# Patient Record
Sex: Female | Born: 1945 | Race: White | Hispanic: No | Marital: Married | Smoking: Never smoker
Health system: Southern US, Community
[De-identification: ages and names within clinical notes are randomized; demographics above are authoritative.]

## PROBLEM LIST (undated history)

## (undated) DIAGNOSIS — R053 Chronic cough: Secondary | ICD-10-CM

## (undated) DIAGNOSIS — L719 Rosacea, unspecified: Secondary | ICD-10-CM

## (undated) DIAGNOSIS — D649 Anemia, unspecified: Secondary | ICD-10-CM

## (undated) DIAGNOSIS — J849 Interstitial pulmonary disease, unspecified: Secondary | ICD-10-CM

## (undated) DIAGNOSIS — E119 Type 2 diabetes mellitus without complications: Secondary | ICD-10-CM

## (undated) DIAGNOSIS — R42 Dizziness and giddiness: Secondary | ICD-10-CM

## (undated) DIAGNOSIS — R112 Nausea with vomiting, unspecified: Secondary | ICD-10-CM

## (undated) DIAGNOSIS — M1990 Unspecified osteoarthritis, unspecified site: Secondary | ICD-10-CM

## (undated) DIAGNOSIS — F32A Depression, unspecified: Secondary | ICD-10-CM

## (undated) DIAGNOSIS — I10 Essential (primary) hypertension: Secondary | ICD-10-CM

## (undated) DIAGNOSIS — E669 Obesity, unspecified: Secondary | ICD-10-CM

## (undated) DIAGNOSIS — R0600 Dyspnea, unspecified: Secondary | ICD-10-CM

## (undated) DIAGNOSIS — J189 Pneumonia, unspecified organism: Secondary | ICD-10-CM

## (undated) DIAGNOSIS — Z9889 Other specified postprocedural states: Secondary | ICD-10-CM

## (undated) HISTORY — PX: CHOLECYSTECTOMY: SHX55

## (undated) HISTORY — PX: HERNIA REPAIR: SHX51

## (undated) HISTORY — PX: TOTAL KNEE ARTHROPLASTY: SHX125

## (undated) HISTORY — PX: OTHER SURGICAL HISTORY: SHX169

## (undated) HISTORY — PX: JOINT REPLACEMENT: SHX530

---

## 1983-02-26 HISTORY — PX: CARPAL TUNNEL RELEASE: SHX101

## 2006-02-25 HISTORY — PX: VENTRAL HERNIA REPAIR: SHX424

## 2008-02-26 HISTORY — PX: LAPAROSCOPIC GASTRIC BANDING: SHX1100

## 2011-12-27 HISTORY — PX: DILATION AND CURETTAGE OF UTERUS: SHX78

## 2012-01-22 ENCOUNTER — Ambulatory Visit: Payer: Self-pay | Admitting: Obstetrics and Gynecology

## 2012-01-22 LAB — BASIC METABOLIC PANEL
Anion Gap: 4 — ABNORMAL LOW (ref 7–16)
BUN: 13 mg/dL (ref 7–18)
Chloride: 100 mmol/L (ref 98–107)
Co2: 32 mmol/L (ref 21–32)
Creatinine: 0.63 mg/dL (ref 0.60–1.30)
EGFR (Non-African Amer.): >60
Osmolality: 276 (ref 275–301)
Potassium: 4 mmol/L (ref 3.5–5.1)
Sodium: 136 mmol/L (ref 136–145)

## 2012-01-22 LAB — CBC
HCT: 44 % (ref 35.0–47.0)
HGB: 14.3 g/dL (ref 12.0–16.0)
MCV: 86 fL (ref 80–100)
Platelet: 228 10*3/uL (ref 150–440)
RBC: 5.15 10*6/uL (ref 3.80–5.20)
RDW: 14.1 % (ref 11.5–14.5)
WBC: 5.2 10*3/uL (ref 3.6–11.0)

## 2012-01-27 ENCOUNTER — Ambulatory Visit: Payer: Self-pay | Admitting: Obstetrics and Gynecology

## 2012-01-29 LAB — PATHOLOGY REPORT

## 2012-02-18 ENCOUNTER — Ambulatory Visit: Payer: Self-pay | Admitting: Gynecologic Oncology

## 2012-02-25 ENCOUNTER — Ambulatory Visit: Payer: Self-pay | Admitting: Internal Medicine

## 2012-02-26 ENCOUNTER — Ambulatory Visit: Payer: Self-pay | Admitting: Gynecologic Oncology

## 2012-10-13 ENCOUNTER — Ambulatory Visit: Payer: Self-pay | Admitting: Surgery

## 2012-10-13 LAB — CREATININE, SERUM: EGFR (Non-African Amer.): >60

## 2012-10-19 ENCOUNTER — Ambulatory Visit: Payer: Self-pay | Admitting: Surgery

## 2012-11-05 DIAGNOSIS — R1033 Periumbilical pain: Secondary | ICD-10-CM | POA: Diagnosis not present

## 2013-01-06 DIAGNOSIS — M1710 Unilateral primary osteoarthritis, unspecified knee: Secondary | ICD-10-CM | POA: Diagnosis not present

## 2013-01-06 DIAGNOSIS — Z23 Encounter for immunization: Secondary | ICD-10-CM | POA: Diagnosis not present

## 2013-01-06 DIAGNOSIS — Z0100 Encounter for examination of eyes and vision without abnormal findings: Secondary | ICD-10-CM | POA: Diagnosis not present

## 2013-01-06 DIAGNOSIS — I10 Essential (primary) hypertension: Secondary | ICD-10-CM | POA: Diagnosis not present

## 2013-01-06 DIAGNOSIS — E119 Type 2 diabetes mellitus without complications: Secondary | ICD-10-CM | POA: Diagnosis not present

## 2013-01-06 DIAGNOSIS — Z0000 Encounter for general adult medical examination without abnormal findings: Secondary | ICD-10-CM | POA: Diagnosis not present

## 2013-09-08 DIAGNOSIS — S335XXA Sprain of ligaments of lumbar spine, initial encounter: Secondary | ICD-10-CM | POA: Diagnosis not present

## 2013-09-08 DIAGNOSIS — E119 Type 2 diabetes mellitus without complications: Secondary | ICD-10-CM | POA: Diagnosis not present

## 2013-09-08 DIAGNOSIS — I10 Essential (primary) hypertension: Secondary | ICD-10-CM | POA: Diagnosis not present

## 2013-09-10 DIAGNOSIS — H2510 Age-related nuclear cataract, unspecified eye: Secondary | ICD-10-CM | POA: Diagnosis not present

## 2013-11-23 ENCOUNTER — Ambulatory Visit: Payer: Self-pay | Admitting: Internal Medicine

## 2013-11-23 DIAGNOSIS — Z1231 Encounter for screening mammogram for malignant neoplasm of breast: Secondary | ICD-10-CM | POA: Diagnosis not present

## 2014-03-11 DIAGNOSIS — N390 Urinary tract infection, site not specified: Secondary | ICD-10-CM | POA: Diagnosis not present

## 2014-03-11 DIAGNOSIS — I10 Essential (primary) hypertension: Secondary | ICD-10-CM | POA: Diagnosis not present

## 2014-03-11 DIAGNOSIS — Z09 Encounter for follow-up examination after completed treatment for conditions other than malignant neoplasm: Secondary | ICD-10-CM | POA: Diagnosis not present

## 2014-03-11 DIAGNOSIS — E1121 Type 2 diabetes mellitus with diabetic nephropathy: Secondary | ICD-10-CM | POA: Diagnosis not present

## 2014-03-11 DIAGNOSIS — Z23 Encounter for immunization: Secondary | ICD-10-CM | POA: Diagnosis not present

## 2014-03-11 DIAGNOSIS — M170 Bilateral primary osteoarthritis of knee: Secondary | ICD-10-CM | POA: Diagnosis not present

## 2014-06-14 NOTE — Op Note (Signed)
PATIENT NAME:  Megan Shields, Megan Shields MR#:  [id number] DATE OF BIRTH:  [birth year]  DATE OF PROCEDURE:  01/27/2012  PREOPERATIVE DIAGNOSIS: Postmenopausal bleeding.   POSTOPERATIVE DIAGNOSES:  1. Postmenopausal bleeding.  2. Submucosal fibroid.   PROCEDURES PERFORMED:  1. Diagnostic hysteroscopy.  2. Dilation and curettage of the endometrium.   SURGEON: Alanda Slim. DeFrancesco, MD   FIRST ASSISTANT: None.   ANESTHESIA: General.   INDICATIONS: The patient is a 69 year old married white female, para 0, with a history of postmenopausal bleeding. Previously the patient had undergone a hysteroscopy with dilation and curettage in [place] for a similar problem with benign pathology findings.   FINDINGS AT SURGERY: White female with BMI of 55. Pelvis was narrow with a narrow pubic arch and convergent sidewalls. Proximal vagina was narrow. Cervix was without lesions. The uterus was midplane and size was difficult to assess due to body habitus. The uterus did sound to 11 cm. There was what appeared to be a submucosal fibroid on hysteroscopy and there was lush endometrium noted behind this submucosal mass.   DESCRIPTION OF PROCEDURE: The patient was brought to the operating room where she was placed in the supine position. General anesthesia was induced without difficulty. She was placed in the dorsal lithotomy position using the bumblebee stirrups. A Betadine perineal, intravaginal prep and drape was performed in the standard fashion. A red Robinson catheter was used to drain 100 mL of urine from the bladder. A Sims speculum was placed into the vagina to facilitate exposure. The anterior lip of the cervix was grasped with a single-tooth tenaculum. The uterus was initially sounded to 6 cm. Hanks dilators up to a #20 [place] caliber were used to dilate the endocervical canal. After completing the cervical dilation, the hysteroscope was placed with lactated Ringer's as irrigant to identify the intrauterine  findings. Eventually there was a central submucosal mass that once gotten around the fundus was seen with some lush endometrium being present. Smooth and serrated curettes were used to curettage the endometrial cavity. Tissue was sent to pathology. Upon completion of the D and C, repeat hysteroscopy revealed no significant tissue left behind with adequate sampling being obtained. The procedure was then terminated with all instrumentation being removed from the vagina. The patient was then awakened, mobilized, and taken to the recovery room in satisfactory condition. Estimated blood loss was minimal. Complications were none. All instruments, needles, and sponge counts were verified as correct.   ____________________________ Alanda Slim. DeFrancesco, MD mad:drc D: 01/27/2012 16:10:55 ET T: 01/28/2012 10:01:39 ET JOB#: [id number]  cc: Hassell Done A. DeFrancesco, MD, <Dictator> [facility] Alanda Slim DEFRANCESCO MD ELECTRONICALLY SIGNED 01/28/2012 23:21

## 2014-07-18 DIAGNOSIS — L718 Other rosacea: Secondary | ICD-10-CM | POA: Diagnosis not present

## 2014-07-18 DIAGNOSIS — M170 Bilateral primary osteoarthritis of knee: Secondary | ICD-10-CM | POA: Diagnosis not present

## 2014-07-18 DIAGNOSIS — L821 Other seborrheic keratosis: Secondary | ICD-10-CM | POA: Diagnosis not present

## 2014-07-18 DIAGNOSIS — M2011 Hallux valgus (acquired), right foot: Secondary | ICD-10-CM | POA: Diagnosis not present

## 2014-07-18 DIAGNOSIS — D225 Melanocytic nevi of trunk: Secondary | ICD-10-CM | POA: Diagnosis not present

## 2014-07-18 DIAGNOSIS — Z872 Personal history of diseases of the skin and subcutaneous tissue: Secondary | ICD-10-CM | POA: Diagnosis not present

## 2014-07-18 DIAGNOSIS — L739 Follicular disorder, unspecified: Secondary | ICD-10-CM | POA: Diagnosis not present

## 2014-07-18 DIAGNOSIS — I10 Essential (primary) hypertension: Secondary | ICD-10-CM | POA: Diagnosis not present

## 2014-07-18 DIAGNOSIS — E1121 Type 2 diabetes mellitus with diabetic nephropathy: Secondary | ICD-10-CM | POA: Diagnosis not present

## 2014-07-18 DIAGNOSIS — Z1283 Encounter for screening for malignant neoplasm of skin: Secondary | ICD-10-CM | POA: Diagnosis not present

## 2014-07-18 DIAGNOSIS — D485 Neoplasm of uncertain behavior of skin: Secondary | ICD-10-CM | POA: Diagnosis not present

## 2014-07-26 DIAGNOSIS — M79671 Pain in right foot: Secondary | ICD-10-CM | POA: Diagnosis not present

## 2014-07-26 DIAGNOSIS — M2011 Hallux valgus (acquired), right foot: Secondary | ICD-10-CM | POA: Diagnosis not present

## 2014-08-01 ENCOUNTER — Other Ambulatory Visit: Payer: Self-pay | Admitting: Internal Medicine

## 2014-08-01 ENCOUNTER — Encounter: Payer: Self-pay | Admitting: Internal Medicine

## 2014-08-01 DIAGNOSIS — Z9884 Bariatric surgery status: Secondary | ICD-10-CM | POA: Insufficient documentation

## 2014-08-01 DIAGNOSIS — M1710 Unilateral primary osteoarthritis, unspecified knee: Secondary | ICD-10-CM | POA: Insufficient documentation

## 2014-08-01 DIAGNOSIS — E1129 Type 2 diabetes mellitus with other diabetic kidney complication: Secondary | ICD-10-CM | POA: Insufficient documentation

## 2014-08-01 DIAGNOSIS — M179 Osteoarthritis of knee, unspecified: Secondary | ICD-10-CM | POA: Insufficient documentation

## 2014-08-01 DIAGNOSIS — S39012A Strain of muscle, fascia and tendon of lower back, initial encounter: Secondary | ICD-10-CM | POA: Insufficient documentation

## 2014-08-01 DIAGNOSIS — I10 Essential (primary) hypertension: Secondary | ICD-10-CM | POA: Insufficient documentation

## 2014-08-04 ENCOUNTER — Encounter: Payer: Self-pay | Admitting: *Deleted

## 2014-08-10 ENCOUNTER — Encounter
Admission: RE | Admit: 2014-08-10 | Discharge: 2014-08-10 | Disposition: A | Discharge status: Home / Self Care | Payer: Medicare Other | Source: Ambulatory Visit | Attending: Podiatry | Admitting: Podiatry

## 2014-08-10 DIAGNOSIS — Z01810 Encounter for preprocedural cardiovascular examination: Secondary | ICD-10-CM | POA: Diagnosis not present

## 2014-08-10 DIAGNOSIS — I10 Essential (primary) hypertension: Secondary | ICD-10-CM | POA: Diagnosis not present

## 2014-08-10 DIAGNOSIS — M2011 Hallux valgus (acquired), right foot: Secondary | ICD-10-CM | POA: Diagnosis not present

## 2014-08-10 DIAGNOSIS — Z01812 Encounter for preprocedural laboratory examination: Secondary | ICD-10-CM | POA: Insufficient documentation

## 2014-08-10 DIAGNOSIS — R030 Elevated blood-pressure reading, without diagnosis of hypertension: Secondary | ICD-10-CM | POA: Diagnosis not present

## 2014-08-10 HISTORY — DX: Obesity, unspecified: E66.9

## 2014-08-10 LAB — BASIC METABOLIC PANEL
ANION GAP: 9 (ref 5–15)
BUN: 16 mg/dL (ref 6–20)
CHLORIDE: 100 mmol/L — AB (ref 101–111)
CO2: 30 mmol/L (ref 22–32)
CREATININE: 0.86 mg/dL (ref 0.44–1.00)
Calcium: 9.5 mg/dL (ref 8.9–10.3)
GFR calc Af Amer: >60 mL/min (ref >60)
GFR calc non Af Amer: >60 mL/min (ref >60)
GLUCOSE: 140 mg/dL — AB (ref 65–99)
Potassium: 3.8 mmol/L (ref 3.5–5.1)
SODIUM: 139 mmol/L (ref 135–145)

## 2014-08-10 NOTE — Patient Instructions (Signed)
  Your procedure is scheduled on: 09/02/14 Fri    Report to Day Surgery. To find out your arrival time please call [phone] between 1PM - 3PM on 09/01/14 Thurs.  Remember: Instructions that are not followed completely may result in serious medical risk, up to and including death, or upon the discretion of your surgeon and anesthesiologist your surgery may need to be rescheduled.    _x___ 1. Do not eat food or drink liquids after midnight. No gum chewing or hard candies.     ____ 2. No Alcohol for 24 hours before or after surgery.   ____ 3. Bring all medications with you on the day of surgery if instructed.    __x__ 4. Notify your doctor if there is any change in your medical condition     (cold, fever, infections).     Do not wear jewelry, make-up, hairpins, clips or nail polish.  Do not wear lotions, powders, or perfumes. You may wear deodorant.  Do not shave 48 hours prior to surgery. Men may shave face and neck.  Do not bring valuables to the hospital.    [facility] is not responsible for any belongings or valuables.               Contacts, dentures or bridgework may not be worn into surgery.  Leave your suitcase in the car. After surgery it may be brought to your room.  For patients admitted to the hospital, discharge time is determined by your                treatment team.   Patients discharged the day of surgery will not be allowed to drive home.   Please read over the following fact sheets that you were given:     _ Take these medicines the morning of surgery with A SIP OF WATER:    1.   2.   3.   4.  5.  6.  ____ Fleet Enema (as directed)   __x__ Use CHG Soap as directed  ____ Use inhalers on the day of surgery  _x___ Stop metformin 2 days prior to surgery    ____ Take 1/2 of usual insulin dose the night before surgery and none on the morning of surgery.   ____ Stop Coumadin/Plavix/aspirin on   ____ Stop Anti-inflammatories on    ____ Stop supplements  until after surgery.    ____ Bring C-Pap to the hospital.

## 2014-08-11 NOTE — OR Nursing (Signed)
Ekg reviewed by Dr Myra Gianotti and ok

## 2014-08-17 ENCOUNTER — Ambulatory Visit: Admission: RE | Admit: 2014-08-17 | Payer: Medicare Other | Source: Ambulatory Visit | Admitting: Podiatry

## 2014-08-17 HISTORY — DX: Type 2 diabetes mellitus without complications: E11.9

## 2014-08-17 HISTORY — DX: Unspecified osteoarthritis, unspecified site: M19.90

## 2014-08-17 HISTORY — DX: Other specified postprocedural states: Z98.890

## 2014-08-17 HISTORY — DX: Nausea with vomiting, unspecified: R11.2

## 2014-08-17 HISTORY — DX: Essential (primary) hypertension: I10

## 2014-08-17 SURGERY — CORRECTION, HALLUX VALGUS
Anesthesia: Regional | Laterality: Right

## 2014-09-02 ENCOUNTER — Ambulatory Visit
Admission: RE | Admit: 2014-09-02 | Discharge: 2014-09-02 | Disposition: A | Discharge status: Home / Self Care | Payer: Medicare Other | Source: Ambulatory Visit | Attending: Podiatry | Admitting: Podiatry

## 2014-09-02 ENCOUNTER — Ambulatory Visit: Payer: Medicare Other | Admitting: Anesthesiology

## 2014-09-02 ENCOUNTER — Encounter
Admission: RE | Disposition: A | Discharge status: Home / Self Care | Payer: Self-pay | Source: Ambulatory Visit | Attending: Podiatry

## 2014-09-02 ENCOUNTER — Encounter: Payer: Self-pay | Admitting: *Deleted

## 2014-09-02 DIAGNOSIS — I10 Essential (primary) hypertension: Secondary | ICD-10-CM | POA: Insufficient documentation

## 2014-09-02 DIAGNOSIS — N289 Disorder of kidney and ureter, unspecified: Secondary | ICD-10-CM | POA: Insufficient documentation

## 2014-09-02 DIAGNOSIS — E669 Obesity, unspecified: Secondary | ICD-10-CM | POA: Diagnosis not present

## 2014-09-02 DIAGNOSIS — Z6841 Body Mass Index (BMI) 40.0 and over, adult: Secondary | ICD-10-CM | POA: Insufficient documentation

## 2014-09-02 DIAGNOSIS — M2011 Hallux valgus (acquired), right foot: Secondary | ICD-10-CM | POA: Insufficient documentation

## 2014-09-02 DIAGNOSIS — E119 Type 2 diabetes mellitus without complications: Secondary | ICD-10-CM | POA: Insufficient documentation

## 2014-09-02 DIAGNOSIS — M79671 Pain in right foot: Secondary | ICD-10-CM | POA: Diagnosis not present

## 2014-09-02 DIAGNOSIS — G8918 Other acute postprocedural pain: Secondary | ICD-10-CM | POA: Diagnosis not present

## 2014-09-02 HISTORY — PX: HALLUX VALGUS AUSTIN: SHX6623

## 2014-09-02 LAB — GLUCOSE, CAPILLARY
Glucose-Capillary: 128 mg/dL — ABNORMAL HIGH (ref 65–99)
Glucose-Capillary: 168 mg/dL — ABNORMAL HIGH (ref 65–99)

## 2014-09-02 SURGERY — CORRECTION, HALLUX VALGUS
Anesthesia: Regional | Laterality: Right

## 2014-09-02 MED ORDER — ROPIVACAINE HCL 5 MG/ML IJ SOLN
INTRAMUSCULAR | Status: DC | PRN
  Administered 2014-09-02: 10 mL via EPIDURAL
  Administered 2014-09-02 (×2): 5 mL via EPIDURAL

## 2014-09-02 MED ORDER — BUPIVACAINE HCL (PF) 0.5 % IJ SOLN
INTRAMUSCULAR | Status: AC
  Filled 2014-09-02: qty 30

## 2014-09-02 MED ORDER — OXYCODONE HCL 5 MG PO TABS
5.0000 mg | ORAL_TABLET | Freq: Once | ORAL | Status: AC | PRN
  Administered 2014-09-02: 5 mg via ORAL

## 2014-09-02 MED ORDER — BUPIVACAINE HCL (PF) 0.25 % IJ SOLN
INTRAMUSCULAR | Status: AC
  Filled 2014-09-02: qty 30

## 2014-09-02 MED ORDER — CEFAZOLIN SODIUM-DEXTROSE 2-3 GM-% IV SOLR
2.0000 g | Freq: Once | INTRAVENOUS | Status: AC
  Administered 2014-09-02: 2 g via INTRAVENOUS

## 2014-09-02 MED ORDER — ONDANSETRON HCL 4 MG/2ML IJ SOLN
INTRAMUSCULAR | Status: DC | PRN
  Administered 2014-09-02: 4 mg via INTRAVENOUS

## 2014-09-02 MED ORDER — BUPIVACAINE HCL 0.25 % IJ SOLN
INTRAMUSCULAR | Status: DC | PRN
  Administered 2014-09-02: 10 mL

## 2014-09-02 MED ORDER — ROPIVACAINE HCL 5 MG/ML IJ SOLN
INTRAMUSCULAR | Status: AC
  Filled 2014-09-02: qty 40

## 2014-09-02 MED ORDER — OXYCODONE HCL 5 MG/5ML PO SOLN: 5.0000 mg | Freq: Once | ORAL | Status: AC | PRN

## 2014-09-02 MED ORDER — OXYCODONE HCL 5 MG PO TABS
ORAL_TABLET | ORAL | Status: AC
  Filled 2014-09-02: qty 1

## 2014-09-02 MED ORDER — DEXAMETHASONE SODIUM PHOSPHATE 4 MG/ML IJ SOLN
INTRAMUSCULAR | Status: DC | PRN
  Administered 2014-09-02: 10 mg via INTRAVENOUS

## 2014-09-02 MED ORDER — PHENYLEPHRINE HCL 10 MG/ML IJ SOLN
INTRAMUSCULAR | Status: DC | PRN
  Administered 2014-09-02 (×2): 100 ug via INTRAVENOUS

## 2014-09-02 MED ORDER — HYDROCODONE-ACETAMINOPHEN 5-325 MG PO TABS: 1.0000 | ORAL_TABLET | Freq: Four times a day (QID) | ORAL | Status: DC | PRN

## 2014-09-02 MED ORDER — SODIUM CHLORIDE 0.9 % IR SOLN
Status: DC | PRN
  Administered 2014-09-02: 200 mL

## 2014-09-02 MED ORDER — FENTANYL CITRATE (PF) 100 MCG/2ML IJ SOLN
25.0000 ug | INTRAMUSCULAR | Status: DC | PRN
  Administered 2014-09-02: 50 ug via INTRAVENOUS
  Administered 2014-09-02 (×2): 25 ug via INTRAVENOUS

## 2014-09-02 MED ORDER — SODIUM CHLORIDE 0.9 % IV SOLN
INTRAVENOUS | Status: DC
  Administered 2014-09-02: 07:00:00 via INTRAVENOUS

## 2014-09-02 MED ORDER — MIDAZOLAM HCL 2 MG/2ML IJ SOLN
INTRAMUSCULAR | Status: DC | PRN
  Administered 2014-09-02 (×2): 1 mg via INTRAVENOUS

## 2014-09-02 MED ORDER — HYDROCODONE-ACETAMINOPHEN 5-325 MG PO TABS
ORAL_TABLET | ORAL | Status: AC
  Filled 2014-09-02: qty 1

## 2014-09-02 MED ORDER — FAMOTIDINE 20 MG PO TABS
20.0000 mg | ORAL_TABLET | Freq: Once | ORAL | Status: AC
  Administered 2014-09-02: 20 mg via ORAL

## 2014-09-02 MED ORDER — PROPOFOL 10 MG/ML IV BOLUS
INTRAVENOUS | Status: DC | PRN
  Administered 2014-09-02: 200 mg via INTRAVENOUS

## 2014-09-02 MED ORDER — SUCCINYLCHOLINE CHLORIDE 20 MG/ML IJ SOLN
INTRAMUSCULAR | Status: DC | PRN
  Administered 2014-09-02: 200 mg via INTRAVENOUS

## 2014-09-02 SURGICAL SUPPLY — 48 items
0.062 k- wire ×2 IMPLANT
BANDAGE ELASTIC 4 CLIP NS LF (GAUZE/BANDAGES/DRESSINGS) ×2 IMPLANT
BANDAGE GZE STRCH 3 X5YDS N/ST (GAUZE/BANDAGES/DRESSINGS) ×2 IMPLANT
BANDAGE STRETCH 3X4.1 STRL (GAUZE/BANDAGES/DRESSINGS) ×2 IMPLANT
BLADE MED AGGRESSIVE (BLADE) ×2 IMPLANT
BLADE OSC/SAGITTAL MD 5.5X18 (BLADE) IMPLANT
BLADE OSC/SAGITTAL MD 9X18.5 (BLADE) IMPLANT
BLADE OSCILLATING/SAGITTAL (BLADE)
BLADE SURG 15 STRL LF DISP TIS (BLADE) ×2 IMPLANT
BLADE SURG 15 STRL SS (BLADE) ×2
BLADE SURG MINI STRL (BLADE) ×2 IMPLANT
BLADE SW THK.38XMED LNG THN (BLADE) IMPLANT
BNDG ESMARK 4X12 TAN STRL LF (GAUZE/BANDAGES/DRESSINGS) ×2 IMPLANT
BNDG STRETCH 4X75 STRL LF (GAUZE/BANDAGES/DRESSINGS) ×2 IMPLANT
CANISTER SUCT 1200ML W/VALVE (MISCELLANEOUS) ×2 IMPLANT
CHLORAPREP   10.5 W/. TINT ×2 IMPLANT
DRAPE FLUOR MINI C-ARM 54X84 (DRAPES) ×2 IMPLANT
DURAPREP 26ML APPLICATOR (WOUND CARE) ×2 IMPLANT
GAUZE PETRO XEROFOAM 1X8 (MISCELLANEOUS) ×2 IMPLANT
GAUZE SPONGE 4X4 12PLY STRL (GAUZE/BANDAGES/DRESSINGS) ×2 IMPLANT
GAUZE XEROFORM 4X4 STRL (GAUZE/BANDAGES/DRESSINGS) ×2 IMPLANT
GLOVE BIO SURGEON STRL SZ7.5 (GLOVE) ×8 IMPLANT
GLOVE INDICATOR 8.0 STRL GRN (GLOVE) ×4 IMPLANT
GOWN STRL REUS W/ TWL LRG LVL3 (GOWN DISPOSABLE) ×2 IMPLANT
GOWN STRL REUS W/TWL LRG LVL3 (GOWN DISPOSABLE) ×2
KIT RM TURNOVER STRD PROC AR (KITS) ×2 IMPLANT
LABEL OR SOLS (LABEL) ×2 IMPLANT
MONOCRYL ×2 IMPLANT
NDL SAFETY 25GX1.5 (NEEDLE) IMPLANT
NS IRRIG 500ML POUR BTL (IV SOLUTION) ×2 IMPLANT
PAD GROUND ADULT SPLIT (MISCELLANEOUS) ×2 IMPLANT
PENCIL ELECTRO HAND CTR (MISCELLANEOUS) IMPLANT
PREP CHG 10.5 TEAL (MISCELLANEOUS) ×2 IMPLANT
RASP SM TEAR CROSS CUT (RASP) ×2 IMPLANT
SPLINT CAST 1 STEP 5X30 WHT (MISCELLANEOUS) ×2 IMPLANT
STOCKINETTE M/LG 89821 (MISCELLANEOUS) ×2 IMPLANT
STOCKINETTE STRL 6IN 960660 (GAUZE/BANDAGES/DRESSINGS) IMPLANT
STRIP CLOSURE SKIN 1/4X4 (GAUZE/BANDAGES/DRESSINGS) ×2 IMPLANT
SUT ETHILON 5-0 FS-2 18 BLK (SUTURE) ×2 IMPLANT
SUT MNCRL+ 5-0 UNDYED PC-3 (SUTURE) ×1 IMPLANT
SUT MONOCRYL 5-0 (SUTURE) ×1
SUT VIC AB 4-0 FS2 27 (SUTURE) ×2 IMPLANT
SUT VICRYL+ 3-0 27IN RB-1 (SUTURE) ×2 IMPLANT
SWABSTK COMLB BENZOIN TINCTURE (MISCELLANEOUS) ×2 IMPLANT
SYRINGE 10CC LL (SYRINGE) IMPLANT
VICRYL  VCP 215 ×2 IMPLANT
WIRE Z .045 C-WIRE SPADE TIP (WIRE) ×2 IMPLANT
WIRE Z .062 C-WIRE SPADE TIP (WIRE) ×2 IMPLANT

## 2014-09-02 NOTE — Anesthesia Preprocedure Evaluation (Signed)
Anesthesia Evaluation  Patient identified by MRN, date of birth, ID band Patient awake    Reviewed: Allergy & Precautions, H&P , NPO status , Patient's Chart, lab work & pertinent test results, reviewed documented beta blocker date and time   History of Anesthesia Complications (+) PONV and history of anesthetic complications  Airway Mallampati: II  TM Distance: >3 FB Neck ROM: full    Dental  (+) Chipped   Pulmonary neg pulmonary ROS, neg COPD breath sounds clear to auscultation  Pulmonary exam normal       Cardiovascular Exercise Tolerance: Good hypertension, - Past MI Normal cardiovascular examRhythm:regular Rate:Normal     Neuro/Psych  Neuromuscular disease negative psych ROS   GI/Hepatic negative GI ROS, Neg liver ROS,   Endo/Other  diabetes, Well Controlled, Type 2, Oral Hypoglycemic Agents  Renal/GU Renal disease  negative genitourinary   Musculoskeletal negative musculoskeletal ROS (+)   Abdominal   Peds negative pediatric ROS (+)  Hematology negative hematology ROS (+)   Anesthesia Other Findings Past Medical History:   Hypertension                                                 Arthritis                                                    PONV (postoperative nausea and vomiting)                     Diabetes mellitus without complication                       Obesity                                                      Reproductive/Obstetrics negative OB ROS                             Anesthesia Physical Anesthesia Plan  ASA: III  Anesthesia Plan: General LMA and Regional   Post-op Pain Management: MAC Combined w/ Regional for Post-op pain   Induction:   Airway Management Planned:   Additional Equipment:   Intra-op Plan:   Post-operative Plan:   Informed Consent: I have reviewed the patients History and Physical, chart, labs and discussed the procedure including  the risks, benefits and alternatives for the proposed anesthesia with the patient or authorized representative who has indicated his/her understanding and acceptance.   Dental Advisory Given  Plan Discussed with: Anesthesiologist, CRNA and Surgeon  Anesthesia Plan Comments:         Anesthesia Quick Evaluation

## 2014-09-02 NOTE — Anesthesia Procedure Notes (Addendum)
Procedure Name: LMA Insertion Date/Time: 09/02/2014 7:37 AM Performed by: Silvana Newness Pre-anesthesia Checklist: Patient identified, Emergency Drugs available, Suction available, Patient being monitored and Timeout performed Patient Re-evaluated:Patient Re-evaluated prior to inductionOxygen Delivery Method: Circle system utilized Preoxygenation: Pre-oxygenation with 100% oxygen Intubation Type: IV induction Ventilation: Mask ventilation without difficulty LMA: LMA inserted LMA Size: 4.0 Number of attempts: 1 Placement Confirmation: positive ETCO2 and breath sounds checked- equal and bilateral Tube secured with: Tape Dental Injury: Teeth and Oropharynx as per pre-operative assessment  Comments: LMA removed at 0740 due to not reaching adequate tidal volumes.    Procedure Name: Intubation Date/Time: 09/02/2014 7:41 AM Performed by: Silvana Newness Pre-anesthesia Checklist: Patient identified, Emergency Drugs available, Suction available, Patient being monitored and Timeout performed Patient Re-evaluated:Patient Re-evaluated prior to inductionOxygen Delivery Method: Circle system utilized Preoxygenation: Pre-oxygenation with 100% oxygen Intubation Type: IV induction Ventilation: Mask ventilation without difficulty Laryngoscope Size: Mac and 3 Grade View: Grade I Tube type: Oral Tube size: 7.0 mm Number of attempts: 1 Placement Confirmation: ETT inserted through vocal cords under direct vision,  positive ETCO2 and breath sounds checked- equal and bilateral Secured at: 21 cm Tube secured with: Tape Dental Injury: Teeth and Oropharynx as per pre-operative assessment     Anesthesia Regional Block:  Popliteal block  Pre-Anesthetic Checklist: ,, timeout performed, Correct Patient, Correct Site, Correct Laterality, Correct Procedure, Correct Position, site marked, Risks and benefits discussed,  Surgical consent,  Pre-op evaluation,  At surgeon's request and post-op pain  management  Laterality: Right and Lower  Prep: chloraprep       Needles:  Injection technique: Single-shot  Needle Type: Echogenic Needle     Needle Length: 9cm 9 cm Needle Gauge: 21 and 21 G    Additional Needles:  Procedures: nerve stimulator Popliteal block Narrative:  Start time: 09/02/2014 7:25 AM End time: 09/02/2014 7:32 AM Injection made incrementally with aspirations every 5 mL.  Performed by: Personally  Anesthesiologist: Katy Fitch K  Additional Notes: Ultrasound guided, picture not saved, nerve stimulator used to verify no motor response with stimulation less than 0.5 mA.  No paresthesia during procedure.  Patient tolerated procedure well with no immediate complications.

## 2014-09-02 NOTE — Anesthesia Postprocedure Evaluation (Signed)
  Anesthesia Post-op Note  Patient: Megan Shields  Procedure(s) Performed: Procedure(s): HALLUX VALGUS AUSTIN (Right)  Anesthesia type:General LMA, Regional  Patient location: PACU  Post pain: Pain level controlled  Post assessment: Post-op Vital signs reviewed, Patient's Cardiovascular Status Stable, Respiratory Function Stable, Patent Airway and No signs of Nausea or vomiting  Post vital signs: Reviewed and stable  Last Vitals:  Filed Vitals:   09/02/14 0848  BP: 148/90  Pulse: 79  Temp: 37.3 C  Resp: 19    Level of consciousness: awake, alert  and patient cooperative  Complications: No apparent anesthesia complications

## 2014-09-02 NOTE — Discharge Instructions (Addendum)
[place] [address] [place]   1. Take your medication as prescribed.  Pain medication should be taken only as needed.  2. Keep the dressing clean, dry and intact.  3. Keep your foot elevated above the heart level for the first 48 hours.  4. Walking to the bathroom and brief periods of walking are acceptable, unless we have instructed you to be non-weight bearing.  5. Always wear your post-op shoe when walking.  Always use your crutches if you are to be non-weight bearing.  6. Do not take a shower. Baths are permissible as long as the foot is kept out of the water.   7. Every hour you are awake:  - Bend your knee 15 times. - Flex foot 15 times - Massage calf 15 times  8. Call [facility] [phone]) if any of the following problems occur: - You develop a temperature or fever. - The bandage becomes saturated with blood. - Medication does not stop your pain. - Injury of the foot occurs. - Any symptoms of infection including redness, odor, or red streaks running from wound.        9.   If your were given a popliteal block (numbing from the knee down) you should use a cane or walker and avoid weight to the operative leg until feeling has returned to you foot. AMBULATORY SURGERY  DISCHARGE INSTRUCTIONS   1) The drugs that you were given will stay in your system until tomorrow so for the next 24 hours you should not:  A) Drive an automobile B) Make any legal decisions C) Drink any alcoholic beverage   2) You may resume regular meals tomorrow.  Today it is better to start with liquids and gradually work up to solid foods.  You may eat anything you prefer, but it is better to start with liquids, then soup and crackers, and gradually work up to solid foods.   3) Please notify your doctor immediately if you have any unusual bleeding, trouble breathing,  redness and pain at the surgery site, drainage, fever, or pain not relieved by medication.    4) Additional Instructions:  Please contact your physician with any problems or Same Day Surgery at [phone], Monday through Friday 6 am to 4 pm, or [place] at [facility] number at [phone].

## 2014-09-02 NOTE — H&P (Signed)
HISTORY AND PHYSICAL INTERVAL NOTE:  09/02/2014  7:40 AM  Megan Shields  has presented today for surgery, with the diagnosis of Julian.  The various methods of treatment have been discussed with the patient.  No guarantees were given.  After consideration of risks, benefits and other options for treatment, the patient has consented to surgery.  I have reviewed the patients' chart and labs.    Patient Vitals for the past 24 hrs:  BP Temp Temp src Pulse Resp SpO2 Height Weight  09/02/14 0626 (!) 145/83 mmHg 98.1 F (36.7 C) Oral 70 16 99 % '5\' 4"'$  (1.626 m) 136.079 kg (300 lb)    A history and physical examination was performed in my office.  The patient was reexamined.  There have been no changes to this history and physical examination.  Samara Deist A

## 2014-09-02 NOTE — Transfer of Care (Signed)
Immediate Anesthesia Transfer of Care Note  Patient: Megan Shields  Procedure(s) Performed: Procedure(s): HALLUX VALGUS AUSTIN (Right)  Patient Location: PACU  Anesthesia Type:General and Regional  Level of Consciousness: awake, alert , oriented and patient cooperative  Airway & Oxygen Therapy: Patient Spontanous Breathing and Patient connected to face mask oxygen  Post-op Assessment: Report given to RN, Post -op Vital signs reviewed and stable and Patient moving all extremities X 4  Post vital signs: Reviewed and stable  Last Vitals:  Filed Vitals:   09/02/14 0848  BP: 148/90  Pulse: 79  Temp: 37.3 C  Resp: 19    Complications: No apparent anesthesia complications

## 2014-09-02 NOTE — Op Note (Signed)
Operative note   Surgeon:Rayona Sardinha Lawyer: None    Preop diagnosis: Hallux valgus right foot    Postop diagnosis: Same    Procedure: [place] hallux valgus correction right foot    EBL: Minimal    Anesthesia:regional and general    Hemostasis: Ankle tourniquet inflated to 250 mmHg for 45 minutes    Specimen: None    Complications: None    Operative indications: 69 year old female with complaint of a painful hallux valgus deformity on her right foot. Undergone conservative treatment and presents today for surgery. The risks benefits alternatives and competitions associated have been discussed and the patient has consented for surgery.    Procedure:  Patient was brought into the OR and placed on the operating table in thesupine position. After anesthesia was obtained theright lower extremity was prepped and draped in usual sterile fashion.  Attention was directed to the dorsal medial first MTPJ where a an incision was performed. Sharp and blunt dissection was carried down to the capsule. A T capsulotomy was then performed. The prominent dorsal medial eminence was noted and transected with a power saw. The joint was evaluated on the head of the metatarsal and base of the proximal phalanx. No severe articular damage was noted. No severe spurring was noted with the exception of a mild dorsal spur on the first metatarsal head. This was removed with a rongeur and smoothed with a power rasp. Care was taken to evaluate the lateral aspect of the MTPJ and no obvious spurring was noted. Next a V osteotomy was created in the head of the metatarsal was translocated laterally. Then stabilized with a 0.062 K wire. Good alignment was noted on fluoroscopy. The K wire was cut and bent appropriately. The ensuing overhanging ledge was then transected. Areas were smoothed with a power rasp. Layered closure was performed with a 3-0 Vicryl with a small capsulorrhaphy to the medial aspect. 4-0 Vicryl for  the subcutaneous tissue and a 5-0 Monocryl for skin. 0.25% Marcaine was then infiltrated around the lesion site. Overall the patient tolerated the procedure and anesthesia well. She was transported from the OR to the PACU with all vital signs stable and vascular status intact. She'll be discharged in an OrthoWedge shoe. Her in the outpatient clinic in 5-7 days.    Patient tolerated the procedure and anesthesia well.  Was transported from the OR to the PACU with all vital signs stable and vascular status intact. To be discharged per routine protocol.  Will follow up in approximately 1 week in the outpatient clinic.

## 2014-09-07 DIAGNOSIS — M79671 Pain in right foot: Secondary | ICD-10-CM | POA: Diagnosis not present

## 2014-09-12 DIAGNOSIS — M79671 Pain in right foot: Secondary | ICD-10-CM | POA: Diagnosis not present

## 2014-10-19 DIAGNOSIS — M79671 Pain in right foot: Secondary | ICD-10-CM | POA: Diagnosis not present

## 2014-10-21 DIAGNOSIS — M79671 Pain in right foot: Secondary | ICD-10-CM | POA: Diagnosis not present

## 2014-10-21 DIAGNOSIS — T8584XD Pain due to internal prosthetic devices, implants and grafts, not elsewhere classified, subsequent encounter: Secondary | ICD-10-CM | POA: Diagnosis not present

## 2014-10-30 DIAGNOSIS — M62838 Other muscle spasm: Secondary | ICD-10-CM | POA: Diagnosis not present

## 2014-11-18 ENCOUNTER — Ambulatory Visit: Payer: Self-pay | Admitting: Internal Medicine

## 2014-12-19 DIAGNOSIS — M79671 Pain in right foot: Secondary | ICD-10-CM | POA: Diagnosis not present

## 2015-01-23 ENCOUNTER — Encounter: Payer: Self-pay | Admitting: Internal Medicine

## 2015-01-23 ENCOUNTER — Ambulatory Visit (INDEPENDENT_AMBULATORY_CARE_PROVIDER_SITE_OTHER): Payer: Medicare Other | Admitting: Internal Medicine

## 2015-01-23 VITALS — BP 128/80 | HR 72 | Wt 306.0 lb

## 2015-01-23 DIAGNOSIS — B373 Candidiasis of vulva and vagina: Secondary | ICD-10-CM

## 2015-01-23 DIAGNOSIS — B3731 Acute candidiasis of vulva and vagina: Secondary | ICD-10-CM

## 2015-01-23 DIAGNOSIS — S39012D Strain of muscle, fascia and tendon of lower back, subsequent encounter: Secondary | ICD-10-CM

## 2015-01-23 MED ORDER — FLUCONAZOLE 150 MG PO TABS: 150.0000 mg | ORAL_TABLET | Freq: Once | ORAL | Status: DC

## 2015-01-23 MED ORDER — CYCLOBENZAPRINE HCL 10 MG PO TABS: 10.0000 mg | ORAL_TABLET | Freq: Every day | ORAL | Status: DC

## 2015-01-23 NOTE — Progress Notes (Signed)
Date:  01/23/2015   Name:  Megan Shields   DOB:  1946-01-30   MRN:  [id number]   Chief Complaint: Rash  patient complains of a rash very pruritic in the groin and labia. She spent a day shopping wearing a panty liner. That evening she noticed some mild itching and woke up in the night scratching. Since then the itching has progressed with spreading redness over her groin and inner thighs labia and mons pubis. She is been using over-the-counter clotrimazole cream with some benefit. She denies vaginal itching or bleeding. No dysuria. No previous history of yeast infections.   Review of Systems  Constitutional: Negative for fever and chills.  Genitourinary: Negative for dysuria, vaginal bleeding, vaginal pain and pelvic pain.  Skin: Positive for color change and rash.    Patient Active Problem List   Diagnosis Date Noted  . Type II diabetes mellitus with renal manifestations ([place]) 08/01/2014  . Essential (primary) hypertension 08/01/2014  . 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 Arthritis of knee, degenerative 08/01/2014  . Low back strain 08/01/2014    Prior to Admission medications   Medication Sig Start Date End Date Taking? Authorizing Provider  acetaminophen (TYLENOL) 500 MG tablet Take 1,000 mg by mouth at bedtime as needed.    Historical Provider, MD  celecoxib (CELEBREX) 200 MG capsule Take 1 capsule by mouth daily. AM 03/11/14   Historical Provider, MD  cyclobenzaprine (FLEXERIL) 10 MG tablet Take 1 tablet by mouth at bedtime. 09/08/13   Historical Provider, MD  glucose blood test strip Place 1 each onto the skin 2 (two) times daily. 12/03/13   Historical Provider, MD  HYDROcodone-acetaminophen (NORCO) 5-325 MG per tablet Take 1 tablet by mouth every 6 (six) hours as needed for moderate pain. 09/02/14   Samara Deist, DPM  metFORMIN (GLUCOPHAGE) 500 MG tablet Take 1 tablet by mouth 2 (two) times daily. 09/16/13   Historical Provider, MD  nystatin cream (MYCOSTATIN) Apply 1 application  topically 2 (two) times daily. 01/06/13   Historical Provider, MD  valsartan-hydrochlorothiazide (DIOVAN-HCT) 160-25 MG per tablet Take 1 tablet by mouth daily. AM 05/04/14   Historical Provider, MD    Allergies  Allergen Reactions  . Codeine     Other reaction(s): drowsiness    Past Surgical History  Procedure Laterality Date  . Laparoscopic gastric banding  2010    Lap Band  . Ventral hernia repair  2008  . Cholecystectomy    . Total knee arthroplasty Bilateral 2003, 2004  . Carpal tunnel release Bilateral 1985  . Dilation and curettage of uterus  12/2011    benign endometrial polyp  . Hernia repair    . Lymph node removed Right   . Total knee arthroplasty Bilateral   . Hallux valgus [place] Right 09/02/2014    Procedure: HALLUX VALGUS AUSTIN;  Surgeon: Samara Deist, DPM;  Location: [facility] ORS;  Service: Podiatry;  Laterality: Right;    Social History  Substance Use Topics  . Smoking status: Never Smoker   . Smokeless tobacco: None  . Alcohol Use: No    Medication list has been reviewed and updated.   Physical Exam  Constitutional: She is oriented to person, place, and time. She appears well-developed and well-nourished. No distress.  HENT:  Head: Normocephalic and atraumatic.  Eyes: Right eye exhibits no discharge. Left eye exhibits no discharge. No scleral icterus.  Pulmonary/Chest: Effort normal. No respiratory distress.  Abdominal: Soft.  Genitourinary:     Musculoskeletal: Normal range of motion.  Neurological: She is alert and oriented to person, place, and time.  Skin: Skin is warm and dry. No rash noted.  Psychiatric: She has a normal mood and affect. Her behavior is normal. Thought content normal.  Nursing note and vitals reviewed.   BP 128/80 mmHg  Pulse 72  Wt 306 lb (138.801 kg)  Assessment and Plan: 1. Yeast vaginitis Continue Clotrimazole; Benedryl at night to reduce itching - fluconazole (DIFLUCAN) 150 MG tablet; Take 1 tablet (150 mg total) by  mouth once.  Dispense: 7 tablet; Refill: 0  2. Low back strain, subsequent encounter Continue flexeril PRN - cyclobenzaprine (FLEXERIL) 10 MG tablet; Take 1 tablet (10 mg total) by mouth at bedtime.  Dispense: 30 tablet; Refill: [place], MD [facility]  01/23/2015

## 2015-02-27 ENCOUNTER — Encounter: Payer: Self-pay | Admitting: *Deleted

## 2015-02-27 ENCOUNTER — Ambulatory Visit
Admission: EM | Admit: 2015-02-27 | Discharge: 2015-02-27 | Disposition: A | Discharge status: Home / Self Care | Payer: Medicare Other | Attending: Family Medicine | Admitting: Family Medicine

## 2015-02-27 DIAGNOSIS — R21 Rash and other nonspecific skin eruption: Secondary | ICD-10-CM

## 2015-02-27 MED ORDER — PREDNISONE 20 MG PO TABS: 20.0000 mg | ORAL_TABLET | Freq: Every day | ORAL | Status: DC

## 2015-02-27 NOTE — ED Provider Notes (Signed)
CSN: [id number]     Arrival date & time 02/27/15  1516 History   First MD Initiated Contact with Patient 02/27/15 1752     Chief Complaint  Patient presents with  . Rash   (Consider location/radiation/quality/duration/timing/severity/associated sxs/prior Treatment) HPI Comments: 70 yo female with a 6 days h/o a rash that started on her anterior upper neck and seems to be worsening. Denies any wheezing, shortness of breath, facial swelling, tongue or throat swelling. States rash feels like its burning and slightly itchy. Denies using new cosmetics, soaps, detergents, plant contact, travel. States only new medication was amoxicillin about 2 weeks ago (2gm prior to dental procedure) and again about 8 days ago prior to second dental procedure. Rash started shortly after the second dose.   The history is provided by the patient.    Past Medical History  Diagnosis Date  . Hypertension   . Arthritis   . PONV (postoperative nausea and vomiting)   . Diabetes mellitus without complication ([place])   . Obesity    Past Surgical History  Procedure Laterality Date  . Laparoscopic gastric banding  2010    Lap Band  . Ventral hernia repair  2008  . Cholecystectomy    . Total knee arthroplasty Bilateral 2003, 2004  . Carpal tunnel release Bilateral 1985  . Dilation and curettage of uterus  12/2011    benign endometrial polyp  . Hernia repair    . Lymph node removed Right   . Total knee arthroplasty Bilateral   . Hallux valgus austin Right 09/02/2014    Procedure: HALLUX VALGUS AUSTIN;  Surgeon: Samara Deist, DPM;  Location: [facility] ORS;  Service: Podiatry;  Laterality: Right;   Family History  Problem Relation Age of Onset  . Breast cancer Mother    Social History  Substance Use Topics  . Smoking status: Never Smoker   . Smokeless tobacco: None  . Alcohol Use: No   OB History    No data available     Review of Systems  Allergies  Pneumococcal vaccines and Codeine  Home Medications    Prior to Admission medications   Medication Sig Start Date End Date Taking? Authorizing Provider  acetaminophen (TYLENOL) 500 MG tablet Take 1,000 mg by mouth at bedtime as needed.   Yes Historical Provider, MD  celecoxib (CELEBREX) 200 MG capsule Take 1 capsule by mouth daily. AM 03/11/14  Yes Historical Provider, MD  cyclobenzaprine (FLEXERIL) 10 MG tablet Take 1 tablet (10 mg total) by mouth at bedtime. 01/23/15  Yes Glean Hess, MD  glucose blood test strip Place 1 each onto the skin 2 (two) times daily. 12/03/13  Yes Historical Provider, MD  metFORMIN (GLUCOPHAGE) 500 MG tablet Take 1 tablet by mouth 2 (two) times daily. 09/16/13  Yes Historical Provider, MD  valsartan-hydrochlorothiazide (DIOVAN-HCT) 160-25 MG per tablet Take 1 tablet by mouth daily. AM 05/04/14  Yes Historical Provider, MD  fluconazole (DIFLUCAN) 150 MG tablet Take 1 tablet (150 mg total) by mouth once. 01/23/15   Glean Hess, MD  HYDROcodone-acetaminophen (NORCO) 5-325 MG per tablet Take 1 tablet by mouth every 6 (six) hours as needed for moderate pain. Patient not taking: Reported on 01/23/2015 09/02/14   Samara Deist, DPM  nystatin cream (MYCOSTATIN) Apply 1 application topically 2 (two) times daily. 01/06/13   Historical Provider, MD  predniSONE (DELTASONE) 20 MG tablet Take 1 tablet (20 mg total) by mouth daily. 02/27/15   Norval Gable, MD   Meds Ordered and Administered this  Visit  Medications - No data to display  BP 168/97 mmHg  Pulse 88  Temp(Src) 98.4 F (36.9 C) (Oral)  Resp 20  Ht '5\' 4"'$  (1.626 m)  Wt 305 lb (138.347 kg)  BMI 52.33 kg/m2  SpO2 98% No data found.   Physical Exam  Constitutional: She appears well-developed and well-nourished. No distress.  HENT:  Head: Normocephalic and atraumatic.  Right Ear: Tympanic membrane normal.  Left Ear: Tympanic membrane normal.  Nose: Rhinorrhea present.  Mouth/Throat: Uvula is midline and mucous membranes are normal. No oropharyngeal exudate,  posterior oropharyngeal edema or posterior oropharyngeal erythema.  Cardiovascular: Normal rate, regular rhythm and normal heart sounds.   Pulmonary/Chest: Effort normal and breath sounds normal. No respiratory distress. She has no wheezes. She has no rales.  Skin: Rash noted. She is not diaphoretic.  Diffuse, non-urticarial, non-papular, non-blanchable erythema on upper anterior neck, cheeks, nose and forehead; no discreet lesions or drainage  Nursing note and vitals reviewed.   ED Course  Procedures (including critical care time)  Labs Review Labs Reviewed - No data to display  Imaging Review No results found.   Visual Acuity Review  Right Eye Distance:   Left Eye Distance:   Bilateral Distance:    Right Eye Near:   Left Eye Near:    Bilateral Near:         MDM   1. Rash   (possibly secondary to recent drug-amoxicillin)  Discharge Medication List as of 02/27/2015  6:32 PM    START taking these medications   Details  predniSONE (DELTASONE) 20 MG tablet Take 1 tablet (20 mg total) by mouth daily., Starting 02/27/2015, Until Discontinued, Normal       1. diagnosis reviewed with patient 2. rx as per orders above; reviewed possible side effects, interactions, risks and benefits  3. Recommend supportive treatment with otc anti-histamines 4. Follow-up prn if symptoms worsen or don't improve    Norval Gable, MD 02/27/15 2055

## 2015-02-27 NOTE — ED Notes (Signed)
Patient has had a rash for 6 days starting on her neck. The rash had spread to the rest of her body and is now burning. Patient is not aware of how or why the rash started.

## 2015-03-03 ENCOUNTER — Other Ambulatory Visit: Payer: Self-pay | Admitting: Internal Medicine

## 2015-03-03 NOTE — Telephone Encounter (Signed)
pts coming in on 1/11

## 2015-03-08 ENCOUNTER — Encounter: Payer: Self-pay | Admitting: Internal Medicine

## 2015-03-08 ENCOUNTER — Other Ambulatory Visit: Payer: Self-pay | Admitting: Internal Medicine

## 2015-03-08 ENCOUNTER — Ambulatory Visit (INDEPENDENT_AMBULATORY_CARE_PROVIDER_SITE_OTHER): Payer: Medicare Other | Admitting: Internal Medicine

## 2015-03-08 VITALS — BP 136/84 | HR 76 | Ht 64.0 in | Wt 306.0 lb

## 2015-03-08 DIAGNOSIS — E1129 Type 2 diabetes mellitus with other diabetic kidney complication: Secondary | ICD-10-CM | POA: Diagnosis not present

## 2015-03-08 DIAGNOSIS — R21 Rash and other nonspecific skin eruption: Secondary | ICD-10-CM

## 2015-03-08 DIAGNOSIS — I10 Essential (primary) hypertension: Secondary | ICD-10-CM

## 2015-03-08 DIAGNOSIS — Z1239 Encounter for other screening for malignant neoplasm of breast: Secondary | ICD-10-CM

## 2015-03-08 MED ORDER — TRIAMCINOLONE ACETONIDE 0.1 % EX CREA: 1.0000 "application " | TOPICAL_CREAM | Freq: Two times a day (BID) | CUTANEOUS | Status: DC

## 2015-03-08 NOTE — Progress Notes (Signed)
Date:  03/08/2015   Name:  Megan Shields   DOB:  1946/02/15   MRN:  [id number]   Chief Complaint: Follow-up; Diabetes; Hypertension; and Rash Diabetes She presents for her follow-up diabetic visit. She has type 2 diabetes mellitus. Her disease course has been stable. Pertinent negatives for hypoglycemia include no dizziness, headaches or tremors. Pertinent negatives for diabetes include no chest pain and no fatigue. Current diabetic treatment includes oral agent (monotherapy). She is following a generally healthy diet. There is no change in her home blood glucose trend. Her breakfast blood glucose is taken between 6-7 am. Her breakfast blood glucose range is generally 130-140 mg/dl. An ACE inhibitor/angiotensin II receptor blocker is being taken. Eye exam is current.  Hypertension This is a chronic problem. The current episode started more than 1 year ago. The problem is unchanged. The problem is controlled. Pertinent negatives include no chest pain, headaches, palpitations or shortness of breath. Past treatments include angiotensin blockers and diuretics.  Rash This is a new problem. The current episode started in the past 7 days. The problem has been gradually improving since onset. The affected locations include the neck and face. The rash is characterized by itchiness and redness. Pertinent negatives include no diarrhea, fatigue, fever or shortness of breath. Past treatments include antihistamine and anti-itch cream. The treatment provided moderate relief.   The rash started after she took amoxicillin for dental prophylaxis. She is taking Benadryl and Zantac. She was prescribed prednisone taper by urgent care but declined to take it because it would run her blood sugar up.    Review of Systems  Constitutional: Negative for fever, chills and fatigue.  Eyes: Negative for visual disturbance.  Respiratory: Negative for chest tightness and shortness of breath.   Cardiovascular: Negative for  chest pain, palpitations and leg swelling.  Gastrointestinal: Negative for abdominal pain, diarrhea and constipation.  Genitourinary: Negative for dysuria, frequency, hematuria and decreased urine volume.  Skin: Positive for color change (redness of the labia - residual from yeast infection) and rash.  Neurological: Negative for dizziness, tremors and headaches.  Psychiatric/Behavioral: Negative for dysphoric mood.    Patient Active Problem List   Diagnosis Date Noted  . Type II diabetes mellitus with renal manifestations ([place]) 08/01/2014  . Essential (primary) hypertension 08/01/2014  . 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 Arthritis of knee, degenerative 08/01/2014  . Low back strain 08/01/2014    Prior to Admission medications   Medication Sig Start Date End Date Taking? Authorizing Provider  acetaminophen (TYLENOL) 500 MG tablet Take 1,000 mg by mouth at bedtime as needed.   Yes Historical Provider, MD  celecoxib (CELEBREX) 200 MG capsule TAKE 1 CAPSULE DAILY 03/03/15  Yes Glean Hess, MD  cyclobenzaprine (FLEXERIL) 10 MG tablet Take 1 tablet (10 mg total) by mouth at bedtime. 01/23/15  Yes Glean Hess, MD  glucose blood test strip Place 1 each onto the skin 2 (two) times daily. Reported on 03/08/2015 12/03/13  Yes Historical Provider, MD  metFORMIN (GLUCOPHAGE) 500 MG tablet Take 1 tablet by mouth 2 (two) times daily. 09/16/13  Yes Historical Provider, MD  valsartan-hydrochlorothiazide (DIOVAN-HCT) 160-25 MG per tablet Take 1 tablet by mouth daily. AM 05/04/14  Yes Historical Provider, MD    Allergies  Allergen Reactions  . Pneumococcal Vaccines Hives  . Codeine     Other reaction(s): drowsiness  . Amoxicillin Rash    Past Surgical History  Procedure Laterality Date  . Laparoscopic gastric banding  2010  Lap Band  . Ventral hernia repair  2008  . Cholecystectomy    . Total knee arthroplasty Bilateral 2003, 2004  . Carpal tunnel release Bilateral 1985  . Dilation  and curettage of uterus  12/2011    benign endometrial polyp  . Hernia repair    . Lymph node removed Right   . Total knee arthroplasty Bilateral   . Hallux valgus austin Right 09/02/2014    Procedure: HALLUX VALGUS AUSTIN;  Surgeon: Samara Deist, DPM;  Location: [facility] ORS;  Service: Podiatry;  Laterality: Right;    Social History  Substance Use Topics  . Smoking status: Never Smoker   . Smokeless tobacco: None  . Alcohol Use: No     Medication list has been reviewed and updated.   Physical Exam  Constitutional: She is oriented to person, place, and time. She appears well-developed. No distress.  HENT:  Head: Normocephalic and atraumatic.  Eyes: Conjunctivae are normal. Right eye exhibits no discharge. Left eye exhibits no discharge. No scleral icterus.  Neck: No thyroid mass present.  Cardiovascular: Normal rate, regular rhythm and normal heart sounds.   Pulmonary/Chest: Effort normal and breath sounds normal. No respiratory distress.  Musculoskeletal: Normal range of motion. She exhibits no edema.  Neurological: She is alert and oriented to person, place, and time.  Skin: Skin is warm and dry. No rash noted. Burn: under chin and upper chest.  Psychiatric: She has a normal mood and affect. Her behavior is normal. Thought content normal.    BP 136/84 mmHg  Pulse 76  Ht '5\' 4"'$  (1.626 m)  Wt 306 lb (138.801 kg)  BMI 52.50 kg/m2  Assessment and Plan: 1. Essential (primary) hypertension Controlled on current regimen - Comprehensive metabolic panel  2. Type 2 diabetes mellitus with other diabetic kidney complication, without long-term current use of insulin (HCC) Continue oral agents and diet - efforts at weight loss with be of great benefit - Hemoglobin A1c  3. Rash and nonspecific skin eruption Use triamcinolone to facial and neck rash Desitin to labial irritation - triamcinolone cream (KENALOG) 0.1 %; Apply 1 application topically 2 (two) times daily.  Dispense: 30 g;  Refill: 0  4. Breast cancer screening - MM DIGITAL SCREENING BILATERAL; Future   Halina Maidens, MD [facility]  03/08/2015

## 2015-03-09 ENCOUNTER — Other Ambulatory Visit: Payer: Self-pay | Admitting: Internal Medicine

## 2015-03-09 ENCOUNTER — Telehealth: Payer: Self-pay

## 2015-03-09 LAB — COMPREHENSIVE METABOLIC PANEL
A/G RATIO: 1.2 (ref 1.1–2.5)
ALBUMIN: 4.1 g/dL (ref 3.6–4.8)
ALK PHOS: 76 IU/L (ref 39–117)
ALT: 28 IU/L (ref 0–32)
AST: 29 IU/L (ref 0–40)
BUN / CREAT RATIO: 17 (ref 11–26)
BUN: 12 mg/dL (ref 8–27)
Bilirubin Total: 0.4 mg/dL (ref 0.0–1.2)
CO2: 24 mmol/L (ref 18–29)
CREATININE: 0.72 mg/dL (ref 0.57–1.00)
Calcium: 9.7 mg/dL (ref 8.7–10.3)
Chloride: 95 mmol/L — ABNORMAL LOW (ref 96–106)
GFR calc Af Amer: 100 mL/min/{1.73_m2} (ref >59)
GFR, EST NON AFRICAN AMERICAN: 87 mL/min/{1.73_m2} (ref >59)
GLOBULIN, TOTAL: 3.3 g/dL (ref 1.5–4.5)
Glucose: 124 mg/dL — ABNORMAL HIGH (ref 65–99)
POTASSIUM: 4.2 mmol/L (ref 3.5–5.2)
SODIUM: 141 mmol/L (ref 134–144)
Total Protein: 7.4 g/dL (ref 6.0–8.5)

## 2015-03-09 LAB — HEMOGLOBIN A1C
Est. average glucose Bld gHb Est-mCnc: 171 mg/dL
HEMOGLOBIN A1C: 7.6 % — AB (ref 4.8–5.6)

## 2015-03-09 MED ORDER — METFORMIN HCL 500 MG PO TABS: 500.0000 mg | ORAL_TABLET | Freq: Three times a day (TID) | ORAL | Status: DC

## 2015-03-09 NOTE — Telephone Encounter (Signed)
Spoke with pt. Pt. Advised. Patient verbalized understanding and will return in may for her follow up visit.

## 2015-03-09 NOTE — Telephone Encounter (Signed)
-----   Message from Glean Hess, MD sent at 03/09/2015  7:56 AM EST ----- A1C is up from 7.2.  Other labs are normal.  Increase metformin to 3 tabs per day - 2 in the AM and 1 in the PM.  I will send new Rx.

## 2015-03-22 DIAGNOSIS — E1165 Type 2 diabetes mellitus with hyperglycemia: Secondary | ICD-10-CM | POA: Diagnosis not present

## 2015-04-17 ENCOUNTER — Other Ambulatory Visit: Payer: Self-pay | Admitting: Internal Medicine

## 2015-04-20 ENCOUNTER — Ambulatory Visit
Admission: RE | Admit: 2015-04-20 | Discharge: 2015-04-20 | Disposition: A | Discharge status: Home / Self Care | Payer: Medicare Other | Source: Ambulatory Visit | Attending: Internal Medicine | Admitting: Internal Medicine

## 2015-04-20 DIAGNOSIS — Z1231 Encounter for screening mammogram for malignant neoplasm of breast: Secondary | ICD-10-CM | POA: Insufficient documentation

## 2015-04-20 DIAGNOSIS — Z1239 Encounter for other screening for malignant neoplasm of breast: Secondary | ICD-10-CM

## 2015-05-30 ENCOUNTER — Other Ambulatory Visit: Payer: Self-pay | Admitting: Internal Medicine

## 2015-07-06 ENCOUNTER — Ambulatory Visit (INDEPENDENT_AMBULATORY_CARE_PROVIDER_SITE_OTHER): Payer: Medicare Other | Admitting: Internal Medicine

## 2015-07-06 ENCOUNTER — Encounter: Payer: Self-pay | Admitting: Internal Medicine

## 2015-07-06 VITALS — BP 122/82 | HR 68 | Resp 16 | Ht 64.0 in | Wt 301.0 lb

## 2015-07-06 DIAGNOSIS — I10 Essential (primary) hypertension: Secondary | ICD-10-CM | POA: Diagnosis not present

## 2015-07-06 DIAGNOSIS — R05 Cough: Secondary | ICD-10-CM | POA: Diagnosis not present

## 2015-07-06 DIAGNOSIS — E1121 Type 2 diabetes mellitus with diabetic nephropathy: Secondary | ICD-10-CM

## 2015-07-06 DIAGNOSIS — E118 Type 2 diabetes mellitus with unspecified complications: Secondary | ICD-10-CM | POA: Insufficient documentation

## 2015-07-06 DIAGNOSIS — E1129 Type 2 diabetes mellitus with other diabetic kidney complication: Secondary | ICD-10-CM

## 2015-07-06 DIAGNOSIS — R058 Other specified cough: Secondary | ICD-10-CM

## 2015-07-06 NOTE — Progress Notes (Signed)
Date:  07/06/2015   Name:  Megan Shields   DOB:  1945-05-17   MRN:  [id number]   Chief Complaint: Diabetes Diabetes She presents for her follow-up diabetic visit. She has type 2 diabetes mellitus. Her disease course has been stable. Pertinent negatives for hypoglycemia include no dizziness or headaches. Pertinent negatives for diabetes include no chest pain, no fatigue, no polydipsia and no polyuria. Current diabetic treatment includes oral agent (monotherapy). She is compliant with treatment all of the time. There is no change in her home blood glucose trend. Her breakfast blood glucose is taken between 6-7 am. Her breakfast blood glucose range is generally 130-140 mg/dl.  Hypertension This is a chronic problem. The current episode started more than 1 year ago. The problem is unchanged. The problem is controlled. Pertinent negatives include no chest pain, headaches, palpitations or shortness of breath.  Cough - started with the spring pollen.  She feels well but has a loose cough intermittently during the day.  No shortness of breath, fever, chills or wheezing.  She has not taken any medication.    Review of Systems  Constitutional: Negative for fever, chills and fatigue.  HENT: Negative for hearing loss.   Eyes: Negative for visual disturbance.  Respiratory: Positive for cough. Negative for chest tightness and shortness of breath.   Cardiovascular: Negative for chest pain, palpitations and leg swelling.  Gastrointestinal: Negative for abdominal pain.  Endocrine: Negative for polydipsia and polyuria.  Musculoskeletal: Positive for arthralgias (bruised right knee).  Neurological: Negative for dizziness and headaches.    Patient Active Problem List   Diagnosis Date Noted  . Type 2 diabetes mellitus with kidney complication, without long-term current use of insulin ([place]) 07/06/2015  . Essential (primary) hypertension 08/01/2014  . 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 Arthritis of  knee, degenerative 08/01/2014  . Low back strain 08/01/2014    Prior to Admission medications   Medication Sig Start Date End Date Taking? Authorizing Provider  acetaminophen (TYLENOL) 500 MG tablet Take 1,000 mg by mouth at bedtime as needed.   Yes Historical Provider, MD  celecoxib (CELEBREX) 200 MG capsule TAKE 1 CAPSULE DAILY 05/30/15  Yes Glean Hess, MD  clindamycin (CLEOCIN) 150 MG capsule TK 4 CS PO 1 HOUR PRIOR TO APPOINTMENT 05/30/15  Yes Historical Provider, MD  cyclobenzaprine (FLEXERIL) 10 MG tablet Take 1 tablet (10 mg total) by mouth at bedtime. 01/23/15  Yes Glean Hess, MD  glucose blood test strip Place 1 each onto the skin 2 (two) times daily. Reported on 03/08/2015 12/03/13  Yes Historical Provider, MD  metFORMIN (GLUCOPHAGE) 500 MG tablet Take 1 tablet (500 mg total) by mouth 3 (three) times daily. 2 in the AM and 1 in the PM 03/09/15  Yes Glean Hess, MD  valsartan-hydrochlorothiazide (DIOVAN-HCT) 160-25 MG tablet TAKE 1 TABLET DAILY 04/17/15  Yes Glean Hess, MD    Allergies  Allergen Reactions  . Pneumococcal Vaccines Hives  . Codeine     Other reaction(s): drowsiness  . Amoxicillin Rash    Past Surgical History  Procedure Laterality Date  . Laparoscopic gastric banding  2010    Lap Band  . Ventral hernia repair  2008  . Cholecystectomy    . Total knee arthroplasty Bilateral 2003, 2004  . Carpal tunnel release Bilateral 1985  . Dilation and curettage of uterus  12/2011    benign endometrial polyp  . Hernia repair    . Lymph node removed Right   .  Total knee arthroplasty Bilateral   . Hallux valgus austin Right 09/02/2014    Procedure: HALLUX VALGUS AUSTIN;  Surgeon: Samara Deist, DPM;  Location: [facility] ORS;  Service: Podiatry;  Laterality: Right;    Social History  Substance Use Topics  . Smoking status: Never Smoker   . Smokeless tobacco: None  . Alcohol Use: No     Medication list has been reviewed and updated.   Physical Exam    Constitutional: She is oriented to person, place, and time. She appears well-developed. No distress.  HENT:  Head: Normocephalic and atraumatic.  Neck: Normal range of motion. Neck supple.  Cardiovascular: Normal rate, regular rhythm and normal heart sounds.   Pulmonary/Chest: Effort normal and breath sounds normal. No respiratory distress.  Musculoskeletal: Normal range of motion.       Right knee: Tenderness (over patella) found.  Normal ROM both knees  Neurological: She is alert and oriented to person, place, and time.  Skin: Skin is warm and dry. No rash noted.  Psychiatric: She has a normal mood and affect. Her behavior is normal. Thought content normal.  Nursing note and vitals reviewed.   BP 122/82 mmHg  Pulse 68  Resp 16  Ht '5\' 4"'$  (1.626 m)  Wt 301 lb (136.533 kg)  BMI 51.64 kg/m2  SpO2 97%  Assessment and Plan: 1. Type 2 diabetes mellitus with other diabetic kidney complication, without long-term current use of insulin (HCC) Back to metformin bid due to diarrhea Consider adding another agent if needed - Hemoglobin A1c  2. Essential (primary) hypertension controlled  3. Allergic cough Allegra or claritin recommended PRN   Halina Maidens, MD [facility]  07/06/2015

## 2015-07-06 NOTE — Patient Instructions (Signed)
DASH Eating Plan  DASH stands for "Dietary Approaches to Stop Hypertension." The DASH eating plan is a healthy eating plan that has been shown to reduce high blood pressure (hypertension). Additional health benefits may include reducing the risk of type 2 diabetes mellitus, heart disease, and stroke. The DASH eating plan may also help with weight loss.  WHAT DO I NEED TO KNOW ABOUT THE DASH EATING PLAN?  For the DASH eating plan, you will follow these general guidelines:  · Choose foods with a percent daily value for sodium of less than 5% (as listed on the food label).  · Use salt-free seasonings or herbs instead of table salt or sea salt.  · Check with your health care provider or pharmacist before using salt substitutes.  · Eat lower-sodium products, often labeled as "lower sodium" or "no salt added."  · Eat fresh foods.  · Eat more vegetables, fruits, and low-fat dairy products.  · Choose whole grains. Look for the word "whole" as the first word in the ingredient list.  · Choose fish and skinless chicken or turkey more often than red meat. Limit fish, poultry, and meat to 6 oz (170 g) each day.  · Limit sweets, desserts, sugars, and sugary drinks.  · Choose heart-healthy fats.  · Limit cheese to 1 oz (28 g) per day.  · Eat more home-cooked food and less restaurant, buffet, and fast food.  · Limit fried foods.  · Cook foods using methods other than frying.  · Limit canned vegetables. If you do use them, rinse them well to decrease the sodium.  · When eating at a restaurant, ask that your food be prepared with less salt, or no salt if possible.  WHAT FOODS CAN I EAT?  Seek help from a dietitian for individual calorie needs.  Grains  Whole grain or whole wheat bread. Brown rice. Whole grain or whole wheat pasta. Quinoa, bulgur, and whole grain cereals. Low-sodium cereals. Corn or whole wheat flour tortillas. Whole grain cornbread. Whole grain crackers. Low-sodium crackers.  Vegetables  Fresh or frozen vegetables  (raw, steamed, roasted, or grilled). Low-sodium or reduced-sodium tomato and vegetable juices. Low-sodium or reduced-sodium tomato sauce and paste. Low-sodium or reduced-sodium canned vegetables.   Fruits  All fresh, canned (in natural juice), or frozen fruits.  Meat and Other Protein Products  Ground beef (85% or leaner), grass-fed beef, or beef trimmed of fat. Skinless chicken or turkey. Ground chicken or turkey. Pork trimmed of fat. All fish and seafood. Eggs. Dried beans, peas, or lentils. Unsalted nuts and seeds. Unsalted canned beans.  Dairy  Low-fat dairy products, such as skim or 1% milk, 2% or reduced-fat cheeses, low-fat ricotta or cottage cheese, or plain low-fat yogurt. Low-sodium or reduced-sodium cheeses.  Fats and Oils  Tub margarines without trans fats. Light or reduced-fat mayonnaise and salad dressings (reduced sodium). Avocado. Safflower, olive, or canola oils. Natural peanut or almond butter.  Other  Unsalted popcorn and pretzels.  The items listed above may not be a complete list of recommended foods or beverages. Contact your dietitian for more options.  WHAT FOODS ARE NOT RECOMMENDED?  Grains  White bread. White pasta. White rice. Refined cornbread. Bagels and croissants. Crackers that contain trans fat.  Vegetables  Creamed or fried vegetables. Vegetables in a cheese sauce. Regular canned vegetables. Regular canned tomato sauce and paste. Regular tomato and vegetable juices.  Fruits  Dried fruits. Canned fruit in light or heavy syrup. Fruit juice.  Meat and Other Protein   Products  Fatty cuts of meat. Ribs, chicken wings, bacon, sausage, bologna, salami, chitterlings, fatback, hot dogs, bratwurst, and packaged luncheon meats. Salted nuts and seeds. Canned beans with salt.  Dairy  Whole or 2% milk, cream, half-and-half, and cream cheese. Whole-fat or sweetened yogurt. Full-fat cheeses or blue cheese. Nondairy creamers and whipped toppings. Processed cheese, cheese spreads, or cheese  curds.  Condiments  Onion and garlic salt, seasoned salt, table salt, and sea salt. Canned and packaged gravies. Worcestershire sauce. Tartar sauce. Barbecue sauce. Teriyaki sauce. Soy sauce, including reduced sodium. Steak sauce. Fish sauce. Oyster sauce. Cocktail sauce. Horseradish. Ketchup and mustard. Meat flavorings and tenderizers. Bouillon cubes. Hot sauce. Tabasco sauce. Marinades. Taco seasonings. Relishes.  Fats and Oils  Butter, stick margarine, lard, shortening, ghee, and bacon fat. Coconut, palm kernel, or palm oils. Regular salad dressings.  Other  Pickles and olives. Salted popcorn and pretzels.  The items listed above may not be a complete list of foods and beverages to avoid. Contact your dietitian for more information.  WHERE CAN I FIND MORE INFORMATION?  National Heart, Lung, and [employer] Institute: www.nhlbi.nih.gov/health/health-topics/topics/dash/     This information is not intended to replace advice given to you by your health care provider. Make sure you discuss any questions you have with your health care provider.     Document Released: 01/31/2011 Document Revised: 03/04/2014 Document Reviewed: 12/16/2012  Elsevier Interactive Patient Education ©2016 [employer]

## 2015-07-07 LAB — HEMOGLOBIN A1C
ESTIMATED AVERAGE GLUCOSE: 166 mg/dL
Hgb A1c MFr Bld: 7.4 % — ABNORMAL HIGH (ref 4.8–5.6)

## 2015-09-10 ENCOUNTER — Other Ambulatory Visit: Payer: Self-pay | Admitting: Internal Medicine

## 2015-11-22 ENCOUNTER — Encounter: Payer: Self-pay | Admitting: Internal Medicine

## 2015-11-22 ENCOUNTER — Ambulatory Visit (INDEPENDENT_AMBULATORY_CARE_PROVIDER_SITE_OTHER): Payer: Medicare Other | Admitting: Internal Medicine

## 2015-11-22 VITALS — BP 120/78 | HR 64 | Resp 16 | Ht 64.0 in | Wt 303.0 lb

## 2015-11-22 DIAGNOSIS — E1129 Type 2 diabetes mellitus with other diabetic kidney complication: Secondary | ICD-10-CM

## 2015-11-22 DIAGNOSIS — M170 Bilateral primary osteoarthritis of knee: Secondary | ICD-10-CM | POA: Diagnosis not present

## 2015-11-22 DIAGNOSIS — I10 Essential (primary) hypertension: Secondary | ICD-10-CM

## 2015-11-22 DIAGNOSIS — S40019A Contusion of unspecified shoulder, initial encounter: Secondary | ICD-10-CM | POA: Insufficient documentation

## 2015-11-22 DIAGNOSIS — Z1159 Encounter for screening for other viral diseases: Secondary | ICD-10-CM | POA: Diagnosis not present

## 2015-11-22 DIAGNOSIS — S40011A Contusion of right shoulder, initial encounter: Secondary | ICD-10-CM

## 2015-11-22 DIAGNOSIS — Z0000 Encounter for general adult medical examination without abnormal findings: Secondary | ICD-10-CM

## 2015-11-22 HISTORY — DX: Contusion of unspecified shoulder, initial encounter: S40.019A

## 2015-11-22 NOTE — Progress Notes (Signed)
Patient: Megan Shields, Female    DOB: 03-02-1945, 70 y.o.   MRN: [id number] Visit Date: 11/22/2015  Today's Provider: Halina Maidens, MD   Chief Complaint  Patient presents with  . Medicare Wellness   Subjective:    Annual wellness visit Megan Shields is a 70 y.o. female who presents today for her Subsequent Annual Wellness Visit. She feels fairly well. She reports exercising none. She reports she is sleeping fairly well. Mammogram was done earlier this year.  Colonoscopy is due next year.  ----------------------------------------------------------- Diabetes  She presents for her follow-up diabetic visit. She has type 2 diabetes mellitus. Her disease course has been stable. There are no hypoglycemic associated symptoms. Pertinent negatives for hypoglycemia include no dizziness, headaches, nervousness/anxiousness or tremors. Pertinent negatives for diabetes include no chest pain, no fatigue, no polydipsia and no polyuria. Symptoms are stable. An ACE inhibitor/angiotensin II receptor blocker is being taken. Eye exam is current.  Hypertension  This is a chronic problem. The current episode started more than 1 year ago. The problem is unchanged. The problem is controlled. Pertinent negatives include no chest pain, headaches, palpitations or shortness of breath.  Shoulder Injury   Incident location: 2 months ago. The right shoulder is affected. The injury mechanism was a fall. The quality of the pain is described as aching and burning. The pain radiates to the chest. Pertinent negatives include no chest pain. The symptoms are aggravated by movement and palpation. She has tried NSAIDs for the symptoms. The treatment provided mild relief.   Lab Results  Component Value Date   HGBA1C 7.4 (H) 07/06/2015    Review of Systems  Constitutional: Negative for chills, fatigue and fever.  HENT: Negative for congestion, hearing loss, tinnitus, trouble swallowing and voice change.   Eyes: Negative  for visual disturbance.  Respiratory: Negative for cough, chest tightness, shortness of breath and wheezing.   Cardiovascular: Negative for chest pain, palpitations and leg swelling.  Gastrointestinal: Negative for abdominal pain, constipation, diarrhea and vomiting.  Endocrine: Negative for polydipsia and polyuria.  Genitourinary: Negative for dysuria, frequency, genital sores, vaginal bleeding and vaginal discharge.  Musculoskeletal: Positive for arthralgias (right shoulder) and joint swelling (right knee). Negative for gait problem.  Skin: Negative for color change and rash.  Neurological: Negative for dizziness, tremors, light-headedness and headaches.  Hematological: Negative for adenopathy. Does not bruise/bleed easily.  Psychiatric/Behavioral: Negative for dysphoric mood and sleep disturbance. The patient is not nervous/anxious.     Social History   Social History  . Marital status: Married    Spouse name: N/A  . Number of children: N/A  . Years of education: N/A   Occupational History  . Not on file.   Social History Main Topics  . Smoking status: Never Smoker  . Smokeless tobacco: Never Used  . Alcohol use No  . Drug use: No  . Sexual activity: Not on file   Other Topics Concern  . Not on file   Social History Narrative  . No narrative on file    Patient Active Problem List   Diagnosis Date Noted  . Type 2 diabetes mellitus with kidney complication, without long-term current use of insulin ([place]) 07/06/2015  . Essential (primary) hypertension 08/01/2014  . 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 Arthritis of knee, degenerative 08/01/2014  . Low back strain 08/01/2014    Past Surgical History:  Procedure Laterality Date  . CARPAL TUNNEL RELEASE Bilateral 1985  . CHOLECYSTECTOMY    . DILATION AND CURETTAGE  OF UTERUS  12/2011   benign endometrial polyp  . HALLUX VALGUS AUSTIN Right 09/02/2014   Procedure: [place];  Surgeon: Samara Deist, DPM;   Location: [facility] ORS;  Service: Podiatry;  Laterality: Right;  . HERNIA REPAIR    . LAPAROSCOPIC GASTRIC BANDING  2010   Lap Band  . lymph node removed Right   . TOTAL KNEE ARTHROPLASTY Bilateral 2003, 2004  . TOTAL KNEE ARTHROPLASTY Bilateral   . VENTRAL HERNIA REPAIR  2008    Her family history includes Breast cancer in her paternal aunt; Breast cancer (age of onset: 20) in her mother.    Previous Medications   ACETAMINOPHEN (TYLENOL) 500 MG TABLET    Take 1,000 mg by mouth at bedtime as needed.   BIONECT 0.2 % CREA    Apply topically 2 (two) times daily. to affected area   CELECOXIB (CELEBREX) 200 MG CAPSULE    TAKE 1 CAPSULE DAILY   CYCLOBENZAPRINE (FLEXERIL) 10 MG TABLET    Take 1 tablet (10 mg total) by mouth at bedtime.   GLUCOSE BLOOD TEST STRIP    Place 1 each onto the skin 2 (two) times daily. Reported on 03/08/2015   METFORMIN (GLUCOPHAGE) 500 MG TABLET    TAKE 2 TABLETS EVERY MORNING AND 1 TABLET EVERY EVENING   VALSARTAN-HYDROCHLOROTHIAZIDE (DIOVAN-HCT) 160-25 MG TABLET    TAKE 1 TABLET DAILY    Patient Care Team: Glean Hess, MD as PCP - General (Family Medicine)     Objective:   Vitals: BP 120/78   Pulse 64   Resp 16   Ht '5\' 4"'$  (1.626 m)   Wt (!) 303 lb (137.4 kg)   SpO2 100%   BMI 52.01 kg/m   Physical Exam  Constitutional: She is oriented to person, place, and time. She appears well-developed and well-nourished. No distress.  HENT:  Head: Normocephalic and atraumatic.  Right Ear: Tympanic membrane and ear canal normal.  Left Ear: Tympanic membrane and ear canal normal.  Nose: Right sinus exhibits no maxillary sinus tenderness. Left sinus exhibits no maxillary sinus tenderness.  Mouth/Throat: Uvula is midline and oropharynx is clear and moist.  Eyes: Conjunctivae and EOM are normal. Right eye exhibits no discharge. Left eye exhibits no discharge. No scleral icterus.  Neck: Normal range of motion. Neck supple. Carotid bruit is not present. No erythema  present. No thyromegaly present.  Cardiovascular: Normal rate, regular rhythm, normal heart sounds and normal pulses.   Pulmonary/Chest: Effort normal. No respiratory distress. She has no wheezes. Right breast exhibits no mass, no nipple discharge, no skin change and no tenderness. Left breast exhibits no mass, no nipple discharge, no skin change and no tenderness.  Abdominal: Soft. Bowel sounds are normal. There is no hepatosplenomegaly. There is no tenderness. There is no CVA tenderness.  Musculoskeletal:       Right shoulder: She exhibits decreased range of motion and tenderness. She exhibits no swelling and no effusion.       Right knee: She exhibits decreased range of motion and swelling.  Lymphadenopathy:    She has no cervical adenopathy.    She has no axillary adenopathy.  Neurological: She is alert and oriented to person, place, and time. She has normal reflexes. No cranial nerve deficit or sensory deficit.  Foot exam - normal skin, nails, pulses and sensation bilaterally   Skin: Skin is warm, dry and intact. No rash noted.  Psychiatric: She has a normal mood and affect. Her speech is normal and  behavior is normal. Thought content normal.  Nursing note and vitals reviewed.   Activities of Daily Living In your present state of health, do you have any difficulty performing the following activities: 11/22/2015 07/06/2015  Hearing? N N  Vision? N N  Difficulty concentrating or making decisions? N N  Walking or climbing stairs? N N  Dressing or bathing? N N  Doing errands, shopping? N N  Preparing Food and eating ? N -  Using the Toilet? N -  In the past six months, have you accidently leaked urine? N -  Do you have problems with loss of bowel control? N -  Managing your Medications? N -  Managing your Finances? N -  Housekeeping or managing your Housekeeping? N -  Some recent data might be hidden    Fall Risk Assessment Fall Risk  11/22/2015 03/08/2015  Falls in the past year?  Yes No  Number falls in past yr: 2 or more -  Injury with Fall? Yes -  Follow up Falls prevention discussed -      Depression Screen PHQ 2/9 Scores 11/22/2015 03/08/2015  PHQ - 2 Score 0 0    Cognitive Testing - 6-CIT   Correct? Score   What year is it? yes 0 Yes = 0    No = 4  What month is it? yes 0 Yes = 0    No = 3  Remember:     Pia [place]au, [place], [place]     What time is it? yes 0 Yes = 0    No = 3  Count backwards from 20 to 1 yes 0 Correct = 0    1 error = 2   More than 1 error = 4  Say the months of the year in reverse. yes 0 Correct = 0    1 error = 2   More than 1 error = 4  What address did I ask you to remember? yes 0 Correct = 0  1 error = 2    2 error = 4    3 error = 6    4 error = 8    All wrong = 10       TOTAL SCORE  0/28   Interpretation:  Normal  Normal (0-7) Abnormal (8-28)        Medicare Annual Wellness Visit Summary:  Reviewed patient's Family Medical History Reviewed and updated list of patient's medical providers Assessment of cognitive impairment was done Assessed patient's functional ability Established a written schedule for health screening [place] Completed and Reviewed  Exercise Activities and Dietary recommendations Goals    . Prevent Falls       Immunization History  Administered Date(s) Administered  . Pneumococcal Conjugate-13 03/11/2014  . Pneumococcal Polysaccharide-23 09/04/2012  . Zoster 03/30/2013    Health Maintenance  Topic Date Due  . Hepatitis C Screening  06-Jul-1945  . DEXA SCAN  10/29/2012  . INFLUENZA VACCINE  02/26/2016 (Originally 09/26/2015)  . TETANUS/TDAP  02/25/2018 (Originally 10/30/1966)  . HEMOGLOBIN A1C  01/06/2016  . COLONOSCOPY  02/27/2016  . OPHTHALMOLOGY EXAM  03/23/2016  . MAMMOGRAM  04/19/2016  . FOOT EXAM  11/21/2016  . PNA vac Low Risk Adult (2 of 2 - PPSV23) 09/04/2017  . ZOSTAVAX  Completed     Discussed health benefits of physical activity, and  encouraged her to engage in regular exercise appropriate for her age and condition.    ------------------------------------------------------------------------------------------------------------  Assessment & Plan:  1. Medicare annual wellness visit, subsequent Measures satisfied Pt declines flu vaccine Pneumonia vaccines, mammograms up to date  2. Essential (primary) hypertension controlled - CBC with Differential/Platelet  3. Type 2 diabetes mellitus with other diabetic kidney complication, without long-term current use of insulin (HCC) stable - Comprehensive metabolic panel - Hemoglobin A1c - Lipid panel - TSH  4. Primary osteoarthritis of both knees Continue Celebrex  5. Need for hepatitis C screening test - Hepatitis C antibody  6. Shoulder contusion, right, initial encounter Continue Celebrex, use heat or ice - Ambulatory referral to [place], MD [facility]  11/22/2015

## 2015-11-22 NOTE — Patient Instructions (Signed)
Health Maintenance  Topic Date Due  . DEXA SCAN  10/29/2012  . INFLUENZA VACCINE  02/26/2016 (Originally 09/26/2015)  . TETANUS/TDAP  02/25/2018 (Originally 10/30/1966)  . HEMOGLOBIN A1C  01/06/2016  . COLONOSCOPY  02/27/2016  . OPHTHALMOLOGY EXAM  03/23/2016  . MAMMOGRAM  04/19/2016  . FOOT EXAM  11/21/2016  . PNA vac Low Risk Adult (2 of 2 - PPSV23) 09/04/2017  . ZOSTAVAX  Completed  . Hepatitis C Screening  Addressed   DASH Eating Plan DASH stands for "Dietary Approaches to Stop Hypertension." The DASH eating plan is a healthy eating plan that has been shown to reduce high blood pressure (hypertension). Additional health benefits may include reducing the risk of type 2 diabetes mellitus, heart disease, and stroke. The DASH eating plan may also help with weight loss. WHAT DO I NEED TO KNOW ABOUT THE DASH EATING PLAN? For the DASH eating plan, you will follow these general guidelines:  Choose foods with a percent daily value for sodium of less than 5% (as listed on the food label).  Use salt-free seasonings or herbs instead of table salt or sea salt.  Check with your health care provider or pharmacist before using salt substitutes.  Eat lower-sodium products, often labeled as "lower sodium" or "no salt added."  Eat fresh foods.  Eat more vegetables, fruits, and low-fat dairy products.  Choose whole grains. Look for the word "whole" as the first word in the ingredient list.  Choose fish and skinless chicken or [place] more often than red meat. Limit fish, poultry, and meat to 6 oz (170 g) each day.  Limit sweets, desserts, sugars, and sugary drinks.  Choose heart-healthy fats.  Limit cheese to 1 oz (28 g) per day.  Eat more home-cooked food and less restaurant, buffet, and fast food.  Limit fried foods.  Cook foods using methods other than frying.  Limit canned vegetables. If you do use them, rinse them well to decrease the sodium.  When eating at a restaurant, ask that  your food be prepared with less salt, or no salt if possible. WHAT FOODS CAN I EAT? Seek help from a dietitian for individual calorie needs. Grains Whole grain or whole wheat bread. Brown rice. Whole grain or whole wheat pasta. Quinoa, bulgur, and whole grain cereals. Low-sodium cereals. Corn or whole wheat flour tortillas. Whole grain cornbread. Whole grain crackers. Low-sodium crackers. Vegetables Fresh or frozen vegetables (raw, steamed, roasted, or grilled). Low-sodium or reduced-sodium tomato and vegetable juices. Low-sodium or reduced-sodium tomato sauce and paste. Low-sodium or reduced-sodium canned vegetables.  Fruits All fresh, canned (in natural juice), or frozen fruits. Meat and Other Protein Products Ground beef (85% or leaner), grass-fed beef, or beef trimmed of fat. Skinless chicken or [place]. Ground chicken or [place]. Pork trimmed of fat. All fish and seafood. Eggs. Dried beans, peas, or lentils. Unsalted nuts and seeds. Unsalted canned beans. Dairy Low-fat dairy products, such as skim or 1% milk, 2% or reduced-fat cheeses, low-fat ricotta or cottage cheese, or plain low-fat yogurt. Low-sodium or reduced-sodium cheeses. Fats and Oils Tub margarines without trans fats. Light or reduced-fat mayonnaise and salad dressings (reduced sodium). Avocado. Safflower, olive, or canola oils. Natural peanut or almond butter. Other Unsalted popcorn and pretzels. The items listed above may not be a complete list of recommended foods or beverages. Contact your dietitian for more options. WHAT FOODS ARE NOT RECOMMENDED? Grains White bread. White pasta. White rice. Refined cornbread. Bagels and croissants. Crackers that contain trans fat. Vegetables Creamed  or fried vegetables. Vegetables in a cheese sauce. Regular canned vegetables. Regular canned tomato sauce and paste. Regular tomato and vegetable juices. Fruits Dried fruits. Canned fruit in light or heavy syrup. Fruit juice. Meat and Other  Protein Products Fatty cuts of meat. Ribs, chicken wings, bacon, sausage, bologna, salami, chitterlings, fatback, hot dogs, bratwurst, and packaged luncheon meats. Salted nuts and seeds. Canned beans with salt. Dairy Whole or 2% milk, cream, half-and-half, and cream cheese. Whole-fat or sweetened yogurt. Full-fat cheeses or blue cheese. Nondairy creamers and whipped toppings. Processed cheese, cheese spreads, or cheese curds. Condiments Onion and garlic salt, seasoned salt, table salt, and sea salt. Canned and packaged gravies. Worcestershire sauce. Tartar sauce. Barbecue sauce. Teriyaki sauce. Soy sauce, including reduced sodium. Steak sauce. Fish sauce. Oyster sauce. Cocktail sauce. Horseradish. Ketchup and mustard. Meat flavorings and tenderizers. Bouillon cubes. Hot sauce. Tabasco sauce. Marinades. Taco seasonings. Relishes. Fats and Oils Butter, stick margarine, lard, shortening, ghee, and bacon fat. Coconut, palm kernel, or palm oils. Regular salad dressings. Other Pickles and olives. Salted popcorn and pretzels. The items listed above may not be a complete list of foods and beverages to avoid. Contact your dietitian for more information. WHERE CAN I FIND MORE INFORMATION? National Heart, Lung, and Blood Institute: travelstabloid.com   This information is not intended to replace advice given to you by your health care provider. Make sure you discuss any questions you have with your health care provider.   Document Released: 01/31/2011 Document Revised: 03/04/2014 Document Reviewed: 12/16/2012 Elsevier Interactive Patient Education [employer].

## 2015-11-23 LAB — COMPREHENSIVE METABOLIC PANEL
ALBUMIN: 4.2 g/dL (ref 3.6–4.8)
ALK PHOS: 72 IU/L (ref 39–117)
ALT: 30 IU/L (ref 0–32)
AST: 34 IU/L (ref 0–40)
Albumin/Globulin Ratio: 1.3 (ref 1.2–2.2)
BILIRUBIN TOTAL: 0.5 mg/dL (ref 0.0–1.2)
BUN / CREAT RATIO: 20 (ref 12–28)
BUN: 16 mg/dL (ref 8–27)
CHLORIDE: 96 mmol/L (ref 96–106)
CO2: 26 mmol/L (ref 18–29)
Calcium: 9.7 mg/dL (ref 8.7–10.3)
Creatinine, Ser: 0.79 mg/dL (ref 0.57–1.00)
GFR calc Af Amer: 89 mL/min/{1.73_m2} (ref >59)
GFR calc non Af Amer: 77 mL/min/{1.73_m2} (ref >59)
GLUCOSE: 100 mg/dL — AB (ref 65–99)
Globulin, Total: 3.2 g/dL (ref 1.5–4.5)
Potassium: 4.4 mmol/L (ref 3.5–5.2)
SODIUM: 141 mmol/L (ref 134–144)
Total Protein: 7.4 g/dL (ref 6.0–8.5)

## 2015-11-23 LAB — CBC WITH DIFFERENTIAL/PLATELET
BASOS ABS: 0 10*3/uL (ref 0.0–0.2)
Basos: 1 %
EOS (ABSOLUTE): 0.2 10*3/uL (ref 0.0–0.4)
Eos: 5 %
HEMOGLOBIN: 15.1 g/dL (ref 11.1–15.9)
Hematocrit: 46.4 % (ref 34.0–46.6)
Immature Grans (Abs): 0 10*3/uL (ref 0.0–0.1)
Immature Granulocytes: 0 %
LYMPHS ABS: 1.2 10*3/uL (ref 0.7–3.1)
Lymphs: 29 %
MCH: 27.5 pg (ref 26.6–33.0)
MCHC: 32.5 g/dL (ref 31.5–35.7)
MCV: 85 fL (ref 79–97)
MONOCYTES: 10 %
Monocytes Absolute: 0.4 10*3/uL (ref 0.1–0.9)
NEUTROS ABS: 2.4 10*3/uL (ref 1.4–7.0)
Neutrophils: 55 %
Platelets: 269 10*3/uL (ref 150–379)
RBC: 5.49 x10E6/uL — ABNORMAL HIGH (ref 3.77–5.28)
RDW: 14.7 % (ref 12.3–15.4)
WBC: 4.2 10*3/uL (ref 3.4–10.8)

## 2015-11-23 LAB — LIPID PANEL
CHOL/HDL RATIO: 2.5 ratio (ref 0.0–4.4)
CHOLESTEROL TOTAL: 186 mg/dL (ref 100–199)
HDL: 75 mg/dL (ref >39)
LDL CALC: 89 mg/dL (ref 0–99)
Triglycerides: 111 mg/dL (ref 0–149)
VLDL Cholesterol Cal: 22 mg/dL (ref 5–40)

## 2015-11-23 LAB — HEPATITIS C ANTIBODY

## 2015-11-23 LAB — TSH: TSH: 2.09 u[IU]/mL (ref 0.450–4.500)

## 2015-11-23 LAB — HEMOGLOBIN A1C
Est. average glucose Bld gHb Est-mCnc: 166 mg/dL
Hgb A1c MFr Bld: 7.4 % — ABNORMAL HIGH (ref 4.8–5.6)

## 2015-11-27 DIAGNOSIS — M7541 Impingement syndrome of right shoulder: Secondary | ICD-10-CM | POA: Diagnosis not present

## 2015-12-09 ENCOUNTER — Other Ambulatory Visit: Payer: Self-pay | Admitting: Internal Medicine

## 2016-01-01 DIAGNOSIS — M79671 Pain in right foot: Secondary | ICD-10-CM | POA: Diagnosis not present

## 2016-01-01 DIAGNOSIS — G5791 Unspecified mononeuropathy of right lower limb: Secondary | ICD-10-CM | POA: Diagnosis not present

## 2016-01-12 ENCOUNTER — Other Ambulatory Visit: Payer: Self-pay | Admitting: Internal Medicine

## 2016-04-23 ENCOUNTER — Ambulatory Visit (INDEPENDENT_AMBULATORY_CARE_PROVIDER_SITE_OTHER): Payer: Medicare Other | Admitting: Internal Medicine

## 2016-04-23 ENCOUNTER — Encounter: Payer: Self-pay | Admitting: Internal Medicine

## 2016-04-23 VITALS — BP 118/78 | HR 64 | Ht 64.0 in | Wt 309.0 lb

## 2016-04-23 DIAGNOSIS — Z1231 Encounter for screening mammogram for malignant neoplasm of breast: Secondary | ICD-10-CM

## 2016-04-23 DIAGNOSIS — R5383 Other fatigue: Secondary | ICD-10-CM | POA: Diagnosis not present

## 2016-04-23 DIAGNOSIS — I10 Essential (primary) hypertension: Secondary | ICD-10-CM

## 2016-04-23 DIAGNOSIS — R400 Somnolence: Secondary | ICD-10-CM | POA: Diagnosis not present

## 2016-04-23 DIAGNOSIS — E1121 Type 2 diabetes mellitus with diabetic nephropathy: Secondary | ICD-10-CM | POA: Diagnosis not present

## 2016-04-23 NOTE — Progress Notes (Signed)
Date:  04/23/2016   Name:  Megan Shields   DOB:  10-14-1945   MRN:  [id number]   Chief Complaint: Diabetes (BS Range- 130-140. Last week was running 192 after breakfast.) Diabetes  She presents for her follow-up diabetic visit. She has type 2 diabetes mellitus. Her disease course has been stable. Pertinent negatives for hypoglycemia include no dizziness, headaches or tremors. Associated symptoms include fatigue. Pertinent negatives for diabetes include no chest pain, no polydipsia and no polyuria. She monitors blood glucose at home 1-2 x per day. There is no change in her home blood glucose trend. Her breakfast blood glucose is taken between 6-7 am. Her breakfast blood glucose range is generally 130-140 mg/dl.  Hypertension  This is a chronic problem. The current episode started more than 1 year ago. The problem is controlled. Pertinent negatives include no chest pain, headaches, palpitations or shortness of breath.  Fatigue - for the past 2 months having decreased energy.  Not depressed or anxious, just harder to get motivated for activities.  No increasing SOB, no chest pain, dizziness, headache.  She feels that she sleeps well 7-8 hours per night.  Minimal snoring.  She does fall asleep easily during the day now - in her chair or even sitting at the computer.  Lab Results  Component Value Date   HGBA1C 7.4 (H) 11/22/2015     Review of Systems  Constitutional: Positive for fatigue. Negative for appetite change, fever and unexpected weight change.  HENT: Negative for hearing loss, tinnitus and trouble swallowing.   Eyes: Negative for visual disturbance.  Respiratory: Negative for cough, chest tightness, shortness of breath and wheezing.   Cardiovascular: Negative for chest pain, palpitations and leg swelling.  Gastrointestinal: Negative for abdominal pain.  Endocrine: Negative for polydipsia and polyuria.  Genitourinary: Negative for dysuria and hematuria.  Musculoskeletal: Negative  for arthralgias.  Neurological: Negative for dizziness, tremors, numbness and headaches.  Hematological: Negative for adenopathy.  Psychiatric/Behavioral: Negative for dysphoric mood and sleep disturbance.    Patient Active Problem List   Diagnosis Date Noted  . Shoulder contusion 11/22/2015  . Type 2 diabetes mellitus with diabetic nephropathy, without long-term current use of insulin ([place]) 07/06/2015  . Essential (primary) hypertension 08/01/2014  . 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 Arthritis of knee, degenerative 08/01/2014  . Low back strain 08/01/2014    Prior to Admission medications   Medication Sig Start Date End Date Taking? Authorizing Provider  acetaminophen (TYLENOL) 500 MG tablet Take 1,000 mg by mouth at bedtime as needed.   Yes Historical Provider, MD  BIONECT 0.2 % CREA Apply topically 2 (two) times daily. to affected area 11/15/15  Yes Historical Provider, MD  celecoxib (CELEBREX) 200 MG capsule TAKE 1 CAPSULE DAILY 05/30/15  Yes Glean Hess, MD  cyclobenzaprine (FLEXERIL) 10 MG tablet Take 1 tablet (10 mg total) by mouth at bedtime. 01/23/15  Yes Glean Hess, MD  glucose blood test strip Place 1 each onto the skin 2 (two) times daily. Reported on 03/08/2015 12/03/13  Yes Historical Provider, MD  metFORMIN (GLUCOPHAGE) 500 MG tablet TAKE 2 TABLETS EVERY MORNING AND 1 TABLET EVERY EVENING 12/09/15  Yes Glean Hess, MD  valsartan-hydrochlorothiazide (DIOVAN-HCT) 160-25 MG tablet TAKE 1 TABLET DAILY 01/12/16  Yes Glean Hess, MD    Allergies  Allergen Reactions  . Pneumococcal Vaccines Hives  . Codeine     Other reaction(s): drowsiness  . Amoxicillin Rash    Past Surgical History:  Procedure Laterality Date  . CARPAL TUNNEL RELEASE Bilateral 1985  . CHOLECYSTECTOMY    . DILATION AND CURETTAGE OF UTERUS  12/2011   benign endometrial polyp  . HALLUX VALGUS AUSTIN Right 09/02/2014   Procedure: [place];  Surgeon: Samara Deist, DPM;   Location: [facility] ORS;  Service: Podiatry;  Laterality: Right;  . HERNIA REPAIR    . LAPAROSCOPIC GASTRIC BANDING  2010   Lap Band  . lymph node removed Right   . TOTAL KNEE ARTHROPLASTY Bilateral 2003, 2004  . TOTAL KNEE ARTHROPLASTY Bilateral   . VENTRAL HERNIA REPAIR  2008    Social History  Substance Use Topics  . Smoking status: Never Smoker  . Smokeless tobacco: Never Used  . Alcohol use No     Medication list has been reviewed and updated.   Physical Exam  Constitutional: She is oriented to person, place, and time. She appears well-developed. No distress.  HENT:  Head: Normocephalic and atraumatic.  Neck: Normal range of motion. No thyromegaly present.  Cardiovascular: Normal rate, regular rhythm and normal heart sounds.   Pulmonary/Chest: Effort normal and breath sounds normal. No respiratory distress. She has no wheezes.  Abdominal: Soft.  Musculoskeletal: She exhibits no edema or tenderness.  Neurological: She is alert and oriented to person, place, and time.  Skin: Skin is warm and dry. No rash noted.  Psychiatric: She has a normal mood and affect. Her behavior is normal. Thought content normal.  Nursing note and vitals reviewed.   BP 118/78   Pulse 64   Ht '5\' 4"'$  (1.626 m)   Wt (!) 309 lb (140.2 kg)   SpO2 98%   BMI 53.04 kg/m   Assessment and Plan: 1. Type 2 diabetes mellitus with diabetic nephropathy, without long-term current use of insulin (HCC) Continue metformin - Hemoglobin A1c  2. Essential (primary) hypertension controlled - CBC with Differential/Platelet - Comprehensive metabolic panel - TSH  3. Encounter for screening mammogram for breast cancer - MM DIGITAL SCREENING BILATERAL; Future  4. Daytime somnolence Suspect sleep apnea - pt hesitant to test due to extreme claustrophobia regarding anything on or near her face  5. Fatigue, unspecified type Rule out anemia, hypothyroid etc with labs    Halina Maidens, MD [facility]  04/23/2016

## 2016-04-24 LAB — CBC WITH DIFFERENTIAL/PLATELET
BASOS ABS: 0 10*3/uL (ref 0.0–0.2)
Basos: 1 %
EOS (ABSOLUTE): 0.1 10*3/uL (ref 0.0–0.4)
Eos: 3 %
Hematocrit: 46.6 % (ref 34.0–46.6)
Hemoglobin: 15.4 g/dL (ref 11.1–15.9)
IMMATURE GRANS (ABS): 0 10*3/uL (ref 0.0–0.1)
IMMATURE GRANULOCYTES: 1 %
LYMPHS: 30 %
Lymphocytes Absolute: 1.3 10*3/uL (ref 0.7–3.1)
MCH: 28.2 pg (ref 26.6–33.0)
MCHC: 33 g/dL (ref 31.5–35.7)
MCV: 85 fL (ref 79–97)
Monocytes Absolute: 0.4 10*3/uL (ref 0.1–0.9)
Monocytes: 8 %
NEUTROS PCT: 57 %
Neutrophils Absolute: 2.6 10*3/uL (ref 1.4–7.0)
PLATELETS: 262 10*3/uL (ref 150–379)
RBC: 5.47 x10E6/uL — ABNORMAL HIGH (ref 3.77–5.28)
RDW: 14 % (ref 12.3–15.4)
WBC: 4.4 10*3/uL (ref 3.4–10.8)

## 2016-04-24 LAB — COMPREHENSIVE METABOLIC PANEL
ALT: 30 IU/L (ref 0–32)
AST: 38 IU/L (ref 0–40)
Albumin/Globulin Ratio: 1.2 (ref 1.2–2.2)
Albumin: 4.2 g/dL (ref 3.6–4.8)
Alkaline Phosphatase: 69 IU/L (ref 39–117)
BILIRUBIN TOTAL: 0.4 mg/dL (ref 0.0–1.2)
BUN/Creatinine Ratio: 17 (ref 12–28)
BUN: 13 mg/dL (ref 8–27)
CALCIUM: 10 mg/dL (ref 8.7–10.3)
CHLORIDE: 94 mmol/L — AB (ref 96–106)
CO2: 28 mmol/L (ref 18–29)
Creatinine, Ser: 0.77 mg/dL (ref 0.57–1.00)
GFR, EST AFRICAN AMERICAN: 92 mL/min/{1.73_m2} (ref >59)
GFR, EST NON AFRICAN AMERICAN: 80 mL/min/{1.73_m2} (ref >59)
GLUCOSE: 114 mg/dL — AB (ref 65–99)
Globulin, Total: 3.4 g/dL (ref 1.5–4.5)
Potassium: 4.5 mmol/L (ref 3.5–5.2)
Sodium: 139 mmol/L (ref 134–144)
Total Protein: 7.6 g/dL (ref 6.0–8.5)

## 2016-04-24 LAB — TSH: TSH: 2.15 u[IU]/mL (ref 0.450–4.500)

## 2016-04-24 LAB — HEMOGLOBIN A1C
ESTIMATED AVERAGE GLUCOSE: 160 mg/dL
Hgb A1c MFr Bld: 7.2 % — ABNORMAL HIGH (ref 4.8–5.6)

## 2016-05-06 ENCOUNTER — Ambulatory Visit
Admission: RE | Admit: 2016-05-06 | Discharge: 2016-05-06 | Disposition: A | Discharge status: Home / Self Care | Payer: Medicare Other | Source: Ambulatory Visit | Attending: Internal Medicine | Admitting: Internal Medicine

## 2016-05-06 DIAGNOSIS — Z1231 Encounter for screening mammogram for malignant neoplasm of breast: Secondary | ICD-10-CM | POA: Diagnosis not present

## 2016-05-09 DIAGNOSIS — E1165 Type 2 diabetes mellitus with hyperglycemia: Secondary | ICD-10-CM | POA: Diagnosis not present

## 2016-05-09 LAB — HM DIABETES EYE EXAM

## 2016-07-10 ENCOUNTER — Other Ambulatory Visit: Payer: Self-pay | Admitting: Internal Medicine

## 2016-08-21 ENCOUNTER — Ambulatory Visit: Payer: Medicare Other | Admitting: Internal Medicine

## 2016-09-17 ENCOUNTER — Ambulatory Visit (INDEPENDENT_AMBULATORY_CARE_PROVIDER_SITE_OTHER): Payer: Medicare Other | Admitting: Internal Medicine

## 2016-09-17 ENCOUNTER — Encounter: Payer: Self-pay | Admitting: Internal Medicine

## 2016-09-17 VITALS — BP 126/86 | HR 65 | Ht 64.0 in | Wt 311.0 lb

## 2016-09-17 DIAGNOSIS — I10 Essential (primary) hypertension: Secondary | ICD-10-CM | POA: Diagnosis not present

## 2016-09-17 DIAGNOSIS — E1121 Type 2 diabetes mellitus with diabetic nephropathy: Secondary | ICD-10-CM | POA: Diagnosis not present

## 2016-09-17 NOTE — Progress Notes (Signed)
Date:  09/17/2016   Name:  Megan Shields   DOB:  [birth year]   MRN:  [id number]   Chief Complaint: Diabetes (BS- 33 yesterday. ) Diabetes  She presents for her follow-up diabetic visit. She has type 2 diabetes mellitus. Pertinent negatives for hypoglycemia include no headaches or tremors. Pertinent negatives for diabetes include no chest pain, no fatigue, no polydipsia and no polyuria. There are no hypoglycemic complications. Current diabetic treatment includes oral agent (monotherapy). She is compliant with treatment all of the time. Her weight is stable. She participates in exercise intermittently. She monitors blood glucose at home 1-2 x per day. Her breakfast blood glucose is taken after 10 am. Her breakfast blood glucose range is generally 140-180 mg/dl. An ACE inhibitor/angiotensin II receptor blocker is being taken. Eye exam is current.  Hypertension  This is a chronic problem. The problem is controlled. Pertinent negatives include no chest pain, headaches, palpitations or shortness of breath. Past treatments include angiotensin blockers and diuretics.    Lab Results  Component Value Date   HGBA1C 7.2 (H) 04/23/2016     Review of Systems  Constitutional: Negative for appetite change, chills, fatigue, fever and unexpected weight change.  HENT: Negative for tinnitus and trouble swallowing.   Eyes: Positive for photophobia (dry eye on left). Negative for visual disturbance.  Respiratory: Negative for cough, chest tightness, shortness of breath and wheezing.   Cardiovascular: Negative for chest pain, palpitations and leg swelling.  Gastrointestinal: Negative for abdominal pain.  Endocrine: Negative for polydipsia and polyuria.  Genitourinary: Negative for dysuria and hematuria.  Musculoskeletal: Negative for arthralgias.  Skin: Negative for color change and rash.  Allergic/Immunologic: Negative for environmental allergies.  Neurological: Negative for tremors, numbness and  headaches.  Psychiatric/Behavioral: Negative for dysphoric mood.    Patient Active Problem List   Diagnosis Date Noted  . Shoulder contusion 11/22/2015  . Type 2 diabetes mellitus with diabetic nephropathy, without long-term current use of insulin ([place]) 07/06/2015  . Essential (primary) hypertension 08/01/2014  . 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 Arthritis of knee, degenerative 08/01/2014  . Low back strain 08/01/2014    Prior to Admission medications   Medication Sig Start Date End Date Taking? Authorizing Provider  acetaminophen (TYLENOL) 500 MG tablet Take 1,000 mg by mouth at bedtime as needed.   Yes [provider]  BIONECT 0.2 % CREA Apply topically 2 (two) times daily. to affected area 11/15/15  Yes [provider]  celecoxib (CELEBREX) 200 MG capsule TAKE 1 CAPSULE DAILY 07/10/16  Yes Glean Hess, MD  cyclobenzaprine (FLEXERIL) 10 MG tablet Take 1 tablet (10 mg total) by mouth at bedtime. 01/23/15  Yes Glean Hess, MD  glucose blood test strip Place 1 each onto the skin 2 (two) times daily. Reported on 03/08/2015 12/03/13  Yes [provider]  metFORMIN (GLUCOPHAGE) 500 MG tablet TAKE 2 TABLETS EVERY MORNING AND 1 TABLET EVERY EVENING Patient taking differently: 1 tablet twice a day 12/09/15  Yes Glean Hess, MD  valsartan-hydrochlorothiazide (DIOVAN-HCT) 160-25 MG tablet TAKE 1 TABLET DAILY 07/10/16  Yes Glean Hess, MD    Allergies  Allergen Reactions  . Pneumococcal Vaccines Hives  . Codeine     Other reaction(s): drowsiness  . Amoxicillin Rash    Past Surgical History:  Procedure Laterality Date  . CARPAL TUNNEL RELEASE Bilateral 1985  . CHOLECYSTECTOMY    . DILATION AND CURETTAGE OF UTERUS  12/2011   benign endometrial polyp  .  HALLUX VALGUS AUSTIN Right 09/02/2014   Procedure: [place];  Surgeon: Samara Deist, DPM;  Location: [facility] ORS;  Service: Podiatry;  Laterality: Right;  . HERNIA REPAIR    .  LAPAROSCOPIC GASTRIC BANDING  2010   Lap Band  . lymph node removed Right   . TOTAL KNEE ARTHROPLASTY Bilateral 2003, 2004  . TOTAL KNEE ARTHROPLASTY Bilateral   . VENTRAL HERNIA REPAIR  2008    Social History  Substance Use Topics  . Smoking status: Never Smoker  . Smokeless tobacco: Never Used  . Alcohol use No     Medication list has been reviewed and updated.   Physical Exam  Constitutional: She is oriented to person, place, and time. She appears well-developed. No distress.  HENT:  Head: Normocephalic and atraumatic.  Neck: Normal range of motion. Neck supple. Carotid bruit is not present.  Cardiovascular: Normal rate and normal heart sounds.   Pulmonary/Chest: Effort normal and breath sounds normal. No respiratory distress. She has no wheezes.  Musculoskeletal: Normal range of motion.  Neurological: She is alert and oriented to person, place, and time.  Skin: Skin is warm and dry. No rash noted.  Psychiatric: She has a normal mood and affect. Her behavior is normal. Thought content normal.  Nursing note and vitals reviewed.   BP 126/86   Pulse 65   Ht 5\' 4"  (1.626 m)   Wt (!) 311 lb (141.1 kg)   SpO2 95%   BMI 53.38 kg/m   Assessment and Plan: 1. Essential (primary) hypertension On combo valsartan hct - should not be subject to the recall but patient informed  2. Type 2 diabetes mellitus with diabetic nephropathy, without long-term current use of insulin (HCC) Stable - continue metformin bid - Hemoglobin A1c   No orders of the defined types were placed in this encounter.   Halina Maidens, MD [facility]  09/17/2016

## 2016-09-18 LAB — HEMOGLOBIN A1C
ESTIMATED AVERAGE GLUCOSE: 174 mg/dL
HEMOGLOBIN A1C: 7.7 % — AB (ref 4.8–5.6)

## 2016-10-01 ENCOUNTER — Encounter: Payer: Self-pay | Admitting: Internal Medicine

## 2016-10-01 ENCOUNTER — Other Ambulatory Visit: Payer: Self-pay | Admitting: Internal Medicine

## 2016-10-01 MED ORDER — OLMESARTAN MEDOXOMIL-HCTZ 40-25 MG PO TABS: 1.0000 | ORAL_TABLET | Freq: Every day | ORAL | 1 refills | Status: DC

## 2016-10-01 NOTE — Telephone Encounter (Signed)
Patient requesting alternative med due to recall on Valsartan.

## 2016-11-22 ENCOUNTER — Encounter: Payer: Self-pay | Admitting: Internal Medicine

## 2016-11-22 ENCOUNTER — Ambulatory Visit (INDEPENDENT_AMBULATORY_CARE_PROVIDER_SITE_OTHER): Payer: Medicare Other | Admitting: Internal Medicine

## 2016-11-22 VITALS — BP 122/76 | HR 72 | Ht 64.0 in | Wt 307.0 lb

## 2016-11-22 DIAGNOSIS — Z1231 Encounter for screening mammogram for malignant neoplasm of breast: Secondary | ICD-10-CM

## 2016-11-22 DIAGNOSIS — E1121 Type 2 diabetes mellitus with diabetic nephropathy: Secondary | ICD-10-CM

## 2016-11-22 DIAGNOSIS — Z9884 Bariatric surgery status: Secondary | ICD-10-CM | POA: Diagnosis not present

## 2016-11-22 DIAGNOSIS — I10 Essential (primary) hypertension: Secondary | ICD-10-CM | POA: Diagnosis not present

## 2016-11-22 DIAGNOSIS — Z0000 Encounter for general adult medical examination without abnormal findings: Secondary | ICD-10-CM

## 2016-11-22 DIAGNOSIS — Z1211 Encounter for screening for malignant neoplasm of colon: Secondary | ICD-10-CM

## 2016-11-22 DIAGNOSIS — B353 Tinea pedis: Secondary | ICD-10-CM | POA: Diagnosis not present

## 2016-11-22 NOTE — Progress Notes (Signed)
Patient: Megan Shields, Female    DOB: [birth year], 71 y.o.   MRN: [id number] Visit Date: 11/22/2016  Today's Provider: Halina Maidens, MD   Chief Complaint  Patient presents with  . Medicare Wellness    Breast Exam.    Subjective:    Annual wellness visit Megan Shields is a 71 y.o. female who presents today for her Subsequent Annual Wellness Visit. She feels fairly well. She reports exercising walking. She reports she is sleeping fairly well.   ----------------------------------------------------------- Hypertension  This is a chronic problem. The problem is controlled. Pertinent negatives include no chest pain, headaches, palpitations or shortness of breath.  Diabetes  Pertinent negatives for hypoglycemia include no dizziness, headaches, nervousness/anxiousness or tremors. Pertinent negatives for diabetes include no chest pain, no fatigue, no polydipsia and no polyuria.  Rash  This is a chronic problem. The problem is unchanged. The affected locations include the left foot. The rash is characterized by peeling, scaling, dryness and itchiness. She was exposed to nothing. Pertinent negatives include no congestion, cough, diarrhea, fatigue, fever, shortness of breath or vomiting. Treatments tried: otc antifungal cream. The treatment provided no relief.  She has a lap band in place but has no interest in having it adjusted. Primarily it seems to be the issue with starting over with the evaluation and having to travel to [place]. Lab Results  Component Value Date   HGBA1C 7.7 (H) 09/17/2016    Review of Systems  Constitutional: Negative for chills, fatigue and fever.  HENT: Negative for congestion, hearing loss, tinnitus, trouble swallowing and voice change.   Eyes: Positive for visual disturbance.  Respiratory: Negative for cough, chest tightness, shortness of breath and wheezing.   Cardiovascular: Negative for chest pain, palpitations and leg swelling.  Gastrointestinal: Negative  for abdominal pain, constipation, diarrhea and vomiting.  Endocrine: Negative for polydipsia and polyuria.  Genitourinary: Negative for dysuria, frequency, genital sores, vaginal bleeding and vaginal discharge.  Musculoskeletal: Negative for arthralgias, gait problem and joint swelling.  Skin: Negative for color change and rash.  Neurological: Negative for dizziness, tremors, light-headedness and headaches.  Hematological: Negative for adenopathy. Does not bruise/bleed easily.  Psychiatric/Behavioral: Negative for dysphoric mood and sleep disturbance. The patient is not nervous/anxious.     Social History   Social History  . Marital status: Married    Spouse name: N/A  . Number of children: N/A  . Years of education: N/A   Occupational History  . Not on file.   Social History Main Topics  . Smoking status: Never Smoker  . Smokeless tobacco: Never Used  . Alcohol use No  . Drug use: No  . Sexual activity: Not on file   Other Topics Concern  . Not on file   Social History Narrative  . No narrative on file    Patient Active Problem List   Diagnosis Date Noted  . Shoulder contusion 11/22/2015  . Type 2 diabetes mellitus with diabetic nephropathy, without long-term current use of insulin ([place]) 07/06/2015  . Essential (primary) hypertension 08/01/2014  . 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 Arthritis of knee, degenerative 08/01/2014  . Low back strain 08/01/2014    Past Surgical History:  Procedure Laterality Date  . CARPAL TUNNEL RELEASE Bilateral 1985  . CHOLECYSTECTOMY    . DILATION AND CURETTAGE OF UTERUS  12/2011   benign endometrial polyp  . HALLUX VALGUS AUSTIN Right 09/02/2014   Procedure: [place];  Surgeon: Samara Deist, DPM;  Location: [facility] ORS;  Service:  Podiatry;  Laterality: Right;  . HERNIA REPAIR    . LAPAROSCOPIC GASTRIC BANDING  2010   Lap Band  . lymph node removed Right   . TOTAL KNEE ARTHROPLASTY Bilateral 2003, 2004  . TOTAL KNEE  ARTHROPLASTY Bilateral   . VENTRAL HERNIA REPAIR  2008    Her family history includes Breast cancer in her paternal aunt; Breast cancer (age of onset: 40) in her mother.     Previous Medications   ACETAMINOPHEN (TYLENOL) 500 MG TABLET    Take 1,000 mg by mouth at bedtime as needed.   BIONECT 0.2 % CREA    Apply topically 2 (two) times daily. to affected area   CELECOXIB (CELEBREX) 200 MG CAPSULE    TAKE 1 CAPSULE DAILY   CYCLOBENZAPRINE (FLEXERIL) 10 MG TABLET    Take 10 mg by mouth 3 (three) times daily as needed for muscle spasms.   GLUCOSE BLOOD TEST STRIP    Place 1 each onto the skin 2 (two) times daily. Reported on 03/08/2015   METFORMIN (GLUCOPHAGE) 500 MG TABLET    TAKE 2 TABLETS EVERY MORNING AND 1 TABLET EVERY EVENING   OLMESARTAN-HYDROCHLOROTHIAZIDE (BENICAR HCT) 40-25 MG TABLET    Take 1 tablet by mouth daily.    Patient Care Team: Glean Hess, MD as PCP - General (Family Medicine)      Objective:   Vitals: BP 122/76 (BP Location: Right Arm, Patient Position: Sitting, Cuff Size: Large)   Pulse 72   Ht 5\' 4"  (1.626 m)   Wt (!) 307 lb (139.3 kg)   SpO2 96%   BMI 52.70 kg/m   Physical Exam  Constitutional: She is oriented to person, place, and time. She appears well-developed and well-nourished. No distress.  HENT:  Head: Normocephalic and atraumatic.  Right Ear: Tympanic membrane and ear canal normal.  Left Ear: Tympanic membrane and ear canal normal.  Nose: Right sinus exhibits no maxillary sinus tenderness. Left sinus exhibits no maxillary sinus tenderness.  Mouth/Throat: Uvula is midline and oropharynx is clear and moist.  Eyes: Conjunctivae and EOM are normal. Right eye exhibits no discharge. Left eye exhibits no discharge. No scleral icterus.  Neck: Normal range of motion. Carotid bruit is not present. No erythema present. No thyromegaly present.  Cardiovascular: Normal rate, regular rhythm, normal heart sounds and normal pulses.   Pulmonary/Chest: Effort  normal. No respiratory distress. She has no wheezes. Right breast exhibits no mass, no nipple discharge, no skin change and no tenderness. Left breast exhibits no mass, no nipple discharge, no skin change and no tenderness.  Abdominal: Soft. Bowel sounds are normal. There is no hepatosplenomegaly. There is no tenderness. There is no CVA tenderness.  Musculoskeletal: She exhibits no edema or tenderness.  Lymphadenopathy:    She has no cervical adenopathy.    She has no axillary adenopathy.  Neurological: She is alert and oriented to person, place, and time. She has normal reflexes. No cranial nerve deficit or sensory deficit.  Skin: Skin is warm, dry and intact. No rash noted.  Psychiatric: She has a normal mood and affect. Her speech is normal and behavior is normal. Thought content normal.  Nursing note and vitals reviewed.   Activities of Daily Living In your present state of health, do you have any difficulty performing the following activities: 11/22/2016  Hearing? N  Vision? N  Difficulty concentrating or making decisions? N  Walking or climbing stairs? N  Dressing or bathing? N  Doing errands, shopping? N  Preparing  Food and eating ? N  Using the Toilet? N  In the past six months, have you accidently leaked urine? Y  Do you have problems with loss of bowel control? N  Managing your Medications? N  Managing your Finances? N  Housekeeping or managing your Housekeeping? N  Some recent data might be hidden    Fall Risk Assessment Fall Risk  11/22/2016 11/22/2015 03/08/2015  Falls in the past year? No Yes No  Number falls in past yr: - 2 or more -  Injury with Fall? - Yes -  Follow up - Falls prevention discussed -     Depression Screen PHQ 2/9 Scores 11/22/2016 11/22/2015 03/08/2015  PHQ - 2 Score 0 0 0    6CIT Screen 11/22/2016 11/22/2015  What Year? 0 points 0 points  What month? 0 points 0 points  What time? 0 points 0 points  Count back from 20 0 points 0 points  Months  in reverse 2 points 0 points  Repeat phrase 2 points 0 points  Total Score 4 0    Medicare Annual Wellness Visit Summary:  Reviewed patient's Family Medical History Reviewed and updated list of patient's medical providers Assessment of cognitive impairment was done Assessed patient's functional ability Established a written schedule for health screening [place] Completed and Reviewed  Exercise Activities and Dietary recommendations Goals    . Prevent Falls       Immunization History  Administered Date(s) Administered  . Pneumococcal Conjugate-13 03/11/2014  . Pneumococcal Polysaccharide-23 09/04/2012  . Zoster 03/30/2013    Health Maintenance  Topic Date Due  . DEXA SCAN  10/29/2012  . COLONOSCOPY  02/27/2016  . FOOT EXAM  11/21/2016  . INFLUENZA VACCINE  11/25/2017 (Originally 09/25/2016)  . TETANUS/TDAP  02/25/2018 (Originally 10/30/1966)  . HEMOGLOBIN A1C  03/20/2017  . MAMMOGRAM  05/06/2017  . OPHTHALMOLOGY EXAM  05/09/2017  . PNA vac Low Risk Adult (2 of 2 - PPSV23) 09/04/2017  . Hepatitis C Screening  Completed    Discussed health benefits of physical activity, and encouraged her to engage in regular exercise appropriate for her age and condition.    ------------------------------------------------------------------------------------------------------------  Assessment & Plan:  1. Medicare annual wellness visit, subsequent Measures satisfied  2. Essential (primary) hypertension controlled - CBC with Differential/Platelet - TSH  3. Type 2 diabetes mellitus with diabetic nephropathy, without long-term current use of insulin (HCC) Resume 2 metformin per day Recheck A1C and add medication if needed - Comprehensive metabolic panel - Hemoglobin A1c - Lipid panel - Microalbumin / creatinine urine ratio  4. Bariatric surgery status Not interested in adjusting her lap band although I encouraged her to do so to aid in DM control  5.  Encounter for screening mammogram for breast cancer Due in March - MM DIGITAL SCREENING BILATERAL; Future  6. Colon cancer screening Due for 10 yr follow up - Ambulatory referral to Gastroenterology  7. Tinea pedis of left foot May need prescription cream - pt to call back with the otc brand that she has tried  No orders of the defined types were placed in this encounter.   Partially dictated using Editor, commissioning. Any errors are unintentional.  Halina Maidens, MD [facility]  11/22/2016

## 2016-11-23 LAB — HEMOGLOBIN A1C
Est. average glucose Bld gHb Est-mCnc: 171 mg/dL
Hgb A1c MFr Bld: 7.6 % — ABNORMAL HIGH (ref 4.8–5.6)

## 2016-11-23 LAB — CBC WITH DIFFERENTIAL/PLATELET
Basophils Absolute: 0 x10E3/uL (ref 0.0–0.2)
Basos: 0 %
EOS (ABSOLUTE): 0.2 x10E3/uL (ref 0.0–0.4)
Eos: 4 %
Hematocrit: 45.5 % (ref 34.0–46.6)
Hemoglobin: 14.6 g/dL (ref 11.1–15.9)
Immature Grans (Abs): 0 x10E3/uL (ref 0.0–0.1)
Immature Granulocytes: 0 %
Lymphocytes Absolute: 1.1 x10E3/uL (ref 0.7–3.1)
Lymphs: 23 %
MCH: 27.5 pg (ref 26.6–33.0)
MCHC: 32.1 g/dL (ref 31.5–35.7)
MCV: 86 fL (ref 79–97)
Monocytes Absolute: 0.4 x10E3/uL (ref 0.1–0.9)
Monocytes: 8 %
Neutrophils Absolute: 2.9 x10E3/uL (ref 1.4–7.0)
Neutrophils: 65 %
Platelets: 242 x10E3/uL (ref 150–379)
RBC: 5.31 x10E6/uL — ABNORMAL HIGH (ref 3.77–5.28)
RDW: 14.7 % (ref 12.3–15.4)
WBC: 4.6 x10E3/uL (ref 3.4–10.8)

## 2016-11-23 LAB — LIPID PANEL
CHOLESTEROL TOTAL: 159 mg/dL (ref 100–199)
Chol/HDL Ratio: 2.5 ratio (ref 0.0–4.4)
HDL: 64 mg/dL (ref >39)
LDL Calculated: 75 mg/dL (ref 0–99)
Triglycerides: 102 mg/dL (ref 0–149)
VLDL Cholesterol Cal: 20 mg/dL (ref 5–40)

## 2016-11-23 LAB — COMPREHENSIVE METABOLIC PANEL
A/G RATIO: 1.5 (ref 1.2–2.2)
ALK PHOS: 66 IU/L (ref 39–117)
ALT: 32 IU/L (ref 0–32)
AST: 42 IU/L — AB (ref 0–40)
Albumin: 4.2 g/dL (ref 3.6–4.8)
BILIRUBIN TOTAL: 0.4 mg/dL (ref 0.0–1.2)
BUN/Creatinine Ratio: 13 (ref 12–28)
BUN: 10 mg/dL (ref 8–27)
CALCIUM: 9.9 mg/dL (ref 8.7–10.3)
CHLORIDE: 97 mmol/L (ref 96–106)
CO2: 25 mmol/L (ref 20–29)
Creatinine, Ser: 0.77 mg/dL (ref 0.57–1.00)
GFR calc Af Amer: 91 mL/min/{1.73_m2} (ref >59)
GFR calc non Af Amer: 79 mL/min/{1.73_m2} (ref >59)
Globulin, Total: 2.8 g/dL (ref 1.5–4.5)
Glucose: 128 mg/dL — ABNORMAL HIGH (ref 65–99)
POTASSIUM: 4.3 mmol/L (ref 3.5–5.2)
SODIUM: 141 mmol/L (ref 134–144)
Total Protein: 7 g/dL (ref 6.0–8.5)

## 2016-11-23 LAB — MICROALBUMIN / CREATININE URINE RATIO
CREATININE, UR: 254.3 mg/dL
MICROALBUM., U, RANDOM: 17.8 ug/mL
Microalb/Creat Ratio: 7 mg/g creat (ref 0.0–30.0)

## 2016-11-23 LAB — TSH: TSH: 2.16 u[IU]/mL (ref 0.450–4.500)

## 2017-02-03 ENCOUNTER — Other Ambulatory Visit: Payer: Self-pay

## 2017-02-03 DIAGNOSIS — Z1211 Encounter for screening for malignant neoplasm of colon: Secondary | ICD-10-CM

## 2017-02-27 ENCOUNTER — Other Ambulatory Visit: Payer: Self-pay

## 2017-02-27 DIAGNOSIS — Z1211 Encounter for screening for malignant neoplasm of colon: Secondary | ICD-10-CM

## 2017-03-02 ENCOUNTER — Encounter: Payer: Self-pay | Admitting: Internal Medicine

## 2017-03-03 ENCOUNTER — Other Ambulatory Visit: Payer: Self-pay

## 2017-03-03 MED ORDER — OLMESARTAN MEDOXOMIL-HCTZ 40-25 MG PO TABS: 1.0000 | ORAL_TABLET | Freq: Every day | ORAL | 1 refills | Status: DC

## 2017-03-03 MED ORDER — METFORMIN HCL 500 MG PO TABS: 500.0000 mg | ORAL_TABLET | Freq: Two times a day (BID) | ORAL | 1 refills | Status: DC

## 2017-03-06 ENCOUNTER — Encounter: Payer: Self-pay | Admitting: Emergency Medicine

## 2017-03-07 ENCOUNTER — Ambulatory Visit: Payer: Medicare Other | Admitting: Anesthesiology

## 2017-03-07 ENCOUNTER — Ambulatory Visit: Admission: RE | Admit: 2017-03-07 | Payer: Medicare Other | Source: Ambulatory Visit | Admitting: Gastroenterology

## 2017-03-07 ENCOUNTER — Encounter
Admission: RE | Disposition: A | Discharge status: Home / Self Care | Payer: Self-pay | Source: Ambulatory Visit | Attending: Gastroenterology

## 2017-03-07 ENCOUNTER — Encounter: Admission: RE | Payer: Self-pay | Source: Ambulatory Visit

## 2017-03-07 ENCOUNTER — Ambulatory Visit
Admission: RE | Admit: 2017-03-07 | Discharge: 2017-03-07 | Disposition: A | Discharge status: Home / Self Care | Payer: Medicare Other | Source: Ambulatory Visit | Attending: Gastroenterology | Admitting: Gastroenterology

## 2017-03-07 ENCOUNTER — Encounter: Payer: Self-pay | Admitting: Emergency Medicine

## 2017-03-07 DIAGNOSIS — I10 Essential (primary) hypertension: Secondary | ICD-10-CM | POA: Insufficient documentation

## 2017-03-07 DIAGNOSIS — D122 Benign neoplasm of ascending colon: Secondary | ICD-10-CM | POA: Insufficient documentation

## 2017-03-07 DIAGNOSIS — E669 Obesity, unspecified: Secondary | ICD-10-CM | POA: Diagnosis not present

## 2017-03-07 DIAGNOSIS — D126 Benign neoplasm of colon, unspecified: Secondary | ICD-10-CM | POA: Diagnosis not present

## 2017-03-07 DIAGNOSIS — E1121 Type 2 diabetes mellitus with diabetic nephropathy: Secondary | ICD-10-CM | POA: Diagnosis not present

## 2017-03-07 DIAGNOSIS — Z79899 Other long term (current) drug therapy: Secondary | ICD-10-CM | POA: Insufficient documentation

## 2017-03-07 DIAGNOSIS — K621 Rectal polyp: Secondary | ICD-10-CM | POA: Diagnosis not present

## 2017-03-07 DIAGNOSIS — K635 Polyp of colon: Secondary | ICD-10-CM | POA: Diagnosis not present

## 2017-03-07 DIAGNOSIS — Z7984 Long term (current) use of oral hypoglycemic drugs: Secondary | ICD-10-CM | POA: Diagnosis not present

## 2017-03-07 DIAGNOSIS — Z1211 Encounter for screening for malignant neoplasm of colon: Secondary | ICD-10-CM

## 2017-03-07 DIAGNOSIS — E119 Type 2 diabetes mellitus without complications: Secondary | ICD-10-CM | POA: Diagnosis not present

## 2017-03-07 DIAGNOSIS — Z6841 Body Mass Index (BMI) 40.0 and over, adult: Secondary | ICD-10-CM | POA: Diagnosis not present

## 2017-03-07 HISTORY — PX: COLONOSCOPY WITH PROPOFOL: SHX5780

## 2017-03-07 LAB — GLUCOSE, CAPILLARY: Glucose-Capillary: 142 mg/dL — ABNORMAL HIGH (ref 65–99)

## 2017-03-07 SURGERY — COLONOSCOPY WITH PROPOFOL
Anesthesia: General

## 2017-03-07 SURGERY — COLONOSCOPY WITH PROPOFOL
Anesthesia: Choice

## 2017-03-07 MED ORDER — SODIUM CHLORIDE 0.9 % IV SOLN
INTRAVENOUS | Status: DC
  Administered 2017-03-07: 09:00:00 via INTRAVENOUS

## 2017-03-07 MED ORDER — PROPOFOL 500 MG/50ML IV EMUL
INTRAVENOUS | Status: DC | PRN
  Administered 2017-03-07: 150 ug/kg/min via INTRAVENOUS

## 2017-03-07 MED ORDER — PROPOFOL 10 MG/ML IV BOLUS
INTRAVENOUS | Status: DC | PRN
  Administered 2017-03-07: 50 mg via INTRAVENOUS

## 2017-03-07 NOTE — Op Note (Signed)
[facility] Patient Name: Megan Shields Procedure Date: 03/07/2017 9:54 AM MRN: [id number] Account #: [id number] Date of Birth: 1945/11/27 Admit Type: Outpatient Age: 72 Room: [facility] ENDO ROOM 4 Gender: Female Note Status: Finalized Procedure:            Colonoscopy Indications:          Screening for colorectal malignant neoplasm Providers:            [name] MD, MD Referring MD:         Halina Maidens, MD (Referring MD) Medicines:            Monitored Anesthesia Care Complications:        No immediate complications. Procedure:            Pre-Anesthesia Assessment:                       - Prior to the procedure, a History and Physical was                        performed, and patient medications, allergies and                        sensitivities were reviewed. The patient's tolerance of                        previous anesthesia was reviewed.                       - The risks and benefits of the procedure and the                        sedation options and risks were discussed with the                        patient. All questions were answered and informed                        consent was obtained.                       - ASA Grade Assessment: III - A patient with severe                        systemic disease.                       After obtaining informed consent, the colonoscope was                        passed under direct vision. Throughout the procedure,                        the patient's blood pressure, pulse, and oxygen                        saturations were monitored continuously. The                        Colonoscope was introduced through the anus and  advanced to the the cecum, identified by the                        appendiceal orifice, IC valve and transillumination.                        The colonoscopy was performed with ease. The patient                        tolerated the procedure well. The  quality of the bowel                        preparation was good. Findings:      The perianal and digital rectal examinations were normal.      Three sessile polyps were found in the rectum and ascending colon. The       polyps were 5 to 6 mm in size. These polyps were removed with a cold       snare. Resection and retrieval were complete.      The exam was otherwise without abnormality on direct and retroflexion       views. Impression:           - Three 5 to 6 mm polyps in the rectum and in the                        ascending colon, removed with a cold snare. Resected                        and retrieved.                       - The examination was otherwise normal on direct and                        retroflexion views. Recommendation:       - Discharge patient to home (with escort).                       - Resume previous diet.                       - Continue present medications.                       - Await pathology results.                       - Repeat colonoscopy in 3 - 5 years for surveillance                        based on pathology results. Procedure Code(s):    --- Professional ---                       [phone], Colonoscopy, flexible; with removal of tumor(s),                        polyp(s), or other lesion(s) by snare technique Diagnosis Code(s):    --- Professional ---                       Z12.11, Encounter for  screening for malignant neoplasm                        of colon                       K62.1, Rectal polyp                       D12.2, Benign neoplasm of ascending colon CPT copyright 2016 American Medical Association. All rights reserved. The codes documented in this report are preliminary and upon coder review may  be revised to meet current compliance requirements. [name], MD [name] MD, MD 03/07/2017 10:20:11 AM This report has been signed electronically. Number of Addenda: 0 Note Initiated On: 03/07/2017 9:54 AM Scope Withdrawal Time: 0 hours  14 minutes 7 seconds  Total Procedure Duration: 0 hours 18 minutes 54 seconds       [facility]

## 2017-03-07 NOTE — Transfer of Care (Signed)
Immediate Anesthesia Transfer of Care Note  Patient: Megan Shields  Procedure(s) Performed: COLONOSCOPY WITH PROPOFOL (N/A )  Patient Location: PACU  Anesthesia Type:General  Level of Consciousness: sedated  Airway & Oxygen Therapy: Patient Spontanous Breathing and Patient connected to nasal cannula oxygen  Post-op Assessment: Report given to RN and Post -op Vital signs reviewed and stable  Post vital signs: Reviewed and stable  Last Vitals:  Vitals:   03/07/17 0851  BP: (!) 160/86  Pulse: 84  Resp: 18  Temp: (!) 35.6 C  SpO2: 100%    Last Pain:  Vitals:   03/07/17 0851  TempSrc: Tympanic         Complications: No apparent anesthesia complications

## 2017-03-07 NOTE — Anesthesia Post-op Follow-up Note (Signed)
Anesthesia QCDR form completed.        

## 2017-03-07 NOTE — Anesthesia Postprocedure Evaluation (Signed)
Anesthesia Post Note  Patient: Megan Shields  Procedure(s) Performed: COLONOSCOPY WITH PROPOFOL (N/A )  Patient location during evaluation: Endoscopy Anesthesia Type: General Level of consciousness: awake and alert Pain management: pain level controlled Vital Signs Assessment: post-procedure vital signs reviewed and stable Respiratory status: spontaneous breathing, nonlabored ventilation, respiratory function stable and patient connected to nasal cannula oxygen Cardiovascular status: blood pressure returned to baseline and stable Postop Assessment: no apparent nausea or vomiting Anesthetic complications: no     Last Vitals:  Vitals:   03/07/17 1022 03/07/17 1032  BP: (!) 105/41 (!) 110/49  Pulse: 84 74  Resp: 18 (!) 22  Temp: (!) 35.9 C   SpO2: 100% 98%    Last Pain:  Vitals:   03/07/17 1022  TempSrc: Tympanic                 Bertina Guthridge S

## 2017-03-07 NOTE — Anesthesia Preprocedure Evaluation (Signed)
Anesthesia Evaluation  Patient identified by MRN, date of birth, ID band Patient awake    Reviewed: Allergy & Precautions, NPO status , Patient's Chart, lab work & pertinent test results, reviewed documented beta blocker date and time   History of Anesthesia Complications (+) PONV and history of anesthetic complications  Airway Mallampati: III  TM Distance: >3 FB     Dental  (+) Chipped, Missing   Pulmonary           Cardiovascular hypertension, Pt. on medications      Neuro/Psych  Neuromuscular disease    GI/Hepatic   Endo/Other  diabetes, Type 2  Renal/GU Renal disease     Musculoskeletal  (+) Arthritis ,   Abdominal   Peds  Hematology   Anesthesia Other Findings Gastric bypass.  Reproductive/Obstetrics                             Anesthesia Physical Anesthesia Plan  ASA: III  Anesthesia Plan: General   Post-op Pain Management:    Induction: Intravenous  PONV Risk Score and Plan:   Airway Management Planned:   Additional Equipment:   Intra-op Plan:   Post-operative Plan:   Informed Consent: I have reviewed the patients History and Physical, chart, labs and discussed the procedure including the risks, benefits and alternatives for the proposed anesthesia with the patient or authorized representative who has indicated his/her understanding and acceptance.     Plan Discussed with: CRNA  Anesthesia Plan Comments:         Anesthesia Quick Evaluation

## 2017-03-07 NOTE — H&P (Signed)
[name], MD [address], [place], [place], [place], [address] [address], [place], [place], [place], [address] Phone: [phone]  Fax: [phone]  Primary Care Physician:  Glean Hess, MD   Pre-Procedure History & Physical: HPI:  Megan Shields is a 72 y.o. female is here for an colonoscopy.   Past Medical History:  Diagnosis Date  . Arthritis   . Diabetes mellitus without complication ([place])   . Hypertension   . Obesity   . PONV (postoperative nausea and vomiting)     Past Surgical History:  Procedure Laterality Date  . CARPAL TUNNEL RELEASE Bilateral 1985  . CHOLECYSTECTOMY    . DILATION AND CURETTAGE OF UTERUS  12/2011   benign endometrial polyp  . HALLUX VALGUS AUSTIN Right 09/02/2014   Procedure: [place];  Surgeon: Samara Deist, DPM;  Location: [facility] ORS;  Service: Podiatry;  Laterality: Right;  . HERNIA REPAIR    . LAPAROSCOPIC GASTRIC BANDING  2010   Lap Band  . lymph node removed Right   . TOTAL KNEE ARTHROPLASTY Bilateral 2003, 2004  . TOTAL KNEE ARTHROPLASTY Bilateral   . VENTRAL HERNIA REPAIR  2008    Prior to Admission medications   Medication Sig Start Date End Date Taking? Authorizing Provider  acetaminophen (TYLENOL) 500 MG tablet Take 1,000 mg by mouth at bedtime as needed.   Yes [provider]  BIONECT 0.2 % CREA Apply topically 2 (two) times daily. to affected area 11/15/15  Yes [provider]  celecoxib (CELEBREX) 200 MG capsule TAKE 1 CAPSULE DAILY 07/10/16  Yes Glean Hess, MD  cyclobenzaprine (FLEXERIL) 10 MG tablet Take 10 mg by mouth 3 (three) times daily as needed for muscle spasms.   Yes [provider]  metFORMIN (GLUCOPHAGE) 500 MG tablet Take 1 tablet (500 mg total) by mouth 2 (two) times daily with a meal. 03/03/17  Yes Glean Hess, MD  olmesartan-hydrochlorothiazide (BENICAR HCT) 40-25 MG tablet Take 1 tablet by mouth daily. 03/03/17  Yes Glean Hess, MD  glucose  blood test strip Place 1 each onto the skin 2 (two) times daily. Reported on 03/08/2015 12/03/13   [provider]    Allergies as of 02/27/2017 - Review Complete 11/22/2016  Allergen Reaction Noted  . Pneumococcal vaccines Hives 02/27/2015  . Codeine  08/01/2014  . Amoxicillin Rash 03/08/2015    Family History  Problem Relation Age of Onset  . Breast cancer Mother 19  . Breast cancer Paternal Aunt        3 pat aunts    Social History   Socioeconomic History  . Marital status: Married    Spouse name: Megan on file  . Number of children: Megan on file  . Years of education: Megan on file  . Highest education level: Megan on file  Social Needs  . Financial resource strain: Megan on file  . Food insecurity - worry: Megan on file  . Food insecurity - inability: Megan on file  . Transportation needs - medical: Megan on file  . Transportation needs - non-medical: Megan on file  Occupational History  . Megan on file  Tobacco Use  . Smoking status: Never Smoker  . Smokeless tobacco: Never Used  Substance and Sexual Activity  . Alcohol use: No    Alcohol/week: 0.0 oz  . Drug use: No  . Sexual activity: Megan on file  Other Topics Concern  . Megan on file  Social History Narrative  .  Megan on file    Review of Systems: See HPI, otherwise negative ROS  Physical Exam: BP (!) 160/86   Pulse 84   Temp (!) 96 F (35.6 C) (Tympanic)   Resp 18   Ht 5\' 4"  (1.626 m)   Wt (!) 301 lb (136.5 kg)   SpO2 100%   BMI 51.67 kg/m  General:   Alert,  pleasant and cooperative in NAD Head:  Normocephalic and atraumatic. Neck:  Supple; no masses or thyromegaly. Lungs:  Clear throughout to auscultation, normal respiratory effort.    Heart:  +S1, +S2, Regular rate and rhythm, No edema. Abdomen:  Soft, nontender and nondistended. Normal bowel sounds, without guarding, and without rebound.   Neurologic:  Alert and  oriented x4;  grossly normal neurologically.  Impression/Plan: [name] is here  for an colonoscopy to be performed for Screening colonoscopy average risk   Risks, benefits, limitations, and alternatives regarding  colonoscopy have been reviewed with the patient.  Questions have been answered.  All parties agreeable.   [name], MD  03/07/2017, 9:48 AM

## 2017-03-10 ENCOUNTER — Encounter: Payer: Self-pay | Admitting: Internal Medicine

## 2017-03-10 ENCOUNTER — Encounter: Payer: Self-pay | Admitting: Gastroenterology

## 2017-03-10 DIAGNOSIS — D126 Benign neoplasm of colon, unspecified: Secondary | ICD-10-CM | POA: Insufficient documentation

## 2017-03-10 LAB — SURGICAL PATHOLOGY

## 2017-03-14 ENCOUNTER — Other Ambulatory Visit: Payer: Self-pay

## 2017-03-14 ENCOUNTER — Encounter: Payer: Self-pay | Admitting: Gastroenterology

## 2017-03-25 ENCOUNTER — Encounter: Payer: Self-pay | Admitting: Internal Medicine

## 2017-03-25 ENCOUNTER — Ambulatory Visit (INDEPENDENT_AMBULATORY_CARE_PROVIDER_SITE_OTHER): Payer: Medicare Other | Admitting: Internal Medicine

## 2017-03-25 VITALS — BP 138/82 | HR 79 | Ht 64.0 in | Wt 307.0 lb

## 2017-03-25 DIAGNOSIS — E1121 Type 2 diabetes mellitus with diabetic nephropathy: Secondary | ICD-10-CM

## 2017-03-25 DIAGNOSIS — I10 Essential (primary) hypertension: Secondary | ICD-10-CM

## 2017-03-25 DIAGNOSIS — D126 Benign neoplasm of colon, unspecified: Secondary | ICD-10-CM | POA: Diagnosis not present

## 2017-03-25 NOTE — Progress Notes (Signed)
Date:  03/25/2017   Name:  Megan Shields   DOB:  February 23, 1946   MRN:  [id number]   Chief Complaint: Diabetes (BS this morning was 131.) Diabetes  She presents for her follow-up diabetic visit. She has type 2 diabetes mellitus. Her disease course has been stable. Pertinent negatives for hypoglycemia include no headaches or tremors. Pertinent negatives for diabetes include no chest pain, no fatigue, no polydipsia and no polyuria. Symptoms are stable. Current diabetic treatment includes oral agent (monotherapy). She is compliant with treatment all of the time. She monitors blood glucose at home 1-2 x per day. Her breakfast blood glucose is taken between 7-8 am. Her breakfast blood glucose range is generally 110-130 mg/dl. An ACE inhibitor/angiotensin II receptor blocker is being taken. Eye exam is current.   Lab Results  Component Value Date   HGBA1C 7.6 (H) 11/22/2016     Review of Systems  Constitutional: Negative for appetite change, fatigue, fever and unexpected weight change.  HENT: Negative for tinnitus and trouble swallowing.   Eyes: Negative for visual disturbance.  Respiratory: Negative for cough, chest tightness and shortness of breath.   Cardiovascular: Negative for chest pain, palpitations and leg swelling.  Gastrointestinal: Negative for abdominal pain.  Endocrine: Negative for polydipsia and polyuria.  Genitourinary: Negative for dysuria and hematuria.  Musculoskeletal: Negative for arthralgias.  Skin: Negative for color change and rash.  Neurological: Negative for tremors, numbness and headaches.  Psychiatric/Behavioral: Negative for dysphoric mood.    Patient Active Problem List   Diagnosis Date Noted  . Tubular adenoma of colon 03/10/2017  . Tinea pedis of left foot 11/22/2016  . Shoulder contusion 11/22/2015  . Type 2 diabetes mellitus with diabetic nephropathy, without long-term current use of insulin ([place]) 07/06/2015  . Essential (primary) hypertension  08/01/2014  . 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 Arthritis of knee, degenerative 08/01/2014  . Low back strain 08/01/2014    Prior to Admission medications   Medication Sig Start Date End Date Taking? Authorizing Provider  acetaminophen (TYLENOL) 500 MG tablet Take 1,000 mg by mouth at bedtime as needed.   Yes [provider]  BIONECT 0.2 % CREA Apply topically 2 (two) times daily. to affected area 11/15/15  Yes [provider]  celecoxib (CELEBREX) 200 MG capsule TAKE 1 CAPSULE DAILY 07/10/16  Yes Glean Hess, MD  cyclobenzaprine (FLEXERIL) 10 MG tablet Take 10 mg by mouth 3 (three) times daily as needed for muscle spasms.   Yes [provider]  glucose blood test strip Place 1 each onto the skin 2 (two) times daily. Reported on 03/08/2015 12/03/13  Yes [provider]  lidocaine (LIDODERM) 5 % Place onto the skin. 01/01/16  Yes [provider]  metFORMIN (GLUCOPHAGE) 500 MG tablet Take 1 tablet (500 mg total) by mouth 2 (two) times daily with a meal. 03/03/17  Yes Glean Hess, MD  olmesartan-hydrochlorothiazide (BENICAR HCT) 40-25 MG tablet Take 1 tablet by mouth daily. 03/03/17  Yes Glean Hess, MD    Allergies  Allergen Reactions  . Pneumococcal Vaccines Hives  . Codeine     Other reaction(s): drowsiness  . Amoxicillin Rash    Past Surgical History:  Procedure Laterality Date  . CARPAL TUNNEL RELEASE Bilateral 1985  . CHOLECYSTECTOMY    . COLONOSCOPY WITH PROPOFOL N/A 03/07/2017   Procedure: COLONOSCOPY WITH PROPOFOL;  Surgeon: [name], MD;  [facility]: [facility] END[facility]COPY;  Service: Gastroenterology;  Laterality: N/A;  . DILATION AND CURETTAGE OF  UTERUS  12/2011   benign endometrial polyp  . HALLUX VALGUS AUSTIN Right 09/02/2014   Procedure: [place];  Surgeon: Samara Deist, DPM;  Location: [facility] ORS;  Service: Podiatry;  Laterality: Right;  . HERNIA REPAIR    . LAPAROSCOPIC GASTRIC BANDING  2010   Lap Band  .  lymph node removed Right   . TOTAL KNEE ARTHROPLASTY Bilateral 2003, 2004  . TOTAL KNEE ARTHROPLASTY Bilateral   . VENTRAL HERNIA REPAIR  2008    Social History   Tobacco Use  . Smoking status: Never Smoker  . Smokeless tobacco: Never Used  Substance Use Topics  . Alcohol use: No    Alcohol/week: 0.0 oz  . Drug use: No     Medication list has been reviewed and updated.  PHQ 2/9 Scores 11/22/2016 11/22/2015 03/08/2015  PHQ - 2 Score 0 0 0    Physical Exam  Constitutional: She is oriented to person, place, and time. She appears well-developed. No distress.  HENT:  Head: Normocephalic and atraumatic.  Neck: Normal range of motion. Neck supple.  Cardiovascular: Normal rate, regular rhythm and normal heart sounds.  Pulmonary/Chest: Effort normal and breath sounds normal. No respiratory distress. She has no wheezes.  Musculoskeletal: Normal range of motion. She exhibits no edema or tenderness.  Neurological: She is alert and oriented to person, place, and time.  Skin: Skin is warm and dry. No rash noted.  Psychiatric: She has a normal mood and affect. Her behavior is normal. Thought content normal.  Nursing note and vitals reviewed.   BP 138/82   Pulse 79   Ht 5\' 4"  (1.626 m)   Wt (!) 307 lb (139.3 kg)   SpO2 95%   BMI 52.70 kg/m   Assessment and Plan: 1. Type 2 diabetes mellitus with diabetic nephropathy, without long-term current use of insulin (HCC) Doing well on current medication - Hemoglobin A1c  2. Essential (primary) hypertension controlled   No orders of the defined types were placed in this encounter.   Partially dictated using Editor, commissioning. Any errors are unintentional.  Halina Maidens, MD [facility]  03/25/2017

## 2017-03-26 LAB — HEMOGLOBIN A1C
ESTIMATED AVERAGE GLUCOSE: 177 mg/dL
HEMOGLOBIN A1C: 7.8 % — AB (ref 4.8–5.6)

## 2017-03-26 NOTE — Progress Notes (Signed)
Patient informed of labs. Stated she would like to stay on current regimen and do better with her eating. Told her I will call her back tomorrow with your response. Please Advise.

## 2017-05-07 ENCOUNTER — Ambulatory Visit
Admission: RE | Admit: 2017-05-07 | Discharge: 2017-05-07 | Disposition: A | Discharge status: Home / Self Care | Payer: Medicare Other | Source: Ambulatory Visit | Attending: Internal Medicine | Admitting: Internal Medicine

## 2017-05-07 DIAGNOSIS — Z1231 Encounter for screening mammogram for malignant neoplasm of breast: Secondary | ICD-10-CM | POA: Diagnosis not present

## 2017-06-14 ENCOUNTER — Encounter: Payer: Self-pay | Admitting: Internal Medicine

## 2017-06-16 ENCOUNTER — Other Ambulatory Visit: Payer: Self-pay

## 2017-06-16 MED ORDER — GLUCOSE BLOOD VI STRP: ORAL_STRIP | 12 refills | Status: DC

## 2017-06-16 MED ORDER — FREESTYLE LANCETS MISC: 12 refills | Status: DC

## 2017-06-21 ENCOUNTER — Other Ambulatory Visit: Payer: Self-pay | Admitting: Internal Medicine

## 2017-07-30 DIAGNOSIS — L718 Other rosacea: Secondary | ICD-10-CM | POA: Diagnosis not present

## 2017-07-30 DIAGNOSIS — L918 Other hypertrophic disorders of the skin: Secondary | ICD-10-CM | POA: Diagnosis not present

## 2017-07-30 DIAGNOSIS — L578 Other skin changes due to chronic exposure to nonionizing radiation: Secondary | ICD-10-CM | POA: Diagnosis not present

## 2017-07-30 DIAGNOSIS — L570 Actinic keratosis: Secondary | ICD-10-CM | POA: Diagnosis not present

## 2017-08-21 DIAGNOSIS — H02831 Dermatochalasis of right upper eyelid: Secondary | ICD-10-CM | POA: Diagnosis not present

## 2017-09-02 ENCOUNTER — Other Ambulatory Visit: Payer: Self-pay | Admitting: Internal Medicine

## 2017-09-23 ENCOUNTER — Encounter: Payer: Self-pay | Admitting: Internal Medicine

## 2017-09-23 ENCOUNTER — Ambulatory Visit (INDEPENDENT_AMBULATORY_CARE_PROVIDER_SITE_OTHER): Payer: Medicare Other | Admitting: Internal Medicine

## 2017-09-23 VITALS — BP 138/76 | HR 64 | Ht 64.0 in | Wt 299.0 lb

## 2017-09-23 DIAGNOSIS — Z9884 Bariatric surgery status: Secondary | ICD-10-CM

## 2017-09-23 DIAGNOSIS — B353 Tinea pedis: Secondary | ICD-10-CM | POA: Diagnosis not present

## 2017-09-23 DIAGNOSIS — I10 Essential (primary) hypertension: Secondary | ICD-10-CM

## 2017-09-23 DIAGNOSIS — E1121 Type 2 diabetes mellitus with diabetic nephropathy: Secondary | ICD-10-CM

## 2017-09-23 DIAGNOSIS — M170 Bilateral primary osteoarthritis of knee: Secondary | ICD-10-CM

## 2017-09-23 NOTE — Progress Notes (Signed)
Date:  09/23/2017   Name:  Megan Shields   DOB:  1945-11-09   MRN:  [id number]   Chief Complaint: Diabetes and Hypertension Hypertension  This is a chronic problem. The problem is controlled. Pertinent negatives include no chest pain, headaches, palpitations or shortness of breath. Risk factors for coronary artery disease include diabetes mellitus. Past treatments include angiotensin blockers and diuretics. The current treatment provides significant improvement.  Diabetes  She presents for her follow-up diabetic visit. She has type 2 diabetes mellitus. Her disease course has been stable. Pertinent negatives for hypoglycemia include no dizziness, headaches, nervousness/anxiousness or tremors. Pertinent negatives for diabetes include no chest pain, no fatigue, no polydipsia and no polyuria. Risk factors for coronary artery disease include dyslipidemia (LDL is 75 - long discussion regarding indications for statin therapy and its benefits.  Pt remains adamant about not taking more medication.). Current diabetic treatment includes oral agent (monotherapy). She is compliant with treatment all of the time. Her weight is decreasing steadily. She monitors blood glucose at home 1-2 x per day. There is no change in her home blood glucose trend. Her breakfast blood glucose is taken between 7-8 am. Her breakfast blood glucose range is generally 130-140 mg/dl. An ACE inhibitor/angiotensin II receptor blocker is being taken.  Rash  This is a recurrent problem. The affected locations include the left foot. The rash is characterized by peeling and redness. Pertinent negatives include no congestion, cough, diarrhea, fatigue, fever, shortness of breath or vomiting. Treatments tried: antifungal cream.  Knee Pain   There was no injury mechanism. The pain is present in the left knee and right knee. The quality of the pain is described as aching. The pain is moderate. The pain has been fluctuating since onset. She has  tried NSAIDs for the symptoms. The treatment provided significant relief.   Lab Results  Component Value Date   HGBA1C 7.8 (H) 03/25/2017   Lab Results  Component Value Date   CHOL 159 11/22/2016   HDL 64 11/22/2016   LDLCALC 75 11/22/2016   TRIG 102 11/22/2016   CHOLHDL 2.5 11/22/2016   Lab Results  Component Value Date   CREATININE 0.77 11/22/2016   BUN 10 11/22/2016   NA 141 11/22/2016   K 4.3 11/22/2016   CL 97 11/22/2016   CO2 25 11/22/2016      Review of Systems  Constitutional: Negative for chills, fatigue and fever.  HENT: Negative for congestion, hearing loss, tinnitus, trouble swallowing and voice change.   Eyes: Negative for visual disturbance.  Respiratory: Negative for cough, chest tightness, shortness of breath and wheezing.   Cardiovascular: Negative for chest pain, palpitations and leg swelling.  Gastrointestinal: Negative for abdominal pain, constipation, diarrhea and vomiting.  Endocrine: Negative for polydipsia and polyuria.  Genitourinary: Negative for dysuria, frequency, genital sores, vaginal bleeding and vaginal discharge.  Musculoskeletal: Positive for arthralgias. Negative for gait problem and joint swelling.  Skin: Positive for rash. Negative for color change.  Neurological: Negative for dizziness, tremors, light-headedness and headaches.  Hematological: Negative for adenopathy. Does not bruise/bleed easily.  Psychiatric/Behavioral: Negative for dysphoric mood and sleep disturbance. The patient is not nervous/anxious.     Patient Active Problem List   Diagnosis Date Noted  . Tubular adenoma of colon 03/10/2017  . Tinea pedis of left foot 11/22/2016  . Shoulder contusion 11/22/2015  . Type 2 diabetes mellitus with diabetic nephropathy, without long-term current use of insulin ([place]) 07/06/2015  . Essential (primary) hypertension 08/01/2014  .  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 Arthritis of knee, degenerative 08/01/2014  . Low back strain  08/01/2014    Prior to Admission medications   Medication Sig Start Date End Date Taking? Authorizing Provider  acetaminophen (TYLENOL) 500 MG tablet Take 1,000 mg by mouth at bedtime as needed.    [provider]  BENICAR HCT 40-25 MG tablet TAKE 1 TABLET DAILY 09/02/17   Glean Hess, MD  BIONECT 0.2 % CREA Apply topically 2 (two) times daily. to affected area 11/15/15   [provider]  celecoxib (CELEBREX) 200 MG capsule TAKE 1 CAPSULE DAILY 06/22/17   Glean Hess, MD  cyclobenzaprine (FLEXERIL) 10 MG tablet Take 10 mg by mouth 3 (three) times daily as needed for muscle spasms.    [provider]  glucose blood (FREESTYLE LITE) test strip Test Blood Sugar Twice Daily. 06/16/17   Glean Hess, MD  Lancets (FREESTYLE) lancets Test Blood Sugar twice daily. 06/16/17   Glean Hess, MD  lidocaine (LIDODERM) 5 % Place onto the skin. 01/01/16   [provider]  metFORMIN (GLUCOPHAGE) 500 MG tablet TAKE 1 TABLET TWICE A DAY WITH MEALS 09/02/17   Glean Hess, MD    Allergies  Allergen Reactions  . Pneumococcal Vaccines Hives  . Codeine     Other reaction(s): drowsiness  . Amoxicillin Rash    Past Surgical History:  Procedure Laterality Date  . CARPAL TUNNEL RELEASE Bilateral 1985  . CHOLECYSTECTOMY    . COLONOSCOPY WITH PROPOFOL N/A 03/07/2017   Procedure: COLONOSCOPY WITH PROPOFOL;  Surgeon: [name], MD;  Location[facility] ENDOSCOPY;  Service: Gastroenterology;  Laterality: N/A;  . DILATION AND CURETTAGE OF UTERUS  12/2011   benign endometrial polyp  . HALLUX VALGUS AUSTIN Right 09/02/2014   Procedure: [place];  Surgeon: Samara Deist, DPM;  Location: [facility] ORS;  Service: Podiatry;  Laterality: Right;  . HERNIA REPAIR    . LAPAROSCOPIC GASTRIC BANDING  2010   Lap Band  . lymph node removed Right   . TOTAL KNEE ARTHROPLASTY Bilateral 2003, 2004  . TOTAL KNEE ARTHROPLASTY Bilateral   . VENTRAL HERNIA REPAIR  2008     Social History   Tobacco Use  . Smoking status: Never Smoker  . Smokeless tobacco: Never Used  Substance Use Topics  . Alcohol use: No    Alcohol/week: 0.0 oz  . Drug use: No     Medication list has been reviewed and updated.  Current Meds  Medication Sig  . acetaminophen (TYLENOL) 500 MG tablet Take 1,000 mg by mouth at bedtime as needed.  Marland Kitchen BENICAR HCT 40-25 MG tablet TAKE 1 TABLET DAILY  . BIONECT 0.2 % CREA Apply topically 2 (two) times daily. to affected area  . celecoxib (CELEBREX) 200 MG capsule TAKE 1 CAPSULE DAILY  . cyclobenzaprine (FLEXERIL) 10 MG tablet Take 10 mg by mouth 3 (three) times daily as needed for muscle spasms.  Marland Kitchen glucose blood (FREESTYLE LITE) test strip Test Blood Sugar Twice Daily.  . Lancets (FREESTYLE) lancets Test Blood Sugar twice daily.  Marland Kitchen lidocaine (LIDODERM) 5 % Place onto the skin.  . metFORMIN (GLUCOPHAGE) 500 MG tablet TAKE 1 TABLET TWICE A DAY WITH MEALS    PHQ 2/9 Scores 09/23/2017 11/22/2016 11/22/2015 03/08/2015  PHQ - 2 Score 0 0 0 0    Physical Exam  Constitutional: She is oriented to person, place, and time. She appears well-developed and well-nourished. No distress.  HENT:  Head: Normocephalic and  atraumatic.  Right Ear: Tympanic membrane and ear canal normal.  Left Ear: Tympanic membrane and ear canal normal.  Nose: Right sinus exhibits no maxillary sinus tenderness. Left sinus exhibits no maxillary sinus tenderness.  Mouth/Throat: Uvula is midline and oropharynx is clear and moist.  Eyes: Conjunctivae and EOM are normal. Right eye exhibits no discharge. Left eye exhibits no discharge. No scleral icterus.  Neck: Normal range of motion. Carotid bruit is not present. No erythema present. No thyromegaly present.  Cardiovascular: Normal rate, regular rhythm, normal heart sounds and normal pulses.  Pulmonary/Chest: Effort normal. No respiratory distress. She has no wheezes. Right breast exhibits no mass, no nipple discharge, no skin  change and no tenderness. Left breast exhibits no mass, no nipple discharge, no skin change and no tenderness.  Abdominal: Soft. Bowel sounds are normal. There is no hepatosplenomegaly. There is no tenderness. There is no CVA tenderness.  Musculoskeletal: She exhibits no edema or tenderness.  Lymphadenopathy:    She has no cervical adenopathy.    She has no axillary adenopathy.  Neurological: She is alert and oriented to person, place, and time. She has normal reflexes. No cranial nerve deficit or sensory deficit.  Skin: Skin is warm, dry and intact. Rash noted.  Psychiatric: She has a normal mood and affect. Her speech is normal and behavior is normal. Thought content normal.  Nursing note and vitals reviewed.   BP 138/76   Pulse 64   Ht 5\' 4"  (1.626 m)   Wt 299 lb (135.6 kg)   BMI 51.32 kg/m   Assessment and Plan: 1. Essential (primary) hypertension controlled - CBC with Differential/Platelet - TSH - POCT urinalysis dipstick  2. Type 2 diabetes mellitus with diabetic nephropathy, without long-term current use of insulin (HCC) Doing well with oral medication Continue exercise and weight loss measures - Comprehensive metabolic panel - Hemoglobin A1c - Lipid panel  3. Tinea pedis of left foot Continue over the counter antifungal - use for 2 weeks after rash has resolved  4. Bariatric surgery status stable  5. Primary osteoarthritis of both knees On Celebrex with good control of sx   No orders of the defined types were placed in this encounter.   Partially dictated using Editor, commissioning. Any errors are unintentional.  Halina Maidens, MD [facility]  09/23/2017

## 2017-09-24 LAB — CBC WITH DIFFERENTIAL/PLATELET
Basophils Absolute: 0 10*3/uL (ref 0.0–0.2)
Basos: 1 %
EOS (ABSOLUTE): 0.2 10*3/uL (ref 0.0–0.4)
EOS: 5 %
HEMATOCRIT: 44.8 % (ref 34.0–46.6)
HEMOGLOBIN: 14.7 g/dL (ref 11.1–15.9)
IMMATURE GRANS (ABS): 0 10*3/uL (ref 0.0–0.1)
IMMATURE GRANULOCYTES: 0 %
LYMPHS ABS: 1.3 10*3/uL (ref 0.7–3.1)
LYMPHS: 30 %
MCH: 27.6 pg (ref 26.6–33.0)
MCHC: 32.8 g/dL (ref 31.5–35.7)
MCV: 84 fL (ref 79–97)
MONOCYTES: 9 %
Monocytes Absolute: 0.4 10*3/uL (ref 0.1–0.9)
Neutrophils Absolute: 2.4 10*3/uL (ref 1.4–7.0)
Neutrophils: 55 %
Platelets: 270 10*3/uL (ref 150–450)
RBC: 5.33 x10E6/uL — AB (ref 3.77–5.28)
RDW: 14.5 % (ref 12.3–15.4)
WBC: 4.3 10*3/uL (ref 3.4–10.8)

## 2017-09-24 LAB — COMPREHENSIVE METABOLIC PANEL
ALK PHOS: 65 IU/L (ref 39–117)
ALT: 22 IU/L (ref 0–32)
AST: 28 IU/L (ref 0–40)
Albumin/Globulin Ratio: 1.4 (ref 1.2–2.2)
Albumin: 4.4 g/dL (ref 3.6–4.8)
BUN/Creatinine Ratio: 23 (ref 12–28)
BUN: 18 mg/dL (ref 8–27)
Bilirubin Total: 0.5 mg/dL (ref 0.0–1.2)
CALCIUM: 10.1 mg/dL (ref 8.7–10.3)
CO2: 25 mmol/L (ref 20–29)
CREATININE: 0.8 mg/dL (ref 0.57–1.00)
Chloride: 96 mmol/L (ref 96–106)
GFR calc Af Amer: 87 mL/min/{1.73_m2} (ref >59)
GFR, EST NON AFRICAN AMERICAN: 75 mL/min/{1.73_m2} (ref >59)
GLUCOSE: 126 mg/dL — AB (ref 65–99)
Globulin, Total: 3.2 g/dL (ref 1.5–4.5)
Potassium: 4.6 mmol/L (ref 3.5–5.2)
Sodium: 139 mmol/L (ref 134–144)
Total Protein: 7.6 g/dL (ref 6.0–8.5)

## 2017-09-24 LAB — LIPID PANEL
Chol/HDL Ratio: 2.7 ratio (ref 0.0–4.4)
Cholesterol, Total: 187 mg/dL (ref 100–199)
HDL: 69 mg/dL (ref >39)
LDL Calculated: 99 mg/dL (ref 0–99)
TRIGLYCERIDES: 97 mg/dL (ref 0–149)
VLDL Cholesterol Cal: 19 mg/dL (ref 5–40)

## 2017-09-24 LAB — HEMOGLOBIN A1C
ESTIMATED AVERAGE GLUCOSE: 166 mg/dL
HEMOGLOBIN A1C: 7.4 % — AB (ref 4.8–5.6)

## 2017-09-24 LAB — TSH: TSH: 1.42 u[IU]/mL (ref 0.450–4.500)

## 2017-09-30 ENCOUNTER — Encounter: Payer: Medicare Other | Admitting: Internal Medicine

## 2017-11-24 ENCOUNTER — Ambulatory Visit: Payer: Medicare Other

## 2017-12-16 DIAGNOSIS — R112 Nausea with vomiting, unspecified: Secondary | ICD-10-CM | POA: Diagnosis not present

## 2017-12-16 DIAGNOSIS — E669 Obesity, unspecified: Secondary | ICD-10-CM | POA: Diagnosis not present

## 2017-12-16 DIAGNOSIS — I10 Essential (primary) hypertension: Secondary | ICD-10-CM | POA: Diagnosis not present

## 2017-12-16 DIAGNOSIS — E1169 Type 2 diabetes mellitus with other specified complication: Secondary | ICD-10-CM | POA: Diagnosis not present

## 2017-12-19 DIAGNOSIS — E119 Type 2 diabetes mellitus without complications: Secondary | ICD-10-CM | POA: Diagnosis not present

## 2017-12-19 DIAGNOSIS — Z7984 Long term (current) use of oral hypoglycemic drugs: Secondary | ICD-10-CM | POA: Diagnosis not present

## 2017-12-19 DIAGNOSIS — Z9884 Bariatric surgery status: Secondary | ICD-10-CM | POA: Diagnosis not present

## 2017-12-19 DIAGNOSIS — E669 Obesity, unspecified: Secondary | ICD-10-CM | POA: Diagnosis not present

## 2017-12-19 DIAGNOSIS — H02831 Dermatochalasis of right upper eyelid: Secondary | ICD-10-CM | POA: Diagnosis not present

## 2017-12-19 DIAGNOSIS — Z880 Allergy status to penicillin: Secondary | ICD-10-CM | POA: Diagnosis not present

## 2017-12-19 DIAGNOSIS — I10 Essential (primary) hypertension: Secondary | ICD-10-CM | POA: Diagnosis not present

## 2017-12-19 DIAGNOSIS — H02834 Dermatochalasis of left upper eyelid: Secondary | ICD-10-CM | POA: Diagnosis not present

## 2017-12-19 HISTORY — PX: BLEPHAROPLASTY: SUR158

## 2017-12-20 ENCOUNTER — Encounter: Payer: Self-pay | Admitting: Internal Medicine

## 2018-01-12 ENCOUNTER — Other Ambulatory Visit: Payer: Self-pay

## 2018-01-12 ENCOUNTER — Encounter: Payer: Self-pay | Admitting: Internal Medicine

## 2018-01-12 MED ORDER — METFORMIN HCL 500 MG PO TABS: 500.0000 mg | ORAL_TABLET | Freq: Three times a day (TID) | ORAL | 0 refills | Status: DC

## 2018-01-13 ENCOUNTER — Other Ambulatory Visit: Payer: Self-pay

## 2018-02-20 ENCOUNTER — Encounter: Payer: Self-pay | Admitting: Internal Medicine

## 2018-02-20 ENCOUNTER — Other Ambulatory Visit: Payer: Self-pay

## 2018-02-20 MED ORDER — OLMESARTAN MEDOXOMIL-HCTZ 40-25 MG PO TABS: 1.0000 | ORAL_TABLET | Freq: Every day | ORAL | 0 refills | Status: DC

## 2018-03-25 ENCOUNTER — Encounter: Payer: Self-pay | Admitting: Internal Medicine

## 2018-03-25 ENCOUNTER — Other Ambulatory Visit: Payer: Self-pay

## 2018-03-25 ENCOUNTER — Ambulatory Visit (INDEPENDENT_AMBULATORY_CARE_PROVIDER_SITE_OTHER): Payer: Medicare Other | Admitting: Internal Medicine

## 2018-03-25 VITALS — BP 114/80 | HR 44 | Ht 64.0 in | Wt 304.0 lb

## 2018-03-25 DIAGNOSIS — I10 Essential (primary) hypertension: Secondary | ICD-10-CM

## 2018-03-25 DIAGNOSIS — R001 Bradycardia, unspecified: Secondary | ICD-10-CM

## 2018-03-25 DIAGNOSIS — I493 Ventricular premature depolarization: Secondary | ICD-10-CM | POA: Diagnosis not present

## 2018-03-25 DIAGNOSIS — E1121 Type 2 diabetes mellitus with diabetic nephropathy: Secondary | ICD-10-CM | POA: Diagnosis not present

## 2018-03-25 NOTE — Progress Notes (Signed)
Date:  03/25/2018   Name:  Megan Shields   DOB:  [birth year]   MRN:  [id number]   Chief Complaint: Hypertension and Diabetes Bradycardia/ectopy - noted on check in.  Patient states that she feels well.  When pressed, maybe slightly less energy over the past few weeks but caring for husband after hip surgery.  No dizziness, SOB, chest pain.  No sensation of palpitations or rapid heart beat.  No new medications, otc or otherwise, no herbal therapy, no diet pills, no change in caffeine intake.  Hypertension  This is a chronic problem. The problem is unchanged. The problem is controlled. Pertinent negatives include no chest pain, headaches, palpitations or shortness of breath. Past treatments include angiotensin blockers and diuretics. The current treatment provides significant improvement.  Diabetes  She presents for her follow-up diabetic visit. She has type 2 diabetes mellitus. Pertinent negatives for hypoglycemia include no headaches or tremors. Associated symptoms include fatigue (slight decrease in energy). Pertinent negatives for diabetes include no chest pain, no polydipsia and no polyuria. Current diabetic treatment includes oral agent (monotherapy).   Lab Results  Component Value Date   HGBA1C 7.4 (H) 09/23/2017    Review of Systems  Constitutional: Positive for fatigue (slight decrease in energy). Negative for appetite change, fever and unexpected weight change.  HENT: Negative for tinnitus and trouble swallowing.   Eyes: Negative for visual disturbance.  Respiratory: Negative for cough, chest tightness, shortness of breath and wheezing.   Cardiovascular: Negative for chest pain, palpitations and leg swelling.  Gastrointestinal: Negative for abdominal pain.  Endocrine: Negative for polydipsia and polyuria.  Genitourinary: Negative for dysuria and hematuria.  Musculoskeletal: Negative for arthralgias.  Neurological: Negative for tremors, numbness and headaches.    Psychiatric/Behavioral: Negative for dysphoric mood.    Patient Active Problem List   Diagnosis Date Noted  . Tubular adenoma of colon 03/10/2017  . Tinea pedis of left foot 11/22/2016  . Shoulder contusion 11/22/2015  . Type 2 diabetes mellitus with diabetic nephropathy, without long-term current use of insulin ([place]) 07/06/2015  . Essential (primary) hypertension 08/01/2014  . 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 Arthritis of knee, degenerative 08/01/2014  . Low back strain 08/01/2014    Allergies  Allergen Reactions  . Pneumococcal Vaccines Hives  . Codeine     Other reaction(s): drowsiness  . Amoxicillin Rash    Past Surgical History:  Procedure Laterality Date  . BLEPHAROPLASTY Bilateral 12/19/2017  . CARPAL TUNNEL RELEASE Bilateral 1985  . CHOLECYSTECTOMY    . COLONOSCOPY WITH PROPOFOL N/A 03/07/2017   Procedure: COLONOSCOPY WITH PROPOFOL;  Surgeon: [name], MD;  Location: [facility] ENDOSCOPY;  Service: Gastroenterology;  Laterality: N/A;  . DILATION AND CURETTAGE OF UTERUS  12/2011   benign endometrial polyp  . HALLUX VALGUS AUSTIN Right 09/02/2014   Procedure: [place];  Surgeon: Samara Deist, DPM;  Location: [facility] ORS;  Service: Podiatry;  Laterality: Right;  . HERNIA REPAIR    . LAPAROSCOPIC GASTRIC BANDING  2010   Lap Band  . lymph node removed Right   . TOTAL KNEE ARTHROPLASTY Bilateral 2003, 2004  . TOTAL KNEE ARTHROPLASTY Bilateral   . VENTRAL HERNIA REPAIR  2008    Social History   Tobacco Use  . Smoking status: Never Smoker  . Smokeless tobacco: Never Used  Substance Use Topics  . Alcohol use: No    Alcohol/week: 0.0 standard drinks  . Drug use: No     Medication list has been reviewed and  updated.  Current Meds  Medication Sig  . acetaminophen (TYLENOL) 500 MG tablet Take 1,000 mg by mouth at bedtime as needed.  Marland Kitchen BIONECT 0.2 % CREA Apply topically 2 (two) times daily. to affected area  . celecoxib (CELEBREX) 200 MG capsule TAKE  1 CAPSULE DAILY  . cyclobenzaprine (FLEXERIL) 10 MG tablet Take 10 mg by mouth 3 (three) times daily as needed for muscle spasms.  Marland Kitchen glucose blood (FREESTYLE LITE) test strip Test Blood Sugar Twice Daily.  . Lancets (FREESTYLE) lancets Test Blood Sugar twice daily.  Marland Kitchen lidocaine (LIDODERM) 5 % Place onto the skin.  . metFORMIN (GLUCOPHAGE) 500 MG tablet Take 1 tablet (500 mg total) by mouth 3 (three) times daily after meals.  Marland Kitchen olmesartan-hydrochlorothiazide (BENICAR HCT) 40-25 MG tablet Take 1 tablet by mouth daily.    PHQ 2/9 Scores 03/25/2018 09/23/2017 11/22/2016 11/22/2015  PHQ - 2 Score 0 0 0 0   Wt Readings from Last 3 Encounters:  03/25/18 (!) 304 lb (137.9 kg)  09/23/17 299 lb (135.6 kg)  03/25/17 (!) 307 lb (139.3 kg)     Physical Exam Vitals signs and nursing note reviewed.  Constitutional:      General: She is not in acute distress.    Appearance: She is well-developed.  HENT:     Head: Normocephalic and atraumatic.  Eyes:     Pupils: Pupils are equal, round, and reactive to light.  Neck:     Musculoskeletal: Normal range of motion and neck supple.     Vascular: No carotid bruit.  Cardiovascular:     Rate and Rhythm: Normal rate. Frequent extrasystoles are present.    Pulses: Normal pulses.     Heart sounds: Normal heart sounds. No murmur.  Pulmonary:     Effort: Pulmonary effort is normal. No respiratory distress.     Breath sounds: Normal breath sounds.  Musculoskeletal: Normal range of motion.     Right lower leg: No edema.     Left lower leg: No edema.  Lymphadenopathy:     Cervical: No cervical adenopathy.  Skin:    General: Skin is warm and dry.     Findings: No rash.  Neurological:     Mental Status: She is alert and oriented to person, place, and time.  Psychiatric:        Behavior: Behavior normal.        Thought Content: Thought content normal.     BP 114/80   Pulse (!) 44   Ht 5\' 4"  (1.626 m)   Wt (!) 304 lb (137.9 kg)   SpO2 96%   BMI  52.18 kg/m   Assessment and Plan: 1. Bradycardia Average HR 75 On no medications that would contribute - EKG 12-Lead - Sinus  Rhythm  -Frequent pvcs -ventricular trigeminy  -Poor R-wave progression -may be secondary to pulmonary disease   consider old anterior infarct. (no significant change from 2016 other than PVCs)  Low voltage and rightward axis -possible pulmonary disease.  - Ambulatory referral to Cardiology  2. Frequent unifocal PVCs Asymptomatic - recommend going to ED if any chest pain, sob, presyncope, dizziness, etc - Ambulatory referral to Cardiology - Magnesium  3. Essential (primary) hypertension controlled - CBC with Differential/Platelet - TSH  4. Type 2 diabetes mellitus with diabetic nephropathy, without long-term current use of insulin ([place]) Continue current medication Request eye exam report - Comprehensive metabolic panel - Hemoglobin A1c   Partially dictated using [employer], [employer]. Any errors are unintentional.  Halina Maidens,  MD [facility]  03/25/2018

## 2018-03-26 ENCOUNTER — Ambulatory Visit: Payer: Medicare Other | Admitting: Internal Medicine

## 2018-03-26 LAB — COMPREHENSIVE METABOLIC PANEL
ALT: 19 IU/L (ref 0–32)
AST: 22 IU/L (ref 0–40)
Albumin/Globulin Ratio: 1.4 (ref 1.2–2.2)
Albumin: 4.3 g/dL (ref 3.8–4.8)
Alkaline Phosphatase: 61 IU/L (ref 39–117)
BILIRUBIN TOTAL: 0.5 mg/dL (ref 0.0–1.2)
BUN/Creatinine Ratio: 18 (ref 12–28)
BUN: 16 mg/dL (ref 8–27)
CHLORIDE: 97 mmol/L (ref 96–106)
CO2: 25 mmol/L (ref 20–29)
Calcium: 10.1 mg/dL (ref 8.7–10.3)
Creatinine, Ser: 0.91 mg/dL (ref 0.57–1.00)
GFR calc Af Amer: 74 mL/min/{1.73_m2} (ref >59)
GFR calc non Af Amer: 64 mL/min/{1.73_m2} (ref >59)
GLUCOSE: 111 mg/dL — AB (ref 65–99)
Globulin, Total: 3 g/dL (ref 1.5–4.5)
Potassium: 4.5 mmol/L (ref 3.5–5.2)
Sodium: 139 mmol/L (ref 134–144)
TOTAL PROTEIN: 7.3 g/dL (ref 6.0–8.5)

## 2018-03-26 LAB — CBC WITH DIFFERENTIAL/PLATELET
BASOS ABS: 0 10*3/uL (ref 0.0–0.2)
Basos: 1 %
EOS (ABSOLUTE): 0.2 10*3/uL (ref 0.0–0.4)
Eos: 5 %
Hematocrit: 44.9 % (ref 34.0–46.6)
Hemoglobin: 14.8 g/dL (ref 11.1–15.9)
IMMATURE GRANS (ABS): 0 10*3/uL (ref 0.0–0.1)
Immature Granulocytes: 0 %
LYMPHS: 29 %
Lymphocytes Absolute: 1.3 10*3/uL (ref 0.7–3.1)
MCH: 27.9 pg (ref 26.6–33.0)
MCHC: 33 g/dL (ref 31.5–35.7)
MCV: 85 fL (ref 79–97)
Monocytes Absolute: 0.4 10*3/uL (ref 0.1–0.9)
Monocytes: 9 %
NEUTROS ABS: 2.5 10*3/uL (ref 1.4–7.0)
Neutrophils: 56 %
PLATELETS: 261 10*3/uL (ref 150–450)
RBC: 5.31 x10E6/uL — AB (ref 3.77–5.28)
RDW: 13.5 % (ref 11.7–15.4)
WBC: 4.5 10*3/uL (ref 3.4–10.8)

## 2018-03-26 LAB — TSH: TSH: 1.26 u[IU]/mL (ref 0.450–4.500)

## 2018-03-26 LAB — HEMOGLOBIN A1C
Est. average glucose Bld gHb Est-mCnc: 154 mg/dL
HEMOGLOBIN A1C: 7 % — AB (ref 4.8–5.6)

## 2018-03-26 LAB — MAGNESIUM: MAGNESIUM: 1.6 mg/dL (ref 1.6–2.3)

## 2018-03-27 ENCOUNTER — Encounter: Payer: Self-pay | Admitting: Internal Medicine

## 2018-03-27 NOTE — Telephone Encounter (Signed)
Please advise pt message about medication.

## 2018-03-27 NOTE — Progress Notes (Signed)
Patient reviewed on my chart and sent over a message regarding labs.

## 2018-04-02 ENCOUNTER — Other Ambulatory Visit: Payer: Self-pay | Admitting: Internal Medicine

## 2018-04-06 DIAGNOSIS — E119 Type 2 diabetes mellitus without complications: Secondary | ICD-10-CM | POA: Diagnosis not present

## 2018-04-06 DIAGNOSIS — Z9884 Bariatric surgery status: Secondary | ICD-10-CM | POA: Diagnosis not present

## 2018-04-06 DIAGNOSIS — Z886 Allergy status to analgesic agent status: Secondary | ICD-10-CM | POA: Diagnosis not present

## 2018-04-06 DIAGNOSIS — Z823 Family history of stroke: Secondary | ICD-10-CM | POA: Diagnosis not present

## 2018-04-06 DIAGNOSIS — I340 Nonrheumatic mitral (valve) insufficiency: Secondary | ICD-10-CM | POA: Diagnosis not present

## 2018-04-06 DIAGNOSIS — R42 Dizziness and giddiness: Secondary | ICD-10-CM | POA: Diagnosis not present

## 2018-04-06 DIAGNOSIS — Z880 Allergy status to penicillin: Secondary | ICD-10-CM | POA: Diagnosis not present

## 2018-04-06 DIAGNOSIS — Z7984 Long term (current) use of oral hypoglycemic drugs: Secondary | ICD-10-CM | POA: Diagnosis not present

## 2018-04-06 DIAGNOSIS — Z791 Long term (current) use of non-steroidal anti-inflammatories (NSAID): Secondary | ICD-10-CM | POA: Diagnosis not present

## 2018-04-06 DIAGNOSIS — I493 Ventricular premature depolarization: Secondary | ICD-10-CM | POA: Diagnosis not present

## 2018-04-06 DIAGNOSIS — Z79899 Other long term (current) drug therapy: Secondary | ICD-10-CM | POA: Diagnosis not present

## 2018-04-06 DIAGNOSIS — Z885 Allergy status to narcotic agent status: Secondary | ICD-10-CM | POA: Diagnosis not present

## 2018-04-06 DIAGNOSIS — R008 Other abnormalities of heart beat: Secondary | ICD-10-CM | POA: Diagnosis not present

## 2018-04-06 DIAGNOSIS — I10 Essential (primary) hypertension: Secondary | ICD-10-CM | POA: Diagnosis not present

## 2018-04-06 DIAGNOSIS — M1990 Unspecified osteoarthritis, unspecified site: Secondary | ICD-10-CM | POA: Diagnosis not present

## 2018-04-06 DIAGNOSIS — R001 Bradycardia, unspecified: Secondary | ICD-10-CM | POA: Diagnosis not present

## 2018-04-07 DIAGNOSIS — R42 Dizziness and giddiness: Secondary | ICD-10-CM | POA: Insufficient documentation

## 2018-04-07 MED ORDER — MECLIZINE HCL 25 MG PO TABS
25.00 | ORAL_TABLET | ORAL | Status: DC
Start: ? — End: 2018-04-07

## 2018-04-07 MED ORDER — POLYETHYLENE GLYCOL 3350 17 G PO PACK
17.00 | PACK | ORAL | Status: DC
Start: ? — End: 2018-04-07

## 2018-04-07 MED ORDER — MELATONIN 3 MG PO TABS
3.00 | ORAL_TABLET | ORAL | Status: DC
Start: ? — End: 2018-04-07

## 2018-04-07 MED ORDER — CELECOXIB 200 MG PO CAPS
200.00 | ORAL_CAPSULE | ORAL | Status: DC
Start: 2018-04-08 — End: 2018-04-07

## 2018-04-07 MED ORDER — INSULIN LISPRO 100 UNIT/ML ~~LOC~~ SOLN
.00 | SUBCUTANEOUS | Status: DC
Start: 2018-04-07 — End: 2018-04-07

## 2018-04-07 MED ORDER — LOSARTAN POTASSIUM 100 MG PO TABS
100.00 | ORAL_TABLET | ORAL | Status: DC
Start: 2018-04-08 — End: 2018-04-07

## 2018-04-07 MED ORDER — ENOXAPARIN SODIUM 30 MG/0.3ML ~~LOC~~ SOLN
30.00 | SUBCUTANEOUS | Status: DC
Start: 2018-04-07 — End: 2018-04-07

## 2018-04-07 MED ORDER — ONDANSETRON 4 MG PO TBDP
4.00 | ORAL_TABLET | ORAL | Status: DC
Start: ? — End: 2018-04-07

## 2018-04-07 MED ORDER — ACETAMINOPHEN 325 MG PO TABS
650.00 | ORAL_TABLET | ORAL | Status: DC
Start: ? — End: 2018-04-07

## 2018-04-07 MED ORDER — DEXTROSE 50 % IV SOLN
12.50 | INTRAVENOUS | Status: DC
Start: ? — End: 2018-04-07

## 2018-04-08 ENCOUNTER — Telehealth: Payer: Self-pay

## 2018-04-08 NOTE — Telephone Encounter (Signed)
Transition Care Management Follow-up Telephone Call  Date of discharge and from where: 04/07/18 [facility]  How have you been since you were released from the hospital? Pt states she is still feeling dizzy or "swimmy headed" she has been resting most of the day but doing better  Any questions or concerns? No   Items Reviewed:  Did the pt receive and understand the discharge instructions provided? Yes   Medications obtained and verified? Yes   Any new allergies since your discharge? No   Dietary orders reviewed? Yes  Do you have support at home? Yes   Functional Questionnaire: (I = Independent and D = Dependent) ADLs: I  Bathing/Dressing- I  Meal Prep- I  Eating- I  Maintaining continence- I  Transferring/Ambulation- I  Managing Meds- I  Follow up appointments reviewed:   PCP Hospital f/u appt confirmed? Yes  Scheduled to see Dr. Army Melia on 04/17/18 @ 3:20.  Are transportation arrangements needed? No   If their condition worsens, is the pt aware to call PCP or go to the Emergency Dept.? Yes  Was the patient provided with contact information for the PCP's office or ED? Yes  Was to pt encouraged to call back with questions or concerns? Yes

## 2018-04-17 ENCOUNTER — Ambulatory Visit (INDEPENDENT_AMBULATORY_CARE_PROVIDER_SITE_OTHER): Payer: Medicare Other | Admitting: Internal Medicine

## 2018-04-17 ENCOUNTER — Other Ambulatory Visit: Payer: Self-pay

## 2018-04-17 ENCOUNTER — Encounter: Payer: Self-pay | Admitting: Internal Medicine

## 2018-04-17 VITALS — BP 122/62 | HR 88 | Ht 64.0 in | Wt 307.0 lb

## 2018-04-17 DIAGNOSIS — R42 Dizziness and giddiness: Secondary | ICD-10-CM

## 2018-04-17 DIAGNOSIS — I493 Ventricular premature depolarization: Secondary | ICD-10-CM | POA: Diagnosis not present

## 2018-04-17 DIAGNOSIS — R001 Bradycardia, unspecified: Secondary | ICD-10-CM | POA: Diagnosis not present

## 2018-04-17 MED ORDER — MECLIZINE HCL 12.5 MG PO TABS: 6.2500 mg | ORAL_TABLET | Freq: Four times a day (QID) | ORAL | 0 refills | Status: DC

## 2018-04-17 NOTE — Progress Notes (Signed)
Date:  04/17/2018   Name:  Megan Shields   DOB:  Jul 13, 1945   MRN:  [id number]   Chief Complaint: Hospitalization Follow-up (Vertigo. Still not feeling good. Dizzy and naseated. ) Hospital follow up visit.  Admitted to [facility] 04/06/18 to 04/07/18 for dizziness.  Labs and imaging were all normal.  She was discharged on same home medications plus medications for symptom relief of vertigo. Her vertigo remains severe.  She has only been taking meclizine about twice a day, more than that makes her too sleepy.  She still has moderate nausea but no more vomiting.  She was referred to PT but wants to go to someone at [facility]. She was also seen last visit for bradycardia and frequent sx PVCs.  Her appointment with cardiology is not until April.  She wants to go to a Tomma Rakers at [place].  After discussion, she probably still needs cardiac stress testing.  DC diagnoses: Dizziness (Primary Dx);  Ventricular bigeminy seen on cardiac monitor;  Excessive daytime sleepiness;  Vertigo  EKG: SINUS RHYTHM WITH FREQUENT PREMATURE VENTRICULAR BEATS OTHERWISE NORMAL ECG NO PREVIOUS ECGS AVAILABLE  ECHO:  Normal left ventricular systolic function, ejection fraction > 55%  Dilated left atrium - moderate  Normal right ventricular systolic function  Technically difficult study due to chest wall/lung interference  Ultrasound enhancing agent utilized to improve endocardial border  Definition  MRI Brain: FINDINGS: There is no focal parenchymal signal abnormality. There is no midline shift or mass lesion. There is no evidence of intracranial hemorrhage or acute infarct. There are no extra-axial fluid collections present. No diffusion weighted signal abnormality is identified.  There is no abnormal enhancement. DC Medications (new): ondansetron (ZOFRAN-ODT) 4 MG disintegrating tablet  Take 1 tablet (4 mg total) by mouth every eight (8) hours as needed for up to 10 days. 30 tablet  0 04/07/2018  04/17/2018  meclizine (ANTIVERT) 25 mg tablet  Take 1 tablet (25 mg total) by mouth Three (3) times a day as needed (vertigo) for up to 30 doses. 30 tablet  0 04/07/2018     Dizziness  This is a new problem. The current episode started 1 to 4 weeks ago. The problem occurs daily. The problem has been unchanged. Associated symptoms include nausea, vertigo and vomiting. Pertinent negatives include no chest pain, chills, coughing, fever, headaches or weakness. The symptoms are aggravated by bending and twisting. Treatments tried: zofran and meclizine. The treatment provided mild (but still has nausea) relief.    Review of Systems  Constitutional: Negative for chills and fever.  HENT: Negative for tinnitus.   Respiratory: Negative for cough, chest tightness, shortness of breath and wheezing.   Cardiovascular: Positive for palpitations. Negative for chest pain and leg swelling.  Gastrointestinal: Positive for nausea and vomiting.  Neurological: Positive for dizziness and vertigo. Negative for weakness, light-headedness and headaches.  Psychiatric/Behavioral: Negative for sleep disturbance. The patient is not nervous/anxious.     Patient Active Problem List   Diagnosis Date Noted  . Tubular adenoma of colon 03/10/2017  . Tinea pedis of left foot 11/22/2016  . Shoulder contusion 11/22/2015  . Type 2 diabetes mellitus with diabetic nephropathy, without long-term current use of insulin ([place]) 07/06/2015  . Essential (primary) hypertension 08/01/2014  . 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 Arthritis of knee, degenerative 08/01/2014  . Low back strain 08/01/2014    Allergies  Allergen Reactions  . Pneumococcal Vaccines Hives  . Codeine     Other reaction(s): drowsiness  .  Amoxicillin Rash    Past Surgical History:  Procedure Laterality Date  . BLEPHAROPLASTY Bilateral 12/19/2017  . CARPAL TUNNEL RELEASE Bilateral 1985  . CHOLECYSTECTOMY    . COLONOSCOPY WITH PROPOFOL N/A 03/07/2017     Procedure: COLONOSCOPY WITH PROPOFOL;  Surgeon: [name], MD;  Location: [facility] ENDOSCOPY;  Service: Gastroenterology;  Laterality: N/A;  . DILATION AND CURETTAGE OF UTERUS  12/2011   benign endometrial polyp  . HALLUX VALGUS AUSTIN Right 09/02/2014   Procedure: [place];  Surgeon: Samara Deist, DPM;  Location: [facility] ORS;  Service: Podiatry;  Laterality: Right;  . HERNIA REPAIR    . LAPAROSCOPIC GASTRIC BANDING  2010   Lap Band  . lymph node removed Right   . TOTAL KNEE ARTHROPLASTY Bilateral 2003, 2004  . TOTAL KNEE ARTHROPLASTY Bilateral   . VENTRAL HERNIA REPAIR  2008    Social History   Tobacco Use  . Smoking status: Never Smoker  . Smokeless tobacco: Never Used  Substance Use Topics  . Alcohol use: No    Alcohol/week: 0.0 standard drinks  . Drug use: No     Medication list has been reviewed and updated.  Current Meds  Medication Sig  . acetaminophen (TYLENOL) 500 MG tablet Take 1,000 mg by mouth at bedtime as needed.  Marland Kitchen BIONECT 0.2 % CREA Apply topically 2 (two) times daily. to affected area  . celecoxib (CELEBREX) 200 MG capsule TAKE 1 CAPSULE DAILY  . cyclobenzaprine (FLEXERIL) 10 MG tablet Take 10 mg by mouth 3 (three) times daily as needed for muscle spasms.  Marland Kitchen glucose blood (FREESTYLE LITE) test strip Test Blood Sugar Twice Daily.  . Lancets (FREESTYLE) lancets Test Blood Sugar twice daily.  Marland Kitchen lidocaine (LIDODERM) 5 % Place onto the skin.  . metFORMIN (GLUCOPHAGE) 500 MG tablet TAKE 1 TABLET THREE TIMES A DAY AFTER MEALS  . olmesartan-hydrochlorothiazide (BENICAR HCT) 40-25 MG tablet Take 1 tablet by mouth daily.  . ondansetron (ZOFRAN-ODT) 4 MG disintegrating tablet Take 1 tablet by mouth every 8 (eight) hours as needed. For up to 10 days  . [DISCONTINUED] meclizine (ANTIVERT) 25 MG tablet Take 1 tablet by mouth 3 (three) times daily as needed. Pt taking 1/2 tab PRN due to drowsiness    PHQ 2/9 Scores 04/17/2018 03/25/2018 09/23/2017 11/22/2016  PHQ -  2 Score 0 0 0 0    Physical Exam Vitals signs and nursing note reviewed.  Constitutional:      General: She is not in acute distress.    Appearance: She is well-developed.  HENT:     Head: Normocephalic and atraumatic.     Right Ear: Tympanic membrane and ear canal normal.     Left Ear: Tympanic membrane and ear canal normal.  Eyes:     Pupils: Pupils are equal, round, and reactive to light.  Neck:     Musculoskeletal: Normal range of motion and neck supple.  Cardiovascular:     Rate and Rhythm: Normal rate and regular rhythm.  Pulmonary:     Effort: Pulmonary effort is normal. No respiratory distress.  Musculoskeletal: Normal range of motion.  Skin:    General: Skin is warm and dry.     Findings: No rash.  Neurological:     Mental Status: She is alert and oriented to person, place, and time.  Psychiatric:        Behavior: Behavior normal.        Thought Content: Thought content normal.     BP 122/62  Pulse 88   Ht 5\' 4"  (1.626 m)   Wt (!) 307 lb (139.3 kg)   SpO2 96%   BMI 52.70 kg/m   Assessment and Plan: 1. Vertigo Take meclizine 6.25 mg qid - meclizine (ANTIVERT) 12.5 MG tablet; Take 0.5 tablets (6.25 mg total) by mouth QID.  Dispense: 60 tablet; Refill: 0 - Ambulatory referral to Physical Therapy  2. Frequent unifocal PVCs Had normal ECHO and MRI but probably needs cardiac stress testing - Ambulatory referral to Cardiology  3. Bradycardia - Ambulatory referral to Cardiology   Partially dictated using [place]. Any errors are unintentional.  Halina Maidens, MD [facility]  04/17/2018

## 2018-04-20 ENCOUNTER — Ambulatory Visit: Payer: Medicare Other

## 2018-05-05 ENCOUNTER — Other Ambulatory Visit: Payer: Self-pay

## 2018-05-05 ENCOUNTER — Ambulatory Visit: Payer: Medicare Other | Attending: Internal Medicine | Admitting: Physical Therapy

## 2018-05-05 ENCOUNTER — Encounter: Payer: Self-pay | Admitting: Physical Therapy

## 2018-05-05 DIAGNOSIS — H8111 Benign paroxysmal vertigo, right ear: Secondary | ICD-10-CM | POA: Diagnosis not present

## 2018-05-05 DIAGNOSIS — R42 Dizziness and giddiness: Secondary | ICD-10-CM | POA: Diagnosis not present

## 2018-05-05 NOTE — Therapy (Addendum)
[place] MAIN [facility] SERVICES [address] [place], [place], [address] Phone: [phone]   Fax:  [phone]  Physical Therapy Evaluation  Patient Details  Name: Megan Shields MRN: [id number] Date of Birth: 1945-08-09 No data recorded  Encounter Date: 05/05/2018  PT End of Session - 05/08/18 1539    Visit Number  1    Number of Visits  9    Date for PT Re-Evaluation  06/30/18    PT Start Time  1106    Equipment Utilized During Treatment  Gait belt    Activity Tolerance  Other (comment)   increased nausea and dizziness with Right Dix-Hallpike testing and treatment   Behavior During Therapy  [facility] for tasks assessed/performed       Past Medical History:  Diagnosis Date  . Arthritis   . Diabetes mellitus without complication ([place])   . Hypertension   . Obesity   . PONV (postoperative nausea and vomiting)     Past Surgical History:  Procedure Laterality Date  . BLEPHAROPLASTY Bilateral 12/19/2017  . CARPAL TUNNEL RELEASE Bilateral 1985  . CHOLECYSTECTOMY    . COLONOSCOPY WITH PROPOFOL N/A 03/07/2017   Procedure: COLONOSCOPY WITH PROPOFOL;  Surgeon: [name], MD;  Location: [facility] ENDOSCOPY;  Service: Gastroenterology;  Laterality: N/A;  . DILATION AND CURETTAGE OF UTERUS  12/2011   benign endometrial polyp  . HALLUX VALGUS AUSTIN Right 09/02/2014   Procedure: [place];  Surgeon: Samara Deist, DPM;  Location: [facility] ORS;  Service: Podiatry;  Laterality: Right;  . HERNIA REPAIR    . LAPAROSCOPIC GASTRIC BANDING  2010   Lap Band  . lymph node removed Right   . TOTAL KNEE ARTHROPLASTY Bilateral 2003, 2004  . TOTAL KNEE ARTHROPLASTY Bilateral   . VENTRAL HERNIA REPAIR  2008    Pain: patient denies  There were no vitals filed for this visit.   [facility] PT Assessment - 05/08/18 1615      Assessment   Medical Diagnosis  vertigo    Referring Provider (PT)  Dr. Halina Maidens      Precautions   Precautions  Fall      Restrictions   Weight Bearing Restrictions  No      Balance Screen   Has the patient fallen in the past 6 months  No    Has the patient had a decrease in activity level because of a fear of falling?   No    Is the patient reluctant to leave their home because of a fear of falling?   No      Home Film/video editor residence    Living Arrangements  Spouse/significant other    Available Help at Discharge  Family;Friend(s)    Type of [place] to enter    Entrance Stairs-Number of Steps  2    Entrance Stairs-Rails  Right    Home Layout  One level    [place] - single point;Walker - 2 wheels      Prior Function   Level of Independence  Independent    Vocation  Other (comment)   housewife     Cognition   Overall Cognitive Status  Within Functional Limits for tasks assessed      Standardized Balance Assessment   Standardized Balance Assessment  Dynamic Gait Index      Dynamic Gait Index   Level Surface  Normal  11 seconds   Change in Gait Speed  Normal    Gait with Horizontal Head Turns  Normal    Gait with Vertical Head Turns  Mild Impairment    Gait and Pivot Turn  Normal    Step Over Obstacle  Moderate Impairment    Step Around Obstacles  Normal    Steps  Moderate Impairment    Total Score  19        VESTIBULAR AND BALANCE EVALUATION   HISTORY:  Subjective history of current problem: Patient reports one month ago patient woke up and had severe dizziness with nausea and vomiting. Patient went to the [facility] ED due to dizziness and she was admitted for care form 2/10-2/12/2018. Patient reports she had a cardiac ultrasound, brain MRI and heart MRI and patient findings were negative. Patient reports she was found to have a low pulse by her primary care physician prior to this onset of vertigo. She then saw a cardiologist. Per patient report, her cardiologist said the heart was working well. Patient states she has a  follow-up appointment next month and they are going to check for any blockages. Patient reports vertigo, unsteadiness, veering to the left. Patient reports her head and ears feel stopped up at times and states her head feels pressure and then it starts spinning. Patient describes that they did the Dix-Hallpike test and attempted the canalith repositioning maneuver at [facility]. Patient reports this caused intense vertigo and nausea and vomiting. Patient reports that if she sits in a chair and leans her head back it brings on the dizziness. Patient states if she lies on her left side and stays still she is okay but if she moves or rolls it brings on the vertigo. Patient states she still is getting mildly nauseous at times after a spell of vertigo but no longer has the vomiting. Patient reports that she has slowed down her movements since onset of vertigo one month ago. Patient reports she is getting episodes of vertigo several times a day. Patient reports sitting up in the morning, leaning head back and when turning left and right bring on her symptoms. Patient reports staying still and the Meclizine help ease her symptoms. Patient reports vertigo lasts a few seconds.  Description of dizziness: vertigo, unsteadiness, aural fullness Frequency: Patient reports she is getting several times a day. States she gets it when she sits up in the morning, during the day when sitting in chair and leaning head back and when turning left and right.  Duration: lasts a few seconds  Symptom nature: (motion provoked, positional, spontaneous, constant, variable, intermittent)   Provocative Factors: lying down in bed, rolling over in bed to the right side, bending over, moving her head Easing Factors: staying still; meclizine helps as well  Progression of symptoms: better History of similar episodes: no  Falls (yes/no): denies Number of falls in past 6 months: none  Prior Functional Level:   Auditory complaints (tinnitus,  pain, drainage): denies Vision (last eye exam, diplopia, recent changes): patient wears glasses; no recent changes.  Current Symptoms: (dysarthria, dysphagia, drop attacks, bowel and bladder changes, recent weight loss/gain)   Review of systems negative for red flags.     EXAMINATION  POSTURE: rounded shoulders      COORDINATION: Finger to Nose:  Normal Past Pointing:   Normal  MUSCULOSKELETAL SCREEN: Cervical Spine ROM: Cervical AROM WFL flexion, extension, right and left rotation without pain  Functional Mobility: Independent with transfers, sit to sup and rolling  Patient required mod A with sidelying to sitting  Gait: Patient arrives ambulating without AD. Patient ambulates with decreased cadence. Patient ascends/descends 4 stairs with B rails with step to pattern with S.  Scanning of visual environment with gait is: fair  Balance: Patient is challenged by ambulation with head turns, single leg stance   OCULOMOTOR / VESTIBULAR TESTING:  Oculomotor Exam- Room Light  Normal Abnormal Comments  Ocular Alignment N    Ocular ROM N    Spontaneous Nystagmus N    Gaze evoked Nystagmus N    Smooth Pursuit N    Saccades N  Very mildly hypometric; age appropriate  VOR   Deferred secondary to positive right Dix-Hallpike test  VOR Cancellation  Abn Target blurring at slow speeds; pt states "that made my head feel a little funny"  Left Head Impulse   Deferred secondary to positive right Dix-Hallpike test  Right Head Impulse   Deferred secondary to positive right Dix-Hallpike test  Head Shaking Nystagmus   Deferred secondary to positive right Dix-Hallpike test  Static Acuity   Deferred secondary to positive right Dix-Hallpike test  Dynamic Acuity   Deferred secondary to positive right Dix-Hallpike test    BPPV TESTS:  Symptoms Duration Intensity Nystagmus  Left Dix-Hallpike None N/A N/A None observed  Right Dix-Hallpike Vertigo; nausea during CRT Less than one minute 5/10  Upbeating, right torsional lasted about 10 seconds and then after an additional 5-10 seconds had a second episode of nystagmus and vertigo lasting about 5 seconds.     FUNCTIONAL OUTCOME MEASURES:  Results Comments  DHI    36 /100 low perception of handicap  ABC Scale     60  % Falls risk; in need of intervention  DGI     19   /24 Falls risk; in need of intervention  10 meter Walking Speed 0.9  M/sec average as compared to age and gender normative values     Canalith Repositioning Manuever: On mat table, performed right Dix-Hallpike testing and it was positive with right upbeating torsional nystagmus of short duration with few second latency and noted second burst of nystagmus about 5-10 seconds after first burst subsided.  Performed right canalith repositioning maneuver (CRT) with one minute holds for each position.  Repeated right CRT for a total of 2 maneuvers with retesting between maneuvers. Patient with no nystagmus observed with second CRT. Patient reported 5/10 vertigo with first and no vertigo with second maneuver.   Patient will benefit from skilled therapeutic intervention in order to improve the following deficits and impairments:  Dizziness, Decreased balance, Difficulty walking, Decreased activity tolerance, Decreased mobility  Visit Diagnosis: BPPV (benign paroxysmal positional vertigo), right  Dizziness and giddiness     PT Short Term Goals - 05/08/18 1545      PT SHORT TERM GOAL #1   Title  Patient will report 50% or greater improvement in her symptoms of dizziness and imbalance within 4 weeks in order to help decrease patient's falls risk.     Time  4    Period  Weeks    Status  New    Target Date  06/02/18       PT Long Term Goals - 05/08/18 1546      PT LONG TERM GOAL #1   Title  Patient will report 80% or more improvement in patient's subjective symptoms of dizziness and imbalance in order to have patient be able to return to her prior activities.  Time  8    Period  Weeks    Status  New    Target Date  07/03/18      PT LONG TERM GOAL #2   Title  Patient will score 80% or greater on the ABC scale in order to demonstrate improved balance in patient's everyday life and to decrease falls risk.     Baseline  scored 420/700 (secondary to patient left items blank on the form) which is 60%;     Time  8    Period  Weeks    Status  New    Target Date  07/03/18      PT LONG TERM GOAL #3   Title  Patient will score 21/24 or greater on the DGI in order to demonstrate improved balance.    Baseline  scored 19/24 on 05/05/18;     Time  8    Period  Weeks    Status  New    Target Date  07/03/18      Plan - 05/08/18 1540    Clinical Impression Statement  Patient presents with nystagmus and vertigo most consistent with right horizontal canal BPPV that responded well to canalith repositioning manuever. Patient reported 5/10 with first manuever and 0/10 with second manuever. Patient presents with listed functional deficits and would benefit from continued vestibular therapy to further address goals as set on plan of care     Personal Factors and Comorbidities  Age;Comorbidity 2    Comorbidities  DM, HTN, arthritis    Examination-Activity Limitations  Bend    Examination-Participation Restrictions  Driving    Stability/Clinical Decision Making  Evolving/Moderate complexity    Clinical Decision Making  Moderate    Rehab Potential  Good    PT Frequency  1x / week    PT Duration  8 weeks    PT Treatment/Interventions  Vestibular;Canalith Repostioning;Gait training;Stair training;Therapeutic exercise;Therapeutic activities;Neuromuscular re-education;Patient/family education    PT Next Visit Plan  repeat dix-hallpike testing and CRT if indicated    Consulted and Agree with Plan of Care  Patient        Problem List Patient Active Problem List   Diagnosis Date Noted  . Tubular adenoma of colon 03/10/2017  . Tinea pedis of left foot 11/22/2016    . Shoulder contusion 11/22/2015  . Type 2 diabetes mellitus with diabetic nephropathy, without long-term current use of insulin ([place]) 07/06/2015  . Essential (primary) hypertension 08/01/2014  . 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 Arthritis of knee, degenerative 08/01/2014  . Low back strain 08/01/2014   Lady Deutscher PT, DPT [phone] Lady Deutscher 05/08/2018, 4:22 PM  [place] MAIN [facility] SERVICES [address] [place], [place], [address] Phone: [phone]   Fax:  [phone]  Name: Megan Shields MRN: [id number] Date of Birth: 1945/07/23

## 2018-05-11 ENCOUNTER — Other Ambulatory Visit: Payer: Self-pay

## 2018-05-11 ENCOUNTER — Encounter: Payer: Self-pay | Admitting: Physical Therapy

## 2018-05-11 ENCOUNTER — Ambulatory Visit: Payer: Medicare Other | Admitting: Physical Therapy

## 2018-05-11 DIAGNOSIS — H8111 Benign paroxysmal vertigo, right ear: Secondary | ICD-10-CM | POA: Diagnosis not present

## 2018-05-11 DIAGNOSIS — R42 Dizziness and giddiness: Secondary | ICD-10-CM | POA: Diagnosis not present

## 2018-05-11 NOTE — Therapy (Addendum)
[place] MAIN [facility] SERVICES [address] [place], [place], [address] Phone: [phone]   Fax:  [phone]  Physical Therapy Treatment  Patient Details  Name: Megan Shields MRN: [id number] Date of Birth: December 18, 1945 Referring Provider (PT): Dr. Halina Maidens   Encounter Date: 05/11/2018  PT End of Session - 05/11/18 1250    Visit Number  2    Number of Visits  9    Date for PT Re-Evaluation  06/30/18    PT Start Time  1254    PT Stop Time  1320    PT Time Calculation (min)  26 min    Equipment Utilized During Treatment  --    Activity Tolerance  Patient tolerated treatment well    Behavior During Therapy  [facility] for tasks assessed/performed       Past Medical History:  Diagnosis Date  . Arthritis   . Diabetes mellitus without complication ([place])   . Hypertension   . Obesity   . PONV (postoperative nausea and vomiting)     Past Surgical History:  Procedure Laterality Date  . BLEPHAROPLASTY Bilateral 12/19/2017  . CARPAL TUNNEL RELEASE Bilateral 1985  . CHOLECYSTECTOMY    . COLONOSCOPY WITH PROPOFOL N/A 03/07/2017   Procedure: COLONOSCOPY WITH PROPOFOL;  Surgeon: [name], MD;  Location: [facility] ENDOSCOPY;  Service: Gastroenterology;  Laterality: N/A;  . DILATION AND CURETTAGE OF UTERUS  12/2011   benign endometrial polyp  . HALLUX VALGUS [place]USTIN Right 09/02/2014   Procedure: [place];  Surgeon: Samara Deist, DPM;  Location: [facility] ORS;  Service: Podiatry;  Laterality: Right;  . HERNIA REPAIR    . LAPAROSCOPIC GASTRIC BANDING  2010   Lap Band  . lymph node removed Right   . TOTAL KNEE ARTHROPLASTY Bilateral 2003, 2004  . TOTAL KNEE ARTHROPLASTY Bilateral   . VENTRAL HERNIA REPAIR  2008    There were no vitals filed for this visit.  Subjective Assessment - 05/11/18 1250    Subjective  Patient states after the last session she had nausea for a few hours and then after the nausea went away patient states she felt  wonderful. Patient reports that when she woke up the next morning she felt bad.  Patient states she did have some vertigo on Wednesday and Thursday last week. Patient states she took Meclizine on Weds and Thursday and on Friday, she took a 1/2 dose of Meclizine due to vertigo. Patient states over the weekend she started to feel much better again and has not had any further episodes of vertigo since Friday. Patient states she feels good today as well.     Pertinent History  Patient reports one month ago patient woke up and had severe dizziness with nausea and vomiting. Patient went to the [facility] ED due to dizziness and she was admitted for care form 2/10-2/12/2018. Patient reports she had a cardiac ultrasound, brain MRI and heart MRI and patient findings were negative. Patient reports she was found to have a low pulse by her primary care physician prior to this onset of vertigo. She then saw a cardiologist.    Currently in Pain?  No/denies       Neuromuscular Re-education:  Dix-Hallpike: Performed left and right Dix-Hallpike tests as well as right and left horizontal canal testing and all were negative with patient denying vertigo and no nystagmus observed.  Self-Epley: Educated patient as to how to perform self-Epley at home lying on a bed. Patient provided with a handout  and this Probation officer discussed each picture on the handout with the patient. Patient declined to practice the self-Epley today in the clinic.  Plan is for her to practice at home with assistance of her husband and then next session to have them demonstrate to make sure that they are performing the maneuver properly.  Handout provided.    Canalith Repositioning Manuever: On mat table performed right Dix-Hallpike testing and it was negative with no nystagmus observed and patient denied vertigo. However, performed one maneuver to help ensure full clearance of particles, secondary to patient had a positive test last session and performed the  CRM twice. In addition, performed this date secondary to patient's reports of having additional episodes of vertigo last Wednesday and Thursday after undergoing the CRM on Tuesday of last week.    Patient returns to clinic reporting that she had a few episodes of vertigo last Wednesday and Thursday but states she has not had any further episodes of vertigo since last Friday. In addition, patient reports that she felt much improved over the last three days. Performed repeat Dix-Hallpike and horizontal canal testing this session and all were negative with no nystagmus observed and patient denied vertigo or dizziness. Performed one right Epley maneuver to help ensure full clearance of particles. Patient educated as to handout on how to perform self-Epley at home. Plan for patient to return to the clinic in two weeks. Plan on having patient and her husband demonstrate how they have been doing the self-Epley at home. Will plan on discharging patient at that time if goals are met.     PT Education - 05/11/18 1250    Education Details  discussed plan of care; discussed and reviewed how to perform self-epley at home with handout provided    Person(s) Educated  Patient    Methods  Explanation;Handout;Verbal cues    Comprehension  Verbalized understanding       PT Short Term Goals - 05/08/18 1545      PT SHORT TERM GOAL #1   Title  Patient will report 50% or greater improvement in her symptoms of dizziness and imbalance within 4 weeks in order to help decrease patient's falls risk.     Time  4    Period  Weeks    Status  New    Target Date  06/02/18        PT Long Term Goals - 05/08/18 1546      PT LONG TERM GOAL #1   Title  Patient will report 80% or more improvement in patient's subjective symptoms of dizziness and imbalance in order to have patient be able to return to her prior activities.     Time  8    Period  Weeks    Status  New    Target Date  07/03/18      PT LONG TERM GOAL #2    Title  Patient will score 80% or greater on the ABC scale in order to demonstrate improved balance in patient's everyday life and to decrease falls risk.     Baseline  scored 420/700 (secondary to patient left items blank on the form) which is 60%;     Time  8    Period  Weeks    Status  New    Target Date  07/03/18      PT LONG TERM GOAL #3   Title  Patient will score 21/24 or greater on the DGI in order to demonstrate improved balance.  Baseline  scored 19/24 on 05/05/18;     Time  8    Period  Weeks    Status  New    Target Date  07/03/18            Plan - 05/11/18 1250    Clinical Impression Statement  Patient returns to clinic reporting that she had a few episodes of vertigo last Wednesday and Thursday but states she has not had any further episodes of vertigo since last Friday. In addition, patient reports that she felt much improved over the last three days. Performed repeat Dix-Hallpike and horizontal canal testing this session and all were negative with no nystagmus observed and patient denied vertigo or dizziness. Performed one right Epley maneuver to help ensure full clearance of particles. Patient educated as to handout on how to perform self-Epley at home. Plan for patient to return to the clinic in two weeks. Plan on having patient and her husband demonstrate how they have been doing the self-Epley at home. Will plan on discharging patient at that time if goals are met.    Personal Factors and Comorbidities  Age;Comorbidity 2    Comorbidities  DM, HTN, arthritis    Examination-Activity Limitations  Bend    Examination-Participation Restrictions  Driving    Stability/Clinical Decision Making  Evolving/Moderate complexity    Rehab Potential  Good    PT Frequency  1x / week    PT Duration  8 weeks    PT Treatment/Interventions  Vestibular;Canalith Repostioning;Gait training;Stair training;Therapeutic exercise;Therapeutic activities;Neuromuscular re-education;Patient/family  education    PT Next Visit Plan  repeat dix-hallpike testing and CRT if indicated    Consulted and Agree with Plan of Care  Patient       Patient will benefit from skilled therapeutic intervention in order to improve the following deficits and impairments:  Dizziness, Decreased balance, Difficulty walking, Decreased activity tolerance, Decreased mobility  Visit Diagnosis: BPPV (benign paroxysmal positional vertigo), right  Dizziness and giddiness     Problem List Patient Active Problem List   Diagnosis Date Noted  . Tubular adenoma of colon 03/10/2017  . Tinea pedis of left foot 11/22/2016  . Shoulder contusion 11/22/2015  . Type 2 diabetes mellitus with diabetic nephropathy, without long-term current use of insulin ([place]) 07/06/2015  . Essential (primary) hypertension 08/01/2014  . 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 Arthritis of knee, degenerative 08/01/2014  . Low back strain 08/01/2014   Lady Deutscher PT, DPT [phone] Lady Deutscher 05/11/2018, 3:00 PM  [place] MAIN [facility] SERVICES [address] [place], [place], [address] Phone: [phone]   Fax:  [phone]  Name: Megan Shields MRN: [id number] Date of Birth: [birth year]

## 2018-05-13 ENCOUNTER — Ambulatory Visit: Payer: Medicare Other

## 2018-05-16 ENCOUNTER — Other Ambulatory Visit: Payer: Self-pay | Admitting: Internal Medicine

## 2018-05-19 ENCOUNTER — Encounter: Payer: Medicare Other | Admitting: Physical Therapy

## 2018-05-26 ENCOUNTER — Encounter: Payer: Medicare Other | Admitting: Physical Therapy

## 2018-06-02 ENCOUNTER — Encounter: Payer: Medicare Other | Admitting: Physical Therapy

## 2018-06-09 ENCOUNTER — Encounter: Payer: Medicare Other | Admitting: Physical Therapy

## 2018-06-16 ENCOUNTER — Encounter: Payer: Medicare Other | Admitting: Physical Therapy

## 2018-06-23 ENCOUNTER — Encounter: Payer: Medicare Other | Admitting: Physical Therapy

## 2018-06-25 ENCOUNTER — Ambulatory Visit: Payer: Medicare Other | Admitting: Internal Medicine

## 2018-07-05 ENCOUNTER — Other Ambulatory Visit: Payer: Self-pay | Admitting: Internal Medicine

## 2018-07-31 DIAGNOSIS — E119 Type 2 diabetes mellitus without complications: Secondary | ICD-10-CM | POA: Diagnosis not present

## 2018-08-04 DIAGNOSIS — L578 Other skin changes due to chronic exposure to nonionizing radiation: Secondary | ICD-10-CM | POA: Diagnosis not present

## 2018-08-04 DIAGNOSIS — L718 Other rosacea: Secondary | ICD-10-CM | POA: Diagnosis not present

## 2018-08-04 DIAGNOSIS — L720 Epidermal cyst: Secondary | ICD-10-CM | POA: Diagnosis not present

## 2018-08-04 DIAGNOSIS — L570 Actinic keratosis: Secondary | ICD-10-CM | POA: Diagnosis not present

## 2018-09-25 ENCOUNTER — Ambulatory Visit: Payer: Medicare Other | Admitting: Internal Medicine

## 2018-10-06 ENCOUNTER — Ambulatory Visit (INDEPENDENT_AMBULATORY_CARE_PROVIDER_SITE_OTHER): Payer: Medicare Other | Admitting: Internal Medicine

## 2018-10-06 ENCOUNTER — Other Ambulatory Visit: Payer: Self-pay

## 2018-10-06 ENCOUNTER — Encounter: Payer: Self-pay | Admitting: Internal Medicine

## 2018-10-06 VITALS — BP 136/82 | HR 70 | Ht 64.0 in | Wt 304.0 lb

## 2018-10-06 DIAGNOSIS — Z0000 Encounter for general adult medical examination without abnormal findings: Secondary | ICD-10-CM

## 2018-10-06 DIAGNOSIS — Z9189 Other specified personal risk factors, not elsewhere classified: Secondary | ICD-10-CM | POA: Diagnosis not present

## 2018-10-06 DIAGNOSIS — I10 Essential (primary) hypertension: Secondary | ICD-10-CM

## 2018-10-06 DIAGNOSIS — E118 Type 2 diabetes mellitus with unspecified complications: Secondary | ICD-10-CM | POA: Diagnosis not present

## 2018-10-06 DIAGNOSIS — Z1231 Encounter for screening mammogram for malignant neoplasm of breast: Secondary | ICD-10-CM | POA: Diagnosis not present

## 2018-10-06 LAB — POCT URINALYSIS DIPSTICK
Bilirubin, UA: NEGATIVE
Blood, UA: NEGATIVE
Glucose, UA: NEGATIVE
Ketones, UA: NEGATIVE
Leukocytes, UA: NEGATIVE
Nitrite, UA: POSITIVE
Protein, UA: NEGATIVE
Spec Grav, UA: 1.015 (ref 1.010–1.025)
Urobilinogen, UA: 0.2 E.U./dL
pH, UA: 6 (ref 5.0–8.0)

## 2018-10-06 NOTE — Patient Instructions (Signed)
Exercise Information for Aging Adults Staying physically active is important as you age. The four types of exercises that are best for older adults are endurance, strength, balance, and flexibility. Contact your health care provider before you start any exercise routine. Ask your health care provider what activities are safe for you. What are the risks? Risks associated with exercising include:  Overdoing it. This may lead to sore muscles or fatigue.  Falls.  Injuries.  Dehydration. How to do these exercises Endurance exercises Endurance (aerobic) exercises raise your breathing rate and heart rate. Increasing your endurance helps you to do everyday tasks and stay healthy. By improving the health of your body system that includes your heart, lungs, and blood vessels (circulatory system), you may also delay or prevent diseases such as heart disease, diabetes, and bone loss (osteoporosis). Types of endurance exercises include:  Sports.  Indoor activities, such as using gym equipment, doing water aerobics, or dancing.  Outdoor activities, such as biking or jogging.  Tasks around the house, such as gardening, yard work, and heavy household chores like cleaning.  Walking, such as hiking or walking around your neighborhood. When doing endurance exercises, make sure you:  Are aware of your surroundings.  Use safety equipment as directed.  Dress in layers when exercising outdoors.  Drink plenty of water to stay well hydrated. Build up endurance slowly. Start with 10 minutes at a time, and gradually build up to doing 30 minutes at a time. Unless your health care provider gave you different instructions, aim to exercise for a total of 150 minutes a week. Spread out that time so you are working on endurance on 3 or more days a week. Strength exercises Lifting, pulling, or pushing weights helps to strengthen muscles. Having stronger muscles makes it easier to do everyday activities, such as  getting up from a chair, climbing stairs, carrying groceries, and playing with grandchildren. Strength exercises include arm and leg exercises that may be done:  With weights.  Without weights (using your own body weight).  With a resistance band. When doing strength exercises:  Move smoothly and steadily. Do not suddenly thrust or jerk the weights, the resistance band, or your body.  Start with no weights or with light weights, and gradually add more weight over time. Eventually, aim to use weights that are hard or very hard for you to lift. This means that you are able to do 8 repetitions with the weight, and the last few repetitions are very challenging.  Lift or push weights into position for 3 seconds, hold the position for 1 second, and then take 3 seconds to return to your starting position.  Breathe out (exhale) during difficult movements, like lifting or pushing weights. Breathe in (inhale) to relax your muscles before the next repetition.  Consider alternating arms or legs, especially when you first start strength exercises.  Expect some slight muscle soreness after each session. Do strength exercises on 2 or more days a week, for 30 minutes at a time. Avoid exercising the same muscle groups two days in a row. For example, if you work on your leg muscles one day, work on your arm muscles the next day. When you can do two sets of 10-15 repetitions with a certain weight, increase the amount of weight. Balance Balance exercises can help to prevent falls. Balance exercises include:  Standing on one foot.  Heel-to-toe walk.  Balance walk.  Tai chi. Make sure you have something sturdy to hold onto while doing  balance exercises, such as a sturdy chair. As your balance improves, challenge yourself by holding onto the chair with one hand instead of two, and then with no hands. Trying exercises with your eyes closed also challenges your balance, but be sure to have a sturdy surface  (like a countertop) close by in case you need it. Do balance exercises as often as you want, or as often as directed by your health care provider. Strength exercises for the lower body also help to improve balance. Flexibility  Flexibility exercises improve how far you can bend, straighten, move, or rotate parts of your body (range of motion). These exercises also help you to do everyday activities such as getting dressed or reaching for objects. Flexibility exercises include stretching different parts of the body, and they may be done in a standing or seated position or on the floor. When stretching, make sure you:  Keep a slight bend in your arms and legs. Avoid completely straightening ("locking") your joints.  Do not stretch so far that you feel pain. You should feel a mild stretching feeling. You may try stretching farther as you become more flexible over time.  Relax and breathe between stretches.  Hold onto something sturdy for balance as needed. Hold each stretch for 10-30 seconds. Repeat each stretch 3-5 times. General safety tips  Exercise in well-lit areas.  Do not hold your breath during exercises or stretches.  Warm up before exercising, and cool down after exercising. This can help prevent injury.  Drink plenty of water during exercise or any activity that makes you sweat.  Use smooth, steady movements. Do not use sudden, jerking movements, especially when lifting weights or doing flexibility exercises.  If you are not sure if an exercise is safe for you, or you are not sure how to do an exercise, talk with your health care provider. This is especially important if you have had surgery on muscles, bones, or joints (orthopedic surgery). Where to find more information You can find more information about exercise for older adults from:  Your local health department, fitness center, or community center. These facilities may have programs for aging adults.  [employer] on Aging: http://kim-miller.com/  National Council on Aging: www.ncoa.org Summary  Staying physically active is important as you age.  Make sure to contact your health care provider before you start any exercise routine. Ask your health care provider what activities are safe for you.  Doing endurance, strength, balance, and flexibility exercises can help to delay or prevent certain diseases, such as heart disease, diabetes, and bone loss (osteoporosis). This information is not intended to replace advice given to you by your health care provider. Make sure you discuss any questions you have with your health care provider. Document Released: 07/03/2016 Document Revised: 12/04/2017 Document Reviewed: 07/03/2016 Elsevier Patient Education  2020 [employer].

## 2018-10-06 NOTE — Progress Notes (Signed)
Patient: Megan Shields, Female    DOB: 1945-08-28, 73 y.o.   MRN: [id number] Visit Date: 10/06/2018  Today's Provider: Halina Maidens, MD   Chief Complaint  Patient presents with   Annual Exam    Breast Exam.   Subjective:    Annual wellness visit Megan Shields is a 73 y.o. female who presents today for her Subsequent Annual Wellness Visit. She feels well. She reports exercising very little. She reports she is sleeping fairly well. She denies breast problems.  She wants to wait a bit to get her mammogram due to Covid.  Mammogram  04/2017 Colonoscopy  02/2017 DEXA - none Immunizations - up to date ----------------------------------------------------------- Hypertension This is a chronic problem. The problem has been waxing and waning (at home 130/80; never up to 150 or higher) since onset. The problem is resistant. Pertinent negatives include no chest pain, headaches, palpitations or shortness of breath. There are no associated agents to hypertension. Risk factors for coronary artery disease include obesity, diabetes mellitus and dyslipidemia.  Diabetes She presents for her follow-up diabetic visit. She has type 2 diabetes mellitus. Her disease course has been stable. Pertinent negatives for hypoglycemia include no dizziness, headaches, nervousness/anxiousness or tremors. Pertinent negatives for diabetes include no chest pain, no fatigue, no polydipsia and no polyuria. There are no hypoglycemic complications. Risk factors for coronary artery disease include sedentary lifestyle and obesity. Current diabetic treatment includes oral agent (monotherapy). She is compliant with treatment all of the time. Her weight is stable. She is following a generally healthy diet. She monitors blood glucose at home 1-2 x per day. Her breakfast blood glucose is taken between 6-7 am. Her breakfast blood glucose range is generally 130-140 mg/dl. An ACE inhibitor/angiotensin II receptor blocker is being taken.  Eye exam is not current.   Lab Results  Component Value Date   HGBA1C 7.0 (H) 03/25/2018   Lab Results  Component Value Date   CREATININE 0.91 03/25/2018   BUN 16 03/25/2018   NA 139 03/25/2018   K 4.5 03/25/2018   CL 97 03/25/2018   CO2 25 03/25/2018   Lab Results  Component Value Date   CHOL 187 09/23/2017   HDL 69 09/23/2017   LDLCALC 99 09/23/2017   TRIG 97 09/23/2017   CHOLHDL 2.7 09/23/2017     Review of Systems  Constitutional: Negative for chills, fatigue and fever.  HENT: Negative for congestion, hearing loss, tinnitus, trouble swallowing and voice change.   Eyes: Negative for visual disturbance.  Respiratory: Negative for cough, chest tightness, shortness of breath and wheezing.   Cardiovascular: Negative for chest pain, palpitations and leg swelling.  Gastrointestinal: Negative for abdominal pain, constipation, diarrhea and vomiting.  Endocrine: Negative for polydipsia and polyuria.  Genitourinary: Negative for dysuria, frequency, genital sores, vaginal bleeding and vaginal discharge.  Musculoskeletal: Negative for arthralgias, gait problem and joint swelling.  Skin: Negative for color change and rash.  Allergic/Immunologic: Negative for environmental allergies.  Neurological: Negative for dizziness, tremors, light-headedness and headaches.  Hematological: Negative for adenopathy. Does not bruise/bleed easily.  Psychiatric/Behavioral: Negative for dysphoric mood and sleep disturbance. The patient is not nervous/anxious.     Social History   Socioeconomic History   Marital status: Married    Spouse name: Not on file   Number of children: Not on file   Years of education: Not on file   Highest education level: Not on file  Occupational History   Not on file  Social Needs   Financial  resource strain: Not on file   Food insecurity    Worry: Not on file    Inability: Not on file   Transportation needs    Medical: Not on file    Non-medical: Not  on file  Tobacco Use   Smoking status: Never Smoker   Smokeless tobacco: Never Used  Substance and Sexual Activity   Alcohol use: No    Alcohol/week: 0.0 standard drinks   Drug use: No   Sexual activity: Not on file  Lifestyle   Physical activity    Days per week: Not on file    Minutes per session: Not on file   Stress: Not on file  Relationships   Social connections    Talks on phone: Not on file    Gets together: Not on file    Attends religious service: Not on file    Active member of club or organization: Not on file    Attends meetings of clubs or organizations: Not on file    Relationship status: Not on file   Intimate partner violence    Fear of current or ex partner: Not on file    Emotionally abused: Not on file    Physically abused: Not on file    Forced sexual activity: Not on file  Other Topics Concern   Not on file  Social History Narrative   Not on file    Patient Active Problem List   Diagnosis Date Noted   Vertigo 04/07/2018   Tubular adenoma of colon 03/10/2017   Tinea pedis of left foot 11/22/2016   Shoulder contusion 11/22/2015   Type II diabetes mellitus with complication ([place]) 62/83/6629   Essential (primary) hypertension 08/01/2014   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   Arthritis of knee, degenerative 08/01/2014   Low back strain 08/01/2014    Past Surgical History:  Procedure Laterality Date   BLEPHAROPLASTY Bilateral 12/19/2017   CARPAL TUNNEL RELEASE Bilateral 1985   CHOLECYSTECTOMY     COLONOSCOPY WITH PROPOFOL N/A 03/07/2017   Procedure: COLONOSCOPY WITH PROPOFOL;  Surgeon: [name], MD;  Location[facility] END[facility]COPY;  Service: Gastroenterology;  Laterality: N/A;   DILATION AND CURETTAGE OF UTERUS  12/2011   benign endometrial polyp   HALLUX VALGUS AUSTIN Right 09/02/2014   Procedure: [place];  Surgeon: Samara Deist, DPM;  Location: [facility] ORS;  Service: Podiatry;  Laterality: Right;   HERNIA  REPAIR     LAPAROSCOPIC GASTRIC BANDING  2010   Lap Band   lymph node removed Right    TOTAL KNEE ARTHROPLASTY Bilateral 2003, 2004   TOTAL KNEE ARTHROPLASTY Bilateral    VENTRAL HERNIA REPAIR  2008    Her family history includes Breast cancer in her paternal aunt; Breast cancer (age of onset: 14) in her mother; Liver disease in her brother.     Current Meds  Medication Sig   acetaminophen (TYLENOL) 500 MG tablet Take 1,000 mg by mouth at bedtime as needed.   BENICAR HCT 40-25 MG tablet TAKE 1 TABLET DAILY   celecoxib (CELEBREX) 200 MG capsule TAKE 1 CAPSULE DAILY   cyclobenzaprine (FLEXERIL) 10 MG tablet Take 10 mg by mouth 3 (three) times daily as needed for muscle spasms.   glucose blood (FREESTYLE LITE) test strip Test Blood Sugar Twice Daily.   Lancets (FREESTYLE) lancets Test Blood Sugar twice daily.   metFORMIN (GLUCOPHAGE) 500 MG tablet TAKE 1 TABLET THREE TIMES A DAY AFTER MEALS (Patient taking differently: Take 500 mg by mouth 2 (two) times  daily. )    Patient Care Team: Glean Hess, MD as PCP - General (Internal Medicine) Kem Parkinson, MD (Ophthalmology)       Objective:   Wt Readings from Last 3 Encounters:  10/06/18 (!) 304 lb (137.9 kg)  04/17/18 (!) 307 lb (139.3 kg)  03/25/18 (!) 304 lb (137.9 kg)    Vitals: BP 136/82    Pulse 70    Ht 5\' 4"  (1.626 m)    Wt (!) 304 lb (137.9 kg)    SpO2 97%    BMI 52.18 kg/m   Physical Exam  Activities of Daily Living In your present state of health, do you have any difficulty performing the following activities: 10/06/2018 03/25/2018  Hearing? N N  Vision? N N  Comment wears glasses -  Difficulty concentrating or making decisions? N N  Walking or climbing stairs? N N  Dressing or bathing? N N  Doing errands, shopping? N N  Some recent data might be hidden    Fall Risk Assessment Fall Risk  10/06/2018 04/17/2018 03/25/2018 01/13/2018 11/22/2016  Falls in the past year? 0 0 0 0 No  Comment - - -  Emmi Telephone Survey: data to providers prior to load -  Number falls in past yr: 0 0 0 - -  Injury with Fall? 0 0 0 - -  Follow up Falls evaluation completed Falls evaluation completed Falls evaluation completed - -     Depression Screen PHQ 2/9 Scores 10/06/2018 04/17/2018 03/25/2018 09/23/2017  PHQ - 2 Score 0 0 0 0  PHQ- 9 Score 0 - - -    6CIT Screen 10/06/2018 11/22/2016 11/22/2015  What Year? 0 points 0 points 0 points  What month? 0 points 0 points 0 points  What time? 0 points 0 points 0 points  Count back from 20 0 points 0 points 0 points  Months in reverse 0 points 2 points 0 points  Repeat phrase 4 points 2 points 0 points  Total Score 4 4 0    Medicare Annual Wellness Visit Summary:  Reviewed patient's Family Medical History Reviewed and updated list of patient's medical providers Assessment of cognitive impairment was done Assessed patient's functional ability Established a written schedule for health screening [place] Completed and Reviewed  Exercise Activities and Dietary recommendations Goals     Prevent Falls       Immunization History  Administered Date(s) Administered   Pneumococcal Conjugate-13 03/11/2014   Pneumococcal Polysaccharide-23 09/04/2012   Zoster 03/30/2013    Health Maintenance  Topic Date Due   DEXA SCAN  10/29/2012   OPHTHALMOLOGY EXAM  05/09/2017   MAMMOGRAM  05/08/2018   HEMOGLOBIN A1C  09/23/2018   FOOT EXAM  09/24/2018   INFLUENZA VACCINE  09/26/2018   TETANUS/TDAP  03/26/2019 (Originally 10/30/1966)   Hepatitis C Screening  Completed    Discussed health benefits of physical activity, and encouraged her to engage in regular exercise appropriate for her age and condition.    ------------------------------------------------------------------------------------------------------------  Assessment & Plan:  1. Medicare annual wellness visit, subsequent Measures satisfied Unable to order DEXA -  not covered by insurance (no hx of fracture) so recommend calcium + D and exercise as able  2. Essential (primary) hypertension Borderline control on current regimen - pt to continue to check at home Consider increase in dose if persistently elevated - CBC with Differential/Platelet - Comprehensive metabolic panel - POCT urinalysis dipstick - asx bactiuria so will not treat  3. Type II  diabetes mellitus with complication (HCC) On oral agents alone with acceptable home glucoses Recommend statin but pt continues to decline treatment Will obtain DM eye exam report from the last year - Hemoglobin A1c - Lipid panel - TSH  4. Encounter for screening mammogram for breast cancer Pt will schedule later this year once covid declines - MM 3D SCREEN BREAST BILATERAL; Future  5. Multiple risk factors for coronary artery disease Re-enforced low salt, low fat diet; regular exercise as able Will discuss statin therapy again if lipids are worsening   Partially dictated using Editor, commissioning. Any errors are unintentional.  Halina Maidens, MD [facility]  10/06/2018

## 2018-10-06 NOTE — Progress Notes (Deleted)
    Date:  10/06/2018   Name:  Megan Shields   DOB:  1945/11/21   MRN:  [id number]   Chief Complaint: No chief complaint on file. Megan Shields is a 73 y.o. female who presents today for her annual exam. She feels {DESC; WELL/FAIRLY WELL/POORLY:18703}. She reports exercising ***. She reports she is sleeping {DESC; WELL/FAIRLY WELL/POORLY:18703}. She denies breast complaints.    Mammogram  04/2017 Colonoscopy  02/2017 Immunizations are up to date DEXA - none   Hypertension  Diabetes   Lab Results  Component Value Date   HGBA1C 7.0 (H) 03/25/2018   Lab Results  Component Value Date   CHOL 187 09/23/2017   HDL 69 09/23/2017   LDLCALC 99 09/23/2017   TRIG 97 09/23/2017   CHOLHDL 2.7 09/23/2017   Lab Results  Component Value Date   CREATININE 0.91 03/25/2018   BUN 16 03/25/2018   NA 139 03/25/2018   K 4.5 03/25/2018   CL 97 03/25/2018   CO2 25 03/25/2018     Review of Systems  Patient Active Problem List   Diagnosis Date Noted  . Vertigo 04/07/2018  . Tubular adenoma of colon 03/10/2017  . Tinea pedis of left foot 11/22/2016  . Shoulder contusion 11/22/2015  . Type II diabetes mellitus with complication ([place]) 40/98/1191  . Essential (primary) hypertension 08/01/2014  . 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 Arthritis of knee, degenerative 08/01/2014  . Low back strain 08/01/2014    Allergies  Allergen Reactions  . Pneumococcal Vaccines Hives  . Codeine     Other reaction(s): drowsiness  . Amoxicillin Rash    Past Surgical History:  Procedure Laterality Date  . BLEPHAROPLASTY Bilateral 12/19/2017  . CARPAL TUNNEL RELEASE Bilateral 1985  . CHOLECYSTECTOMY    . COLONOSCOPY WITH PROPOFOL N/A 03/07/2017   Procedure: COLONOSCOPY WITH PROPOFOL;  Surgeon: [name], MD;  Location: [facility] ENDOSCOPY;  Service: Gastroenterology;  Laterality: N/A;  . DILATION AND CURETTAGE OF UTERUS  12/2011   benign endometrial polyp  . HALLUX VALGUS AUSTIN Right 09/02/2014   Procedure: [place];  Surgeon: Samara Deist, DPM;  Location: [facility] ORS;  Service: Podiatry;  Laterality: Right;  . HERNIA REPAIR    . LAPAROSCOPIC GASTRIC BANDING  2010   Lap Band  . lymph node removed Right   . TOTAL KNEE ARTHROPLASTY Bilateral 2003, 2004  . TOTAL KNEE ARTHROPLASTY Bilateral   . VENTRAL HERNIA REPAIR  2008    Social History   Tobacco Use  . Smoking status: Never Smoker  . Smokeless tobacco: Never Used  Substance Use Topics  . Alcohol use: No    Alcohol/week: 0.0 standard drinks  . Drug use: No     Medication list has been reviewed and updated.  No outpatient medications have been marked as taking for the 10/06/18 encounter (Appointment) with Glean Hess, MD.    [facility] 2/9 Scores 04/17/2018 03/25/2018 09/23/2017 11/22/2016  PHQ - 2 Score 0 0 0 0    BP Readings from Last 3 Encounters:  04/17/18 122/62  03/25/18 114/80  09/23/17 138/76    Physical Exam  Wt Readings from Last 3 Encounters:  04/17/18 (!) 307 lb (139.3 kg)  03/25/18 (!) 304 lb (137.9 kg)  09/23/17 299 lb (135.6 kg)    There were no vitals taken for this visit.  Assessment and Plan:

## 2018-10-07 LAB — COMPREHENSIVE METABOLIC PANEL
ALT: 28 IU/L (ref 0–32)
AST: 34 IU/L (ref 0–40)
Albumin/Globulin Ratio: 1.4 (ref 1.2–2.2)
Albumin: 4.5 g/dL (ref 3.8–4.8)
Alkaline Phosphatase: 67 IU/L (ref 39–117)
BUN/Creatinine Ratio: 19 (ref 12–28)
BUN: 18 mg/dL (ref 8–27)
Bilirubin Total: 0.4 mg/dL (ref 0.0–1.2)
CO2: 25 mmol/L (ref 20–29)
Calcium: 9.7 mg/dL (ref 8.7–10.3)
Chloride: 94 mmol/L — ABNORMAL LOW (ref 96–106)
Creatinine, Ser: 0.97 mg/dL (ref 0.57–1.00)
GFR calc Af Amer: 68 mL/min/{1.73_m2} (ref >59)
GFR calc non Af Amer: 59 mL/min/{1.73_m2} — ABNORMAL LOW (ref >59)
Globulin, Total: 3.3 g/dL (ref 1.5–4.5)
Glucose: 142 mg/dL — ABNORMAL HIGH (ref 65–99)
Potassium: 4.3 mmol/L (ref 3.5–5.2)
Sodium: 139 mmol/L (ref 134–144)
Total Protein: 7.8 g/dL (ref 6.0–8.5)

## 2018-10-07 LAB — CBC WITH DIFFERENTIAL/PLATELET
Basophils Absolute: 0 10*3/uL (ref 0.0–0.2)
Basos: 1 %
EOS (ABSOLUTE): 0.2 10*3/uL (ref 0.0–0.4)
Eos: 5 %
Hematocrit: 47.8 % — ABNORMAL HIGH (ref 34.0–46.6)
Hemoglobin: 15.3 g/dL (ref 11.1–15.9)
Immature Grans (Abs): 0 10*3/uL (ref 0.0–0.1)
Immature Granulocytes: 0 %
Lymphocytes Absolute: 1.3 10*3/uL (ref 0.7–3.1)
Lymphs: 30 %
MCH: 27.3 pg (ref 26.6–33.0)
MCHC: 32 g/dL (ref 31.5–35.7)
MCV: 85 fL (ref 79–97)
Monocytes Absolute: 0.4 10*3/uL (ref 0.1–0.9)
Monocytes: 10 %
Neutrophils Absolute: 2.4 10*3/uL (ref 1.4–7.0)
Neutrophils: 54 %
Platelets: 279 10*3/uL (ref 150–450)
RBC: 5.61 x10E6/uL — ABNORMAL HIGH (ref 3.77–5.28)
RDW: 12.7 % (ref 11.7–15.4)
WBC: 4.4 10*3/uL (ref 3.4–10.8)

## 2018-10-07 LAB — HEMOGLOBIN A1C
Est. average glucose Bld gHb Est-mCnc: 177 mg/dL
Hgb A1c MFr Bld: 7.8 % — ABNORMAL HIGH (ref 4.8–5.6)

## 2018-10-07 LAB — LIPID PANEL
Chol/HDL Ratio: 2.6 ratio (ref 0.0–4.4)
Cholesterol, Total: 189 mg/dL (ref 100–199)
HDL: 73 mg/dL (ref >39)
LDL Calculated: 95 mg/dL (ref 0–99)
Triglycerides: 103 mg/dL (ref 0–149)
VLDL Cholesterol Cal: 21 mg/dL (ref 5–40)

## 2018-10-07 LAB — TSH: TSH: 2.07 u[IU]/mL (ref 0.450–4.500)

## 2018-11-23 ENCOUNTER — Other Ambulatory Visit: Payer: Self-pay | Admitting: Internal Medicine

## 2018-11-29 ENCOUNTER — Other Ambulatory Visit: Payer: Self-pay | Admitting: Internal Medicine

## 2018-11-29 ENCOUNTER — Encounter: Payer: Self-pay | Admitting: Internal Medicine

## 2018-11-29 DIAGNOSIS — E118 Type 2 diabetes mellitus with unspecified complications: Secondary | ICD-10-CM

## 2018-11-29 MED ORDER — FREESTYLE LITE DEVI: 1.0000 | Freq: Two times a day (BID) | 0 refills | Status: DC

## 2019-02-16 ENCOUNTER — Other Ambulatory Visit: Payer: Self-pay | Admitting: Internal Medicine

## 2019-04-08 ENCOUNTER — Other Ambulatory Visit: Payer: Self-pay

## 2019-04-08 ENCOUNTER — Encounter: Payer: Self-pay | Admitting: Internal Medicine

## 2019-04-08 ENCOUNTER — Ambulatory Visit (INDEPENDENT_AMBULATORY_CARE_PROVIDER_SITE_OTHER): Payer: Medicare Other | Admitting: Internal Medicine

## 2019-04-08 VITALS — BP 130/76 | HR 65 | Temp 98.1°F | Ht 64.0 in | Wt 297.0 lb

## 2019-04-08 DIAGNOSIS — E118 Type 2 diabetes mellitus with unspecified complications: Secondary | ICD-10-CM | POA: Diagnosis not present

## 2019-04-08 DIAGNOSIS — I10 Essential (primary) hypertension: Secondary | ICD-10-CM

## 2019-04-08 DIAGNOSIS — E1169 Type 2 diabetes mellitus with other specified complication: Secondary | ICD-10-CM | POA: Diagnosis not present

## 2019-04-08 DIAGNOSIS — L719 Rosacea, unspecified: Secondary | ICD-10-CM

## 2019-04-08 DIAGNOSIS — E785 Hyperlipidemia, unspecified: Secondary | ICD-10-CM | POA: Diagnosis not present

## 2019-04-08 NOTE — Progress Notes (Signed)
Date:  04/08/2019   Name:  Megan Shields   DOB:  07/08/1945   MRN:  [id number]   Chief Complaint: Diabetes (Follow up.) and Hypertension  Hypertension This is a chronic problem. The problem is controlled (at home 132/77). Pertinent negatives include no chest pain, headaches, palpitations or shortness of breath. Past treatments include angiotensin blockers and diuretics. The current treatment provides significant improvement. There are no compliance problems.  There is no history of kidney disease or CAD/MI.  Diabetes She presents for her follow-up diabetic visit. She has type 2 diabetes mellitus. Her disease course has been improving. Pertinent negatives for hypoglycemia include no dizziness, headaches or tremors. Pertinent negatives for diabetes include no chest pain, no fatigue, no polydipsia and no polyuria. Current diabetic treatment includes oral agent (monotherapy) (metformin bid). She is compliant with treatment all of the time. Her weight is decreasing steadily (on a new low carb diet and has lost 7 lbs in one month). She monitors blood glucose at home 1-2 x per day. There is no change in her home blood glucose trend. Her breakfast blood glucose is taken between 6-7 am. Her breakfast blood glucose range is generally 110-130 mg/dl. An ACE inhibitor/angiotensin II receptor blocker is being taken. Eye exam is not current.    Lab Results  Component Value Date   CREATININE 0.97 10/06/2018   BUN 18 10/06/2018   NA 139 10/06/2018   K 4.3 10/06/2018   CL 94 (L) 10/06/2018   CO2 25 10/06/2018   Lab Results  Component Value Date   CHOL 189 10/06/2018   HDL 73 10/06/2018   LDLCALC 95 10/06/2018   TRIG 103 10/06/2018   CHOLHDL 2.6 10/06/2018   Lab Results  Component Value Date   TSH 2.070 10/06/2018   Lab Results  Component Value Date   HGBA1C 7.8 (H) 10/06/2018     Review of Systems  Constitutional: Negative for appetite change, fatigue, fever and unexpected weight change.    HENT: Negative for tinnitus and trouble swallowing.   Eyes: Negative for visual disturbance.  Respiratory: Negative for cough, chest tightness and shortness of breath.   Cardiovascular: Negative for chest pain, palpitations and leg swelling.  Gastrointestinal: Negative for abdominal pain.  Endocrine: Negative for polydipsia and polyuria.  Genitourinary: Negative for dysuria and hematuria.  Musculoskeletal: Negative for arthralgias.  Neurological: Negative for dizziness, tremors, light-headedness, numbness and headaches.  Psychiatric/Behavioral: Negative for dysphoric mood.    Patient Active Problem List   Diagnosis Date Noted  . Vertigo 04/07/2018  . Tubular adenoma of colon 03/10/2017  . Tinea pedis of left foot 11/22/2016  . Shoulder contusion 11/22/2015  . Type II diabetes mellitus with complication ([place]) 78/58/8502  . Essential (primary) hypertension 08/01/2014  . 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 Arthritis of knee, degenerative 08/01/2014  . Low back strain 08/01/2014    Allergies  Allergen Reactions  . Pneumococcal Vaccines Hives  . Codeine     Other reaction(s): drowsiness  . Amoxicillin Rash    Past Surgical History:  Procedure Laterality Date  . BLEPHAROPLASTY Bilateral 12/19/2017  . CARPAL TUNNEL RELEASE Bilateral 1985  . CHOLECYSTECTOMY    . COLONOSCOPY WITH PROPOFOL N/A 03/07/2017   Procedure: COLONOSCOPY WITH PROPOFOL;  Surgeon: [name], MD;  Location: [facility] ENDOSCOPY;  Service: Gastroenterology;  Laterality: N/A;  . DILATION AND CURETTAGE OF UTERUS  12/2011   benign endometrial polyp  . HALLUX VALGUS AUSTIN Right 09/02/2014   Procedure: HALLUX VALGUS AUSTIN;  Surgeon:  Samara Deist, DPM;  Location: [facility] ORS;  Service: Podiatry;  Laterality: Right;  . HERNIA REPAIR    . LAPAROSCOPIC GASTRIC BANDING  2010   Lap Band  . lymph node removed Right   . TOTAL KNEE ARTHROPLASTY Bilateral 2003, 2004  . TOTAL KNEE ARTHROPLASTY Bilateral   . VENTRAL HERNIA  REPAIR  2008    Social History   Tobacco Use  . Smoking status: Never Smoker  . Smokeless tobacco: Never Used  Substance Use Topics  . Alcohol use: No    Alcohol/week: 0.0 standard drinks  . Drug use: No     Medication list has been reviewed and updated.  Current Meds  Medication Sig  . acetaminophen (TYLENOL) 500 MG tablet Take 1,000 mg by mouth at bedtime as needed.  . Blood Glucose Monitoring Suppl (FREESTYLE LITE) DEVI 1 each by Does not apply route 2 (two) times daily.  . celecoxib (CELEBREX) 200 MG capsule TAKE 1 CAPSULE DAILY  . cyclobenzaprine (FLEXERIL) 10 MG tablet Take 10 mg by mouth 3 (three) times daily as needed for muscle spasms.  Marland Kitchen glucose blood (FREESTYLE LITE) test strip TEST BLOOD SUGAR TWICE A DAY  . Lancets (FREESTYLE) lancets TEST BLOOD SUGAR TWICE A DAY  . metFORMIN (GLUCOPHAGE) 500 MG tablet TAKE 1 TABLET THREE TIMES A DAY AFTER MEALS (Patient taking differently: Take 500 mg by mouth 2 (two) times daily. )  . metroNIDAZOLE (METROGEL) 1 % gel Apply 1 application topically as needed.   Marland Kitchen olmesartan-hydrochlorothiazide (BENICAR HCT) 40-25 MG tablet TAKE 1 TABLET DAILY    PHQ 2/9 Scores 04/08/2019 10/06/2018 04/17/2018 03/25/2018  PHQ - 2 Score 0 0 0 0  PHQ- 9 Score 0 0 - -    BP Readings from Last 3 Encounters:  04/08/19 130/76  10/06/18 136/82  04/17/18 122/62    Physical Exam Vitals and nursing note reviewed.  Constitutional:      General: She is not in acute distress.    Appearance: She is well-developed.  HENT:     Head: Normocephalic and atraumatic.  Cardiovascular:     Rate and Rhythm: Normal rate and regular rhythm.     Pulses: Normal pulses.     Heart sounds: No murmur.  Pulmonary:     Effort: Pulmonary effort is normal. No respiratory distress.     Breath sounds: No wheezing or rhonchi.  Musculoskeletal:     Cervical back: Normal range of motion.     Right lower leg: No edema.     Left lower leg: No edema.  Lymphadenopathy:      Cervical: No cervical adenopathy.  Skin:    General: Skin is warm and dry.     Capillary Refill: Capillary refill takes less than 2 seconds.     Findings: Lesion present. No rash.     Comments: Scattered punctate lesions on forehead; redness of the nose c/w rosacea  Neurological:     General: No focal deficit present.     Mental Status: She is alert and oriented to person, place, and time.  Psychiatric:        Behavior: Behavior normal.        Thought Content: Thought content normal.     Wt Readings from Last 3 Encounters:  04/08/19 297 lb (134.7 kg)  10/06/18 (!) 304 lb (137.9 kg)  04/17/18 (!) 307 lb (139.3 kg)    BP 130/76   Pulse 65   Temp 98.1 F (36.7 C) (Oral)   Ht 5\' 4"  (1.626  m)   Wt 297 lb (134.7 kg)   SpO2 97%   BMI 50.98 kg/m   Assessment and Plan: 1. Essential (primary) hypertension Clinically stable exam with well controlled BP on olmesartan hct. Tolerating medications without side effects at this time. Pt to continue current regimen and low sodium diet; benefits of regular exercise as able discussed.  2. Type II diabetes mellitus with complication ([place]) Clinically stable by exam and report without s/s of hypoglycemia. DM complicated by htn, lipids.  Pt is not interested in taking statin medication. Tolerating medications metformin bid well without side effects or other concerns. - Hemoglobin A1c  3. Rosacea Seeing Dermatology; now using topical metronidazole with minimal improvement   Partially dictated using [place]. Any errors are unintentional.  Halina Maidens, MD [facility]  04/08/2019

## 2019-04-09 LAB — HEMOGLOBIN A1C
Est. average glucose Bld gHb Est-mCnc: 180 mg/dL
Hgb A1c MFr Bld: 7.9 % — ABNORMAL HIGH (ref 4.8–5.6)

## 2019-04-09 NOTE — Progress Notes (Signed)
Called and spoke with the patient about labs and Januvia. She said she "seen this on tv and it has a lot of side effects." She does not want to take this. Wants to know if there is anything else you recommend?

## 2019-07-30 ENCOUNTER — Other Ambulatory Visit: Payer: Self-pay | Admitting: Internal Medicine

## 2019-08-02 DIAGNOSIS — E119 Type 2 diabetes mellitus without complications: Secondary | ICD-10-CM | POA: Diagnosis not present

## 2019-08-02 LAB — HM DIABETES EYE EXAM

## 2019-08-03 ENCOUNTER — Encounter: Payer: Self-pay | Admitting: Internal Medicine

## 2019-08-09 ENCOUNTER — Ambulatory Visit: Payer: Self-pay

## 2019-08-09 NOTE — Telephone Encounter (Signed)
Advised and noted.   Thanks, CM

## 2019-08-09 NOTE — Telephone Encounter (Signed)
Pt. Reports she started having some vaginal bleeding 2 days ago. Spotting with mild cramping. Her OB/GYN has retired, and the new doctor in the practice cannot see her until September. Offered appointment with PCP. States she will call "the GYN in [place] and see if they can see me." Instructed to call back as needed.   Answer Assessment - Initial Assessment Questions 1. AMOUNT: "Describe the bleeding that you are having." "How much bleeding is there?"    - SPOTTING: spotting, or pinkish / brownish mucous discharge; does not fill panti-liner or pad    - MILD:  less than 1 pad / hour; less than patient's  menstrual bleeding when she still had menstrual periods   - MODERATE: 1-2 pads / hour; small-medium blood clots (e.g., pea, grape, small coin)    - SEVERE: soaking 2 or more pads/hour for 2 or more hours; bleeding not contained by pads or continuous red blood from vagina; large blood clots (e.g., golf ball, large coin)      Spotting 2. ONSET: "When did the bleeding begin?" "Is it continuing now?"     2 days ago 3. MENOPAUSE: "When was your last menstrual period?"      Years 4. ABDOMINAL PAIN: "Do you have any pain?" "How bad is the pain?"  (e.g., Scale 1-10; mild, moderate, or severe)   - MILD (1-3): doesn't interfere with normal activities, abdomen soft and not tender to touch    - MODERATE (4-7): interferes with normal activities or awakens from sleep, tender to touch    - SEVERE (8-10): excruciating pain, doubled over, unable to do any normal activities      Cramping - mild 5. BLOOD THINNERS: "Do you take any blood thinners?" (e.g., Coumadin/warfarin, Pradaxa/dabigatran, aspirin)     No 6. HORMONES: "Are you taking any hormone medications, prescription or OTC?" (e.g., birth control pills, estrogen)     No 7. CAUSE: "What do you think is causing the bleeding?" (e.g., recent gyn surgery, recent gyn procedure; known bleeding disorder, uterine cancer)       No 8. HEMODYNAMIC STATUS: "Are you  weak or feeling lightheaded?" If so, ask: "Can you stand and walk normally?"       No 9. OTHER SYMPTOMS: "What other symptoms are you having with the bleeding?" (e.g., back pain, burning with urination, fever)     No  Protocols used: VAGINAL BLEEDING - POSTMENOPAUSAL-A-AH

## 2019-09-01 ENCOUNTER — Other Ambulatory Visit: Payer: Self-pay

## 2019-09-01 ENCOUNTER — Ambulatory Visit
Admission: RE | Admit: 2019-09-01 | Discharge: 2019-09-01 | Disposition: A | Discharge status: Home / Self Care | Payer: Medicare Other | Source: Ambulatory Visit | Attending: Internal Medicine | Admitting: Internal Medicine

## 2019-09-01 DIAGNOSIS — Z1231 Encounter for screening mammogram for malignant neoplasm of breast: Secondary | ICD-10-CM

## 2019-09-14 DIAGNOSIS — N950 Postmenopausal bleeding: Secondary | ICD-10-CM | POA: Diagnosis not present

## 2019-09-15 ENCOUNTER — Other Ambulatory Visit: Payer: Self-pay | Admitting: Internal Medicine

## 2019-10-06 ENCOUNTER — Encounter: Payer: Self-pay | Admitting: Internal Medicine

## 2019-10-06 ENCOUNTER — Ambulatory Visit (INDEPENDENT_AMBULATORY_CARE_PROVIDER_SITE_OTHER): Payer: Medicare Other | Admitting: Internal Medicine

## 2019-10-06 ENCOUNTER — Other Ambulatory Visit: Payer: Self-pay | Admitting: Internal Medicine

## 2019-10-06 ENCOUNTER — Other Ambulatory Visit: Payer: Self-pay

## 2019-10-06 VITALS — BP 128/76 | HR 66 | Temp 98.4°F | Ht 64.0 in | Wt 288.0 lb

## 2019-10-06 DIAGNOSIS — E1169 Type 2 diabetes mellitus with other specified complication: Secondary | ICD-10-CM | POA: Diagnosis not present

## 2019-10-06 DIAGNOSIS — E785 Hyperlipidemia, unspecified: Secondary | ICD-10-CM | POA: Diagnosis not present

## 2019-10-06 DIAGNOSIS — E559 Vitamin D deficiency, unspecified: Secondary | ICD-10-CM

## 2019-10-06 DIAGNOSIS — E118 Type 2 diabetes mellitus with unspecified complications: Secondary | ICD-10-CM

## 2019-10-06 DIAGNOSIS — I10 Essential (primary) hypertension: Secondary | ICD-10-CM

## 2019-10-06 NOTE — Progress Notes (Signed)
Date:  10/06/2019   Name:  Megan Shields   DOB:  1946/01/12   MRN:  [id number]   Chief Complaint: Annual Exam (breast exam no pap) and Ear Drainage (left ear, clicking nose when cleaning out ear, hard to hear on that side)  Megan Shields is a 74 y.o. female who presents today for her Complete Annual Exam. She feels fairly well. She reports exercising walking X6-7 days a week. She reports she is sleeping well. Breast complaints none.  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  Mammogram: 08/2019 DEXA: none Pap smear: discontinued Colonoscopy: 02/2017  Immunization History  Administered Date(s) Administered  . PFIZER SARS-COV-2 Vaccination 03/26/2019, 04/16/2019  . Pneumococcal Conjugate-13 03/11/2014  . Pneumococcal Polysaccharide-23 09/04/2012  . Zoster 03/30/2013    Hypertension This is a chronic problem. The problem is controlled (normal BP at home). Pertinent negatives include no chest pain, headaches, palpitations or shortness of breath. Past treatments include angiotensin blockers and diuretics. The current treatment provides significant improvement. There are no compliance problems.   Diabetes She presents for her follow-up diabetic visit. She has type 2 diabetes mellitus. Her disease course has been stable. Pertinent negatives for hypoglycemia include no dizziness, headaches, nervousness/anxiousness or tremors. Pertinent negatives for diabetes include no chest pain, no fatigue, no polydipsia and no polyuria. Current diabetic treatment includes oral agent (monotherapy). She is compliant with treatment all of the time. Her weight is decreasing steadily. She monitors blood glucose at home 1-2 x per day. There is no change in her home blood glucose trend. Her breakfast blood glucose is taken between 7-8 am. Her breakfast blood glucose range is generally 110-130 mg/dl. Her dinner blood glucose is taken between 5-6 pm.  Her dinner blood glucose range is generally 130-140 mg/dl. An ACE inhibitor/angiotensin II receptor blocker is being taken. Eye exam is current.  Hyperlipidemia This is a chronic problem. The problem is uncontrolled. Pertinent negatives include no chest pain or shortness of breath. She is currently on no antihyperlipidemic treatment.    Lab Results  Component Value Date   CREATININE 0.97 10/06/2018   BUN 18 10/06/2018   NA 139 10/06/2018   K 4.3 10/06/2018   CL 94 (L) 10/06/2018   CO2 25 10/06/2018   Lab Results  Component Value Date   CHOL 189 10/06/2018   HDL 73 10/06/2018   LDLCALC 95 10/06/2018   TRIG 103 10/06/2018   CHOLHDL 2.6 10/06/2018   Lab Results  Component Value Date   TSH 2.070 10/06/2018   Lab Results  Component Value Date   HGBA1C 7.9 (H) 04/08/2019   Lab Results  Component Value Date   WBC 4.4 10/06/2018   HGB 15.3 10/06/2018   HCT 47.8 (H) 10/06/2018   MCV 85 10/06/2018   PLT 279 10/06/2018   Lab Results  Component Value Date   ALT 28 10/06/2018   AST 34 10/06/2018   ALKPHOS 67 10/06/2018   BILITOT 0.4 10/06/2018     Review of Systems  Constitutional: Negative for chills, fatigue and fever.  HENT: Negative for congestion, tinnitus, trouble swallowing and voice change. Hearing loss: and ear draining but no pain.   Eyes: Negative for visual disturbance.  Respiratory: Negative for cough, chest tightness, shortness of breath and wheezing.   Cardiovascular: Negative for chest pain, palpitations and leg swelling.  Gastrointestinal: Positive for abdominal pain. Negative for constipation, diarrhea and vomiting.  Endocrine: Negative for polydipsia and polyuria.  Genitourinary: Negative for dysuria, frequency, genital sores, vaginal bleeding and vaginal discharge.  Musculoskeletal: Negative for arthralgias, gait problem and joint swelling.  Skin: Negative for color change and rash.  Neurological: Negative for dizziness, tremors, light-headedness and  headaches.  Hematological: Negative for adenopathy. Does not bruise/bleed easily.  Psychiatric/Behavioral: Negative for dysphoric mood and sleep disturbance. The patient is not nervous/anxious.     Patient Active Problem List   Diagnosis Date Noted  . Rosacea 04/08/2019  . Hyperlipidemia associated with type 2 diabetes mellitus ([place]) 04/08/2019  . Vertigo 04/07/2018  . Tubular adenoma of colon 03/10/2017  . Tinea pedis of left foot 11/22/2016  . Type II diabetes mellitus with complication ([place]) 24/40/1027  . Essential (primary) hypertension 08/01/2014  . 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 Arthritis of knee, degenerative 08/01/2014  . Low back strain 08/01/2014    Allergies  Allergen Reactions  . Pneumococcal Vaccines Hives  . Codeine     Other reaction(s): drowsiness  . Amoxicillin Rash    Past Surgical History:  Procedure Laterality Date  . BLEPHAROPLASTY Bilateral 12/19/2017  . CARPAL TUNNEL RELEASE Bilateral 1985  . CHOLECYSTECTOMY    . COLONOSCOPY WITH PROPOFOL N/A 03/07/2017   Procedure: COLONOSCOPY WITH PROPOFOL;  Surgeon: [name], MD;  Location: [facility] END[facility]COPY;  Service: Gastroenterology;  Laterality: N/A;  . DILATION AND CURETTAGE OF UTERUS  12/2011   benign endometrial polyp  . HALLUX VALGUS AUSTIN Right 09/02/2014   Procedure: [place];  Surgeon: Samara Deist, DPM;  Location: [facility] ORS;  Service: Podiatry;  Laterality: Right;  . HERNIA REPAIR    . LAPAROSCOPIC GASTRIC BANDING  2010   Lap Band  . lymph node removed Right   . TOTAL KNEE ARTHROPLASTY Bilateral 2003, 2004  . TOTAL KNEE ARTHROPLASTY Bilateral   . VENTRAL HERNIA REPAIR  2008    Social History   Tobacco Use  . Smoking status: Never Smoker  . Smokeless tobacco: Never Used  Vaping Use  . Vaping Use: Never used  Substance Use Topics  . Alcohol use: No    Alcohol/week: 0.0 standard drinks  . Drug use: No     Medication list has been reviewed and updated.  Current Meds    Medication Sig  . acetaminophen (TYLENOL) 500 MG tablet Take 1,000 mg by mouth at bedtime as needed.  . Blood Glucose Monitoring Suppl (FREESTYLE LITE) DEVI 1 each by Does not apply route 2 (two) times daily.  . celecoxib (CELEBREX) 200 MG capsule TAKE 1 CAPSULE DAILY  . cyclobenzaprine (FLEXERIL) 10 MG tablet Take 10 mg by mouth 3 (three) times daily as needed for muscle spasms.  Marland Kitchen glucose blood (FREESTYLE LITE) test strip TEST BLOOD SUGAR TWICE A DAY  . Lancets (FREESTYLE) lancets TEST BLOOD SUGAR TWICE A DAY  . metFORMIN (GLUCOPHAGE) 500 MG tablet TAKE 1 TABLET THREE TIMES A DAY AFTER MEALS (Patient taking differently: in the morning and at bedtime. )  . olmesartan-hydrochlorothiazide (BENICAR HCT) 40-25 MG tablet TAKE 1 TABLET DAILY    PHQ 2/9 Scores 04/08/2019 10/06/2018 04/17/2018 03/25/2018  PHQ - 2 Score 0 0 0 0  PHQ- 9 Score 0 0 - -    No flowsheet data found.  BP Readings from Last 3 Encounters:  10/06/19 128/76  04/08/19 130/76  10/06/18 136/82    Physical Exam Vitals and nursing note reviewed.  Constitutional:      General: She is not in acute distress.    Appearance:  She is well-developed.  HENT:     Head: Normocephalic and atraumatic.     Right Ear: Tympanic membrane and ear canal normal.     Left Ear: Tympanic membrane and ear canal normal.     Nose:     Right Sinus: No maxillary sinus tenderness.     Left Sinus: No maxillary sinus tenderness.  Eyes:     General: No scleral icterus.       Right eye: No discharge.        Left eye: No discharge.     Conjunctiva/sclera: Conjunctivae normal.  Neck:     Thyroid: No thyromegaly.     Vascular: No carotid bruit.  Cardiovascular:     Rate and Rhythm: Normal rate and regular rhythm.     Pulses: Normal pulses.     Heart sounds: Normal heart sounds.  Pulmonary:     Effort: Pulmonary effort is normal. No respiratory distress.     Breath sounds: No wheezing.  Chest:     Breasts:        Right: No mass, nipple  discharge, skin change or tenderness.        Left: No mass, nipple discharge, skin change or tenderness.  Abdominal:     General: Bowel sounds are normal.     Palpations: Abdomen is soft.     Tenderness: There is no abdominal tenderness.    Musculoskeletal:     Cervical back: Normal range of motion. No erythema.     Right lower leg: No edema.     Left lower leg: No edema.  Lymphadenopathy:     Cervical: No cervical adenopathy.  Skin:    General: Skin is warm and dry.     Findings: No rash.  Neurological:     Mental Status: She is alert and oriented to person, place, and time.     Cranial Nerves: No cranial nerve deficit.     Sensory: No sensory deficit.     Deep Tendon Reflexes: Reflexes are normal and symmetric.  Psychiatric:        Attention and Perception: Attention normal.        Mood and Affect: Mood normal.     Wt Readings from Last 3 Encounters:  10/06/19 288 lb (130.6 kg)  04/08/19 297 lb (134.7 kg)  10/06/18 (!) 304 lb (137.9 kg)    BP 128/76   Pulse 66   Temp 98.4 F (36.9 C) (Oral)   Ht 5\' 4"  (1.626 m)   Wt 288 lb (130.6 kg)   SpO2 95%   BMI 49.44 kg/m   Assessment and Plan: 1. Essential (primary) hypertension Clinically stable exam with well controlled BP on benicar hct. Tolerating medications without side effects at this time. Pt to continue current regimen and low sodium diet; benefits of regular exercise as able discussed. - CBC with Differential/Platelet - POCT urinalysis dipstick  2. Type II diabetes mellitus with complication ([facility]) Clinically stable by exam and report without s/s of hypoglycemia. DM complicated by htn. Tolerating medications well without side effects or other concerns. - Comprehensive metabolic panel - Hemoglobin A1c - TSH  3. Hyperlipidemia associated with type 2 diabetes mellitus ([place]) Continue fish oil supplements Will advise if statins are recommended - Lipid panel  4. Vitamin D deficiency Recommend DEXA but pt  states she is too busy right now - VITAMIN D 25 Hydroxy (Vit-D Deficiency, Fractures)   Partially dictated using Editor, commissioning. Any errors are unintentional.  Halina Maidens, MD [facility]  10/06/2019

## 2019-10-07 LAB — COMPREHENSIVE METABOLIC PANEL
ALT: 13 IU/L (ref 0–32)
AST: 18 IU/L (ref 0–40)
Albumin/Globulin Ratio: 1.2 (ref 1.2–2.2)
Albumin: 3.8 g/dL (ref 3.7–4.7)
Alkaline Phosphatase: 61 IU/L (ref 48–121)
BUN/Creatinine Ratio: 22 (ref 12–28)
BUN: 19 mg/dL (ref 8–27)
Bilirubin Total: 0.4 mg/dL (ref 0.0–1.2)
CO2: 27 mmol/L (ref 20–29)
Calcium: 9.8 mg/dL (ref 8.7–10.3)
Chloride: 98 mmol/L (ref 96–106)
Creatinine, Ser: 0.86 mg/dL (ref 0.57–1.00)
GFR calc Af Amer: 79 mL/min/{1.73_m2} (ref >59)
GFR calc non Af Amer: 68 mL/min/{1.73_m2} (ref >59)
Globulin, Total: 3.2 g/dL (ref 1.5–4.5)
Glucose: 123 mg/dL — ABNORMAL HIGH (ref 65–99)
Potassium: 4.6 mmol/L (ref 3.5–5.2)
Sodium: 140 mmol/L (ref 134–144)
Total Protein: 7 g/dL (ref 6.0–8.5)

## 2019-10-07 LAB — HEMOGLOBIN A1C
Est. average glucose Bld gHb Est-mCnc: 180 mg/dL
Hgb A1c MFr Bld: 7.9 % — ABNORMAL HIGH (ref 4.8–5.6)

## 2019-10-07 LAB — LIPID PANEL
Chol/HDL Ratio: 2.7 ratio (ref 0.0–4.4)
Cholesterol, Total: 182 mg/dL (ref 100–199)
HDL: 68 mg/dL (ref >39)
LDL Chol Calc (NIH): 98 mg/dL (ref 0–99)
Triglycerides: 87 mg/dL (ref 0–149)
VLDL Cholesterol Cal: 16 mg/dL (ref 5–40)

## 2019-10-07 LAB — CBC WITH DIFFERENTIAL/PLATELET
Basophils Absolute: 0 10*3/uL (ref 0.0–0.2)
Basos: 1 %
EOS (ABSOLUTE): 0.2 10*3/uL (ref 0.0–0.4)
Eos: 6 %
Hematocrit: 46 % (ref 34.0–46.6)
Hemoglobin: 14.5 g/dL (ref 11.1–15.9)
Immature Grans (Abs): 0 10*3/uL (ref 0.0–0.1)
Immature Granulocytes: 0 %
Lymphocytes Absolute: 1.1 10*3/uL (ref 0.7–3.1)
Lymphs: 27 %
MCH: 27.1 pg (ref 26.6–33.0)
MCHC: 31.5 g/dL (ref 31.5–35.7)
MCV: 86 fL (ref 79–97)
Monocytes Absolute: 0.5 10*3/uL (ref 0.1–0.9)
Monocytes: 13 %
Neutrophils Absolute: 2.1 10*3/uL (ref 1.4–7.0)
Neutrophils: 53 %
Platelets: 248 10*3/uL (ref 150–450)
RBC: 5.36 x10E6/uL — ABNORMAL HIGH (ref 3.77–5.28)
RDW: 13.2 % (ref 11.7–15.4)
WBC: 4 10*3/uL (ref 3.4–10.8)

## 2019-10-07 LAB — VITAMIN D 25 HYDROXY (VIT D DEFICIENCY, FRACTURES): Vit D, 25-Hydroxy: 13.7 ng/mL — ABNORMAL LOW (ref 30.0–100.0)

## 2019-10-07 LAB — TSH: TSH: 1.68 u[IU]/mL (ref 0.450–4.500)

## 2019-10-25 DIAGNOSIS — I10 Essential (primary) hypertension: Secondary | ICD-10-CM | POA: Diagnosis not present

## 2019-10-25 DIAGNOSIS — R112 Nausea with vomiting, unspecified: Secondary | ICD-10-CM | POA: Diagnosis not present

## 2019-10-25 DIAGNOSIS — E1169 Type 2 diabetes mellitus with other specified complication: Secondary | ICD-10-CM | POA: Diagnosis not present

## 2019-10-27 DIAGNOSIS — Z880 Allergy status to penicillin: Secondary | ICD-10-CM | POA: Diagnosis not present

## 2019-10-27 DIAGNOSIS — Z803 Family history of malignant neoplasm of breast: Secondary | ICD-10-CM | POA: Diagnosis not present

## 2019-10-27 DIAGNOSIS — E669 Obesity, unspecified: Secondary | ICD-10-CM | POA: Diagnosis not present

## 2019-10-27 DIAGNOSIS — N840 Polyp of corpus uteri: Secondary | ICD-10-CM | POA: Diagnosis not present

## 2019-10-27 DIAGNOSIS — N8502 Endometrial intraepithelial neoplasia [EIN]: Secondary | ICD-10-CM | POA: Diagnosis not present

## 2019-10-27 DIAGNOSIS — Z79899 Other long term (current) drug therapy: Secondary | ICD-10-CM | POA: Diagnosis not present

## 2019-10-27 DIAGNOSIS — N950 Postmenopausal bleeding: Secondary | ICD-10-CM | POA: Diagnosis not present

## 2019-10-27 DIAGNOSIS — Z7984 Long term (current) use of oral hypoglycemic drugs: Secondary | ICD-10-CM | POA: Diagnosis not present

## 2019-10-27 DIAGNOSIS — Z9049 Acquired absence of other specified parts of digestive tract: Secondary | ICD-10-CM | POA: Diagnosis not present

## 2019-10-27 DIAGNOSIS — Z6841 Body Mass Index (BMI) 40.0 and over, adult: Secondary | ICD-10-CM | POA: Diagnosis not present

## 2019-10-27 DIAGNOSIS — Z9884 Bariatric surgery status: Secondary | ICD-10-CM | POA: Diagnosis not present

## 2019-10-27 DIAGNOSIS — I10 Essential (primary) hypertension: Secondary | ICD-10-CM | POA: Diagnosis not present

## 2019-10-27 DIAGNOSIS — E119 Type 2 diabetes mellitus without complications: Secondary | ICD-10-CM | POA: Diagnosis not present

## 2019-11-04 ENCOUNTER — Telehealth: Payer: Self-pay

## 2019-11-04 NOTE — Chronic Care Management (AMB) (Signed)
  Chronic Care Management   Note  11/04/2019 Name: Megan Shields MRN: [id number] DOB: [birth year]  Megan Shields is a 74 y.o. year old female who is a primary care patient of Glean Hess, MD. I reached out to Orland Jarred by phone today in response to a referral sent by Megan Shields's health plan.     Megan Shields was given information about Chronic Care Management services today including:  1. CCM service includes personalized support from designated clinical staff supervised by her physician, including individualized plan of care and coordination with other care providers 2. 24/7 contact phone numbers for assistance for urgent and routine care needs. 3. Service will only be billed when office clinical staff spend 20 minutes or more in a month to coordinate care. 4. Only one practitioner may furnish and bill the service in a calendar month. 5. The patient may stop CCM services at any time (effective at the end of the month) by phone call to the office staff. 6. The patient will be responsible for cost sharing (co-pay) of up to 20% of the service fee (after annual deductible is met).  Patient did not agree to enrollment in care management services and does not wish to consider at this time.  Follow up plan: The patient has been provided with contact information for the care management team and has been advised to call with any health related questions or concerns.   Noreene Larsson, [place], [place], [place] [address] Direct Dial: [phone] [email or web address]: [email or web address]

## 2019-11-22 ENCOUNTER — Ambulatory Visit (INDEPENDENT_AMBULATORY_CARE_PROVIDER_SITE_OTHER): Payer: Medicare Other

## 2019-11-22 DIAGNOSIS — Z0000 Encounter for general adult medical examination without abnormal findings: Secondary | ICD-10-CM

## 2019-11-22 NOTE — Patient Instructions (Signed)
Megan Shields , Thank you for taking time to come for your Medicare Wellness Visit. I appreciate your ongoing commitment to your health goals. Please review the following plan we discussed and let me know if I can assist you in the future.   Screening recommendations/referrals: Colonoscopy: done 03/07/17 Mammogram: done 09/01/19 Bone Density: declined Recommended yearly ophthalmology/optometry visit for glaucoma screening and checkup Recommended yearly dental visit for hygiene and checkup  Vaccinations: Influenza vaccine: declined Tdap vaccine: due Shingles vaccine: Shingrix discussed. Please contact your pharmacy for coverage information.  Covid-19:done 03/26/19 & 04/16/19  Advanced directives: Please bring a copy of your health care power of attorney and living will to the office at your convenience.  Conditions/risks identified: Recommend healthy eating and physical activity to lower A1c  Next appointment: Follow up in one year for your annual wellness visit    Preventive Care 65 Years and Older, Female Preventive care refers to lifestyle choices and visits with your health care provider that can promote health and wellness. What does preventive care include?  A yearly physical exam. This is also called an annual well check.  Dental exams once or twice a year.  Routine eye exams. Ask your health care provider how often you should have your eyes checked.  Personal lifestyle choices, including:  Daily care of your teeth and gums.  Regular physical activity.  Eating a healthy diet.  Avoiding tobacco and drug use.  Limiting alcohol use.  Practicing safe sex.  Taking low-dose aspirin every day.  Taking vitamin and mineral supplements as recommended by your health care provider. What happens during an annual well check? The services and screenings done by your health care provider during your annual well check will depend on your age, overall health, lifestyle risk factors,  and family history of disease. Counseling  Your health care provider may ask you questions about your:  Alcohol use.  Tobacco use.  Drug use.  Emotional well-being.  Home and relationship well-being.  Sexual activity.  Eating habits.  History of falls.  Memory and ability to understand (cognition).  Work and work Statistician.  Reproductive health. Screening  You may have the following tests or measurements:  Height, weight, and BMI.  Blood pressure.  Lipid and cholesterol levels. These may be checked every 5 years, or more frequently if you are over 44 years old.  Skin check.  Lung cancer screening. You may have this screening every year starting at age 18 if you have a 30-pack-year history of smoking and currently smoke or have quit within the past 15 years.  Fecal occult blood test (FOBT) of the stool. You may have this test every year starting at age 79.  Flexible sigmoidoscopy or colonoscopy. You may have a sigmoidoscopy every 5 years or a colonoscopy every 10 years starting at age 43.  Hepatitis C blood test.  Hepatitis B blood test.  Sexually transmitted disease (STD) testing.  Diabetes screening. This is done by checking your blood sugar (glucose) after you have not eaten for a while (fasting). You may have this done every 1-3 years.  Bone density scan. This is done to screen for osteoporosis. You may have this done starting at age 84.  Mammogram. This may be done every 1-2 years. Talk to your health care provider about how often you should have regular mammograms. Talk with your health care provider about your test results, treatment options, and if necessary, the need for more tests. Vaccines  Your health care provider may recommend  certain vaccines, such as:  Influenza vaccine. This is recommended every year.  Tetanus, diphtheria, and acellular pertussis (Tdap, Td) vaccine. You may need a Td booster every 10 years.  Zoster vaccine. You may need  this after age 69.  Pneumococcal 13-valent conjugate (PCV13) vaccine. One dose is recommended after age 24.  Pneumococcal polysaccharide (PPSV23) vaccine. One dose is recommended after age 55. Talk to your health care provider about which screenings and vaccines you need and how often you need them. This information is not intended to replace advice given to you by your health care provider. Make sure you discuss any questions you have with your health care provider. Document Released: 03/10/2015 Document Revised: 11/01/2015 Document Reviewed: 12/13/2014 Elsevier Interactive Patient Education  2017 [place] Prevention in the Home Falls can cause injuries. They can happen to people of all ages. There are many things you can do to make your home safe and to help prevent falls. What can I do on the outside of my home?  Regularly fix the edges of walkways and driveways and fix any cracks.  Remove anything that might make you trip as you walk through a door, such as a raised step or threshold.  Trim any bushes or trees on the path to your home.  Use bright outdoor lighting.  Clear any walking paths of anything that might make someone trip, such as rocks or tools.  Regularly check to see if handrails are loose or broken. Make sure that both sides of any steps have handrails.  Any raised decks and porches should have guardrails on the edges.  Have any leaves, snow, or ice cleared regularly.  Use sand or salt on walking paths during winter.  Clean up any spills in your garage right away. This includes oil or grease spills. What can I do in the bathroom?  Use night lights.  Install grab bars by the toilet and in the tub and shower. Do not use towel bars as grab bars.  Use non-skid mats or decals in the tub or shower.  If you need to sit down in the shower, use a plastic, non-slip stool.  Keep the floor dry. Clean up any water that spills on the floor as soon as it  happens.  Remove soap buildup in the tub or shower regularly.  Attach bath mats securely with double-sided non-slip rug tape.  Do not have throw rugs and other things on the floor that can make you trip. What can I do in the bedroom?  Use night lights.  Make sure that you have a light by your bed that is easy to reach.  Do not use any sheets or blankets that are too big for your bed. They should not hang down onto the floor.  Have a firm chair that has side arms. You can use this for support while you get dressed.  Do not have throw rugs and other things on the floor that can make you trip. What can I do in the kitchen?  Clean up any spills right away.  Avoid walking on wet floors.  Keep items that you use a lot in easy-to-reach places.  If you need to reach something above you, use a strong step stool that has a grab bar.  Keep electrical cords out of the way.  Do not use floor polish or wax that makes floors slippery. If you must use wax, use non-skid floor wax.  Do not have throw rugs and other  things on the floor that can make you trip. What can I do with my stairs?  Do not leave any items on the stairs.  Make sure that there are handrails on both sides of the stairs and use them. Fix handrails that are broken or loose. Make sure that handrails are as long as the stairways.  Check any carpeting to make sure that it is firmly attached to the stairs. Fix any carpet that is loose or worn.  Avoid having throw rugs at the top or bottom of the stairs. If you do have throw rugs, attach them to the floor with carpet tape.  Make sure that you have a light switch at the top of the stairs and the bottom of the stairs. If you do not have them, ask someone to add them for you. What else can I do to help prevent falls?  Wear shoes that:  Do not have high heels.  Have rubber bottoms.  Are comfortable and fit you well.  Are closed at the toe. Do not wear sandals.  If you  use a stepladder:  Make sure that it is fully opened. Do not climb a closed stepladder.  Make sure that both sides of the stepladder are locked into place.  Ask someone to hold it for you, if possible.  Clearly mark and make sure that you can see:  Any grab bars or handrails.  First and last steps.  Where the edge of each step is.  Use tools that help you move around (mobility aids) if they are needed. These include:  Canes.  Walkers.  Scooters.  Crutches.  Turn on the lights when you go into a dark area. Replace any light bulbs as soon as they burn out.  Set up your furniture so you have a clear path. Avoid moving your furniture around.  If any of your floors are uneven, fix them.  If there are any pets around you, be aware of where they are.  Review your medicines with your doctor. Some medicines can make you feel dizzy. This can increase your chance of falling. Ask your doctor what other things that you can do to help prevent falls. This information is not intended to replace advice given to you by your health care provider. Make sure you discuss any questions you have with your health care provider. Document Released: 12/08/2008 Document Revised: 07/20/2015 Document Reviewed: 03/18/2014 Elsevier Interactive Patient Education  2017 [employer].

## 2019-11-22 NOTE — Progress Notes (Signed)
Subjective:   Megan Shields is a 74 y.o. female who presents for Medicare Annual (Subsequent) preventive examination.  Virtual Visit via Telephone Note  I connected with  Megan Shields on 11/22/19 at  8:00 AM EDT by telephone and verified that I am speaking with the correct person using two identifiers.  Medicare Annual Wellness visit completed telephonically due to Covid-19 pandemic.   Location: Patient: home Provider: [facility]   I discussed the limitations, risks, security and privacy concerns of performing an evaluation and management service by telephone and the availability of in person appointments. The patient expressed understanding and agreed to proceed.  Unable to perform video visit due to video visit attempted and failed and/or patient does not have video capability.   Some vital signs may be absent or patient reported.   Clemetine Marker, LPN    Review of Systems     Cardiac Risk Factors include: advanced age (>35men, >73 women);diabetes mellitus;hypertension;obesity (BMI >30kg/m2)     Objective:    There were no vitals filed for this visit. There is no height or weight on file to calculate BMI.  Advanced Directives 11/22/2019 05/05/2018 03/07/2017 11/22/2016 11/22/2015 03/08/2015 08/10/2014  Does Patient Have a Medical Advance Directive? Yes Yes Yes Yes Yes Yes Yes  Type of Paramedic of [place];Living will [place];Living will Living will Living will [place];Living will [place];Living will [place];Living will  Copy of [place] in Chart? No - copy requested - - - Yes - -    Current Medications (verified) Outpatient Encounter Medications as of 11/22/2019  Medication Sig  . acetaminophen (TYLENOL) 500 MG tablet Take 1,000 mg by mouth at bedtime as needed.  . Blood Glucose Monitoring Suppl (FREESTYLE LITE) DEVI 1 each by Does not apply route 2  (two) times daily.  . celecoxib (CELEBREX) 200 MG capsule TAKE 1 CAPSULE DAILY  . cholecalciferol (VITAMIN D3) 25 MCG (1000 UNIT) tablet Take 1 tablet by mouth daily.  . cyclobenzaprine (FLEXERIL) 10 MG tablet Take 10 mg by mouth 3 (three) times daily as needed for muscle spasms.  Marland Kitchen glucose blood (FREESTYLE LITE) test strip TEST BLOOD SUGAR TWICE A DAY  . Lancets (FREESTYLE) lancets TEST BLOOD SUGAR TWICE A DAY  . metFORMIN (GLUCOPHAGE) 500 MG tablet TAKE 1 TABLET THREE TIMES A DAY AFTER MEALS (Patient taking differently: in the morning and at bedtime. )  . olmesartan-hydrochlorothiazide (BENICAR HCT) 40-25 MG tablet TAKE 1 TABLET DAILY   No facility-administered encounter medications on file as of 11/22/2019.    Allergies (verified) Pneumococcal vaccines, Codeine, and Amoxicillin   History: Past Medical History:  Diagnosis Date  . Arthritis   . Diabetes mellitus without complication ([place])   . Hypertension   . Obesity   . PONV (postoperative nausea and vomiting)   . Shoulder contusion 11/22/2015   Past Surgical History:  Procedure Laterality Date  . BLEPHAROPLASTY Bilateral 12/19/2017  . CARPAL TUNNEL RELEASE Bilateral 1985  . CHOLECYSTECTOMY    . COLONOSCOPY WITH PROPOFOL N/A 03/07/2017   Procedure: COLONOSCOPY WITH PROPOFOL;  Surgeon: [name], MD;  Location: [facility];  Service: Gastroenterology;  Laterality: N/A;  . DILATION AND CURETTAGE OF UTERUS  12/2011   benign endometrial polyp  . HALLUX VALGUS AUSTIN Right 09/02/2014   Procedure: [place];  Surgeon: Samara Deist, DPM;  Location: [facility] ORS;  Service: Podiatry;  Laterality: Right;  . HERNIA REPAIR    .  LAPAROSCOPIC GASTRIC BANDING  2010   Lap Band  . lymph node removed Right   . TOTAL KNEE ARTHROPLASTY Bilateral 2003, 2004  . TOTAL KNEE ARTHROPLASTY Bilateral   . VENTRAL HERNIA REPAIR  2008   Family History  Problem Relation Age of Onset  . Breast cancer Mother 72  . Breast cancer Paternal Aunt          3 pat aunts  . Liver disease Brother        died 30   Social History   Socioeconomic History  . Marital status: Married    Spouse name: Not on file  . Number of children: Not on file  . Years of education: Not on file  . Highest education level: Not on file  Occupational History  . Not on file  Tobacco Use  . Smoking status: Never Smoker  . Smokeless tobacco: Never Used  Vaping Use  . Vaping Use: Never used  Substance and Sexual Activity  . Alcohol use: No    Alcohol/week: 0.0 standard drinks  . Drug use: No  . Sexual activity: Not on file  Other Topics Concern  . Not on file  Social History Narrative  . Not on file   Social Determinants of Health   Financial Resource Strain: Low Risk   . Difficulty of Paying Living Expenses: Not hard at all  Food Insecurity: No Food Insecurity  . Worried About Charity fundraiser in the Last Year: Never true  . Ran Out of Food in the Last Year: Never true  Transportation Needs: No Transportation Needs  . Lack of Transportation (Medical): No  . Lack of Transportation (Non-Medical): No  Physical Activity: Insufficiently Active  . Days of Exercise per Week: 3 days  . Minutes of Exercise per Session: 30 min  Stress: No Stress Concern Present  . Feeling of Stress : Not at all  Social Connections: Moderately Isolated  . Frequency of Communication with Friends and Family: More than three times a week  . Frequency of Social Gatherings with Friends and Family: More than three times a week  . Attends Religious Services: Never  . Active Member of Clubs or Organizations: No  . Attends Archivist Meetings: Never  . Marital Status: Married    Tobacco Counseling Counseling given: Not Answered   Clinical Intake:  Pre-visit preparation completed: Yes  Pain : No/denies pain     Nutritional Risks: None Diabetes: Yes CBG done?: No Did pt. bring in CBG monitor from home?: No  How often do you need to have someone  help you when you read instructions, pamphlets, or other written materials from your doctor or pharmacy?: 1 - Never  Nutrition Risk Assessment:  Has the patient had any N/V/D within the last 2 months?  No  Does the patient have any non-healing wounds?  No  Has the patient had any unintentional weight loss or weight gain?  No   Diabetes:  Is the patient diabetic?  Yes  If diabetic, was a CBG obtained today?  No  Did the patient bring in their glucometer from home?  No  How often do you monitor your CBG's? daily.   Financial Strains and Diabetes Management:  Are you having any financial strains with the device, your supplies or your medication? No .  Does the patient want to be seen by Chronic Care Management for management of their diabetes?  No  Would the patient like to be referred to a Nutritionist or  for Diabetic Management?  No   Diabetic Exams:  Diabetic Eye Exam: Completed 08/02/19 negative retinopathy.   Diabetic Foot Exam: Completed 10/06/19.   Interpreter Needed?: No  Information entered by :: Clemetine Marker LPN   Activities of Daily Living In your present state of health, do you have any difficulty performing the following activities: 11/22/2019  Hearing? N  Comment declines hearing aids  Vision? N  Difficulty concentrating or making decisions? N  Walking or climbing stairs? N  Dressing or bathing? N  Doing errands, shopping? N  Preparing Food and eating ? N  Using the Toilet? N  In the past six months, have you accidently leaked urine? N  Do you have problems with loss of bowel control? N  Managing your Medications? N  Managing your Finances? N  Housekeeping or managing your Housekeeping? N  Some recent data might be hidden    Patient Care Team: Glean Hess, MD as PCP - General (Internal Medicine) [facility] (Ophthalmology) Jannet Mantis, MD (Dermatology)  Indicate any recent Medical Services you may have received from other than [facility]  providers in the past year (date may be approximate).     Assessment:   This is a routine wellness examination for Noam.  Hearing/Vision screen  Hearing Screening   125Hz  250Hz  500Hz  1000Hz  2000Hz  3000Hz  4000Hz  6000Hz  8000Hz   Right ear:           Left ear:           Comments: Pt denies hearing difficulty  Vision Screening Comments: [facility]  Dietary issues and exercise activities discussed: Current Exercise Habits: Home exercise routine, Type of exercise: walking, Time (Minutes): 30, Frequency (Times/Week): 3, Weekly Exercise (Minutes/Week): 90, Intensity: Mild, Exercise limited by: None identified  Goals    . Prevent Falls      Depression Screen PHQ 2/9 Scores 11/22/2019 04/08/2019 10/06/2018 04/17/2018 03/25/2018 09/23/2017 11/22/2016  PHQ - 2 Score 0 0 0 0 0 0 0  PHQ- 9 Score - 0 0 - - - -    Fall Risk Fall Risk  11/22/2019 10/06/2019 10/06/2018 04/17/2018 03/25/2018  Falls in the past year? 0 0 0 0 0  Comment - - - - -  Number falls in past yr: 0 - 0 0 0  Injury with Fall? 0 - 0 0 0  Risk for fall due to : No Fall Risks No Fall Risks - - -  Follow up Falls prevention discussed Falls evaluation completed Falls evaluation completed Falls evaluation completed Falls evaluation completed    Any stairs in or around the home? Yes  If so, are there any without handrails? No  Home free of loose throw rugs in walkways, pet beds, electrical cords, etc? Yes  Adequate lighting in your home to reduce risk of falls? Yes   ASSISTIVE DEVICES UTILIZED TO PREVENT FALLS:  Life alert? No  Use of a cane, walker or w/c? No  Grab bars in the bathroom? No  Shower chair or bench in shower? No  Elevated toilet seat or a handicapped toilet? Yes   TIMED UP AND GO:  Was the test performed? No . Telephonic visit.   Cognitive Function:     6CIT Screen 11/22/2019 10/06/2018 11/22/2016 11/22/2015  What Year? 0 points 0 points 0 points 0 points  What month? 0  points 0 points 0 points 0 points  What time? 0 points 0 points 0 points 0 points  Count back from  20 0 points 0 points 0 points 0 points  Months in reverse 0 points 0 points 2 points 0 points  Repeat phrase 0 points 4 points 2 points 0 points  Total Score 0 4 4 0    Immunizations Immunization History  Administered Date(s) Administered  . PFIZER SARS-COV-2 Vaccination 03/26/2019, 04/16/2019  . Pneumococcal Conjugate-13 03/11/2014  . Pneumococcal Polysaccharide-23 09/04/2012  . Zoster 03/30/2013    TDAP status: Due, Education has been provided regarding the importance of this vaccine. Advised may receive this vaccine at local pharmacy or Health Dept. Aware to provide a copy of the vaccination record if obtained from local pharmacy or Health Dept. Verbalized acceptance and understanding.   Flu Vaccine status: Declined, Education has been provided regarding the importance of this vaccine but patient still declined. Advised may receive this vaccine at local pharmacy or Health Dept. Aware to provide a copy of the vaccination record if obtained from local pharmacy or Health Dept. Verbalized acceptance and understanding.   Pneumococcal vaccine status: n/a due to allergy  Covid-19 vaccine status: Completed vaccines  Qualifies for Shingles Vaccine? Yes   Zostavax completed Yes   Shingrix Completed?: No.    Education has been provided regarding the importance of this vaccine. Patient has been advised to call insurance company to determine out of pocket expense if they have not yet received this vaccine. Advised may also receive vaccine at local pharmacy or Health Dept. Verbalized acceptance and understanding.  Screening Tests Health Maintenance  Topic Date Due  . DEXA SCAN  Never done  . INFLUENZA VACCINE  05/25/2020 (Originally 09/26/2019)  . TETANUS/TDAP  10/05/2020 (Originally 10[birth year]30/1966)  . HEMOGLOBIN A1C  04/07/2020  . OPHTHALMOLOGY EXAM  08/01/2020  . MAMMOGRAM  08/31/2020  . FOOT EXAM   10/05/2020  . COVID-19 Vaccine  Completed  . Hepatitis C Screening  Completed    Health Maintenance  Health Maintenance Due  Topic Date Due  . DEXA SCAN  Never done    Colorectal cancer screening: Completed 03/07/17. Repeat every 10 years   Mammogram status: Completed 09/01/19. Repeat every year   Bone Density Status: pt declined  Lung Cancer Screening: (Low Dose CT Chest recommended if Age 37-80 years, 30 pack-year currently smoking OR have quit w/in 15years.) does not qualify.    Additional Screening:  Hepatitis C Screening: does qualify; Completed 11/22/15  Vision Screening: Recommended annual ophthalmology exams for early detection of glaucoma and other disorders of the eye. Is the patient up to date with their annual eye exam?  Yes  Who is the provider or what is the name of the office in which the patient attends annual eye exams? [place] Screening: Recommended annual dental exams for proper oral hygiene  Community Resource Referral / Chronic Care Management: CRR required this visit?  No   CCM required this visit?  No      Plan:     I have personally reviewed and noted the following in the patient's chart:   . Medical and social history . Use of alcohol, tobacco or illicit drugs  . Current medications and supplements . Functional ability and status . Nutritional status . Physical activity . Advanced directives . List of other physicians . Hospitalizations, surgeries, and ER visits in previous 12 months . Vitals . Screenings to include cognitive, depression, and falls . Referrals and appointments  In addition, I have reviewed and discussed with patient certain preventive protocols, quality metrics, and best practice recommendations. A written  personalized care plan for preventive services as well as general preventive health recommendations were provided to patient.     Clemetine Marker, LPN   7/37/1062   Nurse Notes: none

## 2019-11-25 DIAGNOSIS — N8502 Endometrial intraepithelial neoplasia [EIN]: Secondary | ICD-10-CM | POA: Diagnosis not present

## 2019-11-30 ENCOUNTER — Encounter: Payer: Self-pay | Admitting: Internal Medicine

## 2019-11-30 ENCOUNTER — Other Ambulatory Visit: Payer: Self-pay | Admitting: Internal Medicine

## 2019-11-30 DIAGNOSIS — M170 Bilateral primary osteoarthritis of knee: Secondary | ICD-10-CM

## 2019-11-30 MED ORDER — CELECOXIB 200 MG PO CAPS: 200.0000 mg | ORAL_CAPSULE | Freq: Every day | ORAL | 1 refills | Status: DC

## 2019-12-20 DIAGNOSIS — N8502 Endometrial intraepithelial neoplasia [EIN]: Secondary | ICD-10-CM | POA: Diagnosis not present

## 2019-12-23 DIAGNOSIS — Z7984 Long term (current) use of oral hypoglycemic drugs: Secondary | ICD-10-CM | POA: Diagnosis not present

## 2019-12-23 DIAGNOSIS — Z803 Family history of malignant neoplasm of breast: Secondary | ICD-10-CM | POA: Diagnosis not present

## 2019-12-23 DIAGNOSIS — N83201 Unspecified ovarian cyst, right side: Secondary | ICD-10-CM | POA: Diagnosis not present

## 2019-12-23 DIAGNOSIS — I10 Essential (primary) hypertension: Secondary | ICD-10-CM | POA: Diagnosis not present

## 2019-12-23 DIAGNOSIS — N83202 Unspecified ovarian cyst, left side: Secondary | ICD-10-CM | POA: Diagnosis not present

## 2019-12-23 DIAGNOSIS — N8502 Endometrial intraepithelial neoplasia [EIN]: Secondary | ICD-10-CM | POA: Diagnosis not present

## 2019-12-23 DIAGNOSIS — D360 Benign neoplasm of lymph nodes: Secondary | ICD-10-CM | POA: Diagnosis not present

## 2019-12-23 DIAGNOSIS — N800 Endometriosis of uterus: Secondary | ICD-10-CM | POA: Diagnosis not present

## 2019-12-23 DIAGNOSIS — N838 Other noninflammatory disorders of ovary, fallopian tube and broad ligament: Secondary | ICD-10-CM | POA: Diagnosis not present

## 2019-12-23 DIAGNOSIS — E119 Type 2 diabetes mellitus without complications: Secondary | ICD-10-CM | POA: Diagnosis not present

## 2019-12-23 DIAGNOSIS — Z9884 Bariatric surgery status: Secondary | ICD-10-CM | POA: Diagnosis not present

## 2019-12-23 DIAGNOSIS — Z6841 Body Mass Index (BMI) 40.0 and over, adult: Secondary | ICD-10-CM | POA: Diagnosis not present

## 2019-12-23 DIAGNOSIS — Z79899 Other long term (current) drug therapy: Secondary | ICD-10-CM | POA: Diagnosis not present

## 2019-12-23 HISTORY — PX: LAPAROSCOPIC TOTAL HYSTERECTOMY: SUR800

## 2019-12-24 ENCOUNTER — Encounter: Payer: Self-pay | Admitting: Internal Medicine

## 2020-01-03 DIAGNOSIS — R42 Dizziness and giddiness: Secondary | ICD-10-CM | POA: Diagnosis not present

## 2020-01-03 DIAGNOSIS — Z09 Encounter for follow-up examination after completed treatment for conditions other than malignant neoplasm: Secondary | ICD-10-CM | POA: Diagnosis not present

## 2020-01-03 DIAGNOSIS — N8502 Endometrial intraepithelial neoplasia [EIN]: Secondary | ICD-10-CM | POA: Diagnosis not present

## 2020-01-11 ENCOUNTER — Ambulatory Visit: Payer: Medicare Other | Attending: Obstetrics and Gynecology

## 2020-01-11 ENCOUNTER — Other Ambulatory Visit: Payer: Self-pay

## 2020-01-11 DIAGNOSIS — R42 Dizziness and giddiness: Secondary | ICD-10-CM | POA: Diagnosis not present

## 2020-01-11 NOTE — Patient Instructions (Signed)
Access Code: [email or web address] URL: [email or web address] Date: 01/11/2020 Prepared by: Roxana Hires  Exercises Self-Epley Maneuver Right Ear - 1 x daily - 7 x weekly - 3 reps - 60s in each position hold  Patient Education BPPV

## 2020-01-11 NOTE — Therapy (Signed)
[place] MAIN [facility] SERVICES [place], [place], [address] Phone: [phone]   Fax:  [phone]  Physical Therapy Evaluation  Patient Details  Name: Megan Shields MRN: [id number] Date of Birth: 1945-06-13 Referring Provider (PT): Dr. Neomia Glass   Encounter Date: 01/11/2020   PT End of Session - 01/11/20 1325    Visit Number 1    Number of Visits 17    Date for PT Re-Evaluation 03/07/20    Authorization Type eval: 01/11/20    PT Start Time 1300    PT Stop Time 1400    PT Time Calculation (min) 60 min    Equipment Utilized During Treatment Gait belt    Activity Tolerance Patient tolerated treatment well    Behavior During Therapy [facility] for tasks assessed/performed           Past Medical History:  Diagnosis Date   Arthritis    Diabetes mellitus without complication (HCC)    Hypertension    Obesity    PONV (postoperative nausea and vomiting)    Shoulder contusion 11/22/2015    Past Surgical History:  Procedure Laterality Date   BLEPHAROPLASTY Bilateral 12/19/2017   CARPAL TUNNEL RELEASE Bilateral 1985   CHOLECYSTECTOMY     COLONOSCOPY WITH PROPOFOL N/A 03/07/2017   Procedure: COLONOSCOPY WITH PROPOFOL;  Surgeon: [name], MD;  Location: [facility] ENDOSCOPY;  Service: Gastroenterology;  Laterality: N/A;   DILATION AND CURETTAGE OF UTERUS  12/2011   benign endometrial polyp   HALLUX VALGUS [place]USTIN Right 09/02/2014   Procedure: [place];  Surgeon: Samara Deist, DPM;  Location: [facility] ORS;  Service: Podiatry;  Laterality: Right;   HERNIA REPAIR     LAPAROSCOPIC GASTRIC BANDING  2010   Lap Band   LAPAROSCOPIC TOTAL HYSTERECTOMY  12/23/2019   lymph node removed Right    TOTAL KNEE ARTHROPLASTY Bilateral 2003, 2004   TOTAL KNEE ARTHROPLASTY Bilateral    VENTRAL HERNIA REPAIR  2008    There were no vitals filed for this visit.    Subjective Assessment - 01/11/20 1317    Subjective Vertigo     Pertinent History Pt reports that she had a robotic hysterectomy 3 weeks ago and when she woke up in the recovery room she had vertigo. She reports that during the surgery they had to tilt her back and she thinks that's why she started having the vertigo again. She initially had nausea with the vertigo but that has improved. Since that time vertigo has continued whenever she bends over, looks up, or rolls over in bed. 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 Prior to coming to OP PT in 2020 pt was seen at [place] due to her dizziness. She had a cardiac ultrasound, brain MRI and heart MRI and patient findings were negative. She has a remote history of bradycardia but was cleared by cardiology.    Diagnostic tests See history    Patient Stated Goals Improve vertigo    Currently in Pain? No/denies                  [facility] PT Assessment - 01/11/20 1320      Assessment   Medical Diagnosis Vertigo    Referring Provider (PT) Dr. Neomia Glass    Onset Date/Surgical Date 12/23/19    Hand Dominance Right    Next MD Visit January with PCP    Prior Therapy Yes, previously treated at this clinic for BPPV  Restrictions   Weight Bearing Restrictions No      Balance Screen   Has the patient fallen in the past 6 months No    Has the patient had a decrease in activity level because of a fear of falling?  No    Is the patient reluctant to leave their home because of a fear of falling?  No      Home Ecologist residence    Living Arrangements Spouse/significant other    Available Help at Discharge Family;Friend(s)    Type of Home Other(Comment)   Currently staying in RV while new house is being [place] to enter    Entrance Stairs-Number of Steps 3    [place] One level      Prior Function   Level of Independence Independent    Vocation Other (comment)    Leisure Spending time with  family      Cognition   Overall Cognitive Status Within Functional Limits for tasks assessed             VESTIBULAR AND BALANCE EVALUATION   HISTORY:  Subjective history of current problem: Pt reports that she had a robotic hysterectomy 3 weeks ago (12/23/19) and when she woke up in the recovery room she had vertigo. She reports that during the surgery they had to tilt her back and she thinks that's why she started having the vertigo again. She initially had nausea with the vertigo but that has improved. Since that time vertigo has continued whenever she bends over, looks up, or rolls over in bed. 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 Prior to coming to OP PT in 2020 pt was seen at [place] due to her dizziness. She had a cardiac ultrasound, brain MRI and heart MRI and patient findings were negative. She has a remote history of bradycardia but was cleared by cardiology.   Description of dizziness: vertigo (vertigo, unsteadiness, lightheadedness, falling, general unsteadiness, whoozy, swimmy-headed sensation, aural fullness) Frequency: in aggravating positions Duration: 30s Symptom nature: (motion provoked, positional, spontaneous, constant, variable, intermittent)   Provocative Factors: Bending forward, tipping head back, standing up too quickly, rolling onto her side Easing Factors: waiting for symptoms to pass  Progression of symptoms: (better, worse, no change since onset) unchanged History of similar episodes: Yes, previously treated for BPPV in 04/2018  Falls (yes/no): No  Prior Functional Level: Fully independent  Auditory complaints (tinnitus, pain, drainage, hearing loss, aural fullness): None Vision (diplopia, visual field loss, recent changes, last eye exam): None Chest pain/palpitations: None Migraines/headaches: None  Red Flags: (dysarthria, dysphagia, drop attacks, bowel and bladder changes, recent weight loss/gain) Review of  systems negative for red flags.     EXAMINATION  POSTURE: WNL  NEUROLOGICAL SCREEN: (2+ unless otherwise noted.) N=normal  Ab=abnormal  Level Dermatome R L Myotome R L Reflex R L  C3 Anterior Neck N N Sidebend C2-3 N N Jaw CN V    C4 Top of Shoulder N N Shoulder Shrug C4 N N Hoffmans UMN    C5 Lateral Upper Arm N N Shoulder ABD C4-5 N N Biceps C5-6    C6 Lateral Arm/ Thumb N N Arm Flex/ Wrist Ext C5-6 N N Brachiorad. C5-6    C7 Middle Finger N N Arm Ext//Wrist Flex C6-7 N N Triceps C7    C8 4th & 5th Finger N N Flex/ Ext  Carpi Ulnaris C8 N N Patellar (L3-4)    T1 Medial Arm N N Interossei T1 N N Gastrocnemius    L2 Medial thigh/groin N N Illiopsoas (L2-3) N N     L3 Lower thigh/med.knee N N Quadriceps (L3-4) N N     L4 Medial leg/lat thigh N N Tibialis Ant (L4-5) N N     L5 Lat. leg & dorsal foot N N EHL (L5) N N     S1 post/lat foot/thigh/leg N N Gastrocnemius (S1-2) N N     S2 Post./med. thigh & leg N N Hamstrings (L4-S3) N N       Cranial Nerves Deferred   SOMATOSENSORY:         Sensation           Intact      Diminished         Absent  Light touch Normal      COORDINATION: Deferred   MUSCULOSKELETAL SCREEN: Cervical Spine ROM: WFL and painless in all planes. No gross deficits identified   ROM: WNL  MMT: WNL BUE/BLE  Functional Mobility: Independent for transfers and ambulation without assistive device   Gait: No gross deficits observed   POSTURAL CONTROL TESTS:   Clinical Test of Sensory Interaction for Balance    (CTSIB): Deferred   OCULOMOTOR / VESTIBULAR TESTING: Deferred   BPPV TESTS:  Symptoms Duration Intensity Nystagmus  L Dix-Hallpike None   Mild downbeating L torsional   R Dix-Hallpike Vertigo 15s 8-9/10 Upbeating R torsional   L Head Roll None   None  R Head Roll None   None  L Sidelying Test      R Sidelying Test        FUNCTIONAL OUTCOME MEASURES   Results Comments  FOTO 51 75  ABC Scale 99.4% WNL  DHI 28/100 WNL         Objective measurements completed on examination: See above findings.        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 2 minute holds in each position and retesting between maneuvers. After first maneuver pt presents with only a very few faint upbeating R torsional beats of nystagmus and she denies any vertigo. After second maneuver pt has 1-2 beats of upbeating R torsional nystagmus with no vertigo. Pt issued information about BPPV as well as instructions about how to perform R Epley maneuver at home.        PT Education - 01/11/20 1354    Education Details Plan of care, BPPV, Home Epley    Person(s) Educated Patient    Methods Explanation;Demonstration;Handout    Comprehension Verbalized understanding            PT Short Term Goals - 01/11/20 1334      PT SHORT TERM GOAL #1   Title Patient will report 50% or greater improvement in her symptoms of vertigo within 4 weeks in order to help improve symptom-free function at home    Time 4    Period Weeks    Status New    Target Date 02/08/20      PT SHORT TERM GOAL #2   Title Pt will be independent with HEP in order to resolved vertigo and improve symptom-free function at home.    Time 4    Period Weeks    Status New    Target Date 02/08/20  PT Long Term Goals - 01/11/20 1334      PT LONG TERM GOAL #1   Title Patient will report 80% or more improvement in patient's subjective symptoms of vertigo in order to have patient be able to return to her prior activities without symptoms.    Time 8    Period Weeks    Status New    Target Date 03/07/20      PT LONG TERM GOAL #2   Title Pt will improve FOTO score to at least 75 in order to demonstrate significant improvement in her function at home.    Baseline 01/11/20: 51    Time 8    Period Weeks    Status New    Target Date 03/07/20      PT LONG TERM GOAL #3   Title Pt  will decrease DHI score by at least 18 points in order to demonstrate clinically significant reduction in disability related to her vertigo    Baseline 01/11/20: 28/100    Time 8    Period Weeks    Status New    Target Date 03/07/20                  Plan - 01/11/20 1326    Clinical Impression Statement Pt is a pleasant 74 year-old female referred for vertigo. PT examination reveals positive R Dix-Hallpike test consistent with R posterior canal BPPV. Pt treated with Epley maneuver today and provided instructions about how to perform at home. She will follow-up for additional testing and treatment. Pt will benefit from skilled PT services to address deficits in vertigo in order to improve symptom-free function at home.    Personal Factors and Comorbidities Age;Comorbidity 3+    Comorbidities DM, HTN, arthritis, recent hysterectomy    Examination-Activity Limitations [place]    Stability/Clinical Decision Making Unstable/Unpredictable    Clinical Decision Making Low    Rehab Potential Excellent    PT Frequency 2x / week    PT Duration 8 weeks    PT Treatment/Interventions Vestibular;Canalith Repostioning;Gait training;Stair training;Therapeutic exercise;Therapeutic activities;Neuromuscular re-education;Patient/family education;Aquatic Therapy;Biofeedback;Electrical Stimulation;Cryotherapy;Iontophoresis 4mg /ml Dexamethasone;Moist Heat;Traction;Ultrasound;Manual techniques    PT Next Visit Plan repeat dix-hallpike testing and CRT if indicated    PT Home Exercise Plan Access Code: [email or web address]    Consulted and Agree with Plan of Care Patient           Patient will benefit from skilled therapeutic intervention in order to improve the following deficits and impairments:  Dizziness  Visit Diagnosis: Dizziness and giddiness - Plan: PT plan of care cert/re-cert     Problem List Patient Active Problem List   Diagnosis Date Noted   Vitamin D deficiency 10/06/2019   Rosacea 04/08/2019    Hyperlipidemia associated with type 2 diabetes mellitus ([place]) 04/08/2019   Vertigo 04/07/2018   Tubular adenoma of colon 03/10/2017   Tinea pedis of left foot 11/22/2016   Type II diabetes mellitus with complication ([place]) 03/47/4259   Essential (primary) hypertension 08/01/2014   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   Arthritis of knee, degenerative 08/01/2014   Low back strain 08/01/2014   Lyndel Safe Melven Stockard PT, DPT, GCS  Edna Rede 01/11/2020, 2:16 PM  [place] MAIN [facility] SERVICES [address] [place], [place], [address] Phone: [phone]   Fax:  [phone]  Name: Megan Shields MRN: [id number] Date of Birth: 06-Apr-1945

## 2020-01-17 ENCOUNTER — Ambulatory Visit: Payer: Medicare Other

## 2020-01-17 ENCOUNTER — Other Ambulatory Visit: Payer: Self-pay

## 2020-01-17 DIAGNOSIS — R42 Dizziness and giddiness: Secondary | ICD-10-CM | POA: Diagnosis not present

## 2020-01-17 NOTE — Therapy (Signed)
[place] [facility] [address] [place], [place], [address] Phone: [phone]   Fax:  [phone]  Physical Therapy Treatment  Patient Details  Name: Megan Shields MRN: [id number] Date of Birth: [birth year] Referring Provider (PT): Dr. Neomia Glass   Encounter Date: 01/17/2020   PT End of Session - 01/17/20 0935    Visit Number 2    Number of Visits 17    Date for PT Re-Evaluation 03/07/20    Authorization Type eval: 01/11/20    PT Start Time 0933    PT Stop Time 1015    PT Time Calculation (min) 42 min    Equipment Utilized During Treatment Gait belt    Activity Tolerance Patient tolerated treatment well    Behavior During Therapy [facility] for tasks assessed/performed           Past Medical History:  Diagnosis Date  . Arthritis   . Diabetes mellitus without complication ([place])   . Hypertension   . Obesity   . PONV (postoperative nausea and vomiting)   . Shoulder contusion 11/22/2015    Past Surgical History:  Procedure Laterality Date  . BLEPHAROPLASTY Bilateral 12/19/2017  . CARPAL TUNNEL RELEASE Bilateral 1985  . CHOLECYSTECTOMY    . COLONOSCOPY WITH PROPOFOL N/A 03/07/2017   Procedure: COLONOSCOPY WITH PROPOFOL;  Surgeon: [name], MD;  Location: [facility] ENDOSCOPY;  Service: Gastroenterology;  Laterality: N/A;  . DILATION AND CURETTAGE OF UTERUS  12/2011   benign endometrial polyp  . HALLUX VALGUS [place]USTIN Right 09/02/2014   Procedure: [place];  Surgeon: Samara Deist, DPM;  Location: [facility] ORS;  Service: Podiatry;  Laterality: Right;  . HERNIA REPAIR    . LAPAROSCOPIC GASTRIC BANDING  2010   Lap Band  . LAPAROSCOPIC TOTAL HYSTERECTOMY  12/23/2019  . lymph node removed Right   . TOTAL KNEE ARTHROPLASTY Bilateral 2003, 2004  . TOTAL KNEE ARTHROPLASTY Bilateral   . VENTRAL HERNIA REPAIR  2008    There were no vitals filed for this visit.   Subjective Assessment - 01/17/20 0932    Subjective Pt reports that she has  continued with vertigo since her initial evaluation. She notices it the most when she lays back in bed or in her recliner. She was unable to perform the home Epley maneuver due to constraints related to the space of the RV they are currently staying in while their house is being built.    Pertinent History Pt reports that she had a robotic hysterectomy 3 weeks ago and when she woke up in the recovery room she had vertigo. She reports that during the surgery they had to tilt her back and she thinks that's why she started having the vertigo again. She initially had nausea with the vertigo but that has improved. Since that time vertigo has continued whenever she bends over, looks up, or rolls over in bed. 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 Prior to coming to OP PT in 2020 pt was seen at [place] due to her dizziness. She had a cardiac ultrasound, brain MRI and heart MRI and patient findings were negative. She has a remote history of bradycardia but was cleared by cardiology.    Diagnostic tests See history    Patient Stated Goals Improve vertigo    Currently in Pain? No/denies              TREATMENT  Canalith Repositioning Treatment Dix-Hallpike testing negative on the L  side for vertigo however observed downbeating L torsional nystagmus. R Dix-Hallpike is positive for upbeating R torsional nystagmus which lasts approximately 20s. Pt reports concurrent vertigo of 8-9/10 intensity. 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 2 minute holds in each position and retesting between maneuvers. After first maneuver pt is negative for either vertigo or nystagmus. Pt reports a lot of back discomfort during positioning despite use of pillow under low back and bolster under knees. She defers additional Epley or Liberatory maneuver on this date. Will retest and treat as appropriate at follow-up appointment until fully clear.                            PT Short Term Goals - 01/11/20 1334      PT SHORT TERM GOAL #1   Title Patient will report 50% or greater improvement in her symptoms of vertigo within 4 weeks in order to help improve symptom-free function at home    Time 4    Period Weeks    Status New    Target Date 02/08/20      PT SHORT TERM GOAL #2   Title Pt will be independent with HEP in order to resolved vertigo and improve symptom-free function at home.    Time 4    Period Weeks    Status New    Target Date 02/08/20             PT Long Term Goals - 01/11/20 1334      PT LONG TERM GOAL #1   Title Patient will report 80% or more improvement in patient's subjective symptoms of vertigo in order to have patient be able to return to her prior activities without symptoms.    Time 8    Period Weeks    Status New    Target Date 03/07/20      PT LONG TERM GOAL #2   Title Pt will improve FOTO score to at least 75 in order to demonstrate significant improvement in her function at home.    Baseline 01/11/20: 51    Time 8    Period Weeks    Status New    Target Date 03/07/20      PT LONG TERM GOAL #3   Title Pt will decrease DHI score by at least 18 points in order to demonstrate clinically significant reduction in disability related to her vertigo    Baseline 01/11/20: 28/100    Time 8    Period Weeks    Status New    Target Date 03/07/20                 Plan - 01/17/20 0935    Clinical Impression Statement Dix-Hallpike testing negative on the L side for vertigo however observed downbeating L torsional nystagmus. R Dix-Hallpike is positive for upbeating R torsional nystagmus which lasts approximately 20s. Pt reports concurrent vertigo of 8-9/10 intensity. 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 2 minute holds in each position and retesting between maneuvers. After first maneuver pt is negative for either vertigo or  nystagmus. Pt reports a lot of back discomfort during positioning despite use of pillow under low back and bolster under knees. She defers additional Epley or Liberatory maneuver on this date. Will retest and treat as appropriate at follow-up appointment until fully clear.    Personal Factors and Comorbidities Age;Comorbidity  3+    Comorbidities DM, HTN, arthritis, recent hysterectomy    Examination-Activity Limitations [place]    Stability/Clinical Decision Making Unstable/Unpredictable    Rehab Potential Excellent    PT Frequency 2x / week    PT Duration 8 weeks    PT Treatment/Interventions Vestibular;Canalith Repostioning;Gait training;Stair training;Therapeutic exercise;Therapeutic activities;Neuromuscular re-education;Patient/family education;Aquatic Therapy;Biofeedback;Electrical Stimulation;Cryotherapy;Iontophoresis 4mg /ml Dexamethasone;Moist Heat;Traction;Ultrasound;Manual techniques    PT Next Visit Plan repeat dix-hallpike testing and CRT if indicated    PT Home Exercise Plan Access Code: [email or web address]    Consulted and Agree with Plan of Care Patient           Patient will benefit from skilled therapeutic intervention in order to improve the following deficits and impairments:  Dizziness  Visit Diagnosis: Dizziness and giddiness     Problem List Patient Active Problem List   Diagnosis Date Noted  . Vitamin D deficiency 10/06/2019  . Rosacea 04/08/2019  . Hyperlipidemia associated with type 2 diabetes mellitus ([place]) 04/08/2019  . Vertigo 04/07/2018  . Tubular adenoma of colon 03/10/2017  . Tinea pedis of left foot 11/22/2016  . Type II diabetes mellitus with complication ([place]) 67/61/9509  . Essential (primary) hypertension 08/01/2014  . 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 Arthritis of knee, degenerative 08/01/2014  . Low back strain 08/01/2014   Lyndel Safe Starr Engel PT, DPT, GCS  Megan Shields 01/17/2020, 11:06 AM  [place] [facility] [address]. [place], [place], [address] Phone: [phone]   Fax:  [phone]  Name: Megan Shields MRN: [id number] Date of Birth: 1946-02-06

## 2020-01-24 ENCOUNTER — Ambulatory Visit: Payer: Medicare Other

## 2020-01-24 ENCOUNTER — Other Ambulatory Visit: Payer: Self-pay

## 2020-01-24 ENCOUNTER — Ambulatory Visit: Payer: Medicare Other | Admitting: Internal Medicine

## 2020-01-24 DIAGNOSIS — R42 Dizziness and giddiness: Secondary | ICD-10-CM | POA: Diagnosis not present

## 2020-01-24 NOTE — Therapy (Signed)
[facility] [address] [place], [place], [address] Phone: [phone]   Fax:  [phone]  Physical Therapy Treatment/Discharge  Patient Details  Name: Megan Shields MRN: [id number] Date of Birth: Nov 26, 1945 Referring Provider (PT): Dr. Neomia Glass   Encounter Date: 01/24/2020   PT End of Session - 01/24/20 1044    Visit Number 3    Number of Visits 17    Date for PT Re-Evaluation 03/07/20    Authorization Type eval: 01/11/20    PT Start Time 1017    PT Stop Time 1030    PT Time Calculation (min) 13 min    Equipment Utilized During Treatment Gait belt    Activity Tolerance Patient tolerated treatment well    Behavior During Therapy [facility] for tasks assessed/performed           Past Medical History:  Diagnosis Date   Arthritis    Diabetes mellitus without complication (HCC)    Hypertension    Obesity    PONV (postoperative nausea and vomiting)    Shoulder contusion 11/22/2015    Past Surgical History:  Procedure Laterality Date   BLEPHAROPLASTY Bilateral 12/19/2017   CARPAL TUNNEL RELEASE Bilateral 1985   CHOLECYSTECTOMY     COLONOSCOPY WITH PROPOFOL N/A 03/07/2017   Procedure: COLONOSCOPY WITH PROPOFOL;  Surgeon: [name], MD;  Location: [facility] ENDOSCOPY;  Service: Gastroenterology;  Laterality: N/A;   DILATION AND CURETTAGE OF UTERUS  12/2011   benign endometrial polyp   HALLUX VALGUS [place]USTIN Right 09/02/2014   Procedure: [place];  Surgeon: Samara Deist, DPM;  Location: [facility] ORS;  Service: Podiatry;  Laterality: Right;   HERNIA REPAIR     LAPAROSCOPIC GASTRIC BANDING  2010   Lap Band   LAPAROSCOPIC TOTAL HYSTERECTOMY  12/23/2019   lymph node removed Right    TOTAL KNEE ARTHROPLASTY Bilateral 2003, 2004   TOTAL KNEE ARTHROPLASTY Bilateral    VENTRAL HERNIA REPAIR  2008    There were no vitals filed for this visit.   Subjective Assessment - 01/24/20 1029    Subjective Pt reports  that she has not had any dizziness or vertigo since her last therapy session. She believes that her symptoms are 100% resolved. No specific questions or concerns upon arrival.    Pertinent History Pt reports that she had a robotic hysterectomy 3 weeks ago and when she woke up in the recovery room she had vertigo. She reports that during the surgery they had to tilt her back and she thinks that's why she started having the vertigo again. She initially had nausea with the vertigo but that has improved. Since that time vertigo has continued whenever she bends over, looks up, or rolls over in bed. 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 Prior to coming to OP PT in 2020 pt was seen at [facility] due to her dizziness. She had a cardiac ultrasound, brain MRI and heart MRI and patient findings were negative. She has a remote history of bradycardia but was cleared by cardiology.    Diagnostic tests See history    Patient Stated Goals Improve vertigo    Currently in Pain? No/denies              TREATMENT   Neuromuscular Re-education  Performed Dix-Hallpike and roll tests which are negative bilaterally; Pt completed FOTO and [place]; Reviewed discharge with instructions provided regarding indications for return.    Patient reports 100% resolution of  her vertigo since last therapy session.  Performed Dix-Hallpike and roll test which are negative bilaterally today.  Patient completed FOTO and [place].  Goals updated with patient and discharge instructions provided.  At this time patient has achieved full resolution of her BPPV and is ready for discharge.  Patient advised to return for additional therapy if symptoms return.                           PT Short Term Goals - 01/24/20 1023      PT SHORT TERM GOAL #1   Title Patient will report 50% or greater improvement in her symptoms of vertigo within 4 weeks in order to help improve symptom-free function at  home    Baseline 01/24/20: 100% resolution    Time 4    Period Weeks    Status Achieved    Target Date --      PT SHORT TERM GOAL #2   Title Pt will be independent with HEP in order to resolved vertigo and improve symptom-free function at home.    Time 4    Period Weeks    Status Achieved    Target Date --             PT Long Term Goals - 01/24/20 1023      PT LONG TERM GOAL #1   Title Patient will report 80% or more improvement in patient's subjective symptoms of vertigo in order to have patient be able to return to her prior activities without symptoms.    Baseline 01/24/20: 100% improvement    Time 8    Period Weeks    Status Achieved      PT LONG TERM GOAL #2   Title Pt will improve FOTO score to at least 75 in order to demonstrate significant improvement in her function at home.    Baseline 01/11/20: 51; 01/24/20: 97    Time 8    Period Weeks    Status Achieved      PT LONG TERM GOAL #3   Title Pt will decrease DHI score by at least 18 points in order to demonstrate clinically significant reduction in disability related to her vertigo    Baseline 01/11/20: 28/100; 01/24/20: 0/100    Time 8    Period Weeks    Status Achieved                 Plan - 01/24/20 1029    Clinical Impression Statement Patient reports 100% resolution of her vertigo since last therapy session.  Performed Dix-Hallpike and roll test which are negative bilaterally today.  Patient completed FOTO and [place].  Goals updated with patient and discharge instructions provided.  At this time patient has achieved full resolution of her BPPV and is ready for discharge.  Patient advised to return for additional therapy if symptoms return.    Personal Factors and Comorbidities Age;Comorbidity 3+    Comorbidities DM, HTN, arthritis, recent hysterectomy    Examination-Activity Limitations [place]    Stability/Clinical Decision Making Unstable/Unpredictable    Rehab Potential Excellent    PT Frequency 2x  / week    PT Duration 8 weeks    PT Treatment/Interventions Vestibular;Canalith Repostioning;Gait training;Stair training;Therapeutic exercise;Therapeutic activities;Neuromuscular re-education;Patient/family education;Aquatic Therapy;Biofeedback;Electrical Stimulation;Cryotherapy;Iontophoresis 4mg /ml Dexamethasone;Moist Heat;Traction;Ultrasound;Manual techniques    PT Next Visit Plan Discharged    PT Home Exercise Plan Access Code: [email or web address]    Consulted and Agree with Plan of Care Patient  Patient will benefit from skilled therapeutic intervention in order to improve the following deficits and impairments:  Dizziness  Visit Diagnosis: Dizziness and giddiness     Problem List Patient Active Problem List   Diagnosis Date Noted   Vitamin D deficiency 10/06/2019   Rosacea 04/08/2019   Hyperlipidemia associated with type 2 diabetes mellitus ([place]) 04/08/2019   Vertigo 04/07/2018   Tubular adenoma of colon 03/10/2017   Tinea pedis of left foot 11/22/2016   Type II diabetes mellitus with complication ([place]) 74/93/5521   Essential (primary) hypertension 08/01/2014   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   Arthritis of knee, degenerative 08/01/2014   Low back strain 08/01/2014   Lyndel Safe Goble Fudala PT, DPT, GCS  Tasheena Wambolt 01/24/2020, 10:50 AM  [facility] [address] [place], [place], [address] Phone: [phone]   Fax:  [phone]  Name: Megan Shields MRN: [id number] Date of Birth: Aug 19, 1945

## 2020-01-25 ENCOUNTER — Encounter: Payer: Medicare Other | Admitting: Physical Therapy

## 2020-01-31 ENCOUNTER — Ambulatory Visit: Payer: Medicare Other

## 2020-02-03 ENCOUNTER — Ambulatory Visit: Payer: Medicare Other

## 2020-02-03 ENCOUNTER — Encounter: Payer: Medicare Other | Admitting: Physical Therapy

## 2020-02-08 ENCOUNTER — Encounter: Payer: Medicare Other | Admitting: Physical Therapy

## 2020-02-15 ENCOUNTER — Other Ambulatory Visit: Payer: Self-pay | Admitting: Internal Medicine

## 2020-02-17 ENCOUNTER — Encounter: Payer: Medicare Other | Admitting: Physical Therapy

## 2020-02-24 ENCOUNTER — Encounter: Payer: Medicare Other | Admitting: Physical Therapy

## 2020-02-26 DIAGNOSIS — U071 COVID-19: Secondary | ICD-10-CM

## 2020-02-26 DIAGNOSIS — J1282 Pneumonia due to coronavirus disease 2019: Secondary | ICD-10-CM

## 2020-02-26 HISTORY — DX: COVID-19: U07.1

## 2020-02-26 HISTORY — DX: Pneumonia due to coronavirus disease 2019: J12.82

## 2020-02-29 ENCOUNTER — Encounter: Payer: Medicare Other | Admitting: Physical Therapy

## 2020-03-07 ENCOUNTER — Other Ambulatory Visit: Payer: Self-pay

## 2020-03-07 ENCOUNTER — Other Ambulatory Visit: Payer: Self-pay | Admitting: Internal Medicine

## 2020-03-07 ENCOUNTER — Encounter: Payer: Self-pay | Admitting: Internal Medicine

## 2020-03-07 ENCOUNTER — Ambulatory Visit (INDEPENDENT_AMBULATORY_CARE_PROVIDER_SITE_OTHER): Payer: Medicare Other | Admitting: Internal Medicine

## 2020-03-07 VITALS — BP 132/76 | HR 73 | Temp 97.7°F | Ht 64.0 in | Wt 291.0 lb

## 2020-03-07 DIAGNOSIS — E559 Vitamin D deficiency, unspecified: Secondary | ICD-10-CM

## 2020-03-07 DIAGNOSIS — I10 Essential (primary) hypertension: Secondary | ICD-10-CM

## 2020-03-07 DIAGNOSIS — E118 Type 2 diabetes mellitus with unspecified complications: Secondary | ICD-10-CM

## 2020-03-07 MED ORDER — OLMESARTAN MEDOXOMIL-HCTZ 40-25 MG PO TABS: 1.0000 | ORAL_TABLET | Freq: Every day | ORAL | 3 refills | Status: DC

## 2020-03-07 NOTE — Progress Notes (Signed)
Date:  03/07/2020   Name:  Megan Shields   DOB:  [birth year]   MRN:  [id number]   Chief Complaint: Diabetes and Hypertension  Hypertension This is a chronic problem. The problem is controlled. Pertinent negatives include no chest pain, headaches, palpitations or shortness of breath. Past treatments include angiotensin blockers and diuretics. The current treatment provides significant improvement. There are no compliance problems.   Diabetes She presents for her follow-up diabetic visit. She has type 2 diabetes mellitus. Her disease course has been stable. Pertinent negatives for hypoglycemia include no headaches or tremors. Pertinent negatives for diabetes include no chest pain, no fatigue, no polydipsia and no polyuria. Current diabetic treatment includes oral agent (monotherapy). She is compliant with treatment all of the time. Her weight is stable. She is following a generally healthy diet. She monitors blood glucose at home 1-2 x per day. Her breakfast blood glucose is taken between 6-7 am. Her breakfast blood glucose range is generally 130-140 mg/dl. An ACE inhibitor/angiotensin II receptor blocker is being taken.  Low Vitamin D - levels low last check.  Advised to take vitamin D daily which she has done.  Lab Results  Component Value Date   CREATININE 0.86 10/06/2019   BUN 19 10/06/2019   NA 140 10/06/2019   K 4.6 10/06/2019   CL 98 10/06/2019   CO2 27 10/06/2019   Lab Results  Component Value Date   CHOL 182 10/06/2019   HDL 68 10/06/2019   LDLCALC 98 10/06/2019   TRIG 87 10/06/2019   CHOLHDL 2.7 10/06/2019   Lab Results  Component Value Date   TSH 1.680 10/06/2019   Lab Results  Component Value Date   HGBA1C 7.9 (H) 10/06/2019   Lab Results  Component Value Date   WBC 4.0 10/06/2019   HGB 14.5 10/06/2019   HCT 46.0 10/06/2019   MCV 86 10/06/2019   PLT 248 10/06/2019   Lab Results  Component Value Date   ALT 13 10/06/2019   AST 18 10/06/2019   ALKPHOS 61  10/06/2019   BILITOT 0.4 10/06/2019   Last vitamin D Lab Results  Component Value Date   VD25OH 13.7 (L) 10/06/2019    Review of Systems  Constitutional: Negative for appetite change, fatigue, fever and unexpected weight change.  HENT: Negative for tinnitus and trouble swallowing.   Eyes: Negative for visual disturbance.  Respiratory: Negative for cough, chest tightness and shortness of breath.   Cardiovascular: Negative for chest pain, palpitations and leg swelling.  Gastrointestinal: Negative for abdominal pain.  Endocrine: Negative for polydipsia and polyuria.  Genitourinary: Negative for dysuria and hematuria.  Musculoskeletal: Negative for arthralgias.  Neurological: Negative for tremors, numbness and headaches.  Psychiatric/Behavioral: Negative for dysphoric mood.    Patient Active Problem List   Diagnosis Date Noted  . Vitamin D deficiency 10/06/2019  . Rosacea 04/08/2019  . Hyperlipidemia associated with type 2 diabetes mellitus ([place]) 04/08/2019  . Vertigo 04/07/2018  . Tubular adenoma of colon 03/10/2017  . Tinea pedis of left foot 11/22/2016  . Type II diabetes mellitus with complication ([place]) 41/28/7867  . Essential (primary) hypertension 08/01/2014  . 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 Arthritis of knee, degenerative 08/01/2014  . Low back strain 08/01/2014    Allergies  Allergen Reactions  . Pneumococcal Vaccines Hives  . Codeine     Other reaction(s): drowsiness  . Amoxicillin Rash    Past Surgical History:  Procedure Laterality Date  . BLEPHAROPLASTY Bilateral 12/19/2017  .  CARPAL TUNNEL RELEASE Bilateral 1985  . CHOLECYSTECTOMY    . COLONOSCOPY WITH PROPOFOL N/A 03/07/2017   Procedure: COLONOSCOPY WITH PROPOFOL;  Surgeon: [name], MD;  Location: [facility] ENDOSCOPY;  Service: Gastroenterology;  Laterality: N/A;  . DILATION AND CURETTAGE OF UTERUS  12/2011   benign endometrial polyp  . HALLUX VALGUS [place]IN Right 09/02/2014   Procedure: [place];  Surgeon: Samara Deist, DPM;  Location: [facility] ORS;  Service: Podiatry;  Laterality: Right;  . HERNIA REPAIR    . LAPAROSCOPIC GASTRIC BANDING  2010   Lap Band  . LAPAROSCOPIC TOTAL HYSTERECTOMY  12/23/2019  . lymph node removed Right   . TOTAL KNEE ARTHROPLASTY Bilateral 2003, 2004  . TOTAL KNEE ARTHROPLASTY Bilateral   . VENTRAL HERNIA REPAIR  2008    Social History   Tobacco Use  . Smoking status: Never Smoker  . Smokeless tobacco: Never Used  Vaping Use  . Vaping Use: Never used  Substance Use Topics  . Alcohol use: No    Alcohol/week: 0.0 standard drinks  . Drug use: No     Medication list has been reviewed and updated.  Current Meds  Medication Sig  . acetaminophen (TYLENOL) 500 MG tablet Take 1,000 mg by mouth at bedtime as needed.  . celecoxib (CELEBREX) 200 MG capsule Take 1 capsule (200 mg total) by mouth daily.  . cholecalciferol (VITAMIN D3) 25 MCG (1000 UNIT) tablet Take 1 tablet by mouth daily.  . cyclobenzaprine (FLEXERIL) 10 MG tablet Take 10 mg by mouth 3 (three) times daily as needed for muscle spasms.  Marland Kitchen FREESTYLE LITE test strip TEST BLOOD SUGAR TWICE A DAY  . Lancets (FREESTYLE) lancets TEST BLOOD SUGAR TWICE A DAY  . metFORMIN (GLUCOPHAGE) 500 MG tablet TAKE 1 TABLET THREE TIMES A DAY AFTER MEALS (Patient taking differently: Take 500 mg by mouth 2 (two) times daily with a meal.)  . olmesartan-hydrochlorothiazide (BENICAR HCT) 40-25 MG tablet TAKE 1 TABLET DAILY    PHQ 2/9 Scores 03/07/2020 11/22/2019 04/08/2019 10/06/2018  PHQ - 2 Score 0 0 0 0  PHQ- 9 Score 0 - 0 0    GAD 7 : Generalized Anxiety Score 03/07/2020  Nervous, Anxious, on Edge 0  Control/stop worrying 0  Worry too much - different things 0  Trouble relaxing 0  Restless 0  Easily annoyed or irritable 0  Afraid - awful might happen 0  Total GAD 7 Score 0  Anxiety Difficulty Not difficult at all    BP Readings from Last 3 Encounters:  03/07/20 132/76  10/06/19 128/76   04/08/19 130/76    Physical Exam Vitals and nursing note reviewed.  Constitutional:      General: She is not in acute distress.    Appearance: Normal appearance. She is well-developed.  HENT:     Head: Normocephalic and atraumatic.  Cardiovascular:     Rate and Rhythm: Normal rate and regular rhythm.  Pulmonary:     Effort: Pulmonary effort is normal. No respiratory distress.     Breath sounds: No wheezing or rhonchi.  Musculoskeletal:     Cervical back: Normal range of motion.     Right lower leg: No edema.     Left lower leg: No edema.  Lymphadenopathy:     Cervical: No cervical adenopathy.  Skin:    General: Skin is warm and dry.     Capillary Refill: Capillary refill takes less than 2 seconds.     Findings: No rash.  Neurological:  General: No focal deficit present.     Mental Status: She is alert and oriented to person, place, and time.  Psychiatric:        Mood and Affect: Mood and affect and mood normal.     Wt Readings from Last 3 Encounters:  03/07/20 291 lb (132 kg)  10/06/19 288 lb (130.6 kg)  04/08/19 297 lb (134.7 kg)    BP 132/76   Pulse 73   Temp 97.7 F (36.5 C) (Oral)   Ht 5\' 4"  (1.626 m)   Wt 291 lb (132 kg)   SpO2 95%   BMI 49.95 kg/m   Assessment and Plan: 1. Type II diabetes mellitus with complication (HCC) Clinically stable by exam and report without s/s of hypoglycemia. DM complicated by HTN. Tolerating medications -metformin -  well without side effects or other concerns. If A1C is not improved, will add [place] -discussed with patient - Hemoglobin A1c  2. Essential (primary) hypertension Clinically stable exam with well controlled BP on Benicar hct. Tolerating medications without side effects at this time. Pt to continue current regimen and low sodium diet; benefits of regular exercise as able discussed. - olmesartan-hydrochlorothiazide (BENICAR HCT) 40-25 MG tablet; Take 1 tablet by mouth daily.  Dispense: 90 tablet; Refill:  3  3. Vitamin D deficiency Continue daily supplement - VITAMIN D 25 Hydroxy (Vit-D Deficiency, Fractures)   Partially dictated using Editor, commissioning. Any errors are unintentional.  Halina Maidens, MD [facility]  03/07/2020

## 2020-03-08 LAB — HEMOGLOBIN A1C
Est. average glucose Bld gHb Est-mCnc: 180 mg/dL
Hgb A1c MFr Bld: 7.9 % — ABNORMAL HIGH (ref 4.8–5.6)

## 2020-03-08 LAB — VITAMIN D 25 HYDROXY (VIT D DEFICIENCY, FRACTURES): Vit D, 25-Hydroxy: 34.9 ng/mL (ref 30.0–100.0)

## 2020-04-14 ENCOUNTER — Other Ambulatory Visit: Payer: Self-pay

## 2020-04-14 ENCOUNTER — Ambulatory Visit (INDEPENDENT_AMBULATORY_CARE_PROVIDER_SITE_OTHER): Payer: Medicare Other | Admitting: Internal Medicine

## 2020-04-14 ENCOUNTER — Ambulatory Visit
Admission: RE | Admit: 2020-04-14 | Discharge: 2020-04-14 | Disposition: A | Discharge status: Home / Self Care | Payer: Medicare Other | Attending: Internal Medicine | Admitting: Internal Medicine

## 2020-04-14 ENCOUNTER — Encounter: Payer: Self-pay | Admitting: Internal Medicine

## 2020-04-14 ENCOUNTER — Ambulatory Visit
Admission: RE | Admit: 2020-04-14 | Discharge: 2020-04-14 | Disposition: A | Discharge status: Home / Self Care | Payer: Medicare Other | Source: Ambulatory Visit | Attending: Internal Medicine | Admitting: Internal Medicine

## 2020-04-14 VITALS — BP 134/82 | HR 83 | Temp 99.3°F | Ht 64.0 in | Wt 291.0 lb

## 2020-04-14 DIAGNOSIS — Z20822 Contact with and (suspected) exposure to covid-19: Secondary | ICD-10-CM

## 2020-04-14 DIAGNOSIS — J189 Pneumonia, unspecified organism: Secondary | ICD-10-CM

## 2020-04-14 DIAGNOSIS — R059 Cough, unspecified: Secondary | ICD-10-CM | POA: Diagnosis not present

## 2020-04-14 MED ORDER — PROMETHAZINE-DM 6.25-15 MG/5ML PO SYRP: 5.0000 mL | ORAL_SOLUTION | Freq: Four times a day (QID) | ORAL | 0 refills | Status: DC | PRN

## 2020-04-14 MED ORDER — AZITHROMYCIN 250 MG PO TABS: ORAL_TABLET | ORAL | 0 refills | Status: AC

## 2020-04-14 NOTE — Progress Notes (Signed)
Date:  04/14/2020   Name:  Megan Shields   DOB:  December 28, 1945   MRN:  [id number]   Chief Complaint: Cough (X4 weeks, chest congestion, pt has coughed threw up due to mucous feeling stuck in throat, took home covid test X3 weeks ago (negative))  Cough This is a new problem. The current episode started 1 to 4 weeks ago. The problem has been unchanged. The problem occurs every few minutes. The cough is productive of sputum. Associated symptoms include wheezing. Pertinent negatives include no chest pain, chills, ear congestion, ear pain, fever, headaches, nasal congestion, postnasal drip, sore throat, shortness of breath (none at rest) or sweats.    Lab Results  Component Value Date   CREATININE 0.86 10/06/2019   BUN 19 10/06/2019   NA 140 10/06/2019   K 4.6 10/06/2019   CL 98 10/06/2019   CO2 27 10/06/2019   Lab Results  Component Value Date   CHOL 182 10/06/2019   HDL 68 10/06/2019   LDLCALC 98 10/06/2019   TRIG 87 10/06/2019   CHOLHDL 2.7 10/06/2019   Lab Results  Component Value Date   TSH 1.680 10/06/2019   Lab Results  Component Value Date   HGBA1C 7.9 (H) 03/07/2020   Lab Results  Component Value Date   WBC 4.0 10/06/2019   HGB 14.5 10/06/2019   HCT 46.0 10/06/2019   MCV 86 10/06/2019   PLT 248 10/06/2019   Lab Results  Component Value Date   ALT 13 10/06/2019   AST 18 10/06/2019   ALKPHOS 61 10/06/2019   BILITOT 0.4 10/06/2019     Review of Systems  Constitutional: Negative for chills, diaphoresis and fever.  HENT: Positive for congestion. Negative for ear pain, postnasal drip, sore throat and trouble swallowing.   Respiratory: Positive for cough, chest tightness and wheezing. Negative for shortness of breath (none at rest).   Cardiovascular: Negative for chest pain and palpitations.  Gastrointestinal: Positive for vomiting (with strong cough). Negative for diarrhea and nausea.  Neurological: Negative for dizziness and headaches.    Patient Active  Problem List   Diagnosis Date Noted  . Vitamin D deficiency 10/06/2019  . Rosacea 04/08/2019  . Hyperlipidemia associated with type 2 diabetes mellitus ([place]) 04/08/2019  . Vertigo 04/07/2018  . Tubular adenoma of colon 03/10/2017  . Tinea pedis of left foot 11/22/2016  . Type II diabetes mellitus with complication ([place]) 25/95/6387  . Essential (primary) hypertension 08/01/2014  . 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 Arthritis of knee, degenerative 08/01/2014  . Low back strain 08/01/2014    Allergies  Allergen Reactions  . Pneumococcal Vaccines Hives  . Codeine     Other reaction(s): drowsiness  . Amoxicillin Rash    Past Surgical History:  Procedure Laterality Date  . BLEPHAROPLASTY Bilateral 12/19/2017  . CARPAL TUNNEL RELEASE Bilateral 1985  . CHOLECYSTECTOMY    . COLONOSCOPY WITH PROPOFOL N/A 03/07/2017   Procedure: COLONOSCOPY WITH PROPOFOL;  Surgeon: [name], MD;  Location: [facility] ENDOSCOPY;  Service: Gastroenterology;  Laterality: N/A;  . DILATION AND CURETTAGE OF UTERUS  12/2011   benign endometrial polyp  . HALLUX VALGUS [place]USTIN Right 09/02/2014   Procedure: [place];  Surgeon: Samara Deist, DPM;  Location: [facility] ORS;  Service: Podiatry;  Laterality: Right;  . HERNIA REPAIR    . LAPAROSCOPIC GASTRIC BANDING  2010   Lap Band  . LAPAROSCOPIC TOTAL HYSTERECTOMY  12/23/2019  . lymph node removed Right   . TOTAL KNEE ARTHROPLASTY  Bilateral 2003, 2004  . TOTAL KNEE ARTHROPLASTY Bilateral   . VENTRAL HERNIA REPAIR  2008    Social History   Tobacco Use  . Smoking status: Never Smoker  . Smokeless tobacco: Never Used  Vaping Use  . Vaping Use: Never used  Substance Use Topics  . Alcohol use: No    Alcohol/week: 0.0 standard drinks  . Drug use: No     Medication list has been reviewed and updated.  Current Meds  Medication Sig  . acetaminophen (TYLENOL) 500 MG tablet Take 1,000 mg by mouth at bedtime as needed.  . celecoxib (CELEBREX) 200 MG  capsule Take 1 capsule (200 mg total) by mouth daily.  . cholecalciferol (VITAMIN D3) 25 MCG (1000 UNIT) tablet Take 1 tablet by mouth daily.  . cyclobenzaprine (FLEXERIL) 10 MG tablet Take 10 mg by mouth 3 (three) times daily as needed for muscle spasms.  Marland Kitchen FREESTYLE LITE test strip TEST BLOOD SUGAR TWICE A DAY  . Lancets (FREESTYLE) lancets TEST BLOOD SUGAR TWICE A DAY  . metFORMIN (GLUCOPHAGE) 500 MG tablet TAKE 1 TABLET THREE TIMES A DAY AFTER MEALS (Patient taking differently: Take 500 mg by mouth 2 (two) times daily with a meal.)  . olmesartan-hydrochlorothiazide (BENICAR HCT) 40-25 MG tablet Take 1 tablet by mouth daily.    PHQ 2/9 Scores 04/14/2020 03/07/2020 11/22/2019 04/08/2019  PHQ - 2 Score 0 0 0 0  PHQ- 9 Score 4 0 - 0    GAD 7 : Generalized Anxiety Score 04/14/2020 03/07/2020  Nervous, Anxious, on Edge 0 0  Control/stop worrying 0 0  Worry too much - different things 0 0  Trouble relaxing 0 0  Restless 0 0  Easily annoyed or irritable 0 0  Afraid - awful might happen 0 0  Total GAD 7 Score 0 0  Anxiety Difficulty - Not difficult at all    BP Readings from Last 3 Encounters:  04/14/20 134/82  03/07/20 132/76  10/06/19 128/76    Physical Exam Vitals and nursing note reviewed.  Constitutional:      General: She is not in acute distress.    Appearance: She is well-developed.  HENT:     Head: Normocephalic and atraumatic.  Cardiovascular:     Rate and Rhythm: Normal rate and regular rhythm.     Heart sounds: Normal heart sounds.  Pulmonary:     Effort: Pulmonary effort is normal. No accessory muscle usage or respiratory distress.     Breath sounds: Examination of the right-lower field reveals rhonchi. Rhonchi present. No wheezing.  Skin:    General: Skin is warm and dry.     Findings: No rash.  Neurological:     Mental Status: She is alert and oriented to person, place, and time.  Psychiatric:        Mood and Affect: Mood normal.        Behavior: Behavior  normal.     Wt Readings from Last 3 Encounters:  04/14/20 291 lb (132 kg)  03/07/20 291 lb (132 kg)  10/06/19 288 lb (130.6 kg)    BP 134/82   Pulse 83   Temp 99.3 F (37.4 C) (Oral)   Ht 5\' 4"  (1.626 m)   Wt 291 lb (132 kg)   SpO2 95%   BMI 49.95 kg/m   Assessment and Plan: 1. Suspected COVID-19 virus infection - Novel Coronavirus, NAA ([facility])  2. Community acquired pneumonia of right lower lobe of lung Abnormal lung sounds, low grade fever and  persistent cough Will get Xray stat since it is Friday; begin antibiotics Continue mucinex - DG Chest 2 View; Future - promethazine-dextromethorphan (PROMETHAZINE-DM) 6.25-15 MG/5ML syrup; Take 5 mLs by mouth 4 (four) times daily as needed for cough.  Dispense: 180 mL; Refill: 0 - azithromycin (ZITHROMAX Z-PAK) 250 MG tablet; UAD  Dispense: 6 each; Refill: 0   Partially dictated using Editor, commissioning. Any errors are unintentional.  Halina Maidens, MD [facility]  04/14/2020

## 2020-04-15 LAB — NOVEL CORONAVIRUS, NAA: SARS-CoV-2, NAA: NOT DETECTED

## 2020-04-15 LAB — SARS-COV-2, NAA 2 DAY TAT

## 2020-04-20 ENCOUNTER — Encounter: Payer: Self-pay | Admitting: Internal Medicine

## 2020-04-21 DIAGNOSIS — L821 Other seborrheic keratosis: Secondary | ICD-10-CM | POA: Diagnosis not present

## 2020-04-21 DIAGNOSIS — L570 Actinic keratosis: Secondary | ICD-10-CM | POA: Diagnosis not present

## 2020-04-21 DIAGNOSIS — L720 Epidermal cyst: Secondary | ICD-10-CM | POA: Diagnosis not present

## 2020-04-26 ENCOUNTER — Encounter: Payer: Self-pay | Admitting: Internal Medicine

## 2020-04-26 ENCOUNTER — Ambulatory Visit (INDEPENDENT_AMBULATORY_CARE_PROVIDER_SITE_OTHER): Payer: Medicare Other | Admitting: Internal Medicine

## 2020-04-26 ENCOUNTER — Other Ambulatory Visit: Payer: Self-pay

## 2020-04-26 VITALS — BP 132/78 | HR 77 | Temp 98.1°F | Ht 64.0 in | Wt 289.0 lb

## 2020-04-26 DIAGNOSIS — J189 Pneumonia, unspecified organism: Secondary | ICD-10-CM | POA: Diagnosis not present

## 2020-04-26 DIAGNOSIS — J9801 Acute bronchospasm: Secondary | ICD-10-CM | POA: Diagnosis not present

## 2020-04-26 MED ORDER — PREDNISONE 10 MG PO TABS: 10.0000 mg | ORAL_TABLET | ORAL | 0 refills | Status: AC

## 2020-04-26 MED ORDER — PROMETHAZINE-DM 6.25-15 MG/5ML PO SYRP: 5.0000 mL | ORAL_SOLUTION | Freq: Four times a day (QID) | ORAL | 0 refills | Status: DC | PRN

## 2020-04-26 MED ORDER — LEVOFLOXACIN 500 MG PO TABS: 500.0000 mg | ORAL_TABLET | Freq: Every day | ORAL | 0 refills | Status: AC

## 2020-04-26 NOTE — Telephone Encounter (Signed)
Please review.  KP

## 2020-04-26 NOTE — Progress Notes (Signed)
Date:  04/26/2020   Name:  Megan Shields   DOB:  1945/11/10   MRN:  [id number]   Chief Complaint: crackling in chest  (X6 weeks, still has cough, wheezing sometimes, No SOB)  Cough This is a chronic problem. The current episode started 1 to 4 weeks ago. The problem has been unchanged. The problem occurs every few minutes. The cough is non-productive. Associated symptoms include postnasal drip, shortness of breath and wheezing. Pertinent negatives include no chest pain, chills, fever, headaches or weight loss. She has tried prescription cough suppressant for the symptoms.  Treated for Covid pneumonia with Zpak. CXR done 2 weeks ago.   She is still coughing, has chest rattling and PND.  No SOB at rest but gets SOB with cough.  Lab Results  Component Value Date   CREATININE 0.86 10/06/2019   BUN 19 10/06/2019   NA 140 10/06/2019   K 4.6 10/06/2019   CL 98 10/06/2019   CO2 27 10/06/2019   Lab Results  Component Value Date   CHOL 182 10/06/2019   HDL 68 10/06/2019   LDLCALC 98 10/06/2019   TRIG 87 10/06/2019   CHOLHDL 2.7 10/06/2019   Lab Results  Component Value Date   TSH 1.680 10/06/2019   Lab Results  Component Value Date   HGBA1C 7.9 (H) 03/07/2020   Lab Results  Component Value Date   WBC 4.0 10/06/2019   HGB 14.5 10/06/2019   HCT 46.0 10/06/2019   MCV 86 10/06/2019   PLT 248 10/06/2019   Lab Results  Component Value Date   ALT 13 10/06/2019   AST 18 10/06/2019   ALKPHOS 61 10/06/2019   BILITOT 0.4 10/06/2019     Review of Systems  Constitutional: Positive for fatigue. Negative for chills, fever and weight loss.  HENT: Positive for postnasal drip. Negative for trouble swallowing.   Respiratory: Positive for cough, shortness of breath and wheezing.   Cardiovascular: Negative for chest pain.  Neurological: Negative for dizziness and headaches.    Patient Active Problem List   Diagnosis Date Noted  . Vitamin D deficiency 10/06/2019  . Rosacea  04/08/2019  . Hyperlipidemia associated with type 2 diabetes mellitus ([place]) 04/08/2019  . Vertigo 04/07/2018  . Tubular adenoma of colon 03/10/2017  . Tinea pedis of left foot 11/22/2016  . Type II diabetes mellitus with complication ([place]) 02/63/7858  . Essential (primary) hypertension 08/01/2014  . 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 Arthritis of knee, degenerative 08/01/2014  . Low back strain 08/01/2014    Allergies  Allergen Reactions  . Pneumococcal Vaccines Hives  . Codeine     Other reaction(s): drowsiness  . Amoxicillin Rash    Past Surgical History:  Procedure Laterality Date  . BLEPHAROPLASTY Bilateral 12/19/2017  . CARPAL TUNNEL RELEASE Bilateral 1985  . CHOLECYSTECTOMY    . COLONOSCOPY WITH PROPOFOL N/A 03/07/2017   Procedure: COLONOSCOPY WITH PROPOFOL;  Surgeon: [name], MD;  Location: [facility] ENDOSCOPY;  Service: Gastroenterology;  Laterality: N/A;  . DILATION AND CURETTAGE OF UTERUS  12/2011   benign endometrial polyp  . HALLUX VALGUS AUSTIN Right 09/02/2014   Procedure: [place];  Surgeon: Samara Deist, DPM;  Location: [facility] ORS;  Service: Podiatry;  Laterality: Right;  . HERNIA REPAIR    . LAPAROSCOPIC GASTRIC BANDING  2010   Lap Band  . LAPAROSCOPIC TOTAL HYSTERECTOMY  12/23/2019  . lymph node removed Right   . TOTAL KNEE ARTHROPLASTY Bilateral 2003, 2004  . TOTAL KNEE  ARTHROPLASTY Bilateral   . VENTRAL HERNIA REPAIR  2008    Social History   Tobacco Use  . Smoking status: Never Smoker  . Smokeless tobacco: Never Used  Vaping Use  . Vaping Use: Never used  Substance Use Topics  . Alcohol use: No    Alcohol/week: 0.0 standard drinks  . Drug use: No     Medication list has been reviewed and updated.  Current Meds  Medication Sig  . acetaminophen (TYLENOL) 500 MG tablet Take 1,000 mg by mouth at bedtime as needed.  . celecoxib (CELEBREX) 200 MG capsule Take 1 capsule (200 mg total) by mouth daily.  . cholecalciferol (VITAMIN D3)  25 MCG (1000 UNIT) tablet Take 1 tablet by mouth daily.  . cyclobenzaprine (FLEXERIL) 10 MG tablet Take 10 mg by mouth 3 (three) times daily as needed for muscle spasms.  Marland Kitchen FREESTYLE LITE test strip TEST BLOOD SUGAR TWICE A DAY  . Lancets (FREESTYLE) lancets TEST BLOOD SUGAR TWICE A DAY  . metFORMIN (GLUCOPHAGE) 500 MG tablet TAKE 1 TABLET THREE TIMES A DAY AFTER MEALS (Patient taking differently: Take 500 mg by mouth 2 (two) times daily with a meal.)  . olmesartan-hydrochlorothiazide (BENICAR HCT) 40-25 MG tablet Take 1 tablet by mouth daily.  . promethazine-dextromethorphan (PROMETHAZINE-DM) 6.25-15 MG/5ML syrup Take 5 mLs by mouth 4 (four) times daily as needed for cough.    PHQ 2/9 Scores 04/26/2020 04/14/2020 03/07/2020 11/22/2019  PHQ - 2 Score 0 0 0 0  PHQ- 9 Score 8 4 0 -    GAD 7 : Generalized Anxiety Score 04/26/2020 04/14/2020 03/07/2020  Nervous, Anxious, on Edge 0 0 0  Control/stop worrying 0 0 0  Worry too much - different things 0 0 0  Trouble relaxing 0 0 0  Restless 0 0 0  Easily annoyed or irritable 0 0 0  Afraid - awful might happen 0 0 0  Total GAD 7 Score 0 0 0  Anxiety Difficulty - - Not difficult at all    BP Readings from Last 3 Encounters:  04/26/20 132/78  04/14/20 134/82  03/07/20 132/76    Physical Exam Vitals and nursing note reviewed.  Constitutional:      General: She is not in acute distress.    Appearance: She is well-developed.  HENT:     Head: Normocephalic and atraumatic.  Cardiovascular:     Rate and Rhythm: Normal rate and regular rhythm.  Pulmonary:     Effort: Pulmonary effort is normal. No respiratory distress.     Breath sounds: Examination of the right-upper field reveals wheezing. Examination of the left-upper field reveals wheezing. Examination of the left-middle field reveals rhonchi. Wheezing and rhonchi present.  Musculoskeletal:     Cervical back: Normal range of motion.  Lymphadenopathy:     Cervical: No cervical adenopathy.   Skin:    General: Skin is warm and dry.     Findings: No rash.  Neurological:     Mental Status: She is alert and oriented to person, place, and time.  Psychiatric:        Mood and Affect: Mood normal.        Behavior: Behavior normal.     Wt Readings from Last 3 Encounters:  04/26/20 289 lb (131.1 kg)  04/14/20 291 lb (132 kg)  03/07/20 291 lb (132 kg)    BP 132/78   Pulse 77   Temp 98.1 F (36.7 C) (Oral)   Ht 5\' 4"  (1.626 m)   Wt  289 lb (131.1 kg)   SpO2 95%   BMI 49.61 kg/m   Assessment and Plan: 1. Community acquired pneumonia of right lower lobe of lung Minimal improvement after Zpak Will treat with Levaquin, refill cough medication Pt to follow up in one week via Mychart message F/u CXR in 3-4 weeks - levofloxacin (LEVAQUIN) 500 MG tablet; Take 1 tablet (500 mg total) by mouth daily for 7 days.  Dispense: 7 tablet; Refill: 0 - promethazine-dextromethorphan (PROMETHAZINE-DM) 6.25-15 MG/5ML syrup; Take 5 mLs by mouth 4 (four) times daily as needed for cough.  Dispense: 180 mL; Refill: 0  2. Bronchospasm Sample of Breztri 2 puffs bid and steroid taper - predniSONE (DELTASONE) 10 MG tablet; Take 1 tablet (10 mg total) by mouth as directed for 6 days. Take 6,5,4,3,2,1 then stop  Dispense: 21 tablet; Refill: 0   Partially dictated using Editor, commissioning. Any errors are unintentional.  Halina Maidens, MD [facility]  04/26/2020

## 2020-04-26 NOTE — Patient Instructions (Signed)
Breztri - 2 inhalations twice a day.

## 2020-04-27 ENCOUNTER — Ambulatory Visit: Payer: Self-pay | Admitting: Internal Medicine

## 2020-05-02 ENCOUNTER — Other Ambulatory Visit: Payer: Self-pay

## 2020-05-02 ENCOUNTER — Ambulatory Visit
Admission: RE | Admit: 2020-05-02 | Discharge: 2020-05-02 | Disposition: A | Discharge status: Home / Self Care | Payer: Medicare Other | Attending: Internal Medicine | Admitting: Internal Medicine

## 2020-05-02 ENCOUNTER — Ambulatory Visit
Admission: RE | Admit: 2020-05-02 | Discharge: 2020-05-02 | Disposition: A | Discharge status: Home / Self Care | Payer: Medicare Other | Source: Ambulatory Visit | Attending: Internal Medicine | Admitting: Internal Medicine

## 2020-05-02 ENCOUNTER — Other Ambulatory Visit: Payer: Self-pay | Admitting: Internal Medicine

## 2020-05-02 DIAGNOSIS — R0602 Shortness of breath: Secondary | ICD-10-CM | POA: Diagnosis not present

## 2020-05-02 DIAGNOSIS — J189 Pneumonia, unspecified organism: Secondary | ICD-10-CM

## 2020-05-02 DIAGNOSIS — U071 COVID-19: Secondary | ICD-10-CM

## 2020-05-02 DIAGNOSIS — J1282 Pneumonia due to coronavirus disease 2019: Secondary | ICD-10-CM

## 2020-05-02 DIAGNOSIS — R059 Cough, unspecified: Secondary | ICD-10-CM | POA: Diagnosis not present

## 2020-05-04 DIAGNOSIS — R059 Cough, unspecified: Secondary | ICD-10-CM | POA: Diagnosis not present

## 2020-05-04 DIAGNOSIS — R053 Chronic cough: Secondary | ICD-10-CM | POA: Diagnosis not present

## 2020-05-04 DIAGNOSIS — J811 Chronic pulmonary edema: Secondary | ICD-10-CM | POA: Diagnosis not present

## 2020-05-04 DIAGNOSIS — J849 Interstitial pulmonary disease, unspecified: Secondary | ICD-10-CM | POA: Diagnosis not present

## 2020-05-05 ENCOUNTER — Other Ambulatory Visit: Payer: Self-pay | Admitting: Specialist

## 2020-05-05 DIAGNOSIS — R053 Chronic cough: Secondary | ICD-10-CM

## 2020-05-05 DIAGNOSIS — J849 Interstitial pulmonary disease, unspecified: Secondary | ICD-10-CM

## 2020-05-08 DIAGNOSIS — R053 Chronic cough: Secondary | ICD-10-CM | POA: Diagnosis not present

## 2020-05-09 ENCOUNTER — Other Ambulatory Visit: Payer: Self-pay | Admitting: Internal Medicine

## 2020-05-09 DIAGNOSIS — M170 Bilateral primary osteoarthritis of knee: Secondary | ICD-10-CM

## 2020-05-11 ENCOUNTER — Ambulatory Visit: Payer: Self-pay | Admitting: Internal Medicine

## 2020-05-11 DIAGNOSIS — L720 Epidermal cyst: Secondary | ICD-10-CM | POA: Diagnosis not present

## 2020-05-23 ENCOUNTER — Other Ambulatory Visit: Payer: Self-pay

## 2020-05-23 ENCOUNTER — Other Ambulatory Visit: Payer: Self-pay | Admitting: Specialist

## 2020-05-23 ENCOUNTER — Ambulatory Visit
Admission: RE | Admit: 2020-05-23 | Discharge: 2020-05-23 | Disposition: A | Discharge status: Home / Self Care | Payer: Medicare Other | Source: Ambulatory Visit | Attending: Specialist | Admitting: Specialist

## 2020-05-23 ENCOUNTER — Ambulatory Visit: Admission: RE | Admit: 2020-05-23 | Payer: Medicare Other | Source: Home / Self Care | Admitting: Specialist

## 2020-05-23 DIAGNOSIS — R053 Chronic cough: Secondary | ICD-10-CM | POA: Insufficient documentation

## 2020-05-23 DIAGNOSIS — J849 Interstitial pulmonary disease, unspecified: Secondary | ICD-10-CM

## 2020-05-23 DIAGNOSIS — I2510 Atherosclerotic heart disease of native coronary artery without angina pectoris: Secondary | ICD-10-CM | POA: Diagnosis not present

## 2020-05-23 DIAGNOSIS — I708 Atherosclerosis of other arteries: Secondary | ICD-10-CM | POA: Diagnosis not present

## 2020-05-23 DIAGNOSIS — I700 Atherosclerosis of aorta: Secondary | ICD-10-CM | POA: Diagnosis not present

## 2020-05-23 DIAGNOSIS — R059 Cough, unspecified: Secondary | ICD-10-CM | POA: Diagnosis not present

## 2020-05-29 DIAGNOSIS — Z466 Encounter for fitting and adjustment of urinary device: Secondary | ICD-10-CM | POA: Diagnosis not present

## 2020-05-29 DIAGNOSIS — G35 Multiple sclerosis: Secondary | ICD-10-CM | POA: Diagnosis not present

## 2020-05-29 DIAGNOSIS — K5903 Drug induced constipation: Secondary | ICD-10-CM | POA: Diagnosis not present

## 2020-05-29 DIAGNOSIS — G8929 Other chronic pain: Secondary | ICD-10-CM | POA: Diagnosis not present

## 2020-05-29 DIAGNOSIS — R338 Other retention of urine: Secondary | ICD-10-CM | POA: Diagnosis not present

## 2020-05-29 DIAGNOSIS — L89313 Pressure ulcer of right buttock, stage 3: Secondary | ICD-10-CM | POA: Diagnosis not present

## 2020-05-31 DIAGNOSIS — R0600 Dyspnea, unspecified: Secondary | ICD-10-CM | POA: Diagnosis not present

## 2020-05-31 DIAGNOSIS — R918 Other nonspecific abnormal finding of lung field: Secondary | ICD-10-CM | POA: Diagnosis not present

## 2020-05-31 DIAGNOSIS — R059 Cough, unspecified: Secondary | ICD-10-CM | POA: Diagnosis not present

## 2020-06-02 ENCOUNTER — Other Ambulatory Visit: Payer: Self-pay | Admitting: Specialist

## 2020-06-02 DIAGNOSIS — R059 Cough, unspecified: Secondary | ICD-10-CM

## 2020-06-02 DIAGNOSIS — R918 Other nonspecific abnormal finding of lung field: Secondary | ICD-10-CM

## 2020-06-02 DIAGNOSIS — R0600 Dyspnea, unspecified: Secondary | ICD-10-CM

## 2020-06-02 DIAGNOSIS — R0609 Other forms of dyspnea: Secondary | ICD-10-CM

## 2020-06-13 ENCOUNTER — Other Ambulatory Visit: Payer: Self-pay

## 2020-06-13 ENCOUNTER — Ambulatory Visit
Admission: RE | Admit: 2020-06-13 | Discharge: 2020-06-13 | Disposition: A | Discharge status: Home / Self Care | Payer: Medicare Other | Source: Ambulatory Visit | Attending: Specialist | Admitting: Specialist

## 2020-06-13 DIAGNOSIS — R918 Other nonspecific abnormal finding of lung field: Secondary | ICD-10-CM | POA: Insufficient documentation

## 2020-06-13 DIAGNOSIS — I700 Atherosclerosis of aorta: Secondary | ICD-10-CM | POA: Insufficient documentation

## 2020-06-13 DIAGNOSIS — R0600 Dyspnea, unspecified: Secondary | ICD-10-CM | POA: Insufficient documentation

## 2020-06-13 DIAGNOSIS — R059 Cough, unspecified: Secondary | ICD-10-CM | POA: Diagnosis not present

## 2020-06-13 DIAGNOSIS — D3501 Benign neoplasm of right adrenal gland: Secondary | ICD-10-CM | POA: Diagnosis not present

## 2020-06-13 DIAGNOSIS — C7800 Secondary malignant neoplasm of unspecified lung: Secondary | ICD-10-CM | POA: Insufficient documentation

## 2020-06-13 DIAGNOSIS — R0609 Other forms of dyspnea: Secondary | ICD-10-CM

## 2020-06-13 LAB — GLUCOSE, CAPILLARY: Glucose-Capillary: 135 mg/dL — ABNORMAL HIGH (ref 70–99)

## 2020-06-13 MED ORDER — FLUDEOXYGLUCOSE F - 18 (FDG) INJECTION
15.0000 | Freq: Once | INTRAVENOUS | Status: AC | PRN
  Administered 2020-06-13: 14.9 via INTRAVENOUS

## 2020-06-14 DIAGNOSIS — R059 Cough, unspecified: Secondary | ICD-10-CM | POA: Diagnosis not present

## 2020-06-14 DIAGNOSIS — R918 Other nonspecific abnormal finding of lung field: Secondary | ICD-10-CM | POA: Diagnosis not present

## 2020-06-14 DIAGNOSIS — R0600 Dyspnea, unspecified: Secondary | ICD-10-CM | POA: Diagnosis not present

## 2020-06-15 ENCOUNTER — Ambulatory Visit: Payer: Medicare Other

## 2020-06-19 ENCOUNTER — Other Ambulatory Visit: Payer: Self-pay | Admitting: General Surgery

## 2020-06-19 DIAGNOSIS — C770 Secondary and unspecified malignant neoplasm of lymph nodes of head, face and neck: Secondary | ICD-10-CM | POA: Diagnosis not present

## 2020-06-19 DIAGNOSIS — C3491 Malignant neoplasm of unspecified part of right bronchus or lung: Secondary | ICD-10-CM | POA: Diagnosis not present

## 2020-06-19 DIAGNOSIS — C3411 Malignant neoplasm of upper lobe, right bronchus or lung: Secondary | ICD-10-CM | POA: Diagnosis not present

## 2020-06-21 ENCOUNTER — Other Ambulatory Visit: Payer: Self-pay | Admitting: Anatomic Pathology & Clinical Pathology

## 2020-06-21 LAB — SURGICAL PATHOLOGY

## 2020-06-28 ENCOUNTER — Encounter: Payer: Self-pay | Admitting: *Deleted

## 2020-06-28 NOTE — Progress Notes (Signed)
  Oncology Nurse Navigator Documentation  Navigator Location: [facility] (06/28/20 1300) Referral Date to RadOnc/MedOnc: 06/28/20 (06/28/20 1300) )Navigator Encounter Type: Telephone (06/28/20 1300) Telephone: Appt Confirmation/Clarification;Outgoing Call (06/28/20 1300) Abnormal Finding Date: 05/24/20 (06/28/20 1300) Confirmed Diagnosis Date: 06/21/20 (06/28/20 1300)                   Barriers/Navigation Needs: Coordination of Care (06/28/20 1300)   Interventions: Coordination of Care (06/28/20 1300)   Coordination of Care: Appts (06/28/20 1300)           phone call made to patient to review upcoming appts to discuss biopsy results and treatment options. All questions answered during call. Appt confirmed with Dr. Jacinto Reap on Fri 5/6 at 2:30pm. Contact info given and instructed to call with any further questions or needs. Pt verbalized understanding.        Time Spent with Patient: 30 (06/28/20 1300)

## 2020-06-29 ENCOUNTER — Other Ambulatory Visit: Payer: Medicare Other

## 2020-06-29 NOTE — Progress Notes (Signed)
Tumor Board Documentation  Megan Shields was presented by Norvel Richards, RN at our Tumor Board on 06/29/2020, which included representatives from medical oncology,radiation oncology,surgical oncology,surgical,radiology,pathology,navigation,internal medicine,research,palliative care,genetics,pharmacy,pulmonology.  Megan Shields currently presents as a new patient,for MDC,for new positive pathology with history of the following treatments: surgical intervention(s).  Additionally, we reviewed previous medical and familial history, history of present illness, and recent lab results along with all available histopathologic and imaging studies. The tumor board considered available treatment options and made the following recommendations: Additional screening (MRI Brain) Chemo vs tageted therapy pending NGS results  The following procedures/referrals were also placed: No orders of the defined types were placed in this encounter.   Clinical Trial Status: not discussed   Staging used: AJCC Stage Group  AJCC Staging: T: 4 N: 2 M: 1 Group: Stage IV A Metastatic Lung cancer   National site-specific guidelines NCCN were discussed with respect to the case.  Tumor board is a meeting of clinicians from various specialty areas who evaluate and discuss patients for whom a multidisciplinary approach is being considered. Final determinations in the plan of care are those of the provider(s). The responsibility for follow up of recommendations given during tumor board is that of the provider.   Today's extended care, comprehensive team conference, Megan Shields was not present for the discussion and was not examined.   Multidisciplinary Tumor Board is a multidisciplinary case peer review process.  Decisions discussed in the Multidisciplinary Tumor Board reflect the opinions of the specialists present at the conference without having examined the patient.  Ultimately, treatment and diagnostic decisions rest with the primary  provider(s) and the patient.

## 2020-06-30 ENCOUNTER — Other Ambulatory Visit: Payer: Self-pay

## 2020-06-30 ENCOUNTER — Inpatient Hospital Stay: Payer: Medicare Other | Attending: Internal Medicine | Admitting: Internal Medicine

## 2020-06-30 ENCOUNTER — Inpatient Hospital Stay: Payer: Medicare Other

## 2020-06-30 ENCOUNTER — Encounter: Payer: Self-pay | Admitting: Internal Medicine

## 2020-06-30 ENCOUNTER — Encounter: Payer: Self-pay | Admitting: *Deleted

## 2020-06-30 DIAGNOSIS — Z79899 Other long term (current) drug therapy: Secondary | ICD-10-CM | POA: Insufficient documentation

## 2020-06-30 DIAGNOSIS — C3411 Malignant neoplasm of upper lobe, right bronchus or lung: Secondary | ICD-10-CM | POA: Diagnosis present

## 2020-06-30 DIAGNOSIS — Z7984 Long term (current) use of oral hypoglycemic drugs: Secondary | ICD-10-CM | POA: Diagnosis not present

## 2020-06-30 DIAGNOSIS — Z803 Family history of malignant neoplasm of breast: Secondary | ICD-10-CM | POA: Diagnosis not present

## 2020-06-30 DIAGNOSIS — Z87891 Personal history of nicotine dependence: Secondary | ICD-10-CM

## 2020-06-30 DIAGNOSIS — Z9981 Dependence on supplemental oxygen: Secondary | ICD-10-CM | POA: Diagnosis not present

## 2020-06-30 DIAGNOSIS — Z8616 Personal history of COVID-19: Secondary | ICD-10-CM | POA: Diagnosis not present

## 2020-06-30 DIAGNOSIS — D701 Agranulocytosis secondary to cancer chemotherapy: Secondary | ICD-10-CM | POA: Diagnosis not present

## 2020-06-30 DIAGNOSIS — C770 Secondary and unspecified malignant neoplasm of lymph nodes of head, face and neck: Secondary | ICD-10-CM | POA: Diagnosis not present

## 2020-06-30 DIAGNOSIS — C3490 Malignant neoplasm of unspecified part of unspecified bronchus or lung: Secondary | ICD-10-CM

## 2020-06-30 DIAGNOSIS — I10 Essential (primary) hypertension: Secondary | ICD-10-CM | POA: Insufficient documentation

## 2020-06-30 DIAGNOSIS — T451X5A Adverse effect of antineoplastic and immunosuppressive drugs, initial encounter: Secondary | ICD-10-CM | POA: Diagnosis not present

## 2020-06-30 DIAGNOSIS — Z5111 Encounter for antineoplastic chemotherapy: Secondary | ICD-10-CM | POA: Insufficient documentation

## 2020-06-30 DIAGNOSIS — E1165 Type 2 diabetes mellitus with hyperglycemia: Secondary | ICD-10-CM | POA: Diagnosis not present

## 2020-06-30 HISTORY — DX: Malignant neoplasm of upper lobe, right bronchus or lung: C34.11

## 2020-06-30 LAB — CBC WITH DIFFERENTIAL/PLATELET
Abs Immature Granulocytes: 0.01 10*3/uL (ref 0.00–0.07)
Basophils Absolute: 0 10*3/uL (ref 0.0–0.1)
Basophils Relative: 0 %
Eosinophils Absolute: 0.4 10*3/uL (ref 0.0–0.5)
Eosinophils Relative: 7 %
HCT: 43.1 % (ref 36.0–46.0)
Hemoglobin: 13.9 g/dL (ref 12.0–15.0)
Immature Granulocytes: 0 %
Lymphocytes Relative: 18 %
Lymphs Abs: 1 10*3/uL (ref 0.7–4.0)
MCH: 27.3 pg (ref 26.0–34.0)
MCHC: 32.3 g/dL (ref 30.0–36.0)
MCV: 84.7 fL (ref 80.0–100.0)
Monocytes Absolute: 0.6 10*3/uL (ref 0.1–1.0)
Monocytes Relative: 11 %
Neutro Abs: 3.4 10*3/uL (ref 1.7–7.7)
Neutrophils Relative %: 64 %
Platelets: 240 10*3/uL (ref 150–400)
RBC: 5.09 MIL/uL (ref 3.87–5.11)
RDW: 13.3 % (ref 11.5–15.5)
WBC: 5.4 10*3/uL (ref 4.0–10.5)
nRBC: 0 % (ref 0.0–0.2)

## 2020-06-30 LAB — COMPREHENSIVE METABOLIC PANEL
ALT: 14 U/L (ref 0–44)
AST: 21 U/L (ref 15–41)
Albumin: 3.8 g/dL (ref 3.5–5.0)
Alkaline Phosphatase: 55 U/L (ref 38–126)
Anion gap: 10 (ref 5–15)
BUN: 18 mg/dL (ref 8–23)
CO2: 33 mmol/L — ABNORMAL HIGH (ref 22–32)
Calcium: 9.4 mg/dL (ref 8.9–10.3)
Chloride: 92 mmol/L — ABNORMAL LOW (ref 98–111)
Creatinine, Ser: 0.9 mg/dL (ref 0.44–1.00)
GFR, Estimated: >60 mL/min (ref >60)
Glucose, Bld: 130 mg/dL — ABNORMAL HIGH (ref 70–99)
Potassium: 3.8 mmol/L (ref 3.5–5.1)
Sodium: 135 mmol/L (ref 135–145)
Total Bilirubin: 0.6 mg/dL (ref 0.3–1.2)
Total Protein: 7.9 g/dL (ref 6.5–8.1)

## 2020-06-30 MED ORDER — LIDOCAINE-PRILOCAINE 2.5-2.5 % EX CREA: TOPICAL_CREAM | CUTANEOUS | 0 refills | Status: DC

## 2020-06-30 MED ORDER — HYDROCOD POLST-CPM POLST ER 10-8 MG/5ML PO SUER: 5.0000 mL | Freq: Every evening | ORAL | 0 refills | Status: DC | PRN

## 2020-06-30 MED ORDER — PROCHLORPERAZINE MALEATE 10 MG PO TABS: 10.0000 mg | ORAL_TABLET | Freq: Four times a day (QID) | ORAL | 1 refills | Status: DC | PRN

## 2020-06-30 MED ORDER — FOLIC ACID 1 MG PO TABS: 1.0000 mg | ORAL_TABLET | Freq: Every day | ORAL | 1 refills | Status: DC

## 2020-06-30 MED ORDER — ONDANSETRON HCL 8 MG PO TABS: ORAL_TABLET | ORAL | 1 refills | Status: AC

## 2020-06-30 NOTE — Assessment & Plan Note (Addendum)
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  Locally advanced lung cancer-unfortunately this cannot be cured treatments are palliative; and not curative.  #I had a long discussion with patient regarding the staging pathology.  Recommend foundation 1 testing.  # Discussed in general the treatment options for advanced lung cancer include chemotherapy/immunotherapy/ targeted therapy as part of treatment plan.  Surgery is not an option.  Radiation therapy currently not recommended.  Plan brain MRI given the risk of metastasis.  #I would recommend starting her with carboplatin and Alimta chemotherapy ASAP [5/11];Discussed the potential side effects including but not limited to-increasing fatigue, nausea vomiting, diarrhea, hair loss, sores in the mouth, increase risk of infection and also neuropathy.  Recommend G66 injection/folic acid.  Hold Keytruda cycle #1-pending NGS testing.  As patient is a non-smoker-patient stands a chance for targeted therapies.  # DM- 140 [FBS- 140] recommend monitoring blood sugars closely while on chemotherapy status post.  #Cough secondary to malignancy-recommend Tussionex nightly as needed.  # Chemotherapy education; port placement. Hopefully the planned start chemotherapy next week. Antiemetics-Zofran and Compazine; EMLA cream sent to pharmacy; folic acid  Thank you Dr.Fleming for allowing me to participate in the care of your pleasant patient. Please do not hesitate to contact me with questions or concerns in the interim.  # DISPOSITION: # labs today- cbc/cmp/cea # chemo education [Carbo-alimta]; B12 injection # MRI brain ASAP # follow up on 5/11-[ok to double book]- MD; carbo-alimta- Dr.B  # I reviewed the blood work- with the patient in detail; also reviewed the imaging independently [as summarized above]; and with the patient in detail.

## 2020-06-30 NOTE — Progress Notes (Signed)
START ON PATHWAY REGIMEN - Non-Small Cell Lung     A cycle is every 21 days:     Pemetrexed      Carboplatin   **Always confirm dose/schedule in your pharmacy ordering system**  Patient Characteristics: Stage IV Metastatic, Nonsquamous, Awaiting Molecular Test Results and Need to Start Chemotherapy, PS = 0, 1 Therapeutic Status: Stage IV Metastatic Histology: Nonsquamous Cell Broad Molecular Profiling Status: Awaiting Molecular Test Results and Need to Start Chemotherapy ECOG Performance Status: 1 Intent of Therapy: Non-Curative / Palliative Intent, Discussed with Patient

## 2020-06-30 NOTE — Progress Notes (Signed)
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  [Dr.Byrnett & Dr.Fleming]  # MAY 11th, 2022- carbo-alimta  # NGS/MOLECULAR TESTS:    # PALLIATIVE CARE EVALUATION:  # PAIN MANAGEMENT:    DIAGNOSIS: Lung cancer  STAGE:   IIIC      ;  GOALS:Palliatve  CURRENT/MOST RECENT THERAPY : Carbo Alimta      Primary cancer of right upper lobe of lung ([place])  06/30/2020 Initial Diagnosis   Primary cancer of right upper lobe of lung ([place])   06/30/2020 Cancer Staging   Staging form: Lung, AJCC 8th Edition - Clinical: Stage IIIC (cT4, cN3, cM0) - Signed by Cammie Sickle, MD on 06/30/2020 Histopathologic type: Adenocarcinoma, NOS   07/05/2020 -  Chemotherapy    Patient is on Treatment Plan: LUNG CARBOPLATIN / PEMETREXED / PEMBROLIZUMAB Q21D INDUCTION X 4 CYCLES / MAINTENANCE PEMETREXED + PEMBROLIZUMAB         HISTORY OF PRESENTING ILLNESS:  Megan Shields 75 y.o.  female NO history of smoking is here for further evaluation and recommendations for new diagnosis of lung cancer.  Patient states that in December 2021 noted to have a cold; later diagnosed with pneumonia on chest x-ray.  However given lack of residual symptoms patient went on to have further evaluation with Dr. Raul Del.  CT scan showed right upper lobe dominant lung mass; bilateral lung nodules lymphangitic spread; bilateral supraclavicular lymph node on right more than left.  Patient went on to have a biopsy with Dr. Bary Castilla.  Patient has had rapid progression of symptoms in the last  few weeks-to a point that she has been needing 2 L of nasal cannula oxygen.  Patient continues to have shortness of breath on exertion.  She also complains of incessant cough not improved on Tessalon Perles.  She denies any significant weight loss.  Complains of fatigue.  Denies any headaches.  Review of Systems  Constitutional: Positive for malaise/fatigue. Negative for chills, diaphoresis, fever and weight loss.  HENT: Negative for nosebleeds and sore throat.   Eyes: Negative for double vision.  Respiratory: Positive for cough and shortness of breath. Negative for hemoptysis, sputum production and wheezing.   Cardiovascular: Negative for chest pain, palpitations, orthopnea and leg swelling.  Gastrointestinal: Negative for abdominal pain, blood in stool, constipation, diarrhea, heartburn, melena, nausea and vomiting.  Genitourinary: Negative for dysuria, frequency and urgency.  Musculoskeletal: Negative for back pain and joint pain.  Skin: Negative.  Negative for itching and rash.  Neurological: Negative for dizziness, tingling, focal weakness, weakness and headaches.  Endo/Heme/Allergies: Does not bruise/bleed easily.  Psychiatric/Behavioral: Negative for depression. The patient is not nervous/anxious and does not have insomnia.      MEDICAL HISTORY:  Past Medical History:  Diagnosis Date  . Arthritis   . Diabetes mellitus without complication ([place])   . Hypertension   . Obesity   . PONV (postoperative nausea and vomiting)   . Primary cancer of right upper lobe of lung ([place]) 06/30/2020  . Shoulder contusion 11/22/2015    SURGICAL HISTORY: Past Surgical History:  Procedure Laterality Date  . BLEPHAROPLASTY Bilateral 12/19/2017  . CARPAL TUNNEL RELEASE  Bilateral 1985  . CHOLECYSTECTOMY    . COLONOSCOPY WITH PROPOFOL N/A 03/07/2017   Procedure: COLONOSCOPY WITH PROPOFOL;  Surgeon: [name], MD;  Location[facility] ENDOSCOPY;  Service: Gastroenterology;  Laterality: N/A;  . DILATION  AND CURETTAGE OF UTERUS  12/2011   benign endometrial polyp  . HALLUX VALGUS AUSTIN Right 09/02/2014   Procedure: [place];  Surgeon: Samara Deist, DPM;  Location: [facility] ORS;  Service: Podiatry;  Laterality: Right;  . HERNIA REPAIR    . LAPAROSCOPIC GASTRIC BANDING  2010   Lap Band  . LAPAROSCOPIC TOTAL HYSTERECTOMY  12/23/2019  . lymph node removed Right   . TOTAL KNEE ARTHROPLASTY Bilateral 2003, 2004  . TOTAL KNEE ARTHROPLASTY Bilateral   . VENTRAL HERNIA REPAIR  2008    SOCIAL HISTORY: Social History   Socioeconomic History  . Marital status: Married    Spouse name: Not on file  . Number of children: Not on file  . Years of education: Not on file  . Highest education Shields: Not on file  Occupational History  . Not on file  Tobacco Use  . Smoking status: Never Smoker  . Smokeless tobacco: Never Used  Vaping Use  . Vaping Use: Never used  Substance and Sexual Activity  . Alcohol use: No    Alcohol/week: 0.0 standard drinks  . Drug use: No  . Sexual activity: Not on file  Other Topics Concern  . Not on file  Social History Narrative   Never smoked; no alcohol. Lives in [place] with husband [son & daughter]. Home maker.    Social Determinants of Health   Financial Resource Strain: Low Risk   . Difficulty of Paying Living Expenses: Not hard at all  Food Insecurity: No Food Insecurity  . Worried About Charity fundraiser in the Last Year: Never true  . Ran Out of Food in the Last Year: Never true  Transportation Needs: No Transportation Needs  . Lack of Transportation (Medical): No  . Lack of Transportation (Non-Medical): No  Physical Activity: Insufficiently Active  . Days of Exercise per Week: 3 days  . Minutes of Exercise per Session: 30 min  Stress: No Stress Concern Present  . Feeling of Stress : Not at all  Social Connections: Moderately Isolated  . Frequency of Communication with Friends and Family: More than three times a week  . Frequency of  Social Gatherings with Friends and Family: More than three times a week  . Attends Religious Services: Never  . Active Member of Clubs or Organizations: No  . Attends Archivist Meetings: Never  . Marital Status: Married  Human resources officer Violence: Not At Risk  . Fear of Current or Ex-Partner: No  . Emotionally Abused: No  . Physically Abused: No  . Sexually Abused: No    FAMILY HISTORY: Family History  Problem Relation Age of Onset  . Breast cancer Mother 31  . Breast cancer Paternal Aunt        3 pat aunts  . Liver disease Brother        died 68    ALLERGIES:  is allergic to pneumococcal vaccines, codeine, and amoxicillin.  MEDICATIONS:  Current Outpatient Medications  Medication Sig Dispense Refill  . acetaminophen (TYLENOL) 500 MG tablet Take 1,000 mg by mouth at bedtime as needed.    . celecoxib (CELEBREX) 200 MG capsule TAKE 1 CAPSULE DAILY 90 capsule 0  . chlorpheniramine-HYDROcodone (TUSSIONEX) 10-8 MG/5ML SUER Take 5 mLs by mouth at bedtime as  needed for cough. 140 mL 0  . cholecalciferol (VITAMIN D3) 25 MCG (1000 UNIT) tablet Take 1 tablet by mouth daily.    . folic acid (FOLVITE) 1 MG tablet Take 1 tablet (1 mg total) by mouth daily. 90 tablet 1  . FREESTYLE LITE test strip TEST BLOOD SUGAR TWICE A DAY 100 strip 6  . Lancets (FREESTYLE) lancets TEST BLOOD SUGAR TWICE A DAY 100 each 6  . lidocaine-prilocaine (EMLA) cream Apply 30 -45 mins prior to port access. 30 g 0  . metFORMIN (GLUCOPHAGE) 500 MG tablet TAKE 1 TABLET THREE TIMES A DAY AFTER MEALS (Patient taking differently: Take 500 mg by mouth 2 (two) times daily with a meal.) 270 tablet 1  . olmesartan-hydrochlorothiazide (BENICAR HCT) 40-25 MG tablet Take 1 tablet by mouth daily. 90 tablet 3  . ondansetron (ZOFRAN) 8 MG tablet One pill every 8 hours as needed for nausea/vomitting. 40 tablet 1  . prochlorperazine (COMPAZINE) 10 MG tablet Take 1 tablet (10 mg total) by mouth every 6 (six) hours as needed  for nausea or vomiting. 40 tablet 1  . benzonatate (TESSALON) 200 MG capsule Take by mouth.     No current facility-administered medications for this visit.      Marland Kitchen  PHYSICAL EXAMINATION: ECOG PERFORMANCE STATUS: 0 - Asymptomatic  Vitals:   06/30/20 1431  BP: (!) 157/54  Pulse: 68  Resp: 18  Temp: 97.7 F (36.5 C)  SpO2: 99%   Filed Weights   06/30/20 1431  Weight: 290 lb (131.5 kg)    Physical Exam Constitutional:      Comments: Obese.  She is accompanied by husband.  Ambulating independently.  She is on 2 L of nasal cannula oxygen.  HENT:     Head: Normocephalic and atraumatic.     Mouth/Throat:     Pharynx: No oropharyngeal exudate.  Eyes:     Pupils: Pupils are equal, round, and reactive to light.  Cardiovascular:     Rate and Rhythm: Normal rate and regular rhythm.  Pulmonary:     Effort: No respiratory distress.     Breath sounds: No wheezing.     Comments: Bilateral basilar crackles.  Decreased breath sounds.  No wheeze. Abdominal:     General: Bowel sounds are normal. There is no distension.     Palpations: Abdomen is soft. There is no mass.     Tenderness: There is no abdominal tenderness. There is no guarding or rebound.  Musculoskeletal:        General: No tenderness. Normal range of motion.     Cervical back: Normal range of motion and neck supple.  Skin:    General: Skin is warm.  Neurological:     Mental Status: She is alert and oriented to person, place, and time.  Psychiatric:        Mood and Affect: Affect normal.      LABORATORY DATA:  I have reviewed the data as listed Lab Results  Component Value Date   WBC 5.4 06/30/2020   HGB 13.9 06/30/2020   HCT 43.1 06/30/2020   MCV 84.7 06/30/2020   PLT 240 06/30/2020   Recent Labs    10/06/19 0934 06/30/20 1538  NA 140 135  K 4.6 3.8  CL 98 92*  CO2 27 33*  GLUCOSE 123* 130*  BUN 19 18  CREATININE 0.86 0.90  CALCIUM 9.8 9.4  GFRNONAA 68 >60  GFRAA 79  --   PROT 7.0 7.9   ALBUMIN 3.8 3.8  AST 18 21  ALT 13 14  ALKPHOS 61 55  BILITOT 0.4 0.6    RADIOGRAPHIC STUDIES: I have personally reviewed the radiological images as listed and agreed with the findings in the report. NM PET Image Initial (PI) Skull Base To Thigh  Result Date: 06/13/2020 CLINICAL DATA:  Initial treatment strategy for right upper lobe mass. EXAM: NUCLEAR MEDICINE PET SKULL BASE TO THIGH TECHNIQUE: 14.9 mCi F-18 FDG was injected intravenously. Full-ring PET imaging was performed from the skull base to thigh after the radiotracer. CT data was obtained and used for attenuation correction and anatomic localization. Fasting blood glucose: 135 mg/dl COMPARISON:  CT chest dated 05/23/2020 FINDINGS: Mediastinal blood pool activity: SUV max 4.0 Liver activity: SUV max NA NECK: 9 mm short axis right posterior cervical node (series 3/image 52), max SUV 4.0. Clustered left supraclavicular nodes measuring up to 9 mm short axis (series 3/image 62), max SUV 8.4. Incidental CT findings: Thyromegaly, without focal hypermetabolism. CHEST: 2.8 x 3.5 cm right upper lobe mass (series 3/image 83), max SUV 8.4, suspicious for primary bronchogenic neoplasm. Innumerable subcentimeter pulmonary nodules throughout all lobes, suspicious for metastases. Thoracic lymphadenopathy, including: --15 mm short axis low right paratracheal node (series 3/image 89), max SUV 7.8 --16 mm short axis subcarinal node (series 3/image 100), max SUV 6.8 Incidental CT findings: Mild atherosclerotic calcifications of the aortic arch. ABDOMEN/PELVIS: No abnormal hypermetabolism in the liver, spleen, pancreas, or adrenal glands. 14 mm right adrenal nodule (series 3/image 137), non FDG avid and unchanged from remote priors, compatible with a benign adrenal adenoma. No hypermetabolic abdominopelvic lymphadenopathy. Incidental CT findings: Postsurgical changes related to prior laparoscopic gastric band. Mild hepatic steatosis. Status post cholecystectomy.  Atherosclerotic calcifications of the abdominal aorta and branch vessels. Status post cholecystectomy. SKELETON: No focal hypermetabolic activity to suggest skeletal metastasis. Incidental CT findings: Degenerative changes of the visualized thoracolumbar spine. IMPRESSION: 3.5 cm right upper lobe mass, compatible with primary bronchogenic neoplasm. Innumerable pulmonary metastases. Lower cervical, supraclavicular, and thoracic nodal metastases, as above. No evidence of metastatic disease in the abdomen/pelvis. Benign right adrenal adenoma. Electronically Signed   By: Julian Hy M.D.   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  Locally advanced lung cancer-unfortunately this cannot be cured treatments are palliative; and not curative.  #I had a long discussion with patient regarding the staging pathology.  Recommend foundation 1 testing.  # Discussed in general the treatment options for advanced lung cancer include chemotherapy/immunotherapy/ targeted therapy as part of treatment plan.  Surgery is not an option.  Radiation therapy currently not recommended.  Plan brain MRI given the risk of metastasis.  #I would recommend starting her with carboplatin and Alimta chemotherapy ASAP [5/11];Discussed the potential side effects including but not limited to-increasing fatigue, nausea vomiting, diarrhea, hair loss, sores in the mouth, increase risk of infection and also neuropathy.  Recommend D62 injection/folic acid.  Hold Keytruda cycle #1-pending NGS testing.  As patient is a non-smoker-patient stands a chance for targeted therapies.  # DM- 140 [FBS- 140] recommend monitoring blood sugars closely while on chemotherapy status post.  #Cough secondary to malignancy-recommend Tussionex nightly as needed.  # Chemotherapy education; port  placement. Hopefully the planned start chemotherapy next week. Antiemetics-Zofran and Compazine; EMLA cream sent to pharmacy; folic acid  Thank you Dr.Fleming for allowing me to participate in the care  of your pleasant patient. Please do not hesitate to contact me with questions or concerns in the interim.  # DISPOSITION: # labs today- cbc/cmp/cea # chemo education [Carbo-alimta]; B12 injection # MRI brain ASAP # follow up on 5/11-[ok to double book]- MD; carbo-alimta- Dr.B  # I reviewed the blood work- with the patient in detail; also reviewed the imaging independently [as summarized above]; and with the patient in detail.      All questions were answered. The patient knows to call the clinic with any problems, questions or concerns.       Cammie Sickle, MD 06/30/2020 4:32 PM

## 2020-06-30 NOTE — Progress Notes (Signed)
  Oncology Nurse Navigator Documentation  Navigator Location: [facility]Med Onc (06/30/20 1600)   )Navigator Encounter Type: Clinic/MDC (06/30/20 1600)               Multidisiplinary Clinic Date: 06/30/20 (06/30/20 1600) Multidisiplinary Clinic Type: Thoracic (06/30/20 1600)   Patient Visit Type: MedOnc (06/30/20 1600) Treatment Phase: Pre-Tx/Tx Discussion (06/30/20 1600) Barriers/Navigation Needs: Coordination of Care;Education (06/30/20 1600) Education: Newly Diagnosed Cancer Education;Understanding Cancer/ Treatment Options (06/30/20 1600) Interventions: Coordination of Care (06/30/20 1600)   Coordination of Care: Appts;Chemo;Radiology (06/30/20 1600)        Acuity: Level 2-Minimal Needs (1-2 Barriers Identified) (06/30/20 1600)    met with patient and her husband during initial consult with Dr. Rogue Bussing to discuss recent pathology results and next steps. All questions answered during visit. Pt given resources regarding diagnosis and supportive services available. Reviewed upcoming appts. Contact info given and instructed to call with any further questions or needs. Pt verbalized understanding. Nothing further needed at this time.     Time Spent with Patient: 60 (06/30/20 1600)

## 2020-07-01 LAB — CEA: CEA: 516 ng/mL — ABNORMAL HIGH (ref 0.0–4.7)

## 2020-07-03 ENCOUNTER — Inpatient Hospital Stay: Payer: Medicare Other

## 2020-07-03 ENCOUNTER — Other Ambulatory Visit: Payer: Self-pay

## 2020-07-03 DIAGNOSIS — C770 Secondary and unspecified malignant neoplasm of lymph nodes of head, face and neck: Secondary | ICD-10-CM | POA: Diagnosis not present

## 2020-07-03 DIAGNOSIS — C3411 Malignant neoplasm of upper lobe, right bronchus or lung: Secondary | ICD-10-CM | POA: Diagnosis not present

## 2020-07-03 DIAGNOSIS — E1165 Type 2 diabetes mellitus with hyperglycemia: Secondary | ICD-10-CM | POA: Diagnosis not present

## 2020-07-03 DIAGNOSIS — D701 Agranulocytosis secondary to cancer chemotherapy: Secondary | ICD-10-CM | POA: Diagnosis not present

## 2020-07-03 DIAGNOSIS — T451X5A Adverse effect of antineoplastic and immunosuppressive drugs, initial encounter: Secondary | ICD-10-CM | POA: Diagnosis not present

## 2020-07-03 DIAGNOSIS — Z5111 Encounter for antineoplastic chemotherapy: Secondary | ICD-10-CM | POA: Diagnosis not present

## 2020-07-03 MED ORDER — CYANOCOBALAMIN 1000 MCG/ML IJ SOLN
1000.0000 ug | Freq: Once | INTRAMUSCULAR | Status: AC
  Administered 2020-07-03: 1000 ug via INTRAMUSCULAR
  Filled 2020-07-03: qty 1

## 2020-07-03 NOTE — Research (Signed)
Trial:  Megan Shields Patient Megan Shields was identified by Jeral Fruit, RN as a potential candidate for the above listed study.  This Clinical Research Nurse met with Megan Shields, [id number], on 07/03/20 in a manner and location that ensures patient privacy to discuss participation in the above listed research study.  Patient is Accompanied by spouse.  A copy of the informed consent document and separate HIPAA Authorization was provided to the patient.  Patient reads, speaks, and understands [place].   Patient was provided with the business card of this Nurse and encouraged to contact the research team with any questions.  Approximately 15 minutes were spent with the patient reviewing the informed consent documents.  Patient was provided the option of taking informed consent documents home to review and was encouraged to review at their convenience with their support network, including other care providers. Patient took the consent documents home to review. The research nurse will call her next week to ascertain her interest in participating in the protocol. Jeral Fruit, RN 07/03/20 3:51 PM

## 2020-07-03 NOTE — Patient Instructions (Signed)
Pembrolizumab injection What is this medicine? PEMBROLIZUMAB (pem broe liz ue mab) is a monoclonal antibody. It is used to treat certain types of cancer. This medicine may be used for other purposes; ask your health care provider or pharmacist if you have questions. COMMON BRAND NAME(S): Keytruda What should I tell my health care provider before I take this medicine? They need to know if you have any of these conditions:  autoimmune diseases like Crohn's disease, ulcerative colitis, or lupus  have had or planning to have an allogeneic stem cell transplant (uses someone else's stem cells)  history of organ transplant  history of chest radiation  nervous system problems like myasthenia gravis or Guillain-Barre syndrome  an unusual or allergic reaction to pembrolizumab, other medicines, foods, dyes, or preservatives  pregnant or trying to get pregnant  breast-feeding How should I use this medicine? This medicine is for infusion into a vein. It is given by a health care professional in a hospital or clinic setting. A special MedGuide will be given to you before each treatment. Be sure to read this information carefully each time. Talk to your pediatrician regarding the use of this medicine in children. While this drug may be prescribed for children as young as 6 months for selected conditions, precautions do apply. Overdosage: If you think you have taken too much of this medicine contact a poison control center or emergency room at once. NOTE: This medicine is only for you. Do not share this medicine with others. What if I miss a dose? It is important not to miss your dose. Call your doctor or health care professional if you are unable to keep an appointment. What may interact with this medicine? Interactions have not been studied. This list may not describe all possible interactions. Give your health care provider a list of all the medicines, herbs, non-prescription drugs, or dietary  supplements you use. Also tell them if you smoke, drink alcohol, or use illegal drugs. Some items may interact with your medicine. What should I watch for while using this medicine? Your condition will be monitored carefully while you are receiving this medicine. You may need blood work done while you are taking this medicine. Do not become pregnant while taking this medicine or for 4 months after stopping it. Women should inform their doctor if they wish to become pregnant or think they might be pregnant. There is a potential for serious side effects to an unborn child. Talk to your health care professional or pharmacist for more information. Do not breast-feed an infant while taking this medicine or for 4 months after the last dose. What side effects may I notice from receiving this medicine? Side effects that you should report to your doctor or health care professional as soon as possible:  allergic reactions like skin rash, itching or hives, swelling of the face, lips, or tongue  bloody or black, tarry  breathing problems  changes in vision  chest pain  chills  confusion  constipation  cough  diarrhea  dizziness or feeling faint or lightheaded  fast or irregular heartbeat  fever  flushing  joint pain  low blood counts - this medicine may decrease the number of white blood cells, red blood cells and platelets. You may be at increased risk for infections and bleeding.  muscle pain  muscle weakness  pain, tingling, numbness in the hands or feet  persistent headache  redness, blistering, peeling or loosening of the skin, including inside the mouth  signs and   symptoms of high blood sugar such as dizziness; dry mouth; dry skin; fruity breath; nausea; stomach pain; increased hunger or thirst; increased urination  signs and symptoms of kidney injury like trouble passing urine or change in the amount of urine  signs and symptoms of liver injury like dark urine,  light-colored stools, loss of appetite, nausea, right upper belly pain, yellowing of the eyes or skin  sweating  swollen lymph nodes  weight loss Side effects that usually do not require medical attention (report to your doctor or health care professional if they continue or are bothersome):  decreased appetite  hair loss  tiredness This list may not describe all possible side effects. Call your doctor for medical advice about side effects. You may report side effects to FDA at 1-800-FDA-1088. Where should I keep my medicine? This drug is given in a hospital or clinic and will not be stored at home. NOTE: This sheet is a summary. It may not cover all possible information. If you have questions about this medicine, talk to your doctor, pharmacist, or health care provider.  2021 Elsevier/Gold Standard (2019-01-13 21:44:53) Pemetrexed injection What is this medicine? PEMETREXED (PEM e TREX ed) is a chemotherapy drug used to treat lung cancers like non-small cell lung cancer and mesothelioma. It may also be used to treat other cancers. This medicine may be used for other purposes; ask your health care provider or pharmacist if you have questions. COMMON BRAND NAME(S): Alimta What should I tell my health care provider before I take this medicine? They need to know if you have any of these conditions:  infection (especially a virus infection such as chickenpox, cold sores, or herpes)  kidney disease  low blood counts, like low white cell, platelet, or red cell counts  lung or breathing disease, like asthma  radiation therapy  an unusual or allergic reaction to pemetrexed, other medicines, foods, dyes, or preservative  pregnant or trying to get pregnant  breast-feeding How should I use this medicine? This drug is given as an infusion into a vein. It is administered in a hospital or clinic by a specially trained health care professional. Talk to your pediatrician regarding the  use of this medicine in children. Special care may be needed. Overdosage: If you think you have taken too much of this medicine contact a poison control center or emergency room at once. NOTE: This medicine is only for you. Do not share this medicine with others. What if I miss a dose? It is important not to miss your dose. Call your doctor or health care professional if you are unable to keep an appointment. What may interact with this medicine? This medicine may interact with the following medications:  Ibuprofen This list may not describe all possible interactions. Give your health care provider a list of all the medicines, herbs, non-prescription drugs, or dietary supplements you use. Also tell them if you smoke, drink alcohol, or use illegal drugs. Some items may interact with your medicine. What should I watch for while using this medicine? Visit your doctor for checks on your progress. This drug may make you feel generally unwell. This is not uncommon, as chemotherapy can affect healthy cells as well as cancer cells. Report any side effects. Continue your course of treatment even though you feel ill unless your doctor tells you to stop. In some cases, you may be given additional medicines to help with side effects. Follow all directions for their use. Call your doctor or health care   professional for advice if you get a fever, chills or sore throat, or other symptoms of a cold or flu. Do not treat yourself. This drug decreases your body's ability to fight infections. Try to avoid being around people who are sick. This medicine may increase your risk to bruise or bleed. Call your doctor or health care professional if you notice any unusual bleeding. Be careful brushing and flossing your teeth or using a toothpick because you may get an infection or bleed more easily. If you have any dental work done, tell your dentist you are receiving this medicine. Avoid taking products that contain aspirin,  acetaminophen, ibuprofen, naproxen, or ketoprofen unless instructed by your doctor. These medicines may hide a fever. Call your doctor or health care professional if you get diarrhea or mouth sores. Do not treat yourself. To protect your kidneys, drink water or other fluids as directed while you are taking this medicine. Do not become pregnant while taking this medicine or for 6 months after stopping it. Women should inform their doctor if they wish to become pregnant or think they might be pregnant. Men should not father a child while taking this medicine and for 3 months after stopping it. This may interfere with the ability to father a child. You should talk to your doctor or health care professional if you are concerned about your fertility. There is a potential for serious side effects to an unborn child. Talk to your health care professional or pharmacist for more information. Do not breast-feed an infant while taking this medicine or for 1 week after stopping it. What side effects may I notice from receiving this medicine? Side effects that you should report to your doctor or health care professional as soon as possible:  allergic reactions like skin rash, itching or hives, swelling of the face, lips, or tongue  breathing problems  redness, blistering, peeling or loosening of the skin, including inside the mouth  signs and symptoms of bleeding such as bloody or black, tarry stools; red or dark-brown urine; spitting up blood or brown material that looks like coffee grounds; red spots on the skin; unusual bruising or bleeding from the eye, gums, or nose  signs and symptoms of infection like fever or chills; cough; sore throat; pain or trouble passing urine  signs and symptoms of kidney injury like trouble passing urine or change in the amount of urine  signs and symptoms of liver injury like dark yellow or brown urine; general ill feeling or flu-like symptoms; light-colored stools; loss of  appetite; nausea; right upper belly pain; unusually weak or tired; yellowing of the eyes or skin Side effects that usually do not require medical attention (report to your doctor or health care professional if they continue or are bothersome):  constipation  mouth sores  nausea, vomiting  unusually weak or tired This list may not describe all possible side effects. Call your doctor for medical advice about side effects. You may report side effects to FDA at 1-800-FDA-1088. Where should I keep my medicine? This drug is given in a hospital or clinic and will not be stored at home. NOTE: This sheet is a summary. It may not cover all possible information. If you have questions about this medicine, talk to your doctor, pharmacist, or health care provider.  2021 Elsevier/Gold Standard (2017-04-02 16:11:33) Carboplatin injection What is this medicine? CARBOPLATIN (KAR boe pla tin) is a chemotherapy drug. It targets fast dividing cells, like cancer cells, and causes these cells to   die. This medicine is used to treat ovarian cancer and many other cancers. This medicine may be used for other purposes; ask your health care provider or pharmacist if you have questions. COMMON BRAND NAME(S): Paraplatin What should I tell my health care provider before I take this medicine? They need to know if you have any of these conditions:  blood disorders  hearing problems  kidney disease  recent or ongoing radiation therapy  an unusual or allergic reaction to carboplatin, cisplatin, other chemotherapy, other medicines, foods, dyes, or preservatives  pregnant or trying to get pregnant  breast-feeding How should I use this medicine? This drug is usually given as an infusion into a vein. It is administered in a hospital or clinic by a specially trained health care professional. Talk to your pediatrician regarding the use of this medicine in children. Special care may be needed. Overdosage: If you think  you have taken too much of this medicine contact a poison control center or emergency room at once. NOTE: This medicine is only for you. Do not share this medicine with others. What if I miss a dose? It is important not to miss a dose. Call your doctor or health care professional if you are unable to keep an appointment. What may interact with this medicine?  medicines for seizures  medicines to increase blood counts like filgrastim, pegfilgrastim, sargramostim  some antibiotics like amikacin, gentamicin, neomycin, streptomycin, tobramycin  vaccines Talk to your doctor or health care professional before taking any of these medicines:  acetaminophen  aspirin  ibuprofen  ketoprofen  naproxen This list may not describe all possible interactions. Give your health care provider a list of all the medicines, herbs, non-prescription drugs, or dietary supplements you use. Also tell them if you smoke, drink alcohol, or use illegal drugs. Some items may interact with your medicine. What should I watch for while using this medicine? Your condition will be monitored carefully while you are receiving this medicine. You will need important blood work done while you are taking this medicine. This drug may make you feel generally unwell. This is not uncommon, as chemotherapy can affect healthy cells as well as cancer cells. Report any side effects. Continue your course of treatment even though you feel ill unless your doctor tells you to stop. In some cases, you may be given additional medicines to help with side effects. Follow all directions for their use. Call your doctor or health care professional for advice if you get a fever, chills or sore throat, or other symptoms of a cold or flu. Do not treat yourself. This drug decreases your body's ability to fight infections. Try to avoid being around people who are sick. This medicine may increase your risk to bruise or bleed. Call your doctor or health  care professional if you notice any unusual bleeding. Be careful brushing and flossing your teeth or using a toothpick because you may get an infection or bleed more easily. If you have any dental work done, tell your dentist you are receiving this medicine. Avoid taking products that contain aspirin, acetaminophen, ibuprofen, naproxen, or ketoprofen unless instructed by your doctor. These medicines may hide a fever. Do not become pregnant while taking this medicine. Women should inform their doctor if they wish to become pregnant or think they might be pregnant. There is a potential for serious side effects to an unborn child. Talk to your health care professional or pharmacist for more information. Do not breast-feed an infant while taking   this medicine. What side effects may I notice from receiving this medicine? Side effects that you should report to your doctor or health care professional as soon as possible:  allergic reactions like skin rash, itching or hives, swelling of the face, lips, or tongue  signs of infection - fever or chills, cough, sore throat, pain or difficulty passing urine  signs of decreased platelets or bleeding - bruising, pinpoint red spots on the skin, black, tarry stools, nosebleeds  signs of decreased red blood cells - unusually weak or tired, fainting spells, lightheadedness  breathing problems  changes in hearing  changes in vision  chest pain  high blood pressure  low blood counts - This drug may decrease the number of white blood cells, red blood cells and platelets. You may be at increased risk for infections and bleeding.  nausea and vomiting  pain, swelling, redness or irritation at the injection site  pain, tingling, numbness in the hands or feet  problems with balance, talking, walking  trouble passing urine or change in the amount of urine Side effects that usually do not require medical attention (report to your doctor or health care  professional if they continue or are bothersome):  hair loss  loss of appetite  metallic taste in the mouth or changes in taste This list may not describe all possible side effects. Call your doctor for medical advice about side effects. You may report side effects to FDA at 1-800-FDA-1088. Where should I keep my medicine? This drug is given in a hospital or clinic and will not be stored at home. NOTE: This sheet is a summary. It may not cover all possible information. If you have questions about this medicine, talk to your doctor, pharmacist, or health care provider.  2021 Elsevier/Gold Standard (2007-05-19 14:38:05)  

## 2020-07-04 ENCOUNTER — Other Ambulatory Visit: Payer: Self-pay

## 2020-07-04 ENCOUNTER — Ambulatory Visit
Admission: RE | Admit: 2020-07-04 | Discharge: 2020-07-04 | Disposition: A | Discharge status: Home / Self Care | Payer: Medicare Other | Source: Ambulatory Visit | Attending: Internal Medicine | Admitting: Internal Medicine

## 2020-07-04 ENCOUNTER — Other Ambulatory Visit: Payer: Self-pay | Admitting: General Surgery

## 2020-07-04 DIAGNOSIS — C3490 Malignant neoplasm of unspecified part of unspecified bronchus or lung: Secondary | ICD-10-CM | POA: Insufficient documentation

## 2020-07-04 DIAGNOSIS — I6782 Cerebral ischemia: Secondary | ICD-10-CM | POA: Diagnosis not present

## 2020-07-04 MED ORDER — GADOBUTROL 1 MMOL/ML IV SOLN
10.0000 mL | Freq: Once | INTRAVENOUS | Status: AC | PRN
  Administered 2020-07-04: 10 mL via INTRAVENOUS

## 2020-07-05 ENCOUNTER — Other Ambulatory Visit: Payer: Medicare Other

## 2020-07-05 ENCOUNTER — Inpatient Hospital Stay: Payer: Medicare Other

## 2020-07-05 ENCOUNTER — Encounter: Payer: Self-pay | Admitting: Internal Medicine

## 2020-07-05 ENCOUNTER — Telehealth: Payer: Self-pay | Admitting: *Deleted

## 2020-07-05 ENCOUNTER — Encounter: Payer: Self-pay | Admitting: *Deleted

## 2020-07-05 ENCOUNTER — Inpatient Hospital Stay (HOSPITAL_BASED_OUTPATIENT_CLINIC_OR_DEPARTMENT_OTHER): Payer: Medicare Other | Admitting: Internal Medicine

## 2020-07-05 VITALS — BP 127/82 | HR 65 | Resp 16

## 2020-07-05 DIAGNOSIS — E1165 Type 2 diabetes mellitus with hyperglycemia: Secondary | ICD-10-CM | POA: Diagnosis not present

## 2020-07-05 DIAGNOSIS — C770 Secondary and unspecified malignant neoplasm of lymph nodes of head, face and neck: Secondary | ICD-10-CM | POA: Diagnosis not present

## 2020-07-05 DIAGNOSIS — Z5111 Encounter for antineoplastic chemotherapy: Secondary | ICD-10-CM | POA: Diagnosis not present

## 2020-07-05 DIAGNOSIS — C3411 Malignant neoplasm of upper lobe, right bronchus or lung: Secondary | ICD-10-CM

## 2020-07-05 DIAGNOSIS — D701 Agranulocytosis secondary to cancer chemotherapy: Secondary | ICD-10-CM | POA: Diagnosis not present

## 2020-07-05 DIAGNOSIS — T451X5A Adverse effect of antineoplastic and immunosuppressive drugs, initial encounter: Secondary | ICD-10-CM | POA: Diagnosis not present

## 2020-07-05 MED ORDER — SODIUM CHLORIDE 0.9 % IV SOLN
10.0000 mg | Freq: Once | INTRAVENOUS | Status: AC
  Administered 2020-07-05: 10 mg via INTRAVENOUS
  Filled 2020-07-05: qty 10

## 2020-07-05 MED ORDER — GLIPIZIDE 10 MG PO TABS: 10.0000 mg | ORAL_TABLET | Freq: Every day | ORAL | 0 refills | Status: DC

## 2020-07-05 MED ORDER — GLIPIZIDE 10 MG PO TABS: 10.0000 mg | ORAL_TABLET | Freq: Every day | ORAL | 1 refills | Status: DC

## 2020-07-05 MED ORDER — SODIUM CHLORIDE 0.9 % IV SOLN
150.0000 mg | Freq: Once | INTRAVENOUS | Status: AC
  Administered 2020-07-05: 150 mg via INTRAVENOUS
  Filled 2020-07-05: qty 150

## 2020-07-05 MED ORDER — SODIUM CHLORIDE 0.9 % IV SOLN
500.0000 mg/m2 | Freq: Once | INTRAVENOUS | Status: AC
  Administered 2020-07-05: 1200 mg via INTRAVENOUS
  Filled 2020-07-05: qty 40

## 2020-07-05 MED ORDER — PALONOSETRON HCL INJECTION 0.25 MG/5ML
0.2500 mg | Freq: Once | INTRAVENOUS | Status: AC
  Administered 2020-07-05: 0.25 mg via INTRAVENOUS
  Filled 2020-07-05: qty 5

## 2020-07-05 MED ORDER — SODIUM CHLORIDE 0.9 % IV SOLN
Freq: Once | INTRAVENOUS | Status: AC
  Filled 2020-07-05: qty 250

## 2020-07-05 MED ORDER — SODIUM CHLORIDE 0.9 % IV SOLN
653.0000 mg | Freq: Once | INTRAVENOUS | Status: AC
  Administered 2020-07-05: 650 mg via INTRAVENOUS
  Filled 2020-07-05: qty 65

## 2020-07-05 NOTE — Progress Notes (Signed)
  Oncology Nurse Navigator Documentation  Navigator Location: [facility] (07/05/20 1300)   )Navigator Encounter Type: Follow-up Appt;Treatment (07/05/20 1300)                   Treatment Initiated Date: 07/05/20 (07/05/20 1300) Patient Visit Type: MedOnc (07/05/20 1300) Treatment Phase: First Chemo Tx (07/05/20 1300) Barriers/Navigation Needs: No Barriers At This Time;No Needs (07/05/20 1300)   Interventions: None Required (07/05/20 1300)         met with patient and her husband prior to starting chemotherapy treatments today. All questions answered during visit. Pt informed that will be given scheduled follow up appts during her infusion today. Instructed to call with any questions or needs. Pt verbalized understanding. Nothing further needed at this time.             Time Spent with Patient: 30 (07/05/20 1300)

## 2020-07-05 NOTE — Assessment & Plan Note (Addendum)
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  Locally advanced lung cancer.  Reiterated the treatments are palliative not curative.  Awaiting foundation 1.  MRI brain done o n5/10-awaiting results.  #Given the symptomatic disease-proceed with carboplatin Alimta cycle 1 today. [hold dex premed-] Hold Keytruda for cycle 1.  J51 folic acid. Labs today reviewed;  acceptable for treatment today.   # DM- 140 [FBS- 140] ; metformin- diarrhea; start glipizide 10 mg AC; [goal- PP- 250]  #Cough secondary to malignancy-recommend Tussionex nightly as needed.  #IV access: Awaiting port placement on 5/27.  # DISPOSITION:.Marland Kitchen  #Carbo Alimta chemotherapy today; # in 1 week- labs- cbc/cmp; possible IVFs over 1 hour # follow up in 3 weeks- MD; labs-cbc/cmp- carbo-alimta-keytruda- Dr.B

## 2020-07-05 NOTE — Patient Instructions (Signed)
[facility]  Discharge Instructions: Thank you for choosing [facility] to provide your oncology and hematology care.  If you have a lab appointment with the [facility], please go directly to the [facility] and check in at the registration area.  Wear comfortable clothing and clothing appropriate for easy access to any Portacath or PICC line.   We strive to give you quality time with your provider. You may need to reschedule your appointment if you arrive late (15 or more minutes).  Arriving late affects you and other patients whose appointments are after yours.  Also, if you miss three or more appointments without notifying the office, you may be dismissed from the clinic at the provider's discretion.      For prescription refill requests, have your pharmacy contact our office and allow 72 hours for refills to be completed.    Today you received the following chemotherapy and/or immunotherapy agents Alimta & Carboplatin Carboplatin injection What is this medicine? CARBOPLATIN (KAR boe pla tin) is a chemotherapy drug. It targets fast dividing cells, like cancer cells, and causes these cells to die. This medicine is used to treat ovarian cancer and many other cancers. This medicine may be used for other purposes; ask your health care provider or pharmacist if you have questions. COMMON BRAND NAME(S): Paraplatin What should I tell my health care provider before I take this medicine? They need to know if you have any of these conditions:  blood disorders  hearing problems  kidney disease  recent or ongoing radiation therapy  an unusual or allergic reaction to carboplatin, cisplatin, other chemotherapy, other medicines, foods, dyes, or preservatives  pregnant or trying to get pregnant  breast-feeding How should I use this medicine? This drug is usually given as an infusion into a vein. It is administered in a hospital or clinic by  a specially trained health care professional. Talk to your pediatrician regarding the use of this medicine in children. Special care may be needed. Overdosage: If you think you have taken too much of this medicine contact a poison control center or emergency room at once. NOTE: This medicine is only for you. Do not share this medicine with others. What if I miss a dose? It is important not to miss a dose. Call your doctor or health care professional if you are unable to keep an appointment. What may interact with this medicine?  medicines for seizures  medicines to increase blood counts like filgrastim, pegfilgrastim, sargramostim  some antibiotics like amikacin, gentamicin, neomycin, streptomycin, tobramycin  vaccines Talk to your doctor or health care professional before taking any of these medicines:  acetaminophen  aspirin  ibuprofen  ketoprofen  naproxen This list may not describe all possible interactions. Give your health care provider a list of all the medicines, herbs, non-prescription drugs, or dietary supplements you use. Also tell them if you smoke, drink alcohol, or use illegal drugs. Some items may interact with your medicine. What should I watch for while using this medicine? Your condition will be monitored carefully while you are receiving this medicine. You will need important blood work done while you are taking this medicine. This drug may make you feel generally unwell. This is not uncommon, as chemotherapy can affect healthy cells as well as cancer cells. Report any side effects. Continue your course of treatment even though you feel ill unless your doctor tells you to stop. In some cases, you may be given additional medicines  to help with side effects. Follow all directions for their use. Call your doctor or health care professional for advice if you get a fever, chills or sore throat, or other symptoms of a cold or flu. Do not treat yourself. This drug decreases  your body's ability to fight infections. Try to avoid being around people who are sick. This medicine may increase your risk to bruise or bleed. Call your doctor or health care professional if you notice any unusual bleeding. Be careful brushing and flossing your teeth or using a toothpick because you may get an infection or bleed more easily. If you have any dental work done, tell your dentist you are receiving this medicine. Avoid taking products that contain aspirin, acetaminophen, ibuprofen, naproxen, or ketoprofen unless instructed by your doctor. These medicines may hide a fever. Do not become pregnant while taking this medicine. Women should inform their doctor if they wish to become pregnant or think they might be pregnant. There is a potential for serious side effects to an unborn child. Talk to your health care professional or pharmacist for more information. Do not breast-feed an infant while taking this medicine. What side effects may I notice from receiving this medicine? Side effects that you should report to your doctor or health care professional as soon as possible:  allergic reactions like skin rash, itching or hives, swelling of the face, lips, or tongue  signs of infection - fever or chills, cough, sore throat, pain or difficulty passing urine  signs of decreased platelets or bleeding - bruising, pinpoint red spots on the skin, black, tarry stools, nosebleeds  signs of decreased red blood cells - unusually weak or tired, fainting spells, lightheadedness  breathing problems  changes in hearing  changes in vision  chest pain  high blood pressure  low blood counts - This drug may decrease the number of white blood cells, red blood cells and platelets. You may be at increased risk for infections and bleeding.  nausea and vomiting  pain, swelling, redness or irritation at the injection site  pain, tingling, numbness in the hands or feet  problems with balance,  talking, walking  trouble passing urine or change in the amount of urine Side effects that usually do not require medical attention (report to your doctor or health care professional if they continue or are bothersome):  hair loss  loss of appetite  metallic taste in the mouth or changes in taste This list may not describe all possible side effects. Call your doctor for medical advice about side effects. You may report side effects to FDA at 1-800-FDA-1088. Where should I keep my medicine? This drug is given in a hospital or clinic and will not be stored at home. NOTE: This sheet is a summary. It may not cover all possible information. If you have questions about this medicine, talk to your doctor, pharmacist, or health care provider.  2021 Elsevier/Gold Standard (2007-05-19 14:38:05) Pemetrexed injection What is this medicine? PEMETREXED (PEM e TREX ed) is a chemotherapy drug used to treat lung cancers like non-small cell lung cancer and mesothelioma. It may also be used to treat other cancers. This medicine may be used for other purposes; ask your health care provider or pharmacist if you have questions. COMMON BRAND NAME(S): Alimta What should I tell my health care provider before I take this medicine? They need to know if you have any of these conditions:  infection (especially a virus infection such as chickenpox, cold sores, or herpes)  kidney disease  low blood counts, like low white cell, platelet, or red cell counts  lung or breathing disease, like asthma  radiation therapy  an unusual or allergic reaction to pemetrexed, other medicines, foods, dyes, or preservative  pregnant or trying to get pregnant  breast-feeding How should I use this medicine? This drug is given as an infusion into a vein. It is administered in a hospital or clinic by a specially trained health care professional. Talk to your pediatrician regarding the use of this medicine in children. Special  care may be needed. Overdosage: If you think you have taken too much of this medicine contact a poison control center or emergency room at once. NOTE: This medicine is only for you. Do not share this medicine with others. What if I miss a dose? It is important not to miss your dose. Call your doctor or health care professional if you are unable to keep an appointment. What may interact with this medicine? This medicine may interact with the following medications:  Ibuprofen This list may not describe all possible interactions. Give your health care provider a list of all the medicines, herbs, non-prescription drugs, or dietary supplements you use. Also tell them if you smoke, drink alcohol, or use illegal drugs. Some items may interact with your medicine. What should I watch for while using this medicine? Visit your doctor for checks on your progress. This drug may make you feel generally unwell. This is not uncommon, as chemotherapy can affect healthy cells as well as cancer cells. Report any side effects. Continue your course of treatment even though you feel ill unless your doctor tells you to stop. In some cases, you may be given additional medicines to help with side effects. Follow all directions for their use. Call your doctor or health care professional for advice if you get a fever, chills or sore throat, or other symptoms of a cold or flu. Do not treat yourself. This drug decreases your body's ability to fight infections. Try to avoid being around people who are sick. This medicine may increase your risk to bruise or bleed. Call your doctor or health care professional if you notice any unusual bleeding. Be careful brushing and flossing your teeth or using a toothpick because you may get an infection or bleed more easily. If you have any dental work done, tell your dentist you are receiving this medicine. Avoid taking products that contain aspirin, acetaminophen, ibuprofen, naproxen, or  ketoprofen unless instructed by your doctor. These medicines may hide a fever. Call your doctor or health care professional if you get diarrhea or mouth sores. Do not treat yourself. To protect your kidneys, drink water or other fluids as directed while you are taking this medicine. Do not become pregnant while taking this medicine or for 6 months after stopping it. Women should inform their doctor if they wish to become pregnant or think they might be pregnant. Men should not father a child while taking this medicine and for 3 months after stopping it. This may interfere with the ability to father a child. You should talk to your doctor or health care professional if you are concerned about your fertility. There is a potential for serious side effects to an unborn child. Talk to your health care professional or pharmacist for more information. Do not breast-feed an infant while taking this medicine or for 1 week after stopping it. What side effects may I notice from receiving this medicine? Side effects that you should  report to your doctor or health care professional as soon as possible:  allergic reactions like skin rash, itching or hives, swelling of the face, lips, or tongue  breathing problems  redness, blistering, peeling or loosening of the skin, including inside the mouth  signs and symptoms of bleeding such as bloody or black, tarry stools; red or dark-brown urine; spitting up blood or brown material that looks like coffee grounds; red spots on the skin; unusual bruising or bleeding from the eye, gums, or nose  signs and symptoms of infection like fever or chills; cough; sore throat; pain or trouble passing urine  signs and symptoms of kidney injury like trouble passing urine or change in the amount of urine  signs and symptoms of liver injury like dark yellow or brown urine; general ill feeling or flu-like symptoms; light-colored stools; loss of appetite; nausea; right upper belly pain;  unusually weak or tired; yellowing of the eyes or skin Side effects that usually do not require medical attention (report to your doctor or health care professional if they continue or are bothersome):  constipation  mouth sores  nausea, vomiting  unusually weak or tired This list may not describe all possible side effects. Call your doctor for medical advice about side effects. You may report side effects to FDA at 1-800-FDA-1088. Where should I keep my medicine? This drug is given in a hospital or clinic and will not be stored at home. NOTE: This sheet is a summary. It may not cover all possible information. If you have questions about this medicine, talk to your doctor, pharmacist, or health care provider.  2021 Elsevier/Gold Standard (2017-04-02 16:11:33)       To help prevent nausea and vomiting after your treatment, we encourage you to take your nausea medication as directed.  BELOW ARE SYMPTOMS THAT SHOULD BE REPORTED IMMEDIATELY: . *FEVER GREATER THAN 100.4 F (38 C) OR HIGHER . *CHILLS OR SWEATING . *NAUSEA AND VOMITING THAT IS NOT CONTROLLED WITH YOUR NAUSEA MEDICATION . *UNUSUAL SHORTNESS OF BREATH . *UNUSUAL BRUISING OR BLEEDING . *URINARY PROBLEMS (pain or burning when urinating, or frequent urination) . *BOWEL PROBLEMS (unusual diarrhea, constipation, pain near the anus) . TENDERNESS IN MOUTH AND THROAT WITH OR WITHOUT PRESENCE OF ULCERS (sore throat, sores in mouth, or a toothache) . UNUSUAL RASH, SWELLING OR PAIN  . UNUSUAL VAGINAL DISCHARGE OR ITCHING   Items with * indicate a potential emergency and should be followed up as soon as possible or go to the Emergency Department if any problems should occur.  Please show the CHEMOTHERAPY ALERT CARD or IMMUNOTHERAPY ALERT CARD at check-in to the Emergency Department and triage nurse.  Should you have questions after your visit or need to cancel or reschedule your appointment, please contact [facility]  [phone] and follow the prompts.  Office hours are 8:00 a.m. to 4:30 p.m. Monday - Friday. Please note that voicemails left after 4:00 p.m. may not be returned until the following business day.  We are closed weekends and major holidays. You have access to a nurse at all times for urgent questions. Please call the main number to the clinic [phone] and follow the prompts.  For any non-urgent questions, you may also contact your provider using MyChart. We now offer e-Visits for anyone 45 and older to request care online for non-urgent symptoms. For details visit [email or web address].   Also download the MyChart app! Go to the app store, search "MyChart", open the app, select  [facility], and log in with your MyChart username and password.  Due to Covid, a mask is required upon entering the hospital/clinic. If you do not have a mask, one will be given to you upon arrival. For doctor visits, patients may have 1 support person aged 55 or older with them. For treatment visits, patients cannot have anyone with them due to current Covid guidelines and our immunocompromised population.

## 2020-07-05 NOTE — Telephone Encounter (Signed)
[facility] is requesting to change quantity of Glipizide to #90 for a 90 day supply.

## 2020-07-05 NOTE — Progress Notes (Signed)
Prefers staying in [place] for all treatments.

## 2020-07-05 NOTE — Telephone Encounter (Signed)
Prescription changed to 90 days supply

## 2020-07-05 NOTE — Progress Notes (Signed)
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  [Dr.Byrnett & Dr.Fleming]  # MAY 11th, 2022- carbo-alimta  # NGS/MOLECULAR TESTS:    # PALLIATIVE CARE EVALUATION:  # PAIN MANAGEMENT:    DIAGNOSIS: Lung cancer  STAGE:   IIIC      ;  GOALS:Palliatve  CURRENT/MOST RECENT THERAPY : Carbo Alimta      Primary cancer of right upper lobe of lung ([place])  06/30/2020 Initial Diagnosis   Primary cancer of right upper lobe of lung ([place])   06/30/2020 Cancer Staging   Staging form: Lung, AJCC 8th Edition - Clinical: Stage IIIC (cT4, cN3, cM0) - Signed by Cammie Sickle, MD on 06/30/2020 Histopathologic type: Adenocarcinoma, NOS   07/05/2020 -  Chemotherapy    Patient is on Treatment Plan: LUNG CARBOPLATIN / PEMETREXED / PEMBROLIZUMAB Q21D INDUCTION X 4 CYCLES / MAINTENANCE PEMETREXED + PEMBROLIZUMAB         HISTORY OF PRESENTING ILLNESS:  Megan ALLBAUGH 75 y.o.  female non-smoker given a diagnosis of adenocarcinoma the lung is here to proceed with chemotherapy.  In the interim patient had brain MRI yesterday.  Patient also underwent chemotherapy education.  Patient continues to have cough.  Complains of fatigue.  She is on 2 L of nasal cannula oxygen.  She denies any worsening shortness of breath.  Review of Systems  Constitutional: Positive for malaise/fatigue. Negative for chills, diaphoresis, fever and weight loss.  HENT: Negative for nosebleeds and sore throat.   Eyes: Negative for double vision.  Respiratory: Positive for cough and  shortness of breath. Negative for hemoptysis, sputum production and wheezing.   Cardiovascular: Negative for chest pain, palpitations, orthopnea and leg swelling.  Gastrointestinal: Negative for abdominal pain, blood in stool, constipation, diarrhea, heartburn, melena, nausea and vomiting.  Genitourinary: Negative for dysuria, frequency and urgency.  Musculoskeletal: Negative for back pain and joint pain.  Skin: Negative.  Negative for itching and rash.  Neurological: Negative for dizziness, tingling, focal weakness, weakness and headaches.  Endo/Heme/Allergies: Does not bruise/bleed easily.  Psychiatric/Behavioral: Negative for depression. The patient is not nervous/anxious and does not have insomnia.      MEDICAL HISTORY:  Past Medical History:  Diagnosis Date  . Arthritis   . Diabetes mellitus without complication ([place])   . Hypertension   . Obesity   . PONV (postoperative nausea and vomiting)   . Primary cancer of right upper lobe of lung ([place]) 06/30/2020  . Shoulder contusion 11/22/2015    SURGICAL HISTORY: Past Surgical History:  Procedure Laterality Date  . BLEPHAROPLASTY Bilateral 12/19/2017  . CARPAL TUNNEL RELEASE Bilateral 1985  . CHOLECYSTECTOMY    . COLONOSCOPY WITH PROPOFOL N/A 03/07/2017   Procedure: COLONOSCOPY WITH PROPOFOL;  Surgeon: [name], MD;  Location: [facility] ENDOSCOPY;  Service: Gastroenterology;  Laterality: N/A;  . DILATION AND CURETTAGE OF UTERUS  12/2011   benign endometrial polyp  . HALLUX VALGUS AUSTIN Right 09/02/2014   Procedure: [place];  Surgeon: Samara Deist, DPM;  Location: [facility] ORS;  Service: Podiatry;  Laterality: Right;  . HERNIA REPAIR    . LAPAROSCOPIC GASTRIC BANDING  2010   Lap Band  .  LAPAROSCOPIC TOTAL HYSTERECTOMY  12/23/2019  . lymph node removed Right   . TOTAL KNEE ARTHROPLASTY Bilateral 2003, 2004  . TOTAL KNEE ARTHROPLASTY Bilateral   . VENTRAL HERNIA REPAIR  2008    SOCIAL HISTORY: Social History    Socioeconomic History  . Marital status: Married    Spouse name: Not on file  . Number of children: Not on file  . Years of education: Not on file  . Highest education level: Not on file  Occupational History  . Not on file  Tobacco Use  . Smoking status: Never Smoker  . Smokeless tobacco: Never Used  Vaping Use  . Vaping Use: Never used  Substance and Sexual Activity  . Alcohol use: No    Alcohol/week: 0.0 standard drinks  . Drug use: No  . Sexual activity: Not on file  Other Topics Concern  . Not on file  Social History Narrative   Never smoked; no alcohol. Lives in [place] with husband [son & daughter]. Home maker.    Social Determinants of Health   Financial Resource Strain: Low Risk   . Difficulty of Paying Living Expenses: Not hard at all  Food Insecurity: No Food Insecurity  . Worried About Charity fundraiser in the Last Year: Never true  . Ran Out of Food in the Last Year: Never true  Transportation Needs: No Transportation Needs  . Lack of Transportation (Medical): No  . Lack of Transportation (Non-Medical): No  Physical Activity: Insufficiently Active  . Days of Exercise per Week: 3 days  . Minutes of Exercise per Session: 30 min  Stress: No Stress Concern Present  . Feeling of Stress : Not at all  Social Connections: Moderately Isolated  . Frequency of Communication with Friends and Family: More than three times a week  . Frequency of Social Gatherings with Friends and Family: More than three times a week  . Attends Religious Services: Never  . Active Member of Clubs or Organizations: No  . Attends Archivist Meetings: Never  . Marital Status: Married  Human resources officer Violence: Not At Risk  . Fear of Current or Ex-Partner: No  . Emotionally Abused: No  . Physically Abused: No  . Sexually Abused: No    FAMILY HISTORY: Family History  Problem Relation Age of Onset  . Breast cancer Mother 59  . Breast cancer Paternal Aunt        3 pat  aunts  . Liver disease Brother        died 55    ALLERGIES:  is allergic to pneumococcal vaccines, codeine, and amoxicillin.  MEDICATIONS:  Current Outpatient Medications  Medication Sig Dispense Refill  . acetaminophen (TYLENOL) 500 MG tablet Take 1,000 mg by mouth at bedtime as needed.    . benzonatate (TESSALON) 200 MG capsule Take by mouth.    . celecoxib (CELEBREX) 200 MG capsule TAKE 1 CAPSULE DAILY 90 capsule 0  . chlorpheniramine-HYDROcodone (TUSSIONEX) 10-8 MG/5ML SUER Take 5 mLs by mouth at bedtime as needed for cough. 140 mL 0  . cholecalciferol (VITAMIN D3) 25 MCG (1000 UNIT) tablet Take 1 tablet by mouth daily.    . folic acid (FOLVITE) 1 MG tablet Take 1 tablet (1 mg total) by mouth daily. 90 tablet 1  . FREESTYLE LITE test strip TEST BLOOD SUGAR TWICE A DAY 100 strip 6  . Lancets (FREESTYLE) lancets TEST BLOOD SUGAR TWICE A DAY 100 each 6  . lidocaine-prilocaine (EMLA) cream Apply 30 -45 mins prior to  port access. 30 g 0  . metFORMIN (GLUCOPHAGE) 500 MG tablet TAKE 1 TABLET THREE TIMES A DAY AFTER MEALS (Patient taking differently: Take 500 mg by mouth 2 (two) times daily with a meal.) 270 tablet 1  . olmesartan-hydrochlorothiazide (BENICAR HCT) 40-25 MG tablet Take 1 tablet by mouth daily. 90 tablet 3  . ondansetron (ZOFRAN) 8 MG tablet One pill every 8 hours as needed for nausea/vomitting. 40 tablet 1  . prochlorperazine (COMPAZINE) 10 MG tablet Take 1 tablet (10 mg total) by mouth every 6 (six) hours as needed for nausea or vomiting. 40 tablet 1  . glipiZIDE (GLUCOTROL) 10 MG tablet Take 1 tablet (10 mg total) by mouth daily before breakfast. 90 tablet 1   No current facility-administered medications for this visit.      Marland Kitchen  PHYSICAL EXAMINATION: ECOG PERFORMANCE STATUS: 0 - Asymptomatic  Vitals:   07/05/20 0831  BP: (!) 102/56  Pulse: 72  Resp: 18  Temp: 98 F (36.7 C)  SpO2: 99%   Filed Weights   07/05/20 0831  Weight: 286 lb (129.7 kg)    Physical  Exam Constitutional:      Comments: Obese.  She is accompanied by husband.  Ambulating independently.  She is on 2 L of nasal cannula oxygen.  HENT:     Head: Normocephalic and atraumatic.     Mouth/Throat:     Pharynx: No oropharyngeal exudate.  Eyes:     Pupils: Pupils are equal, round, and reactive to light.  Cardiovascular:     Rate and Rhythm: Normal rate and regular rhythm.  Pulmonary:     Effort: No respiratory distress.     Breath sounds: No wheezing.     Comments: Bilateral basilar crackles.  Decreased breath sounds.  No wheeze. Abdominal:     General: Bowel sounds are normal. There is no distension.     Palpations: Abdomen is soft. There is no mass.     Tenderness: There is no abdominal tenderness. There is no guarding or rebound.  Musculoskeletal:        General: No tenderness. Normal range of motion.     Cervical back: Normal range of motion and neck supple.  Skin:    General: Skin is warm.  Neurological:     Mental Status: She is alert and oriented to person, place, and time.  Psychiatric:        Mood and Affect: Affect normal.      LABORATORY DATA:  I have reviewed the data as listed Lab Results  Component Value Date   WBC 5.4 06/30/2020   HGB 13.9 06/30/2020   HCT 43.1 06/30/2020   MCV 84.7 06/30/2020   PLT 240 06/30/2020   Recent Labs    10/06/19 0934 06/30/20 1538  NA 140 135  K 4.6 3.8  CL 98 92*  CO2 27 33*  GLUCOSE 123* 130*  BUN 19 18  CREATININE 0.86 0.90  CALCIUM 9.8 9.4  GFRNONAA 68 >60  GFRAA 79  --   PROT 7.0 7.9  ALBUMIN 3.8 3.8  AST 18 21  ALT 13 14  ALKPHOS 61 55  BILITOT 0.4 0.6    RADIOGRAPHIC STUDIES: I have personally reviewed the radiological images as listed and agreed with the findings in the report. NM PET Image Initial (PI) Skull Base To Thigh  Result Date: 06/13/2020 CLINICAL DATA:  Initial treatment strategy for right upper lobe mass. EXAM: NUCLEAR MEDICINE PET SKULL BASE TO THIGH TECHNIQUE: 14.9 mCi F-18 FDG  was injected intravenously. Full-ring PET imaging was performed from the skull base to thigh after the radiotracer. CT data was obtained and used for attenuation correction and anatomic localization. Fasting blood glucose: 135 mg/dl COMPARISON:  CT chest dated 05/23/2020 FINDINGS: Mediastinal blood pool activity: SUV max 4.0 Liver activity: SUV max NA NECK: 9 mm short axis right posterior cervical node (series 3/image 52), max SUV 4.0. Clustered left supraclavicular nodes measuring up to 9 mm short axis (series 3/image 62), max SUV 8.4. Incidental CT findings: Thyromegaly, without focal hypermetabolism. CHEST: 2.8 x 3.5 cm right upper lobe mass (series 3/image 83), max SUV 8.4, suspicious for primary bronchogenic neoplasm. Innumerable subcentimeter pulmonary nodules throughout all lobes, suspicious for metastases. Thoracic lymphadenopathy, including: --15 mm short axis low right paratracheal node (series 3/image 89), max SUV 7.8 --16 mm short axis subcarinal node (series 3/image 100), max SUV 6.8 Incidental CT findings: Mild atherosclerotic calcifications of the aortic arch. ABDOMEN/PELVIS: No abnormal hypermetabolism in the liver, spleen, pancreas, or adrenal glands. 14 mm right adrenal nodule (series 3/image 137), non FDG avid and unchanged from remote priors, compatible with a benign adrenal adenoma. No hypermetabolic abdominopelvic lymphadenopathy. Incidental CT findings: Postsurgical changes related to prior laparoscopic gastric band. Mild hepatic steatosis. Status post cholecystectomy. Atherosclerotic calcifications of the abdominal aorta and branch vessels. Status post cholecystectomy. SKELETON: No focal hypermetabolic activity to suggest skeletal metastasis. Incidental CT findings: Degenerative changes of the visualized thoracolumbar spine. IMPRESSION: 3.5 cm right upper lobe mass, compatible with primary bronchogenic neoplasm. Innumerable pulmonary metastases. Lower cervical, supraclavicular, and thoracic  nodal metastases, as above. No evidence of metastatic disease in the abdomen/pelvis. Benign right adrenal adenoma. Electronically Signed   By: Julian Hy M.D.   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  Locally advanced lung cancer.  Reiterated the treatments are palliative not curative.  Awaiting foundation 1.  MRI brain done o n5/10-awaiting results.  #Given the symptomatic disease-proceed with carboplatin Alimta cycle 1 today. [hold dex premed-] Hold Keytruda for cycle 1.  G86 folic acid. Labs today reviewed;  acceptable for treatment today.   # DM- 140 [FBS- 140] ; metformin- diarrhea; start glipizide 10 mg AC; [goal- PP- 250]  #Cough secondary to malignancy-recommend Tussionex nightly as needed.  #IV access: Awaiting port placement on 5/27.  # DISPOSITION:.Marland Kitchen  #Carbo Alimta chemotherapy today; # in 1 week- labs- cbc/cmp; possible IVFs over 1 hour # follow up in 3 weeks- MD; labs-cbc/cmp- carbo-alimta-keytruda- Dr.B  All questions were answered. The patient knows to call the clinic with any problems, questions or concerns.    Cammie Sickle, MD 07/05/2020 11:50 AM

## 2020-07-05 NOTE — Addendum Note (Signed)
Addended by: Delice Bison E on: 07/05/2020 11:54 AM   Modules accepted: Orders

## 2020-07-06 ENCOUNTER — Telehealth: Payer: Self-pay | Admitting: *Deleted

## 2020-07-06 ENCOUNTER — Telehealth: Payer: Self-pay

## 2020-07-06 NOTE — Telephone Encounter (Signed)
Telephone call to patient for follow up after receiving first infusion.   Patient states infusion went great.  States eating good and drinking plenty of fluids.   Denies any nausea or vomiting.  Encouraged patient to call for any concerns or questions.  Pt states her husband tested positive for Covid and has a call in to our center and waiting for return call.  Informed pt MyChart request has been seen and someone should be calling her back shortly.

## 2020-07-06 NOTE — Telephone Encounter (Signed)
Patient called reporting that her husband has tested positive for COVID and she is asking what she needs to do. Please return her call

## 2020-07-06 NOTE — Telephone Encounter (Signed)
Contacted patient. Her husband started having cold like symptoms last evening. He started not feeling well and took a Claritin. Antihistamine did not help his symptoms. Her husband took a covid test this morning and the home kit was positive. Her husband now has a 40 F fever.  She and her husband both had the covid-19 vaccines + booster.  I asked the patient to go ahead and test now. If positive, she will need to contact our office back for further instructions. I asked the patient to retest in a few days if negative testing or sooner if symptoms develop.  Patient and her husband were encouraged to hydrate. She will try to get tested today and will also attempt to self isolate as best as possible from her husband.    Jenny/Lauren/Josh- pt just treated yesterday for carbo/alimita only.

## 2020-07-06 NOTE — Telephone Encounter (Signed)
Reviewed MRI- IMPRESSION  1. No evidence of intracranial metastatic disease. 2. Mild chronic microvascular ischemic changes of the white matter.

## 2020-07-06 NOTE — Telephone Encounter (Signed)
Contacted patient with MRI brain results. Results demonstrated no evidence of metastatic disease. Patient thanked me for calling her with the test results.

## 2020-07-07 ENCOUNTER — Encounter: Payer: Self-pay | Admitting: Internal Medicine

## 2020-07-07 NOTE — Telephone Encounter (Signed)
Contacted patient to follow-up on phone call from yesterday. Patient states that she took a covid test. Her test was negative. Her husband is improving. She continues to self isolate and will retest on Sunday or sooner if she develops symptoms of covid.

## 2020-07-10 ENCOUNTER — Telehealth: Payer: Self-pay | Admitting: *Deleted

## 2020-07-10 ENCOUNTER — Inpatient Hospital Stay (HOSPITAL_BASED_OUTPATIENT_CLINIC_OR_DEPARTMENT_OTHER): Payer: Medicare Other | Admitting: Nurse Practitioner

## 2020-07-10 DIAGNOSIS — U071 COVID-19: Secondary | ICD-10-CM | POA: Diagnosis not present

## 2020-07-10 NOTE — Progress Notes (Signed)
Virtual Visit Progress Note  [facility]  [phone] [phone]  I connected with Megan Shields on 07/10/20 at 10:00 AM EDT by video enabled telemedicine visit and verified that I am speaking with the correct person using two identifiers.   I discussed the limitations, risks, security and privacy concerns of performing an evaluation and management service by telemedicine and the availability of in-person appointments. I also discussed with the patient that there may be a patient responsible charge related to this service. The patient expressed understanding and agreed to proceed.   Other persons participating in the visit and their role in the encounter: Nelda Severe, Husband  Patient's location: home Provider's location: clinic  Chief Complaint: Covid Positive    Patient Care Team: Glean Hess, MD as PCP - General (Internal Medicine) [facility] (Ophthalmology) Jannet Mantis, MD (Dermatology) Telford Nab, RN as Oncology Nurse Navigator   Name of the patient: Megan Shields  [id number]  May 10, 1945   Date of visit: 07/10/20  Diagnosis- Lung Cancer  Chief complaint/ Reason for visit- Covid Infection  Heme/Onc history:  Oncology History Overview Note  # April-MAY 2022- 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  [Dr.Byrnett & Dr.Fleming]  # MAY 11th, 2022- carbo-alimta  # NGS/MOLECULAR TESTS:    # PALLIATIVE CARE EVALUATION:  # PAIN MANAGEMENT:    DIAGNOSIS: Lung cancer  STAGE:   IIIC      ;  GOALS:Palliatve  CURRENT/MOST RECENT THERAPY : Carbo Alimta      Primary cancer of right upper lobe of lung ([place])  06/30/2020 Initial Diagnosis   Primary cancer of right upper lobe of lung ([place])   06/30/2020 Cancer Staging   Staging form: Lung, AJCC 8th Edition - Clinical: Stage IIIC  (cT4, cN3, cM0) - Signed by Cammie Sickle, MD on 06/30/2020 Histopathologic type: Adenocarcinoma, NOS   07/05/2020 -  Chemotherapy    Patient is on Treatment Plan: LUNG CARBOPLATIN / PEMETREXED / PEMBROLIZUMAB Q21D INDUCTION X 4 CYCLES / MAINTENANCE PEMETREXED + PEMBROLIZUMAB        Interval history- Patient is 76 year old female with past medical history of diabetes, lung cancer, hypertension, who presents to [facility] for new positive covid home test. Her husband developed symptoms and tested positive. She has been asymptomatic but also tested and was surprised that she tested positive. She has mild fatigue which is chronic and unchanged. She has a cough at baseline which is unchanged. She wears oxygen.  She was vaccinated.  Last injection in February 2021.  She has not had boosters. Denies any neurologic complaints. Denies recent fevers or illnesses. Denies any easy bleeding or bruising. Reports good appetite and denies weight loss. Denies chest pain. Denies any nausea, vomiting, constipation, or diarrhea. Denies urinary complaints. Patient offers no further specific complaints today.  Review of systems- Review of Systems  Constitutional: Negative for chills, diaphoresis, fever, malaise/fatigue and weight loss.  HENT: Negative for congestion, ear discharge, ear pain, hearing loss, nosebleeds, sinus pain, sore throat and tinnitus.   Eyes: Negative for blurred vision, double vision, pain and redness.  Respiratory: Negative for cough, hemoptysis, sputum production, shortness of breath, wheezing and stridor.   Cardiovascular: Negative for chest pain, palpitations and leg swelling.  Gastrointestinal: Negative for abdominal pain, blood in stool, constipation, diarrhea, melena, nausea and vomiting.  Genitourinary: Negative for dysuria and urgency.  Musculoskeletal: Negative for back  pain, falls, joint pain and myalgias.  Skin: Negative for itching and rash.  Neurological:  Negative for dizziness, tingling, sensory change, loss of consciousness, weakness and headaches.  Endo/Heme/Allergies: Negative for environmental allergies. Does not bruise/bleed easily.  Psychiatric/Behavioral: Negative for depression. The patient is not nervous/anxious and does not have insomnia.   All other systems reviewed and are negative.     Allergies  Allergen Reactions  . Pneumococcal Vaccines Hives  . Codeine     Other reaction(s): drowsiness  . Amoxicillin Rash    Past Medical History:  Diagnosis Date  . Arthritis   . Diabetes mellitus without complication ([place])   . Hypertension   . Obesity   . PONV (postoperative nausea and vomiting)   . Primary cancer of right upper lobe of lung ([place]) 06/30/2020  . Shoulder contusion 11/22/2015    Past Surgical History:  Procedure Laterality Date  . BLEPHAROPLASTY Bilateral 12/19/2017  . CARPAL TUNNEL RELEASE Bilateral 1985  . CHOLECYSTECTOMY    . COLONOSCOPY WITH PROPOFOL N/A 03/07/2017   Procedure: COLONOSCOPY WITH PROPOFOL;  Surgeon: [name], MD;  [facility] ENDOSCOPY;  Service: Gastroenterology;  Laterality: N/A;  . DILATION AND CURETTAGE OF UTERUS  12/2011   benign endometrial polyp  . HALLUX VALGUS AUSTIN Right 09/02/2014   Procedure: [place];  Surgeon: Samara Deist, DPM;  Location: [facility] ORS;  Service: Podiatry;  Laterality: Right;  . HERNIA REPAIR    . LAPAROSCOPIC GASTRIC BANDING  2010   Lap Band  . LAPAROSCOPIC TOTAL HYSTERECTOMY  12/23/2019  . lymph node removed Right   . TOTAL KNEE ARTHROPLASTY Bilateral 2003, 2004  . TOTAL KNEE ARTHROPLASTY Bilateral   . VENTRAL HERNIA REPAIR  2008    Social History   Socioeconomic History  . Marital status: Married    Spouse name: Not on file  . Number of children: Not on file  . Years of education: Not on file  . Highest education level: Not on file  Occupational History  . Not on file  Tobacco Use  . Smoking status: Never Smoker  . Smokeless  tobacco: Never Used  Vaping Use  . Vaping Use: Never used  Substance and Sexual Activity  . Alcohol use: No    Alcohol/week: 0.0 standard drinks  . Drug use: No  . Sexual activity: Not on file  Other Topics Concern  . Not on file  Social History Narrative   Never smoked; no alcohol. Lives in [place] with husband [son & daughter]. Home maker.    Social Determinants of Health   Financial Resource Strain: Low Risk   . Difficulty of Paying Living Expenses: Not hard at all  Food Insecurity: No Food Insecurity  . Worried About Charity fundraiser in the Last Year: Never true  . Ran Out of Food in the Last Year: Never true  Transportation Needs: No Transportation Needs  . Lack of Transportation (Medical): No  . Lack of Transportation (Non-Medical): No  Physical Activity: Insufficiently Active  . Days of Exercise per Week: 3 days  . Minutes of Exercise per Session: 30 min  Stress: No Stress Concern Present  . Feeling of Stress : Not at all  Social Connections: Moderately Isolated  . Frequency of Communication with Friends and Family: More than three times a week  . Frequency of Social Gatherings with Friends and Family: More than three times a week  . Attends Religious Services: Never  . Active Member of Clubs or Organizations: No  . Attends  Club or Organization Meetings: Never  . Marital Status: Married  Human resources officer Violence: Not At Risk  . Fear of Current or Ex-Partner: No  . Emotionally Abused: No  . Physically Abused: No  . Sexually Abused: No    Family History  Problem Relation Age of Onset  . Breast cancer Mother 61  . Breast cancer Paternal Aunt        3 pat aunts  . Liver disease Brother        died 90     Current Outpatient Medications:  .  acetaminophen (TYLENOL) 500 MG tablet, Take 1,000 mg by mouth at bedtime as needed for moderate pain., Disp: , Rfl:  .  benzonatate (TESSALON) 200 MG capsule, Take 200 mg by mouth 3 (three) times daily as needed for  cough., Disp: , Rfl:  .  celecoxib (CELEBREX) 200 MG capsule, TAKE 1 CAPSULE DAILY (Patient taking differently: Take 200 mg by mouth daily.), Disp: 90 capsule, Rfl: 0 .  chlorpheniramine-HYDROcodone (TUSSIONEX) 10-8 MG/5ML SUER, Take 5 mLs by mouth at bedtime as needed for cough., Disp: 140 mL, Rfl: 0 .  cholecalciferol (VITAMIN D3) 25 MCG (1000 UNIT) tablet, Take 1,000 Units by mouth daily., Disp: , Rfl:  .  folic acid (FOLVITE) 1 MG tablet, Take 1 tablet (1 mg total) by mouth daily., Disp: 90 tablet, Rfl: 1 .  FREESTYLE LITE test strip, TEST BLOOD SUGAR TWICE A DAY, Disp: 100 strip, Rfl: 6 .  glipiZIDE (GLUCOTROL) 10 MG tablet, Take 1 tablet (10 mg total) by mouth daily before breakfast., Disp: 90 tablet, Rfl: 1 .  Lancets (FREESTYLE) lancets, TEST BLOOD SUGAR TWICE A DAY, Disp: 100 each, Rfl: 6 .  lidocaine-prilocaine (EMLA) cream, Apply 30 -45 mins prior to port access., Disp: 30 g, Rfl: 0 .  metFORMIN (GLUCOPHAGE) 500 MG tablet, TAKE 1 TABLET THREE TIMES A DAY AFTER MEALS (Patient not taking: Reported on 07/07/2020), Disp: 270 tablet, Rfl: 1 .  olmesartan-hydrochlorothiazide (BENICAR HCT) 40-25 MG tablet, Take 1 tablet by mouth daily., Disp: 90 tablet, Rfl: 3 .  ondansetron (ZOFRAN) 8 MG tablet, One pill every 8 hours as needed for nausea/vomitting. (Patient taking differently: Take 8 mg by mouth every 8 (eight) hours as needed. One pill every 8 hours as needed for nausea/vomitting.), Disp: 40 tablet, Rfl: 1 .  prochlorperazine (COMPAZINE) 10 MG tablet, Take 1 tablet (10 mg total) by mouth every 6 (six) hours as needed for nausea or vomiting., Disp: 40 tablet, Rfl: 1  Physical exam: Exam limited due to telemedicine  There were no vitals filed for this visit. Physical Exam Constitutional:      General: She is not in acute distress. HENT:     Head: Normocephalic.  Pulmonary:     Effort: No respiratory distress.     Comments: On oxygen Neurological:     Mental Status: She is alert and  oriented to person, place, and time.  Psychiatric:        Mood and Affect: Mood normal.        Behavior: Behavior normal.      CMP Latest Ref Rng & Units 06/30/2020  Glucose 70 - 99 mg/dL 130(H)  BUN 8 - 23 mg/dL 18  Creatinine 0.44 - 1.00 mg/dL 0.90  Sodium 135 - 145 mmol/L 135  Potassium 3.5 - 5.1 mmol/L 3.8  Chloride 98 - 111 mmol/L 92(L)  CO2 22 - 32 mmol/L 33(H)  Calcium 8.9 - 10.3 mg/dL 9.4  Total Protein 6.5 - 8.1 g/dL  7.9  Total Bilirubin 0.3 - 1.2 mg/dL 0.6  Alkaline Phos 38 - 126 U/L 55  AST 15 - 41 U/L 21  ALT 0 - 44 U/L 14   CBC Latest Ref Rng & Units 06/30/2020  WBC 4.0 - 10.5 K/uL 5.4  Hemoglobin 12.0 - 15.0 g/dL 13.9  Hematocrit 36.0 - 46.0 % 43.1  Platelets 150 - 400 K/uL 240    No images are attached to the encounter.  MR Brain W Wo Contrast  Result Date: 07/05/2020 CLINICAL DATA:  Malignant neoplasm of unspecified part of unspecified bronchus or lung. Non-small cell lung cancer, staging. EXAM: MRI HEAD WITHOUT AND WITH CONTRAST TECHNIQUE: Multiplanar, multiecho pulse sequences of the brain and surrounding structures were obtained without and with intravenous contrast. CONTRAST:  13mL GADAVIST GADOBUTROL 1 MMOL/ML IV SOLN COMPARISON:  None. FINDINGS: Brain: No acute infarction, hemorrhage, hydrocephalus, extra-axial collection or mass lesion. Scattered foci of T2 hyperintensity are seen within the white matter of the cerebral hemispheres, nonspecific. No focus of abnormal contrast enhancement. Vascular: Normal flow voids. Skull and upper cervical spine: Normal marrow signal. Sinuses/Orbits: Negative. Other: None. IMPRESSION: 1. No evidence of intracranial metastatic disease. 2. Mild chronic microvascular ischemic changes of the white matter. Electronically Signed   By: Pedro Earls M.D.   On: 07/05/2020 13:11   NM PET Image Initial (PI) Skull Base To Thigh  Result Date: 06/13/2020 CLINICAL DATA:  Initial treatment strategy for right upper lobe mass.  EXAM: NUCLEAR MEDICINE PET SKULL BASE TO THIGH TECHNIQUE: 14.9 mCi F-18 FDG was injected intravenously. Full-ring PET imaging was performed from the skull base to thigh after the radiotracer. CT data was obtained and used for attenuation correction and anatomic localization. Fasting blood glucose: 135 mg/dl COMPARISON:  CT chest dated 05/23/2020 FINDINGS: Mediastinal blood pool activity: SUV max 4.0 Liver activity: SUV max NA NECK: 9 mm short axis right posterior cervical node (series 3/image 52), max SUV 4.0. Clustered left supraclavicular nodes measuring up to 9 mm short axis (series 3/image 62), max SUV 8.4. Incidental CT findings: Thyromegaly, without focal hypermetabolism. CHEST: 2.8 x 3.5 cm right upper lobe mass (series 3/image 83), max SUV 8.4, suspicious for primary bronchogenic neoplasm. Innumerable subcentimeter pulmonary nodules throughout all lobes, suspicious for metastases. Thoracic lymphadenopathy, including: --15 mm short axis low right paratracheal node (series 3/image 89), max SUV 7.8 --16 mm short axis subcarinal node (series 3/image 100), max SUV 6.8 Incidental CT findings: Mild atherosclerotic calcifications of the aortic arch. ABDOMEN/PELVIS: No abnormal hypermetabolism in the liver, spleen, pancreas, or adrenal glands. 14 mm right adrenal nodule (series 3/image 137), non FDG avid and unchanged from remote priors, compatible with a benign adrenal adenoma. No hypermetabolic abdominopelvic lymphadenopathy. Incidental CT findings: Postsurgical changes related to prior laparoscopic gastric band. Mild hepatic steatosis. Status post cholecystectomy. Atherosclerotic calcifications of the abdominal aorta and branch vessels. Status post cholecystectomy. SKELETON: No focal hypermetabolic activity to suggest skeletal metastasis. Incidental CT findings: Degenerative changes of the visualized thoracolumbar spine. IMPRESSION: 3.5 cm right upper lobe mass, compatible with primary bronchogenic neoplasm.  Innumerable pulmonary metastases. Lower cervical, supraclavicular, and thoracic nodal metastases, as above. No evidence of metastatic disease in the abdomen/pelvis. Benign right adrenal adenoma. Electronically Signed   By: Julian Hy M.D.   On: 06/13/2020 16:10    Assessment and plan- Patient is a 75 y.o. female   1) Covid-19 Virus Infection - I advised patient to stay well hydrated, particularly in the event of sustained or high fevers,  in whom insensible fluid losses may be significant  -CDC criteria for quarantine and isolation reviewed. - If she develops symptoms, she would be a candidate for mab or oral antivirals. At this point though since she is asymptomatic no treatment recommended.  - encouraged patient to monitor oxygen levels with pulse oximeter, reviewed troubleshooting, and to seek ER for SpO2 < 94% - There is not high-quality data that suggests that supplementation with vitamin C, vitamin D, or zinc reduces the severity of COVID-19 in non-hospitalized patients  Will cancel appointments at the cancer center during quarantine period. Patient will need to notify Dr. Bary Castilla regarding insertion of port-a-cath.    Visit Diagnosis 1. COVID-19 virus infection    Patient expressed understanding and was in agreement with this plan. She also understands that She can call clinic at any time with any questions, concerns, or complaints.   I discussed the assessment and treatment plan with the patient. The patient was provided an opportunity to ask questions and all were answered. The patient agreed with the plan and demonstrated an understanding of the instructions.   The patient was advised to call back or seek an in-person evaluation if the symptoms worsen or if the condition fails to improve as anticipated.   I spent 15 minutes face-to-face video visit time dedicated to the care of this patient on the date of this encounter to include pre-visit review of medical oncology,  face-to-face time with the patient, and post visit ordering of testing/documentation.   Thank you for allowing me to participate in the care of this very pleasant patient.   Beckey Rutter, DNP, AGNP-C [facility]

## 2020-07-10 NOTE — Telephone Encounter (Signed)
Pt called and left vm. She tested positive for covid on Saturday. She is currently asymptomatic. Denies any fevers. No worsening shortness of breath than baseline. She reports a cough, but this has not changed since being dx with lung cancer. Returned phone call to patient. She is agreeable to mychart with Ander Purpura, NP at 10 am today.

## 2020-07-11 ENCOUNTER — Inpatient Hospital Stay: Payer: Medicare Other

## 2020-07-11 DIAGNOSIS — C3411 Malignant neoplasm of upper lobe, right bronchus or lung: Secondary | ICD-10-CM | POA: Diagnosis not present

## 2020-07-12 ENCOUNTER — Encounter: Payer: Self-pay | Admitting: Internal Medicine

## 2020-07-12 ENCOUNTER — Inpatient Hospital Stay: Payer: Medicare Other

## 2020-07-13 ENCOUNTER — Other Ambulatory Visit: Payer: Self-pay

## 2020-07-13 ENCOUNTER — Emergency Department
Admission: EM | Admit: 2020-07-13 | Discharge: 2020-07-13 | Disposition: A | Discharge status: Home / Self Care | Payer: Medicare Other | Attending: Emergency Medicine | Admitting: Emergency Medicine

## 2020-07-13 ENCOUNTER — Emergency Department: Payer: Medicare Other

## 2020-07-13 DIAGNOSIS — E119 Type 2 diabetes mellitus without complications: Secondary | ICD-10-CM | POA: Diagnosis not present

## 2020-07-13 DIAGNOSIS — R0602 Shortness of breath: Secondary | ICD-10-CM | POA: Diagnosis not present

## 2020-07-13 DIAGNOSIS — Z96653 Presence of artificial knee joint, bilateral: Secondary | ICD-10-CM | POA: Diagnosis not present

## 2020-07-13 DIAGNOSIS — Z7984 Long term (current) use of oral hypoglycemic drugs: Secondary | ICD-10-CM | POA: Diagnosis not present

## 2020-07-13 DIAGNOSIS — R531 Weakness: Secondary | ICD-10-CM | POA: Insufficient documentation

## 2020-07-13 DIAGNOSIS — I10 Essential (primary) hypertension: Secondary | ICD-10-CM | POA: Insufficient documentation

## 2020-07-13 DIAGNOSIS — Z79899 Other long term (current) drug therapy: Secondary | ICD-10-CM | POA: Diagnosis not present

## 2020-07-13 DIAGNOSIS — U071 COVID-19: Secondary | ICD-10-CM | POA: Diagnosis not present

## 2020-07-13 LAB — COMPREHENSIVE METABOLIC PANEL
ALT: 17 U/L (ref 0–44)
AST: 27 U/L (ref 15–41)
Albumin: 3.3 g/dL — ABNORMAL LOW (ref 3.5–5.0)
Alkaline Phosphatase: 47 U/L (ref 38–126)
Anion gap: 12 (ref 5–15)
BUN: 18 mg/dL (ref 8–23)
CO2: 30 mmol/L (ref 22–32)
Calcium: 8.8 mg/dL — ABNORMAL LOW (ref 8.9–10.3)
Chloride: 91 mmol/L — ABNORMAL LOW (ref 98–111)
Creatinine, Ser: 0.94 mg/dL (ref 0.44–1.00)
GFR, Estimated: >60 mL/min (ref >60)
Glucose, Bld: 163 mg/dL — ABNORMAL HIGH (ref 70–99)
Potassium: 3.8 mmol/L (ref 3.5–5.1)
Sodium: 133 mmol/L — ABNORMAL LOW (ref 135–145)
Total Bilirubin: 1 mg/dL (ref 0.3–1.2)
Total Protein: 7.6 g/dL (ref 6.5–8.1)

## 2020-07-13 LAB — CBC WITH DIFFERENTIAL/PLATELET
Abs Immature Granulocytes: 0.01 10*3/uL (ref 0.00–0.07)
Basophils Absolute: 0 10*3/uL (ref 0.0–0.1)
Basophils Relative: 0 %
Eosinophils Absolute: 0.2 10*3/uL (ref 0.0–0.5)
Eosinophils Relative: 16 %
HCT: 41.4 % (ref 36.0–46.0)
Hemoglobin: 13.6 g/dL (ref 12.0–15.0)
Immature Granulocytes: 1 %
Lymphocytes Relative: 27 %
Lymphs Abs: 0.3 10*3/uL — ABNORMAL LOW (ref 0.7–4.0)
MCH: 27 pg (ref 26.0–34.0)
MCHC: 32.9 g/dL (ref 30.0–36.0)
MCV: 82.3 fL (ref 80.0–100.0)
Monocytes Absolute: 0.1 10*3/uL (ref 0.1–1.0)
Monocytes Relative: 12 %
Neutro Abs: 0.5 10*3/uL — ABNORMAL LOW (ref 1.7–7.7)
Neutrophils Relative %: 44 %
Platelets: 81 10*3/uL — ABNORMAL LOW (ref 150–400)
RBC: 5.03 MIL/uL (ref 3.87–5.11)
RDW: 12.7 % (ref 11.5–15.5)
Smear Review: NORMAL
WBC: 1 10*3/uL — CL (ref 4.0–10.5)
nRBC: 0 % (ref 0.0–0.2)

## 2020-07-13 LAB — PATHOLOGIST SMEAR REVIEW

## 2020-07-13 MED ORDER — METHYLPREDNISOLONE SODIUM SUCC 125 MG IJ SOLR: 125.0000 mg | Freq: Once | INTRAMUSCULAR | Status: DC | PRN

## 2020-07-13 MED ORDER — SODIUM CHLORIDE 0.9 % IV SOLN: INTRAVENOUS | Status: DC | PRN

## 2020-07-13 MED ORDER — ALBUTEROL SULFATE HFA 108 (90 BASE) MCG/ACT IN AERS
2.0000 | INHALATION_SPRAY | Freq: Once | RESPIRATORY_TRACT | Status: DC | PRN
  Filled 2020-07-13: qty 6.7

## 2020-07-13 MED ORDER — BEBTELOVIMAB 175 MG/2 ML IV (EUA)
175.0000 mg | Freq: Once | INTRAMUSCULAR | Status: AC
  Administered 2020-07-13: 175 mg via INTRAVENOUS
  Filled 2020-07-13: qty 2

## 2020-07-13 MED ORDER — EPINEPHRINE 0.3 MG/0.3ML IJ SOAJ: 0.3000 mg | Freq: Once | INTRAMUSCULAR | Status: DC | PRN

## 2020-07-13 MED ORDER — DIPHENHYDRAMINE HCL 50 MG/ML IJ SOLN: 50.0000 mg | Freq: Once | INTRAMUSCULAR | Status: DC | PRN

## 2020-07-13 MED ORDER — FAMOTIDINE IN NACL 20-0.9 MG/50ML-% IV SOLN: 20.0000 mg | Freq: Once | INTRAVENOUS | Status: DC | PRN

## 2020-07-13 NOTE — ED Notes (Signed)
Pt ambulated with one assist to bathroom.

## 2020-07-13 NOTE — ED Provider Notes (Signed)
[facility] Emergency Department Provider Note   ____________________________________________   Event Date/Time   First MD Initiated Contact with Patient 07/13/20 0901     (approximate)  I have reviewed the triage vital signs and the nursing notes.   HISTORY  Chief Complaint Near Syncope and Weakness    HPI Megan Shields is a 75 y.o. female with the below stated past medical history and significant for lung cancer currently being treated with chemotherapy with the last dose 10 days prior to arrival who presents for increasing generalized weakness in the setting of recent COVID diagnosis approximately 6 days prior to arrival.  Patient is on 2 L of oxygen continuously and states that she has been breathing a little heavier than normal but denies any specific shortness of breath.  Patient does endorses generalized weakness that has no exacerbating or relieving factors and has been worsening since last Friday.  Patient currently denies any vision changes, tinnitus, difficulty speaking, facial droop, sore throat, chest pain, abdominal pain, nausea/vomiting/diarrhea, dysuria, or weakness/numbness/paresthesias in any extremity         Past Medical History:  Diagnosis Date  . Anemia    vitamin d deficiency  . Arthritis   . Chronic cough   . Depression   . Diabetes mellitus without complication ([place])   . Dyspnea   . Hypertension   . ILD (interstitial lung disease) ([place])   . Obesity   . Obesity    BMI 49.44  . Pneumonia   . Pneumonia due to 2019 novel coronavirus 2022   community acquired also. covid positive May 2022  . PONV (postoperative nausea and vomiting)   . Primary cancer of right upper lobe of lung ([place]) 06/30/2020  . Rosacea   . Shoulder contusion 11/22/2015    Patient Active Problem List   Diagnosis Date Noted  . Drug-induced neutropenia ([place]) 07/17/2020  . Primary cancer of right upper lobe of lung ([place]) 06/30/2020  . 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 Vitamin D deficiency 10/06/2019  . Rosacea 04/08/2019  . Hyperlipidemia associated with type 2 diabetes mellitus ([place]) 04/08/2019  . Vertigo 04/07/2018  . Tubular adenoma of colon 03/10/2017  . Tinea pedis of left foot 11/22/2016  . Type II diabetes mellitus with complication ([place]) 86/76/7209  . Essential (primary) hypertension 08/01/2014  . 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 Arthritis of knee, degenerative 08/01/2014  . Low back strain 08/01/2014  . Status post bariatric surgery 08/01/2014    Past Surgical History:  Procedure Laterality Date  . BLEPHAROPLASTY Bilateral 12/19/2017  . CARPAL TUNNEL RELEASE Bilateral 1985  . CHOLECYSTECTOMY    . COLONOSCOPY WITH PROPOFOL N/A 03/07/2017   Procedure: COLONOSCOPY WITH PROPOFOL;  Surgeon: [name], MD;  [facility] ENDOSCOPY;  Service: Gastroenterology;  Laterality: N/A;  . DILATION AND CURETTAGE OF UTERUS  12/2011   benign endometrial polyp  . HALLUX VALGUS AUSTIN Right 09/02/2014   Procedure: [place];  Surgeon: Samara Deist, DPM;  Location: [facility] ORS;  Service: Podiatry;  Laterality: Right;  . HERNIA REPAIR    . JOINT REPLACEMENT Bilateral    TKR  . LAPAROSCOPIC GASTRIC BANDING  2010   Lap Band  . LAPAROSCOPIC TOTAL HYSTERECTOMY  12/23/2019  . lymph node removed Right   . TOTAL KNEE ARTHROPLASTY Bilateral 2003, 2004  . TOTAL KNEE ARTHROPLASTY Bilateral   . VENTRAL HERNIA REPAIR  2008    Prior to Admission medications   Medication Sig Start Date End Date  Taking? Authorizing Provider  acetaminophen (TYLENOL) 500 MG tablet Take 1,000 mg by mouth at bedtime as needed for moderate pain.    [provider]  benzonatate (TESSALON) 200 MG capsule Take 200 mg by mouth 3 (three) times daily as needed for cough. Patient not taking: No sig reported 06/14/20   [provider]  celecoxib (CELEBREX) 200 MG capsule TAKE 1 CAPSULE DAILY Patient taking differently:  Take 200 mg by mouth daily. 05/09/20   Glean Hess, MD  chlorpheniramine-HYDROcodone (TUSSIONEX) 10-8 MG/5ML SUER Take 5 mLs by mouth at bedtime as needed for cough. 06/30/20   Cammie Sickle, MD  cholecalciferol (VITAMIN D3) 25 MCG (1000 UNIT) tablet Take 1,000 Units by mouth daily.    [provider]  folic acid (FOLVITE) 1 MG tablet Take 1 tablet (1 mg total) by mouth daily. 06/30/20   Cammie Sickle, MD  FREESTYLE LITE test strip TEST BLOOD SUGAR TWICE A DAY 02/15/20   Glean Hess, MD  glipiZIDE (GLUCOTROL) 10 MG tablet Take 1 tablet (10 mg total) by mouth daily before breakfast. 07/05/20   Cammie Sickle, MD  Lancets (FREESTYLE) lancets TEST BLOOD SUGAR TWICE A DAY 02/15/20   Glean Hess, MD  levofloxacin (LEVAQUIN) 500 MG tablet Take 1 tablet (500 mg total) by mouth daily. 07/17/20   Jacquelin Hawking, NP  lidocaine-prilocaine (EMLA) cream Apply 30 -45 mins prior to port access. 06/30/20   Cammie Sickle, MD  metFORMIN (GLUCOPHAGE) 500 MG tablet TAKE 1 TABLET THREE TIMES A DAY AFTER MEALS Patient not taking: No sig reported 02/15/20   Glean Hess, MD  olmesartan-hydrochlorothiazide (BENICAR HCT) 40-25 MG tablet Take 1 tablet by mouth daily. 03/07/20   Glean Hess, MD  ondansetron (ZOFRAN) 8 MG tablet One pill every 8 hours as needed for nausea/vomitting. Patient not taking: Reported on 07/17/2020 06/30/20   Cammie Sickle, MD  prochlorperazine (COMPAZINE) 10 MG tablet Take 1 tablet (10 mg total) by mouth every 6 (six) hours as needed for nausea or vomiting. Patient not taking: Reported on 07/17/2020 06/30/20   Cammie Sickle, MD    Allergies Pneumococcal vaccines, Wound dressing adhesive, Amoxicillin, Codeine, and Penicillins  Family History  Problem Relation Age of Onset  . Breast cancer Mother 25  . Breast cancer Paternal Aunt        3 pat aunts  . Liver disease Brother        died 107    Social History Social  History   Tobacco Use  . Smoking status: Never Smoker  . Smokeless tobacco: Never Used  Vaping Use  . Vaping Use: Never used  Substance Use Topics  . Alcohol use: No    Alcohol/week: 0.0 standard drinks  . Drug use: No    Review of Systems Constitutional: No fever/chills Eyes: No visual changes. ENT: No sore throat. Cardiovascular: Denies chest pain. Respiratory: Endorses mild shortness of breath. Gastrointestinal: No abdominal pain.  No nausea, no vomiting.  No diarrhea. Genitourinary: Negative for dysuria. Musculoskeletal: Negative for acute arthralgias Skin: Negative for rash. Neurological: Negative for headaches, weakness/numbness/paresthesias in any extremity Psychiatric: Negative for suicidal ideation/homicidal ideation   ____________________________________________   PHYSICAL EXAM:  VITAL SIGNS: ED Triage Vitals  Enc Vitals Group     BP 07/13/20 0839 (!) 151/87     Pulse Rate 07/13/20 0839 83     Resp 07/13/20 0839 18     Temp 07/13/20 0839 98.7 F (37.1 C)  Temp Source 07/13/20 0839 Oral     SpO2 07/13/20 0839 97 %     Weight 07/13/20 0839 288 lb (130.6 kg)     Height 07/13/20 0839 5\' 4"  (1.626 m)     Head Circumference --      Peak Flow --      Pain Score 07/13/20 0916 0     Pain Loc --      Pain Edu? --      Excl. in [place]? --    Constitutional: Alert and oriented. Well appearing and in no acute distress. Eyes: Conjunctivae are normal. PERRL. Head: Atraumatic. Nose: No congestion/rhinnorhea. Mouth/Throat: Mucous membranes are moist. Neck: No stridor Cardiovascular: Grossly normal heart sounds.  Good peripheral circulation. Respiratory: 2 L nasal cannula in place.  Tachypnea.  Normal respiratory effort.  No retractions. Gastrointestinal: Soft and nontender. No distention. Musculoskeletal: No obvious deformities Neurologic:  Normal speech and language. No gross focal neurologic deficits are appreciated. Skin:  Skin is warm and dry. No rash  noted. Psychiatric: Mood and affect are normal. Speech and behavior are normal.  ____________________________________________   LABS (all labs ordered are listed, but only abnormal results are displayed)  Labs Reviewed  CBC WITH DIFFERENTIAL/PLATELET - Abnormal; Notable for the following components:      Result Value   WBC 1.0 (*)    Platelets 81 (*)    Neutro Abs 0.5 (*)    Lymphs Abs 0.3 (*)    All other components within normal limits  COMPREHENSIVE METABOLIC PANEL - Abnormal; Notable for the following components:   Sodium 133 (*)    Chloride 91 (*)    Glucose, Bld 163 (*)    Calcium 8.8 (*)    Albumin 3.3 (*)    All other components within normal limits  PATHOLOGIST SMEAR REVIEW   RADIOLOGY  ED MD interpretation: Single view portable x-ray of the chest shows diffuse nodular interstitial changes without any interim change from previous PET/CT  Official radiology report(s): No results found.  ____________________________________________   PROCEDURES  Procedure(s) performed (including Critical Care):  .1-3 Lead EKG Interpretation Performed by: Naaman Plummer, MD Authorized by: Naaman Plummer, MD     Interpretation: normal     ECG rate:  87   ECG rate assessment: normal     Rhythm: sinus rhythm     Ectopy: none     Conduction: normal       ____________________________________________   INITIAL IMPRESSION / ASSESSMENT AND PLAN / ED COURSE  As part of my medical decision making, I reviewed the following data within the [place] notes reviewed and incorporated, Labs reviewed, EKG interpreted, Old chart reviewed, Radiograph reviewed and Notes from prior ED visits reviewed and incorporated        Presentation most consistent with Viral Syndrome.  Patient has tested positive for COVID-19. Based on vitals and exam they are nontoxic and stable for discharge.  Given History and Exam I have a lower suspicion for: Emergent  CardioPulmonary causes [such as Acute Asthma or COPD Exacerbation, acute Heart Failure or exacerbation, PE, PTX, atypical ACS, PNA]. Emergent Otolaryngeal causes [such as PTA, RPA, Ludwigs, Epiglottitis, EBV].  Regarding Emergent Travel or Immunosuppressive related infectious: I have a low suspicion for acute HIV.  Will provide strict return precautions and instructions on self-isolation/quarantine and anticipatory guidance.      ____________________________________________   FINAL CLINICAL IMPRESSION(S) / ED DIAGNOSES  Final diagnoses:  COVID-19 virus infection  Generalized weakness  ED Discharge Orders    None       Note:  This document was prepared using Dragon voice recognition software and may include unintentional dictation errors.   Naaman Plummer, MD 07/18/20 [phone]

## 2020-07-13 NOTE — ED Notes (Signed)
Disregard previous note about ambulation to bathroom. Wrong chart,.

## 2020-07-13 NOTE — Progress Notes (Signed)
Called to admit patient who has a history of lung cancer and recent COVID-19 viral infection. Discussed with Dr Cheri Fowler, ER physician who wanted to wait until he spoke to oncology to decide if patient needed to be admitted. Discussed again with Dr. Cheri Fowler and patient will receive monoclonal antibody in the ER and be discharged home.

## 2020-07-13 NOTE — ED Notes (Signed)
Pt advised she is ready to go home and said MD advised he was not keeping her. Will let MD know.

## 2020-07-13 NOTE — Discharge Instructions (Addendum)
Please seek medical attention for any high fevers, chest pain, shortness of breath, change in behavior, persistent vomiting, bloody stool or any other new or concerning symptoms.  

## 2020-07-13 NOTE — ED Triage Notes (Signed)
First Nurse Note:  C/O cough and feeling bad.  Patient states she had her first chemo treatment and then got diagnosed with COVID.    AAOx3.  Skin warm and dry.  Strong cough.  Wears home oxygen.

## 2020-07-17 ENCOUNTER — Telehealth: Payer: Self-pay | Admitting: Pharmacist

## 2020-07-17 ENCOUNTER — Encounter
Admission: RE | Admit: 2020-07-17 | Discharge: 2020-07-17 | Disposition: A | Discharge status: Home / Self Care | Payer: Medicare Other | Source: Ambulatory Visit | Attending: General Surgery | Admitting: General Surgery

## 2020-07-17 ENCOUNTER — Other Ambulatory Visit: Payer: Self-pay

## 2020-07-17 ENCOUNTER — Inpatient Hospital Stay (HOSPITAL_BASED_OUTPATIENT_CLINIC_OR_DEPARTMENT_OTHER): Payer: Medicare Other | Admitting: Oncology

## 2020-07-17 ENCOUNTER — Other Ambulatory Visit (HOSPITAL_COMMUNITY): Payer: Self-pay

## 2020-07-17 ENCOUNTER — Inpatient Hospital Stay: Payer: Medicare Other

## 2020-07-17 ENCOUNTER — Telehealth: Payer: Self-pay

## 2020-07-17 ENCOUNTER — Encounter: Payer: Self-pay | Admitting: *Deleted

## 2020-07-17 ENCOUNTER — Encounter: Payer: Self-pay | Admitting: Internal Medicine

## 2020-07-17 VITALS — BP 118/68 | HR 74 | Temp 98.6°F | Resp 18

## 2020-07-17 DIAGNOSIS — C3411 Malignant neoplasm of upper lobe, right bronchus or lung: Secondary | ICD-10-CM

## 2020-07-17 DIAGNOSIS — E860 Dehydration: Secondary | ICD-10-CM | POA: Diagnosis not present

## 2020-07-17 DIAGNOSIS — D701 Agranulocytosis secondary to cancer chemotherapy: Secondary | ICD-10-CM | POA: Diagnosis not present

## 2020-07-17 DIAGNOSIS — D702 Other drug-induced agranulocytosis: Secondary | ICD-10-CM | POA: Diagnosis not present

## 2020-07-17 DIAGNOSIS — C3490 Malignant neoplasm of unspecified part of unspecified bronchus or lung: Secondary | ICD-10-CM

## 2020-07-17 DIAGNOSIS — C770 Secondary and unspecified malignant neoplasm of lymph nodes of head, face and neck: Secondary | ICD-10-CM | POA: Diagnosis not present

## 2020-07-17 DIAGNOSIS — E1165 Type 2 diabetes mellitus with hyperglycemia: Secondary | ICD-10-CM | POA: Diagnosis not present

## 2020-07-17 DIAGNOSIS — Z5111 Encounter for antineoplastic chemotherapy: Secondary | ICD-10-CM | POA: Diagnosis not present

## 2020-07-17 DIAGNOSIS — T451X5A Adverse effect of antineoplastic and immunosuppressive drugs, initial encounter: Secondary | ICD-10-CM | POA: Diagnosis not present

## 2020-07-17 HISTORY — DX: Rosacea, unspecified: L71.9

## 2020-07-17 HISTORY — DX: Interstitial pulmonary disease, unspecified: J84.9

## 2020-07-17 HISTORY — DX: Chronic cough: R05.3

## 2020-07-17 HISTORY — DX: Anemia, unspecified: D64.9

## 2020-07-17 HISTORY — DX: Depression, unspecified: F32.A

## 2020-07-17 HISTORY — DX: Pneumonia, unspecified organism: J18.9

## 2020-07-17 HISTORY — DX: Dyspnea, unspecified: R06.00

## 2020-07-17 LAB — COMPREHENSIVE METABOLIC PANEL
ALT: 18 U/L (ref 0–44)
AST: 27 U/L (ref 15–41)
Albumin: 3.4 g/dL — ABNORMAL LOW (ref 3.5–5.0)
Alkaline Phosphatase: 52 U/L (ref 38–126)
Anion gap: 12 (ref 5–15)
BUN: 20 mg/dL (ref 8–23)
CO2: 27 mmol/L (ref 22–32)
Calcium: 8.8 mg/dL — ABNORMAL LOW (ref 8.9–10.3)
Chloride: 95 mmol/L — ABNORMAL LOW (ref 98–111)
Creatinine, Ser: 0.81 mg/dL (ref 0.44–1.00)
GFR, Estimated: >60 mL/min (ref >60)
Glucose, Bld: 127 mg/dL — ABNORMAL HIGH (ref 70–99)
Potassium: 3.5 mmol/L (ref 3.5–5.1)
Sodium: 134 mmol/L — ABNORMAL LOW (ref 135–145)
Total Bilirubin: 0.7 mg/dL (ref 0.3–1.2)
Total Protein: 7.6 g/dL (ref 6.5–8.1)

## 2020-07-17 LAB — CBC WITH DIFFERENTIAL/PLATELET
Abs Immature Granulocytes: 0 10*3/uL (ref 0.00–0.07)
Basophils Absolute: 0 10*3/uL (ref 0.0–0.1)
Basophils Relative: 0 %
Eosinophils Absolute: 0.1 10*3/uL (ref 0.0–0.5)
Eosinophils Relative: 5 %
HCT: 38.9 % (ref 36.0–46.0)
Hemoglobin: 13 g/dL (ref 12.0–15.0)
Immature Granulocytes: 0 %
Lymphocytes Relative: 47 %
Lymphs Abs: 0.5 10*3/uL — ABNORMAL LOW (ref 0.7–4.0)
MCH: 27.1 pg (ref 26.0–34.0)
MCHC: 33.4 g/dL (ref 30.0–36.0)
MCV: 81 fL (ref 80.0–100.0)
Monocytes Absolute: 0.2 10*3/uL (ref 0.1–1.0)
Monocytes Relative: 22 %
Neutro Abs: 0.3 10*3/uL — CL (ref 1.7–7.7)
Neutrophils Relative %: 26 %
Platelets: 38 10*3/uL — ABNORMAL LOW (ref 150–400)
RBC: 4.8 MIL/uL (ref 3.87–5.11)
RDW: 12.5 % (ref 11.5–15.5)
Smear Review: NORMAL
WBC: 1 10*3/uL — CL (ref 4.0–10.5)
nRBC: 0 % (ref 0.0–0.2)

## 2020-07-17 MED ORDER — SODIUM CHLORIDE 0.9 % IV SOLN: 10.0000 mg | Freq: Once | INTRAVENOUS | Status: DC

## 2020-07-17 MED ORDER — TBO-FILGRASTIM 300 MCG/0.5ML ~~LOC~~ SOSY
300.0000 ug | PREFILLED_SYRINGE | Freq: Once | SUBCUTANEOUS | Status: DC
  Filled 2020-07-17: qty 0.5

## 2020-07-17 MED ORDER — LEVOFLOXACIN 500 MG PO TABS: 500.0000 mg | ORAL_TABLET | Freq: Every day | ORAL | 0 refills | Status: DC

## 2020-07-17 MED ORDER — DEXAMETHASONE SODIUM PHOSPHATE 10 MG/ML IJ SOLN
10.0000 mg | Freq: Once | INTRAMUSCULAR | Status: AC
  Administered 2020-07-17: 10 mg via INTRAVENOUS

## 2020-07-17 MED ORDER — SODIUM CHLORIDE 0.9 % IV SOLN
Freq: Once | INTRAVENOUS | Status: AC
  Filled 2020-07-17: qty 250

## 2020-07-17 MED ORDER — TBO-FILGRASTIM 480 MCG/0.8ML ~~LOC~~ SOSY
480.0000 ug | PREFILLED_SYRINGE | Freq: Once | SUBCUTANEOUS | Status: AC
  Administered 2020-07-17: 480 ug via SUBCUTANEOUS
  Filled 2020-07-17: qty 0.8

## 2020-07-17 NOTE — Progress Notes (Signed)
IVF's given with supportive meds. Granix injection given as well. Pt to return daily for granix for a total of 5 days. Patient discharged to home accompanied by husband.

## 2020-07-17 NOTE — Patient Instructions (Addendum)
INSTRUCTIONS FOR SURGERY     Your surgery is scheduled for:   Friday, MAY 27TH     To find out your arrival time for the day of surgery,          please call [phone] between 1 pm and 3 pm on : Thursday, MAY 26TH     When you arrive for surgery, report to the [facility]. ONCE THEY HAVE COMPLETED THEIR PROCESS, PROCEED TO THE SECOND FLOOR AND SIGN IN AT THE SURGERY DESK.    REMEMBER: Instructions that are not followed completely may result in serious medical risk,  up to and including death, or upon the discretion of your surgeon and anesthesiologist,            your surgery may need to be rescheduled.  __X__ 1. Do not eat food after midnight the night before your procedure.                    No gum, candy, lozenger, tic tacs, tums or hard candies.                  ABSOLUTELY NOTHING SOLID IN YOUR MOUTH AFTER MIDNIGHT                    You may drink unlimited clear liquids up to 2 hours before you are scheduled to arrive for surgery.                   Do not drink anything within those 2 hours unless you need to take medicine, then take the                   smallest amount you need.  Clear liquids include:  water,  black coffee, black tea.                    Sugar may be added but no dairy/ honey /lemon.                                              Broth and jello is not considered a clear liquid.  __x__  2. On the morning of surgery, please brush your teeth with toothpaste and water. You may rinse with                  mouthwash if you wish but DO NOT SWALLOW TOOTHPASTE OR MOUTHWASH  __X___3. NO alcohol for 24 hours before or after surgery.  __x___ 4.  Do NOT smoke or use e-cigarettes for 24 HOURS PRIOR TO SURGERY.                      DO NOT Use any chewable tobacco products for at least 6 hours prior to surgery.  __x___ 5. If you start any new medication after this  appointment and prior to surgery, please  Bring it with you on the day of surgery.  ___x__ 6. Notify your doctor if there is any change in your medical condition, such as fever,                  infection, vomitting, diarrhea or any open sores.  __x___ 7.  USE the CHG SOAP as instructed, the night before surgery and the day of surgery.                   Once you have washed with this soap, do NOT use any of the following: Powders, perfumes                    or lotions. Please do not wear make up, hairpins, clips or nail polish. You MAY wear deodorant.                                              Women need to shave 48 hours prior to surgery.                   DO NOT wear ANY jewelry on the day of surgery. If there are rings that are too tight to                    remove easily, please address this prior to the surgery day. Piercings need to be removed.                                                                     NO METAL ON YOUR BODY.                    Do NOT bring any valuables.  If you came to Pre-Admit testing then you will not need license,                     insurance card or credit card.  If you will be staying overnight, please either leave your things in                     the car or have your family be responsible for these items.                     [facility] IS NOT RESPONSIBLE FOR BELONGINGS OR VALUABLES.  ___X__ 8. DO NOT wear contact lenses on surgery day.  You may not have dentures,                     Hearing aides, contacts or glasses in the operating room. These items can be                    Placed in the Recovery Room to receive immediately after surgery.  __x___ 9. IF YOU ARE SCHEDULED TO GO HOME ON THE SAME DAY, YOU MUST                   Have someone to drive you home and to stay with you  for the  first 24 hours.                    Have an arrangement prior to arriving on surgery day.  ___x__ 10. Take the following medications on the  morning of surgery with a sip of water:                              1. ZOFRAN, only if needed.                     2.                     3.    _X__  12. STOP ALL ASPIRIN PRODUCTS AS OF TODAY, MAY 23RD                       THIS INCLUDES BC POWDERS / GOODIES POWDER  __x___ 13. STOP Anti-inflammatories as of TODAY, MAY 23RD                      This includes IBUPROFEN / MOTRIN / ADVIL / ALEVE/ NAPROXYN /                             AND CELEBREX                    YOU MAY TAKE TYLENOL ANY TIME PRIOR TO SURGERY.  ___X__ 14.  Stop supplements until after surgery.                     This includes: VITAMIN D // FOLIC ACID                 __X___16.   CONTINUE TAKING GLIPIZIDE, BUT DO NOT TAKE ON THE MORNING OF                        SURGERY.                            Do NOT take any diabetes medications on surgery day.  ___X___17.  Continue to take the following medications but do not take on the morning of surgery:                           BENICAR // COMPAZINE // GLIPIZIDE  ____X__18.  Wear clean and comfortable clothing to the hospital.  BRING PHONE NUMBERS FOR [place]. YOU AND YOUR HUSBAND NEED A MASK WHEN ENTERING THE HOSPITAL.   COVID RECHECK ON MAY 25TH, Wednesday.

## 2020-07-17 NOTE — Telephone Encounter (Signed)
Oral Oncology Pharmacist Encounter  Received new prescription for Tagrisso (osimertinib) for the treatment of newly diagnosed NSCLC, planned duration until disease progression or unacceptable drug toxicity. Ms. 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 She will now be switching to treatment with Tagrisso (osimertinib).  Ms. Megan Shields had a ECG on 07/13/20 that had a QTc of 426 ms, no baseline concern for QTc prolongation. Prescription dose and frequency assessed.   Current medication list in Epic reviewed, no revelant DDIs with osimertinib and Ms. Megan Shields's regular medication identified. She was prescribed levofloxacin today for a 10 day course. Due to in increased risk of QTc with levofloxain and venetoclax, would recommend holding on starting osimertinib until at least a day after completing her levofloxacin course.  Evaluated chart and no patient barriers to medication adherence identified.   Prescription has been e-scribed to the [facility] for benefits analysis and approval.  Oral Oncology Clinic will continue to follow for insurance authorization, copayment issues, initial counseling and start date.  Darl Pikes, PharmD, BCPS, BCOP, CPP Hematology/Oncology Clinical Pharmacist Practitioner [facility] [phone]  07/17/2020 1:35 PM

## 2020-07-17 NOTE — Progress Notes (Signed)
Symptom Management Consult note [facility]  Telephone:([phone] [phone]  Patient Care Team: Glean Hess, MD as PCP - General (Internal Medicine) [facility] (Ophthalmology) Jannet Mantis, MD (Dermatology) Telford Nab, RN as Oncology Nurse Navigator   Name of the patient: Megan Shields  [id number]  [birth year]   Date of visit: 07/17/2020   Diagnosis-lung cancer  Chief complaint/ Reason for visit- Cough, weakness  Heme/Onc history: [name] is a 75 year old female with past medical history significant for diabetes, hypertension, hyperlipidemia and lung cancer who is followed by Dr. Rogue Bussing and is status post cycle 1 of carbo and Alimta.  First treatment was given on 07/05/2020.  She is on 2L O2 24/7.   She tested positive for COVID on 07/10/2020 but was asymptomatic. Did not receive antiviral or MAB.   She was evaluated in the emergency room on 07/13/2020 for near syncope and weakness.  Reported some shortness of breath since her diagnosis.  She received MAB on 07/13/2020.  She was discharged home in stable condition.  Interval history-today, she presents to symptom management for concerns of weakness and cough since being discharged home from the emergency room.  Reports no improvement since MAB infusion in [facility] ED. Cough is worse at night.  Has copious amounts of phlegm production especially first thing in the morning-clear in color.  She denies any fevers.  Appetite has been fair.  Reports trying to stay hydrated.  Stable on her oxygen on 2 L.  Denies any increased oxygen requirements.  ECOG FS:2 - Symptomatic, <50% confined to bed  Review of systems- Review of Systems  Constitutional: Negative.  Negative for chills, fever, malaise/fatigue and weight loss.  HENT: Negative for congestion, ear pain and tinnitus.   Eyes: Negative.  Negative for blurred vision and double vision.  Respiratory: Positive for cough.  Negative for sputum production and shortness of breath.   Cardiovascular: Negative.  Negative for chest pain, palpitations and leg swelling.  Gastrointestinal: Negative.  Negative for abdominal pain, constipation, diarrhea, nausea and vomiting.  Genitourinary: Negative for dysuria, frequency and urgency.  Musculoskeletal: Negative for back pain and falls.  Skin: Negative.  Negative for rash.  Neurological: Positive for weakness. Negative for headaches.  Endo/Heme/Allergies: Negative.  Does not bruise/bleed easily.  Psychiatric/Behavioral: Negative for depression. The patient has insomnia. The patient is not nervous/anxious.      Current treatment- s/p cycle 1 carbo/etoposide-07/05/2020  Allergies  Allergen Reactions  . Pneumococcal Vaccines Hives  . Wound Dressing Adhesive Rash    Paper tape is okay. Tears off skin. blisters  . Amoxicillin Rash  . Codeine     Other reaction(s): drowsiness  . Penicillins Rash     Past Medical History:  Diagnosis Date  . Anemia    vitamin d deficiency  . Arthritis   . Chronic cough   . Depression   . Diabetes mellitus without complication ([place])   . Dyspnea   . Hypertension   . ILD (interstitial lung disease) ([place])   . Obesity   . Obesity    BMI 49.44  . Pneumonia   . Pneumonia due to 2019 novel coronavirus 2022   community acquired also. covid positive May 2022  . PONV (postoperative nausea and vomiting)   . Primary cancer of right upper lobe of lung ([place]) 06/30/2020  . Rosacea   . Shoulder contusion 11/22/2015     Past Surgical History:  Procedure Laterality Date  . BLEPHAROPLASTY Bilateral 12/19/2017  .  CARPAL TUNNEL RELEASE Bilateral 1985  . CHOLECYSTECTOMY    . COLONOSCOPY WITH PROPOFOL N/A 03/07/2017   Procedure: COLONOSCOPY WITH PROPOFOL;  Surgeon: [name], MD;  Location: [facility] ENDOSCOPY;  Service: Gastroenterology;  Laterality: N/A;  . DILATION AND CURETTAGE OF UTERUS  12/2011   benign endometrial polyp  . HALLUX VALGUS  [place] Right 09/02/2014   Procedure: [place];  Surgeon: Samara Deist, DPM;  Location: [facility] ORS;  Service: Podiatry;  Laterality: Right;  . HERNIA REPAIR    . JOINT REPLACEMENT Bilateral    TKR  . LAPAROSCOPIC GASTRIC BANDING  2010   Lap Band  . LAPAROSCOPIC TOTAL HYSTERECTOMY  12/23/2019  . lymph node removed Right   . TOTAL KNEE ARTHROPLASTY Bilateral 2003, 2004  . TOTAL KNEE ARTHROPLASTY Bilateral   . VENTRAL HERNIA REPAIR  2008    Social History   Socioeconomic History  . Marital status: Married    Spouse name: Fritz Pickerel  . Number of children: Not on file  . Years of education: Not on file  . Highest education level: Not on file  Occupational History  . Occupation: Probation officer    Comment: RETIRED  Tobacco Use  . Smoking status: Never Smoker  . Smokeless tobacco: Never Used  Vaping Use  . Vaping Use: Never used  Substance and Sexual Activity  . Alcohol use: No    Alcohol/week: 0.0 standard drinks  . Drug use: No  . Sexual activity: Not Currently  Other Topics Concern  . Not on file  Social History Narrative   Never smoked; no alcohol. Lives in [place] with husband . Home maker.    Daughter lives next door. 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 Difficulty of Paying Living Expenses: Not hard at all  Food Insecurity: No Food Insecurity  . Worried About Charity fundraiser in the Last Year: Never true  . Ran Out of Food in the Last Year: Never true  Transportation Needs: No Transportation Needs  . Lack of Transportation (Medical): No  . Lack of Transportation (Non-Medical): No  Physical Activity: Insufficiently Active  . Days of Exercise per Week: 3 days  . Minutes of Exercise per Session: 30 min  Stress: No Stress Concern Present  . Feeling of Stress : Not at all  Social Connections: Moderately Isolated  . Frequency of Communication with Friends and Family: More than  three times a week  . Frequency of Social Gatherings with Friends and Family: More than three times a week  . Attends Religious Services: Never  . Active Member of Clubs or Organizations: No  . Attends Archivist Meetings: Never  . Marital Status: Married  Human resources officer Violence: Not At Risk  . Fear of Current or Ex-Partner: No  . Emotionally Abused: No  . Physically Abused: No  . Sexually Abused: No    Family History  Problem Relation Age of Onset  . Breast cancer Mother 72  . Breast cancer Paternal Aunt        3 pat aunts  . Liver disease Brother        died 45     Current Outpatient Medications:  .  acetaminophen (TYLENOL) 500 MG tablet, Take 1,000 mg by mouth at bedtime as needed for moderate pain., Disp: , Rfl:  .  celecoxib (CELEBREX) 200 MG capsule, TAKE 1 CAPSULE DAILY (Patient taking differently: Take 200 mg by  mouth daily.), Disp: 90 capsule, Rfl: 0 .  chlorpheniramine-HYDROcodone (TUSSIONEX) 10-8 MG/5ML SUER, Take 5 mLs by mouth at bedtime as needed for cough., Disp: 140 mL, Rfl: 0 .  cholecalciferol (VITAMIN D3) 25 MCG (1000 UNIT) tablet, Take 1,000 Units by mouth daily., Disp: , Rfl:  .  folic acid (FOLVITE) 1 MG tablet, Take 1 tablet (1 mg total) by mouth daily., Disp: 90 tablet, Rfl: 1 .  FREESTYLE LITE test strip, TEST BLOOD SUGAR TWICE A DAY, Disp: 100 strip, Rfl: 6 .  glipiZIDE (GLUCOTROL) 10 MG tablet, Take 1 tablet (10 mg total) by mouth daily before breakfast., Disp: 90 tablet, Rfl: 1 .  Lancets (FREESTYLE) lancets, TEST BLOOD SUGAR TWICE A DAY, Disp: 100 each, Rfl: 6 .  levofloxacin (LEVAQUIN) 500 MG tablet, Take 1 tablet (500 mg total) by mouth daily., Disp: 10 tablet, Rfl: 0 .  lidocaine-prilocaine (EMLA) cream, Apply 30 -45 mins prior to port access., Disp: 30 g, Rfl: 0 .  olmesartan-hydrochlorothiazide (BENICAR HCT) 40-25 MG tablet, Take 1 tablet by mouth daily., Disp: 90 tablet, Rfl: 3 .  benzonatate (TESSALON) 200 MG capsule, Take 200 mg by  mouth 3 (three) times daily as needed for cough. (Patient not taking: No sig reported), Disp: , Rfl:  .  metFORMIN (GLUCOPHAGE) 500 MG tablet, TAKE 1 TABLET THREE TIMES A DAY AFTER MEALS (Patient not taking: No sig reported), Disp: 270 tablet, Rfl: 1 .  ondansetron (ZOFRAN) 8 MG tablet, One pill every 8 hours as needed for nausea/vomitting. (Patient not taking: Reported on 07/17/2020), Disp: 40 tablet, Rfl: 1 .  prochlorperazine (COMPAZINE) 10 MG tablet, Take 1 tablet (10 mg total) by mouth every 6 (six) hours as needed for nausea or vomiting. (Patient not taking: Reported on 07/17/2020), Disp: 40 tablet, Rfl: 1  Physical exam:  Vitals:   07/17/20 1307  BP: 118/68  Pulse: 74  Resp: 18  Temp: 98.6 F (37 C)  TempSrc: Tympanic  SpO2: 99%   Physical Exam Constitutional:      Appearance: Normal appearance.  HENT:     Head: Normocephalic and atraumatic.  Eyes:     Pupils: Pupils are equal, round, and reactive to light.  Cardiovascular:     Rate and Rhythm: Normal rate and regular rhythm.     Heart sounds: Normal heart sounds. No murmur heard.   Pulmonary:     Effort: Pulmonary effort is normal.     Breath sounds: Normal breath sounds. No wheezing.  Abdominal:     General: Bowel sounds are normal. There is no distension.     Palpations: Abdomen is soft.     Tenderness: There is no abdominal tenderness.  Musculoskeletal:        General: Normal range of motion.     Cervical back: Normal range of motion.  Skin:    General: Skin is warm and dry.     Findings: No rash.  Neurological:     Mental Status: She is alert and oriented to person, place, and time.  Psychiatric:        Judgment: Judgment normal.      CMP Latest Ref Rng & Units 07/17/2020  Glucose 70 - 99 mg/dL 127(H)  BUN 8 - 23 mg/dL 20  Creatinine 0.44 - 1.00 mg/dL 0.81  Sodium 135 - 145 mmol/L 134(L)  Potassium 3.5 - 5.1 mmol/L 3.5  Chloride 98 - 111 mmol/L 95(L)  CO2 22 - 32 mmol/L 27  Calcium 8.9 - 10.3 mg/dL  8.8(L)  Total  Protein 6.5 - 8.1 g/dL 7.6  Total Bilirubin 0.3 - 1.2 mg/dL 0.7  Alkaline Phos 38 - 126 U/L 52  AST 15 - 41 U/L 27  ALT 0 - 44 U/L 18   CBC Latest Ref Rng & Units 07/17/2020  WBC 4.0 - 10.5 K/uL 1.0(LL)  Hemoglobin 12.0 - 15.0 g/dL 13.0  Hematocrit 36.0 - 46.0 % 38.9  Platelets 150 - 400 K/uL 38(L)    No images are attached to the encounter.  MR Brain W Wo Contrast  Result Date: 07/05/2020 CLINICAL DATA:  Malignant neoplasm of unspecified part of unspecified bronchus or lung. Non-small cell lung cancer, staging. EXAM: MRI HEAD WITHOUT AND WITH CONTRAST TECHNIQUE: Multiplanar, multiecho pulse sequences of the brain and surrounding structures were obtained without and with intravenous contrast. CONTRAST:  20m GADAVIST GADOBUTROL 1 MMOL/ML IV SOLN COMPARISON:  None. FINDINGS: Brain: No acute infarction, hemorrhage, hydrocephalus, extra-axial collection or mass lesion. Scattered foci of T2 hyperintensity are seen within the white matter of the cerebral hemispheres, nonspecific. No focus of abnormal contrast enhancement. Vascular: Normal flow voids. Skull and upper cervical spine: Normal marrow signal. Sinuses/Orbits: Negative. Other: None. IMPRESSION: 1. No evidence of intracranial metastatic disease. 2. Mild chronic microvascular ischemic changes of the white matter. Electronically Signed   By: KPedro EarlsM.D.   On: 07/05/2020 13:11   DG Chest Portable 1 View  Result Date: 07/13/2020 CLINICAL DATA:  Shortness of breath. EXAM: PORTABLE CHEST 1 VIEW COMPARISON:  PET-CT 06/13/2020.  Chest CT 05/23/2020. FINDINGS: Mediastinum stable. Heart size stable. Diffuse nodular interstitial changes are again noted without interim change. Reference is made to PET-CT report of 06/13/2020 for discussion of right pulmonary mass and changes of metastatic disease. No pleural effusion or pneumothorax. IMPRESSION: Diffuse nodular interstitial changes are again noted without interim  change. Reference is made to prior PET-CT report of 06/13/2020 for discussion of right pulmonary mass and changes of metastatic disease. Electronically Signed   By: TMarcello Moores Register   On: 07/13/2020 10:37     Assessment and plan- Patient is a 75y.o. female who presents for weakness and persistent cough post COVID-19 infection.  Lung cancer: - Recently diagnosed after presenting for persistent cough refractory to antibiotics. -CT chest on 05/23/2020 showed spiculated right upper lobe mass with perilymphatic nodularity worrisome for bronchogenic carcinoma. -PET scan on 06/13/2020 confirmed diagnosis with mets to the bone. -MRI of her brain was negative. -She is followed by Dr. BRogue Bussingand has received 1 cycle of carbo/Alimta on 07/05/2020. - Appeared to tolerate first treatment well. -Treatment will likely be switched secondary to positive EGFR mutation.  -Plan is to begin Tagrisso per Dr. BRogue Bussing  -Nuala Alphanotified  COVID-19 infection: -Tested positive on 07/10/20 but was asymptomatic. -[facility] with weakness and near syncope. -Treated with Mab and sent home.  Persistent cough/weakness: -Likely secondary to recent COVID-19 infection. -Labs today (CBC, CMP) -Plan to give IV fluids.   Neutropenia: -Secondary to chemotherapy. -ANC 300-WBC 1 - Spoke with Dr. BRogue Bussingwho recommends Granix 4836m  x5 days - Rx Levaquin 500 mg daily x10 days for prophylaxis.  Disposition: - Proceed with Granix 480 mcg today and daily for the rest of the week.  -RTC as scheduled on 07/26/2020 for lab work and MD assessment. -Plan is to switch to oral chemo d/t EGFR mutation.   Visit Diagnosis 1. Drug-induced neutropenia ([employer]  2. Dehydration     Patient expressed understanding and was in  agreement with this plan. She also understands that She can call clinic at any time with any questions, concerns, or complaints.    Greater than 50% was spent in counseling  and coordination of care with this patient including but not limited to discussion of the relevant topics above (See A&P) including, but not limited to diagnosis and management of acute and chronic medical conditions.   Thank you for allowing me to participate in the care of this very pleasant patient.    Jacquelin Hawking, NP [facility] [place] [facility] Cell - [phone] Pager- [phone] 07/17/2020 4:11 PM

## 2020-07-17 NOTE — Progress Notes (Signed)
Patient here for oncology follow-up appointment, expresses concerns of SOB and weakness

## 2020-07-17 NOTE — Pre-Procedure Instructions (Signed)
Spoke with patient via phone today for her preop interview.  She states she continues to cough regularly. She was covid positive on May 13th, 2022.  She retested on May 21st and was negative.  For this reason, she will be retested prior to her procedure.

## 2020-07-18 ENCOUNTER — Inpatient Hospital Stay: Payer: Medicare Other

## 2020-07-18 ENCOUNTER — Telehealth: Payer: Self-pay | Admitting: Internal Medicine

## 2020-07-18 DIAGNOSIS — D701 Agranulocytosis secondary to cancer chemotherapy: Secondary | ICD-10-CM | POA: Diagnosis not present

## 2020-07-18 DIAGNOSIS — C3411 Malignant neoplasm of upper lobe, right bronchus or lung: Secondary | ICD-10-CM | POA: Diagnosis not present

## 2020-07-18 DIAGNOSIS — C770 Secondary and unspecified malignant neoplasm of lymph nodes of head, face and neck: Secondary | ICD-10-CM | POA: Diagnosis not present

## 2020-07-18 DIAGNOSIS — D702 Other drug-induced agranulocytosis: Secondary | ICD-10-CM

## 2020-07-18 DIAGNOSIS — Z5111 Encounter for antineoplastic chemotherapy: Secondary | ICD-10-CM | POA: Diagnosis not present

## 2020-07-18 DIAGNOSIS — T451X5A Adverse effect of antineoplastic and immunosuppressive drugs, initial encounter: Secondary | ICD-10-CM | POA: Diagnosis not present

## 2020-07-18 DIAGNOSIS — E1165 Type 2 diabetes mellitus with hyperglycemia: Secondary | ICD-10-CM | POA: Diagnosis not present

## 2020-07-18 MED ORDER — OSIMERTINIB MESYLATE 80 MG PO TABS
80.0000 mg | ORAL_TABLET | Freq: Every day | ORAL | 2 refills | Status: DC
Start: 2020-07-18 — End: 2020-10-12
  Filled 2020-07-18 – 2020-07-21 (×2): qty 30, 30d supply, fill #0
  Filled 2020-08-16: qty 30, 30d supply, fill #1
  Filled 2020-09-15: qty 30, 30d supply, fill #2

## 2020-07-18 MED ORDER — TBO-FILGRASTIM 480 MCG/0.8ML ~~LOC~~ SOSY
480.0000 ug | PREFILLED_SYRINGE | Freq: Once | SUBCUTANEOUS | Status: AC
Start: 2020-07-18 — End: 2020-07-18
  Administered 2020-07-18: 480 ug via SUBCUTANEOUS
  Filled 2020-07-18: qty 0.8

## 2020-07-18 NOTE — Telephone Encounter (Signed)
On 5/23-spoke to patient regarding EGFR mutation results noted on NGS.  Patient will be candidate for osimertinib.  We will hold further chemotherapy at this time.  Discussed regarding holding port placement.  Discussed with Dr. Bary Castilla.  Discussed with pharmacy regarding osimertinib.  Follow up as planned.

## 2020-07-19 ENCOUNTER — Other Ambulatory Visit: Payer: Self-pay

## 2020-07-19 ENCOUNTER — Other Ambulatory Visit (HOSPITAL_COMMUNITY): Payer: Self-pay

## 2020-07-19 ENCOUNTER — Other Ambulatory Visit: Payer: Medicare Other

## 2020-07-19 ENCOUNTER — Inpatient Hospital Stay: Payer: Medicare Other

## 2020-07-19 DIAGNOSIS — D702 Other drug-induced agranulocytosis: Secondary | ICD-10-CM

## 2020-07-19 DIAGNOSIS — T451X5A Adverse effect of antineoplastic and immunosuppressive drugs, initial encounter: Secondary | ICD-10-CM | POA: Diagnosis not present

## 2020-07-19 DIAGNOSIS — D701 Agranulocytosis secondary to cancer chemotherapy: Secondary | ICD-10-CM | POA: Diagnosis not present

## 2020-07-19 DIAGNOSIS — Z5111 Encounter for antineoplastic chemotherapy: Secondary | ICD-10-CM | POA: Diagnosis not present

## 2020-07-19 DIAGNOSIS — C3411 Malignant neoplasm of upper lobe, right bronchus or lung: Secondary | ICD-10-CM | POA: Diagnosis not present

## 2020-07-19 DIAGNOSIS — C770 Secondary and unspecified malignant neoplasm of lymph nodes of head, face and neck: Secondary | ICD-10-CM | POA: Diagnosis not present

## 2020-07-19 DIAGNOSIS — E1165 Type 2 diabetes mellitus with hyperglycemia: Secondary | ICD-10-CM | POA: Diagnosis not present

## 2020-07-19 MED ORDER — TBO-FILGRASTIM 480 MCG/0.8ML ~~LOC~~ SOSY
480.0000 ug | PREFILLED_SYRINGE | Freq: Once | SUBCUTANEOUS | Status: AC
Start: 2020-07-19 — End: 2020-07-19
  Administered 2020-07-19: 480 ug via SUBCUTANEOUS
  Filled 2020-07-19: qty 0.8

## 2020-07-19 NOTE — Telephone Encounter (Signed)
Ok to cancel chemo infusion for that day. GB

## 2020-07-19 NOTE — Telephone Encounter (Signed)
Pt scheduled for follow up on 6/1 with cheme infusion. Do you want me to cancel her chemo that day and keep her appt scheduled for lab/md?

## 2020-07-20 ENCOUNTER — Inpatient Hospital Stay: Payer: Medicare Other

## 2020-07-20 ENCOUNTER — Other Ambulatory Visit: Payer: Self-pay

## 2020-07-20 DIAGNOSIS — C3411 Malignant neoplasm of upper lobe, right bronchus or lung: Secondary | ICD-10-CM | POA: Diagnosis not present

## 2020-07-20 DIAGNOSIS — C770 Secondary and unspecified malignant neoplasm of lymph nodes of head, face and neck: Secondary | ICD-10-CM | POA: Diagnosis not present

## 2020-07-20 DIAGNOSIS — Z5111 Encounter for antineoplastic chemotherapy: Secondary | ICD-10-CM | POA: Diagnosis not present

## 2020-07-20 DIAGNOSIS — D702 Other drug-induced agranulocytosis: Secondary | ICD-10-CM

## 2020-07-20 DIAGNOSIS — T451X5A Adverse effect of antineoplastic and immunosuppressive drugs, initial encounter: Secondary | ICD-10-CM | POA: Diagnosis not present

## 2020-07-20 DIAGNOSIS — D701 Agranulocytosis secondary to cancer chemotherapy: Secondary | ICD-10-CM | POA: Diagnosis not present

## 2020-07-20 DIAGNOSIS — E1165 Type 2 diabetes mellitus with hyperglycemia: Secondary | ICD-10-CM | POA: Diagnosis not present

## 2020-07-20 MED ORDER — TBO-FILGRASTIM 480 MCG/0.8ML ~~LOC~~ SOSY
480.0000 ug | PREFILLED_SYRINGE | Freq: Once | SUBCUTANEOUS | Status: AC
  Administered 2020-07-20: 480 ug via SUBCUTANEOUS
  Filled 2020-07-20: qty 0.8

## 2020-07-20 NOTE — Telephone Encounter (Signed)
Appt has been changed per Dr. Jacinto Reap.

## 2020-07-21 ENCOUNTER — Other Ambulatory Visit (HOSPITAL_COMMUNITY): Payer: Self-pay

## 2020-07-21 ENCOUNTER — Encounter: Admission: RE | Payer: Self-pay | Source: Home / Self Care

## 2020-07-21 ENCOUNTER — Inpatient Hospital Stay: Payer: Medicare Other

## 2020-07-21 ENCOUNTER — Other Ambulatory Visit: Payer: Self-pay

## 2020-07-21 ENCOUNTER — Ambulatory Visit: Admission: RE | Admit: 2020-07-21 | Payer: Medicare Other | Source: Home / Self Care | Admitting: General Surgery

## 2020-07-21 DIAGNOSIS — E1165 Type 2 diabetes mellitus with hyperglycemia: Secondary | ICD-10-CM | POA: Diagnosis not present

## 2020-07-21 DIAGNOSIS — D701 Agranulocytosis secondary to cancer chemotherapy: Secondary | ICD-10-CM | POA: Diagnosis not present

## 2020-07-21 DIAGNOSIS — C770 Secondary and unspecified malignant neoplasm of lymph nodes of head, face and neck: Secondary | ICD-10-CM | POA: Diagnosis not present

## 2020-07-21 DIAGNOSIS — C3411 Malignant neoplasm of upper lobe, right bronchus or lung: Secondary | ICD-10-CM | POA: Diagnosis not present

## 2020-07-21 DIAGNOSIS — T451X5A Adverse effect of antineoplastic and immunosuppressive drugs, initial encounter: Secondary | ICD-10-CM | POA: Diagnosis not present

## 2020-07-21 DIAGNOSIS — D702 Other drug-induced agranulocytosis: Secondary | ICD-10-CM

## 2020-07-21 DIAGNOSIS — Z5111 Encounter for antineoplastic chemotherapy: Secondary | ICD-10-CM | POA: Diagnosis not present

## 2020-07-21 SURGERY — INSERTION, TUNNELED CENTRAL VENOUS DEVICE, WITH PORT
Anesthesia: Monitor Anesthesia Care

## 2020-07-21 MED ORDER — TBO-FILGRASTIM 480 MCG/0.8ML ~~LOC~~ SOSY
480.0000 ug | PREFILLED_SYRINGE | Freq: Once | SUBCUTANEOUS | Status: AC
  Administered 2020-07-21: 480 ug via SUBCUTANEOUS
  Filled 2020-07-21: qty 0.8

## 2020-07-25 ENCOUNTER — Other Ambulatory Visit (HOSPITAL_COMMUNITY): Payer: Self-pay

## 2020-07-25 ENCOUNTER — Other Ambulatory Visit: Payer: Self-pay | Admitting: Internal Medicine

## 2020-07-25 DIAGNOSIS — M170 Bilateral primary osteoarthritis of knee: Secondary | ICD-10-CM

## 2020-07-26 ENCOUNTER — Inpatient Hospital Stay (HOSPITAL_BASED_OUTPATIENT_CLINIC_OR_DEPARTMENT_OTHER): Payer: Medicare Other | Admitting: Internal Medicine

## 2020-07-26 ENCOUNTER — Other Ambulatory Visit: Payer: Self-pay | Admitting: *Deleted

## 2020-07-26 ENCOUNTER — Encounter: Payer: Self-pay | Admitting: Internal Medicine

## 2020-07-26 ENCOUNTER — Encounter: Payer: Self-pay | Admitting: Pharmacist

## 2020-07-26 ENCOUNTER — Inpatient Hospital Stay: Payer: Medicare Other

## 2020-07-26 ENCOUNTER — Inpatient Hospital Stay: Payer: Medicare Other | Attending: Internal Medicine

## 2020-07-26 ENCOUNTER — Other Ambulatory Visit: Payer: Self-pay | Admitting: Internal Medicine

## 2020-07-26 DIAGNOSIS — Z79899 Other long term (current) drug therapy: Secondary | ICD-10-CM | POA: Insufficient documentation

## 2020-07-26 DIAGNOSIS — C3411 Malignant neoplasm of upper lobe, right bronchus or lung: Secondary | ICD-10-CM | POA: Insufficient documentation

## 2020-07-26 DIAGNOSIS — Z7984 Long term (current) use of oral hypoglycemic drugs: Secondary | ICD-10-CM | POA: Diagnosis not present

## 2020-07-26 DIAGNOSIS — I10 Essential (primary) hypertension: Secondary | ICD-10-CM | POA: Insufficient documentation

## 2020-07-26 DIAGNOSIS — E119 Type 2 diabetes mellitus without complications: Secondary | ICD-10-CM | POA: Insufficient documentation

## 2020-07-26 LAB — CBC WITH DIFFERENTIAL/PLATELET
Abs Immature Granulocytes: 0.13 10*3/uL — ABNORMAL HIGH (ref 0.00–0.07)
Basophils Absolute: 0 10*3/uL (ref 0.0–0.1)
Basophils Relative: 1 %
Eosinophils Absolute: 0.1 10*3/uL (ref 0.0–0.5)
Eosinophils Relative: 3 %
HCT: 37.2 % (ref 36.0–46.0)
Hemoglobin: 12.1 g/dL (ref 12.0–15.0)
Immature Granulocytes: 4 %
Lymphocytes Relative: 27 %
Lymphs Abs: 0.9 10*3/uL (ref 0.7–4.0)
MCH: 26.9 pg (ref 26.0–34.0)
MCHC: 32.5 g/dL (ref 30.0–36.0)
MCV: 82.9 fL (ref 80.0–100.0)
Monocytes Absolute: 0.9 10*3/uL (ref 0.1–1.0)
Monocytes Relative: 26 %
Neutro Abs: 1.5 10*3/uL — ABNORMAL LOW (ref 1.7–7.7)
Neutrophils Relative %: 39 %
Platelets: 134 10*3/uL — ABNORMAL LOW (ref 150–400)
RBC: 4.49 MIL/uL (ref 3.87–5.11)
RDW: 13.9 % (ref 11.5–15.5)
WBC: 3.5 10*3/uL — ABNORMAL LOW (ref 4.0–10.5)
nRBC: 1.1 % — ABNORMAL HIGH (ref 0.0–0.2)

## 2020-07-26 LAB — COMPREHENSIVE METABOLIC PANEL
ALT: 17 U/L (ref 0–44)
AST: 25 U/L (ref 15–41)
Albumin: 3.1 g/dL — ABNORMAL LOW (ref 3.5–5.0)
Alkaline Phosphatase: 72 U/L (ref 38–126)
Anion gap: 10 (ref 5–15)
BUN: 16 mg/dL (ref 8–23)
CO2: 30 mmol/L (ref 22–32)
Calcium: 8.8 mg/dL — ABNORMAL LOW (ref 8.9–10.3)
Chloride: 96 mmol/L — ABNORMAL LOW (ref 98–111)
Creatinine, Ser: 0.91 mg/dL (ref 0.44–1.00)
GFR, Estimated: >60 mL/min (ref >60)
Glucose, Bld: 193 mg/dL — ABNORMAL HIGH (ref 70–99)
Potassium: 3.9 mmol/L (ref 3.5–5.1)
Sodium: 136 mmol/L (ref 135–145)
Total Bilirubin: 0.4 mg/dL (ref 0.3–1.2)
Total Protein: 6.9 g/dL (ref 6.5–8.1)

## 2020-07-26 MED ORDER — TRIAMCINOLONE ACETONIDE 0.5 % EX OINT: 1.0000 "application " | TOPICAL_OINTMENT | Freq: Every day | CUTANEOUS | 0 refills | Status: DC

## 2020-07-26 MED ORDER — GLIPIZIDE 10 MG PO TABS: 10.0000 mg | ORAL_TABLET | Freq: Every day | ORAL | 1 refills | Status: DC

## 2020-07-26 NOTE — Progress Notes (Signed)
Patient here for oncology follow-up appointment, expresses concerns of rash and leg swelling

## 2020-07-26 NOTE — Progress Notes (Signed)
[place]  [phone] [phone]  Patient Care Team: Glean Hess, MD as PCP - General (Internal Medicine) [facility] (Ophthalmology) Jannet Mantis, MD (Dermatology) Telford Nab, RN as Oncology Nurse Navigator   Name of the patient: Megan Shields  [id number]  1945/08/22   Date of visit: 07/26/20  HPI: Patient is a 76 y.o. female with newly diagnosed NSCLC, planned duration until disease progression or unacceptable drug toxicity. 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 She will now be switching to treatment with Tagrisso (osimertinib).  Reason for Consult: Tagrisso (osimertinib) oral chemotherapy education.   PAST MEDICAL HISTORY: Past Medical History:  Diagnosis Date  . Anemia    vitamin d deficiency  . Arthritis   . Chronic cough   . Depression   . Diabetes mellitus without complication ([place])   . Dyspnea   . Hypertension   . ILD (interstitial lung disease) ([place])   . Obesity   . Obesity    BMI 49.44  . Pneumonia   . Pneumonia due to 2019 novel coronavirus 2022   community acquired also. covid positive May 2022  . PONV (postoperative nausea and vomiting)   . Primary cancer of right upper lobe of lung ([place]) 06/30/2020  . Rosacea   . 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  [Dr.Byrnett & Dr.Fleming]  # MAY 11th, 2022- carbo-alimta  # NGS/MOLECULAR TESTS:    # PALLIATIVE CARE EVALUATION:  # PAIN MANAGEMENT:    DIAGNOSIS: Lung cancer  STAGE:   IIIC      ;  GOALS:Palliatve  CURRENT/MOST RECENT THERAPY : Carbo Alimta      Primary cancer of  right upper lobe of lung ([place])  06/30/2020 Initial Diagnosis   Primary cancer of right upper lobe of lung ([place])   06/30/2020 Cancer Staging   Staging form: Lung, AJCC 8th Edition - Clinical: Stage IIIC (cT4, cN3, cM0) - Signed by Cammie Sickle, MD on 06/30/2020 Histopathologic type: Adenocarcinoma, NOS   07/05/2020 -  Chemotherapy    Patient is on Treatment Plan: LUNG CARBOPLATIN / PEMETREXED / PEMBROLIZUMAB Q21D INDUCTION X 4 CYCLES / MAINTENANCE PEMETREXED + PEMBROLIZUMAB        ALLERGIES:  is allergic to pneumococcal vaccines, wound dressing adhesive, amoxicillin, codeine, and penicillins.  MEDICATIONS:  Current Outpatient Medications  Medication Sig Dispense Refill  . acetaminophen (TYLENOL) 500 MG tablet Take 1,000 mg by mouth at bedtime as needed for moderate pain.    . benzonatate (TESSALON) 200 MG capsule Take 200 mg by mouth 3 (three) times daily as needed for cough. (Patient not taking: No sig reported)    . celecoxib (CELEBREX) 200 MG capsule TAKE 1 CAPSULE DAILY 90 capsule 0  . chlorpheniramine-HYDROcodone (TUSSIONEX) 10-8 MG/5ML SUER Take 5 mLs by mouth at bedtime as needed for cough. 140 mL 0  . cholecalciferol (VITAMIN D3) 25 MCG (1000 UNIT) tablet Take 1,000 Units by mouth daily.    . folic acid (FOLVITE) 1 MG tablet Take 1 tablet (1 mg total) by mouth daily. 90 tablet 1  . FREESTYLE LITE test strip TEST BLOOD SUGAR TWICE A DAY 100 strip 6  . glipiZIDE (GLUCOTROL) 10 MG tablet Take 1  tablet (10 mg total) by mouth daily before breakfast. 90 tablet 1  . Lancets (FREESTYLE) lancets TEST BLOOD SUGAR TWICE A DAY 100 each 6  . levofloxacin (LEVAQUIN) 500 MG tablet Take 1 tablet (500 mg total) by mouth daily. (Patient not taking: Reported on 07/26/2020) 10 tablet 0  . lidocaine-prilocaine (EMLA) cream Apply 30 -45 mins prior to port access. (Patient not taking: Reported on 07/26/2020) 30 g 0  . metFORMIN (GLUCOPHAGE) 500 MG tablet TAKE 1 TABLET THREE TIMES A DAY AFTER MEALS  (Patient not taking: No sig reported) 270 tablet 1  . olmesartan-hydrochlorothiazide (BENICAR HCT) 40-25 MG tablet Take 1 tablet by mouth daily. 90 tablet 3  . ondansetron (ZOFRAN) 8 MG tablet One pill every 8 hours as needed for nausea/vomitting. (Patient not taking: No sig reported) 40 tablet 1  . osimertinib mesylate (TAGRISSO) 80 MG tablet Take 1 tablet (80 mg total) by mouth daily. 30 tablet 2  . prochlorperazine (COMPAZINE) 10 MG tablet Take 1 tablet (10 mg total) by mouth every 6 (six) hours as needed for nausea or vomiting. (Patient not taking: No sig reported) 40 tablet 1  . triamcinolone ointment (KENALOG) 0.5 % Apply 1 application topically daily. 30 g 0   No current facility-administered medications for this visit.    VITAL SIGNS: There were no vitals taken for this visit. There were no vitals filed for this visit.  Estimated body mass index is 49.09 kg/m as calculated from the following:   Height as of 07/17/20: '5\' 4"'  (1.626 m).   Weight as of an earlier encounter on 07/26/20: 129.7 kg (286 lb).  LABS: CBC:    Component Value Date/Time   WBC 3.5 (L) 07/26/2020 0814   HGB 12.1 07/26/2020 0814   HGB 14.5 10/06/2019 0934   HCT 37.2 07/26/2020 0814   HCT 46.0 10/06/2019 0934   PLT 134 (L) 07/26/2020 0814   PLT 248 10/06/2019 0934   MCV 82.9 07/26/2020 0814   MCV 86 10/06/2019 0934   MCV 86 01/22/2012 1355   NEUTROABS 1.5 (L) 07/26/2020 0814   NEUTROABS 2.1 10/06/2019 0934   LYMPHSABS 0.9 07/26/2020 0814   LYMPHSABS 1.1 10/06/2019 0934   MONOABS 0.9 07/26/2020 0814   EOSABS 0.1 07/26/2020 0814   EOSABS 0.2 10/06/2019 0934   BASOSABS 0.0 07/26/2020 0814   BASOSABS 0.0 10/06/2019 0934   Comprehensive Metabolic Panel:    Component Value Date/Time   NA 136 07/26/2020 0814   NA 140 10/06/2019 0934   NA 136 01/22/2012 1355   K 3.9 07/26/2020 0814   K 4.0 01/22/2012 1355   CL 96 (L) 07/26/2020 0814   CL 100 01/22/2012 1355   CO2 30 07/26/2020 0814   CO2 32  01/22/2012 1355   BUN 16 07/26/2020 0814   BUN 19 10/06/2019 0934   BUN 13 01/22/2012 1355   CREATININE 0.91 07/26/2020 0814   CREATININE 0.93 10/13/2012 1127   GLUCOSE 193 (H) 07/26/2020 0814   GLUCOSE 171 (H) 01/22/2012 1355   CALCIUM 8.8 (L) 07/26/2020 0814   CALCIUM 9.1 01/22/2012 1355   AST 25 07/26/2020 0814   ALT 17 07/26/2020 0814   ALKPHOS 72 07/26/2020 0814   BILITOT 0.4 07/26/2020 0814   BILITOT 0.4 10/06/2019 0934   PROT 6.9 07/26/2020 0814   PROT 7.0 10/06/2019 0934   ALBUMIN 3.1 (L) 07/26/2020 0814   ALBUMIN 3.8 10/06/2019 0934     Present during today's visit: patient and her husband  Assessment and Plan: Start plan:  Ms. Hesler will start her Tagrisso on 07/31/20   Patient Education Administration: Counseled patient on administration, dosing, side effects, monitoring, drug-food interactions, safe handling, storage, and disposal. Patient will take 1 tablet (80 mg total) by mouth daily.  Side Effects: Side effects include but not limited to: diarrhea, rash, decreased wbc/plt/hgb.    Adherence: After discussion with patient no patient barriers to medication adherence identified.  Reviewed with patient importance of keeping a medication schedule and plan for any missed doses.  Ms. Fleece voiced understanding and appreciation. All questions answered. Medication handout provided.  Provided patient with [facility] phone number. Patient knows to call the office with questions or concerns. [facility] will continue to follow.  Patient expressed understanding and was in agreement with this plan. She also understands that She can call clinic at any time with any questions, concerns, or complaints.   Medication Access Issues: No issues, patient fill her medication at [facility] and it will be delivered today   Thank you for allowing me to participate in the care of this patient.   Time Total: 20 mins  Visit consisted of  counseling and education on dealing with issues of symptom management in the setting of serious and potentially life-threatening illness.Greater than 50%  of this time was spent counseling and coordinating care related to the above assessment and plan.  Signed by: Darl Pikes, PharmD, BCPS, Salley Slaughter, CPP Hematology/Oncology Clinical Pharmacist Practitioner [facility]/HP/AP [facility] [phone]  07/26/2020 11:46 AM

## 2020-07-26 NOTE — Assessment & Plan Note (Addendum)
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  Locally advanced lung cancer- s/p carbo-alimta #1. -However NGS- EGFR mutated- Exon 19 del.   # recommend discontinuation of chemotherapy.  Recommend starting patient on osimertinib.  Had a long discussion with patient and husband regarding the mechanism of action/targeted therapy.  Patient and wife understand treatments are palliative and not curative.  Response rates are up to 70%; median PFS).  Discussed the potential side effects including but not limited to skin rash nausea vomiting diarrhea as predominant side effects.  Recommend 80 mg once a day.  Patient awaiting shipment.  Plan to start on 6/06.   # skin rash-upper torso.  Sec to chemo vs levaquin.  Discontinue Levaquin; start kenalog.   #Bronchitis/pneumonia-s/p Levaquin 7 days; stop Levaquin at this time-pes planus: Osimertinib  # DM- 140 [FBS- 150] ; metformin- diarrhea; on  glipizide 10 mg AC; [goal- PP- 250]  # Cough secondary to malignancy-improved; not resolved Tussionex nightly as needed.  # DISPOSITION:..  # follow up in in 3 weeks [mebane]- MD; labs- cbc/cmp-Dr.B  # 40 minutes face-to-face with the patient discussing the above plan of care; more than 50% of time spent on prognosis/ natural history; counseling and coordination.

## 2020-07-26 NOTE — Progress Notes (Signed)
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  [Dr.Byrnett & Dr.Fleming]; May 2022-MRI brain-NEG.   # MAY 11th, 2022- carbo-alimta x1 cycle; discontinued; June 6th, 2022-osimertinib 80 mg a day  # NGS/MOLECULAR TESTS:POSITIVE for EGFR mutation deletion 19    # PALLIATIVE CARE EVALUATION:  # PAIN MANAGEMENT:    DIAGNOSIS: Lung cancer  STAGE:   IIIC      ;  GOALS:Palliatve  CURRENT/MOST RECENT THERAPY : Osiemertinib      Primary cancer of right upper lobe of lung ([place])  06/30/2020 Initial Diagnosis   Primary cancer of right upper lobe of lung ([place])   06/30/2020 Cancer Staging   Staging form: Lung, AJCC 8th Edition - Clinical: Stage IIIC (cT4, cN3, cM0) - Signed by Cammie Sickle, MD on 06/30/2020 Histopathologic type: Adenocarcinoma, NOS   07/05/2020 -  Chemotherapy    Patient is on Treatment Plan: LUNG CARBOPLATIN / PEMETREXED / PEMBROLIZUMAB Q21D INDUCTION X 4 CYCLES / MAINTENANCE PEMETREXED + PEMBROLIZUMAB         HISTORY OF PRESENTING ILLNESS:  Megan Shields 75 y.o.  female  Stage IIIC/adenocarcinoma the lung s/p cycle #1 of carbo-alimat is here for a follow up.  In the interim patient was diagnosed with COVID infection.  She is s/p monoclonal antibodies.  She is also treated with Levaquin for bronchitis.  Denies any worsening shortness of breath or cough.  She continues nasal cannula oxygen; however as needed.  Complains of a rash on the torso mildly itchy.  Review of Systems  Constitutional: Positive for  malaise/fatigue. Negative for chills, diaphoresis, fever and weight loss.  HENT: Negative for nosebleeds and sore throat.   Eyes: Negative for double vision.  Respiratory: Positive for cough and shortness of breath. Negative for hemoptysis, sputum production and wheezing.   Cardiovascular: Negative for chest pain, palpitations, orthopnea and leg swelling.  Gastrointestinal: Negative for abdominal pain, blood in stool, constipation, diarrhea, heartburn, melena, nausea and vomiting.  Genitourinary: Negative for dysuria, frequency and urgency.  Musculoskeletal: Negative for back pain and joint pain.  Skin: Positive for itching and rash.  Neurological: Negative for dizziness, tingling, focal weakness, weakness and headaches.  Endo/Heme/Allergies: Does not bruise/bleed easily.  Psychiatric/Behavioral: Negative for depression. The patient is not nervous/anxious and does not have insomnia.      MEDICAL HISTORY:  Past Medical History:  Diagnosis Date  . Anemia    vitamin d deficiency  . Arthritis   . Chronic cough   . Depression   . Diabetes mellitus without complication ([place])   . Dyspnea   . Hypertension   . ILD (interstitial lung disease) ([place])   . Obesity   . Obesity    BMI 49.44  . Pneumonia   . Pneumonia due to 2019 novel coronavirus 2022   community acquired also. covid positive May 2022  . PONV (postoperative nausea and vomiting)   . Primary cancer of right upper lobe of lung ([place]) 06/30/2020  . Rosacea   . Shoulder contusion 11/22/2015    SURGICAL HISTORY: Past Surgical History:  Procedure Laterality Date  . BLEPHAROPLASTY Bilateral 12/19/2017  . CARPAL TUNNEL RELEASE Bilateral 1985  .  CHOLECYSTECTOMY    . COLONOSCOPY WITH PROPOFOL N/A 03/07/2017   Procedure: COLONOSCOPY WITH PROPOFOL;  Surgeon: [name], MD;  Location[facility] ENDOSCOPY;  Service: Gastroenterology;  Laterality: N/A;  . DILATION AND CURETTAGE OF UTERUS  12/2011   benign endometrial polyp  . HALLUX VALGUS  [place] Right 09/02/2014   Procedure: [place];  Surgeon: Samara Deist, DPM;  Location: [facility] ORS;  Service: Podiatry;  Laterality: Right;  . HERNIA REPAIR    . JOINT REPLACEMENT Bilateral    TKR  . LAPAROSCOPIC GASTRIC BANDING  2010   Lap Band  . LAPAROSCOPIC TOTAL HYSTERECTOMY  12/23/2019  . lymph node removed Right   . TOTAL KNEE ARTHROPLASTY Bilateral 2003, 2004  . TOTAL KNEE ARTHROPLASTY Bilateral   . VENTRAL HERNIA REPAIR  2008    SOCIAL HISTORY: Social History   Socioeconomic History  . Marital status: Married    Spouse name: Fritz Pickerel  . Number of children: Not on file  . Years of education: Not on file  . Highest education level: Not on file  Occupational History  . Occupation: Probation officer    Comment: RETIRED  Tobacco Use  . Smoking status: Never Smoker  . Smokeless tobacco: Never Used  Vaping Use  . Vaping Use: Never used  Substance and Sexual Activity  . Alcohol use: No    Alcohol/week: 0.0 standard drinks  . Drug use: No  . Sexual activity: Not Currently  Other Topics Concern  . Not on file  Social History Narrative   Never smoked; no alcohol. Lives in [place] with husband . Home maker.    Daughter lives next door. 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 Difficulty of Paying Living Expenses: Not hard at all  Food Insecurity: No Food Insecurity  . Worried About Charity fundraiser in the Last Year: Never true  . Ran Out of Food in the Last Year: Never true  Transportation Needs: No Transportation Needs  . Lack of Transportation (Medical): No  . Lack of Transportation (Non-Medical): No  Physical Activity: Insufficiently Active  . Days of Exercise per Week: 3 days  . Minutes of Exercise per Session: 30 min  Stress: No Stress Concern Present  . Feeling of Stress : Not at all  Social Connections: Moderately Isolated  . Frequency of Communication with Friends and  Family: More than three times a week  . Frequency of Social Gatherings with Friends and Family: More than three times a week  . Attends Religious Services: Never  . Active Member of Clubs or Organizations: No  . Attends Archivist Meetings: Never  . Marital Status: Married  Human resources officer Violence: Not At Risk  . Fear of Current or Ex-Partner: No  . Emotionally Abused: No  . Physically Abused: No  . Sexually Abused: No    FAMILY HISTORY: Family History  Problem Relation Age of Onset  . Breast cancer Mother 76  . Breast cancer Paternal Aunt        3 pat aunts  . Liver disease Brother        died 82    ALLERGIES:  is allergic to pneumococcal vaccines, wound dressing adhesive, amoxicillin, codeine, and penicillins.  MEDICATIONS:  Current Outpatient Medications  Medication Sig Dispense Refill  . acetaminophen (TYLENOL) 500 MG tablet Take 1,000 mg by mouth at bedtime as needed for moderate pain.    . celecoxib (  CELEBREX) 200 MG capsule TAKE 1 CAPSULE DAILY 90 capsule 0  . chlorpheniramine-HYDROcodone (TUSSIONEX) 10-8 MG/5ML SUER Take 5 mLs by mouth at bedtime as needed for cough. 140 mL 0  . cholecalciferol (VITAMIN D3) 25 MCG (1000 UNIT) tablet Take 1,000 Units by mouth daily.    . folic acid (FOLVITE) 1 MG tablet Take 1 tablet (1 mg total) by mouth daily. 90 tablet 1  . FREESTYLE LITE test strip TEST BLOOD SUGAR TWICE A DAY 100 strip 6  . Lancets (FREESTYLE) lancets TEST BLOOD SUGAR TWICE A DAY 100 each 6  . olmesartan-hydrochlorothiazide (BENICAR HCT) 40-25 MG tablet Take 1 tablet by mouth daily. 90 tablet 3  . osimertinib mesylate (TAGRISSO) 80 MG tablet Take 1 tablet (80 mg total) by mouth daily. 30 tablet 2  . triamcinolone ointment (KENALOG) 0.5 % Apply 1 application topically daily. 30 g 0  . benzonatate (TESSALON) 200 MG capsule Take 200 mg by mouth 3 (three) times daily as needed for cough. (Patient not taking: No sig reported)    . glipiZIDE (GLUCOTROL) 10 MG  tablet Take 1 tablet (10 mg total) by mouth daily before breakfast. 90 tablet 1  . lidocaine-prilocaine (EMLA) cream Apply 30 -45 mins prior to port access. (Patient not taking: Reported on 07/26/2020) 30 g 0  . metFORMIN (GLUCOPHAGE) 500 MG tablet TAKE 1 TABLET THREE TIMES A DAY AFTER MEALS (Patient not taking: No sig reported) 270 tablet 1  . ondansetron (ZOFRAN) 8 MG tablet One pill every 8 hours as needed for nausea/vomitting. (Patient not taking: No sig reported) 40 tablet 1  . prochlorperazine (COMPAZINE) 10 MG tablet Take 1 tablet (10 mg total) by mouth every 6 (six) hours as needed for nausea or vomiting. (Patient not taking: No sig reported) 40 tablet 1   No current facility-administered medications for this visit.      Marland Kitchen  PHYSICAL EXAMINATION: ECOG PERFORMANCE STATUS: 0 - Asymptomatic  Vitals:   07/26/20 0846  BP: (!) 153/82  Pulse: 68  Resp: 20  Temp: 98.5 F (36.9 C)  SpO2: 95%   Filed Weights   07/26/20 0846  Weight: 129.7 kg    Physical Exam Constitutional:      Comments: Obese.  She is accompanied by husband.  Ambulating independently.  She is on 2 L of nasal cannula oxygen.  HENT:     Head: Normocephalic and atraumatic.     Mouth/Throat:     Pharynx: No oropharyngeal exudate.  Eyes:     Pupils: Pupils are equal, round, and reactive to light.  Cardiovascular:     Rate and Rhythm: Normal rate and regular rhythm.  Pulmonary:     Effort: No respiratory distress.     Breath sounds: No wheezing.     Comments: Bilateral basilar crackles.  Decreased breath sounds.  No wheeze. Abdominal:     General: Bowel sounds are normal. There is no distension.     Palpations: Abdomen is soft. There is no mass.     Tenderness: There is no abdominal tenderness. There is no guarding or rebound.  Musculoskeletal:        General: No tenderness. Normal range of motion.     Cervical back: Normal range of motion and neck supple.  Skin:    General: Skin is warm.     Comments:  Maculopapular rash torso/upper; upper extremities.  Neurological:     Mental Status: She is alert and oriented to person, place, and time.  Psychiatric:  Mood and Affect: Affect normal.      LABORATORY DATA:  I have reviewed the data as listed Lab Results  Component Value Date   WBC 3.5 (L) 07/26/2020   HGB 12.1 07/26/2020   HCT 37.2 07/26/2020   MCV 82.9 07/26/2020   PLT 134 (L) 07/26/2020   Recent Labs    10/06/19 0934 06/30/20 1538 07/13/20 0922 07/17/20 1241 07/26/20 0814  NA 140   < > 133* 134* 136  K 4.6   < > 3.8 3.5 3.9  CL 98   < > 91* 95* 96*  CO2 27   < > '30 27 30  ' GLUCOSE 123*   < > 163* 127* 193*  BUN 19   < > '18 20 16  ' CREATININE 0.86   < > 0.94 0.81 0.91  CALCIUM 9.8   < > 8.8* 8.8* 8.8*  GFRNONAA 68   < > >60 >60 >60  GFRAA 79  --   --   --   --   PROT 7.0   < > 7.6 7.6 6.9  ALBUMIN 3.8   < > 3.3* 3.4* 3.1*  AST 18   < > '27 27 25  ' ALT 13   < > '17 18 17  ' ALKPHOS 61   < > 47 52 72  BILITOT 0.4   < > 1.0 0.7 0.4   < > = values in this interval not displayed.    RADIOGRAPHIC STUDIES: I have personally reviewed the radiological images as listed and agreed with the findings in the report. MR Brain W Wo Contrast  Result Date: 07/05/2020 CLINICAL DATA:  Malignant neoplasm of unspecified part of unspecified bronchus or lung. Non-small cell lung cancer, staging. EXAM: MRI HEAD WITHOUT AND WITH CONTRAST TECHNIQUE: Multiplanar, multiecho pulse sequences of the brain and surrounding structures were obtained without and with intravenous contrast. CONTRAST:  79m GADAVIST GADOBUTROL 1 MMOL/ML IV SOLN COMPARISON:  None. FINDINGS: Brain: No acute infarction, hemorrhage, hydrocephalus, extra-axial collection or mass lesion. Scattered foci of T2 hyperintensity are seen within the white matter of the cerebral hemispheres, nonspecific. No focus of abnormal contrast enhancement. Vascular: Normal flow voids. Skull and upper cervical spine: Normal marrow signal.  Sinuses/Orbits: Negative. Other: None. IMPRESSION: 1. No evidence of intracranial metastatic disease. 2. Mild chronic microvascular ischemic changes of the white matter. Electronically Signed   By: KPedro EarlsM.D.   On: 07/05/2020 13:11   DG Chest Portable 1 View  Result Date: 07/13/2020 CLINICAL DATA:  Shortness of breath. EXAM: PORTABLE CHEST 1 VIEW COMPARISON:  PET-CT 06/13/2020.  Chest CT 05/23/2020. FINDINGS: Mediastinum stable. Heart size stable. Diffuse nodular interstitial changes are again noted without interim change. Reference is made to PET-CT report of 06/13/2020 for discussion of right pulmonary mass and changes of metastatic disease. No pleural effusion or pneumothorax. IMPRESSION: Diffuse nodular interstitial changes are again noted without interim change. Reference is made to prior PET-CT report of 06/13/2020 for discussion of right pulmonary mass and changes of metastatic disease. 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  Locally advanced lung cancer- s/p carbo-alimta #1. -However NGS- EGFR mutated- Exon 19 del.   # recommend discontinuation of chemotherapy.  Recommend starting patient on osimertinib.  Had a long discussion with patient and husband regarding the  mechanism of action/targeted therapy.  Patient and wife understand treatments are palliative and not curative.  Response rates are up to 70%; median PFS).  Discussed the potential side effects including but not limited to skin rash nausea vomiting diarrhea as predominant side effects.  Recommend 80 mg once a day.  Patient awaiting shipment.  Plan to start on 6/06.   # skin rash-upper torso.  Sec to chemo vs levaquin.  Discontinue Levaquin; start kenalog.   #Bronchitis/pneumonia-s/p Levaquin 7 days;  stop Levaquin at this time-pes planus: Osimertinib  # DM- 140 [FBS- 150] ; metformin- diarrhea; on  glipizide 10 mg AC; [goal- PP- 250]  # Cough secondary to malignancy-improved; not resolved Tussionex nightly as needed.  # DISPOSITION:..  # follow up in in 3 weeks [[place]]- MD; labs- cbc/cmp-Dr.B  # 40 minutes face-to-face with the patient discussing the above plan of care; more than 50% of time spent on prognosis/ natural history; counseling and coordination.   All questions were answered. The patient knows to call the clinic with any problems, questions or concerns.    Cammie Sickle, MD 07/27/2020 8:26 AM

## 2020-07-26 NOTE — Patient Instructions (Signed)
#   STOP levaquin # recommend kenalog bid on rash once a day

## 2020-08-02 ENCOUNTER — Other Ambulatory Visit (HOSPITAL_COMMUNITY): Payer: Self-pay

## 2020-08-07 ENCOUNTER — Other Ambulatory Visit: Payer: Self-pay | Admitting: *Deleted

## 2020-08-07 MED ORDER — FOLIC ACID 1 MG PO TABS: 1.0000 mg | ORAL_TABLET | Freq: Every day | ORAL | 0 refills | Status: DC

## 2020-08-10 ENCOUNTER — Encounter: Payer: Self-pay | Admitting: Internal Medicine

## 2020-08-10 ENCOUNTER — Other Ambulatory Visit: Payer: Self-pay | Admitting: *Deleted

## 2020-08-10 MED ORDER — GLIPIZIDE 10 MG PO TABS: 10.0000 mg | ORAL_TABLET | Freq: Every day | ORAL | 1 refills | Status: DC

## 2020-08-10 NOTE — Telephone Encounter (Signed)
Error

## 2020-08-11 ENCOUNTER — Encounter: Payer: Self-pay | Admitting: Internal Medicine

## 2020-08-13 ENCOUNTER — Encounter: Payer: Self-pay | Admitting: Internal Medicine

## 2020-08-16 ENCOUNTER — Other Ambulatory Visit (HOSPITAL_COMMUNITY): Payer: Self-pay

## 2020-08-18 ENCOUNTER — Inpatient Hospital Stay (HOSPITAL_BASED_OUTPATIENT_CLINIC_OR_DEPARTMENT_OTHER): Payer: Medicare Other | Admitting: Internal Medicine

## 2020-08-18 ENCOUNTER — Other Ambulatory Visit: Payer: Self-pay

## 2020-08-18 ENCOUNTER — Inpatient Hospital Stay: Payer: Medicare Other

## 2020-08-18 VITALS — BP 144/86 | HR 70 | Temp 96.3°F | Resp 20 | Ht 64.0 in | Wt 284.4 lb

## 2020-08-18 DIAGNOSIS — C3411 Malignant neoplasm of upper lobe, right bronchus or lung: Secondary | ICD-10-CM

## 2020-08-18 DIAGNOSIS — E119 Type 2 diabetes mellitus without complications: Secondary | ICD-10-CM | POA: Diagnosis not present

## 2020-08-18 DIAGNOSIS — Z7984 Long term (current) use of oral hypoglycemic drugs: Secondary | ICD-10-CM | POA: Diagnosis not present

## 2020-08-18 DIAGNOSIS — I10 Essential (primary) hypertension: Secondary | ICD-10-CM | POA: Diagnosis not present

## 2020-08-18 DIAGNOSIS — Z79899 Other long term (current) drug therapy: Secondary | ICD-10-CM | POA: Diagnosis not present

## 2020-08-18 LAB — COMPREHENSIVE METABOLIC PANEL
ALT: 15 U/L (ref 0–44)
AST: 23 U/L (ref 15–41)
Albumin: 3.6 g/dL (ref 3.5–5.0)
Alkaline Phosphatase: 52 U/L (ref 38–126)
Anion gap: 6 (ref 5–15)
BUN: 19 mg/dL (ref 8–23)
CO2: 31 mmol/L (ref 22–32)
Calcium: 9.3 mg/dL (ref 8.9–10.3)
Chloride: 99 mmol/L (ref 98–111)
Creatinine, Ser: 1.06 mg/dL — ABNORMAL HIGH (ref 0.44–1.00)
GFR, Estimated: 56 mL/min — ABNORMAL LOW (ref >60)
Glucose, Bld: 87 mg/dL (ref 70–99)
Potassium: 3.8 mmol/L (ref 3.5–5.1)
Sodium: 136 mmol/L (ref 135–145)
Total Bilirubin: 0.6 mg/dL (ref 0.3–1.2)
Total Protein: 7.9 g/dL (ref 6.5–8.1)

## 2020-08-18 LAB — CBC WITH DIFFERENTIAL/PLATELET
Abs Immature Granulocytes: 0.01 10*3/uL (ref 0.00–0.07)
Basophils Absolute: 0 10*3/uL (ref 0.0–0.1)
Basophils Relative: 1 %
Eosinophils Absolute: 0.2 10*3/uL (ref 0.0–0.5)
Eosinophils Relative: 5 %
HCT: 39.9 % (ref 36.0–46.0)
Hemoglobin: 13.1 g/dL (ref 12.0–15.0)
Immature Granulocytes: 0 %
Lymphocytes Relative: 23 %
Lymphs Abs: 0.8 10*3/uL (ref 0.7–4.0)
MCH: 28.1 pg (ref 26.0–34.0)
MCHC: 32.8 g/dL (ref 30.0–36.0)
MCV: 85.4 fL (ref 80.0–100.0)
Monocytes Absolute: 0.5 10*3/uL (ref 0.1–1.0)
Monocytes Relative: 15 %
Neutro Abs: 1.9 10*3/uL (ref 1.7–7.7)
Neutrophils Relative %: 56 %
Platelets: 190 10*3/uL (ref 150–400)
RBC: 4.67 MIL/uL (ref 3.87–5.11)
RDW: 16.6 % — ABNORMAL HIGH (ref 11.5–15.5)
WBC: 3.5 10*3/uL — ABNORMAL LOW (ref 4.0–10.5)
nRBC: 0 % (ref 0.0–0.2)

## 2020-08-18 NOTE — Progress Notes (Signed)
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  [Dr.Byrnett & Dr.Fleming]; May 2022-MRI brain-NEG.   # MAY 11th, 2022- carbo-alimta x1 cycle; discontinued; June 6th, 2022-osimertinib 80 mg a day  # NGS/MOLECULAR TESTS:POSITIVE for EGFR mutation deletion 19    # PALLIATIVE CARE EVALUATION:  # PAIN MANAGEMENT:    DIAGNOSIS: Lung cancer  STAGE:   IIIC      ;  GOALS:Palliatve  CURRENT/MOST RECENT THERAPY : Osiemertinib      Primary cancer of right upper lobe of lung ([place])  06/30/2020 Initial Diagnosis   Primary cancer of right upper lobe of lung ([place])   06/30/2020 Cancer Staging   Staging form: Lung, AJCC 8th Edition - Clinical: Stage IIIC (cT4, cN3, cM0) - Signed by Cammie Sickle, MD on 06/30/2020  Histopathologic type: Adenocarcinoma, NOS    07/05/2020 -  Chemotherapy    Patient is on Treatment Plan: LUNG CARBOPLATIN / PEMETREXED / PEMBROLIZUMAB Q21D INDUCTION X 4 CYCLES / MAINTENANCE PEMETREXED + PEMBROLIZUMAB          HISTORY OF PRESENTING ILLNESS:  Megan Shields 75 y.o.  female  Stage IIIC/adenocarcinoma the lung EGFR mutated-currently on osimertinib is here for follow-up.  Patient had significant improvement of respiratory status she is currently off oxygen.  She denies any diarrhea.  Denies any skin rash.  Also noted to have significant improvement of the cough.   Review of Systems  Constitutional:  Positive for malaise/fatigue. Negative for chills, diaphoresis, fever and weight loss.  HENT:  Negative for nosebleeds  and sore throat.   Eyes:  Negative for double vision.  Respiratory:  Negative for hemoptysis, sputum production and wheezing.   Cardiovascular:  Negative for chest pain, palpitations, orthopnea and leg swelling.  Gastrointestinal:  Negative for abdominal pain, blood in stool, constipation, diarrhea, heartburn, melena, nausea and vomiting.  Genitourinary:  Negative for dysuria, frequency and urgency.  Musculoskeletal:  Negative for back pain and joint pain.  Neurological:  Negative for dizziness, tingling, focal weakness, weakness and headaches.  Endo/Heme/Allergies:  Does not bruise/bleed easily.  Psychiatric/Behavioral:  Negative for depression. The patient is not nervous/anxious and does not have insomnia.     MEDICAL HISTORY:  Past Medical History:  Diagnosis Date  . Anemia    vitamin d deficiency  . Arthritis   . Chronic cough   . Depression   . Diabetes mellitus without complication ([place])   . Dyspnea   . Hypertension   . ILD (interstitial lung disease) ([place])   . Obesity   . Obesity    BMI 49.44  . Pneumonia   . Pneumonia due to 2019 novel coronavirus 2022   community acquired also. covid positive May 2022  . PONV (postoperative nausea and vomiting)   . Primary cancer of right upper lobe of lung ([place]) 06/30/2020  . Rosacea   . Shoulder contusion 11/22/2015    SURGICAL HISTORY: Past Surgical History:  Procedure Laterality Date  . BLEPHAROPLASTY Bilateral 12/19/2017  . CARPAL TUNNEL RELEASE Bilateral 1985  . CHOLECYSTECTOMY    . COLONOSCOPY WITH PROPOFOL N/A 03/07/2017   Procedure: COLONOSCOPY WITH PROPOFOL;  Surgeon: [name], MD;  Location: [facility] ENDOSCOPY;  Service: Gastroenterology;  Laterality: N/A;  . DILATION AND CURETTAGE OF UTERUS  12/2011   benign endometrial polyp  . HALLUX VALGUS AUSTIN Right 09/02/2014   Procedure: [place];  Surgeon: Samara Deist, DPM;  Location: [facility] ORS;  Service: Podiatry;  Laterality: Right;  . HERNIA REPAIR    . JOINT  REPLACEMENT Bilateral    TKR  . LAPAROSCOPIC GASTRIC BANDING  2010   Lap Band  . LAPAROSCOPIC TOTAL HYSTERECTOMY  12/23/2019  . lymph node removed Right   . TOTAL KNEE ARTHROPLASTY Bilateral 2003, 2004  . TOTAL KNEE ARTHROPLASTY Bilateral   . VENTRAL HERNIA REPAIR  2008    SOCIAL HISTORY: Social History   Socioeconomic History  . Marital status: Married    Spouse name: Fritz Pickerel  . Number of children: Not on file  . Years of education: Not on file  . Highest education level: Not on file  Occupational History  . Occupation: Probation officer    Comment: RETIRED  Tobacco Use  . Smoking status: Never  . Smokeless tobacco: Never  Vaping Use  . Vaping Use: Never used  Substance and Sexual Activity  . Alcohol use: No    Alcohol/week: 0.0 standard drinks  . Drug use: No  . Sexual activity: Not Currently  Other Topics Concern  . Not on file  Social History Narrative   Never smoked; no alcohol. Lives in [place] with husband . Home maker.    Daughter lives next door. 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 Difficulty of Paying Living Expenses: Not hard at all  Food Insecurity: No Food Insecurity  . Worried About Charity fundraiser in the Last Year: Never true  . Ran Out of Food in the Last Year: Never true  Transportation Needs: No Transportation Needs  . Lack of Transportation (Medical): No  . Lack of Transportation (Non-Medical): No  Physical Activity: Insufficiently Active  . Days of Exercise per Week: 3 days  . Minutes of Exercise per Session: 30 min  Stress: No Stress Concern Present  . Feeling of Stress : Not at all  Social Connections: Moderately Isolated  . Frequency of Communication with Friends and Family: More than three times a week  . Frequency of Social Gatherings with Friends and Family: More than three times a week  . Attends Religious Services: Never  . Active Member of Clubs or  Organizations: No  . Attends Archivist Meetings: Never  . Marital Status: Married  Human resources officer Violence: Not At Risk  . Fear of Current or Ex-Partner: No  . Emotionally Abused: No  . Physically Abused: No  . Sexually Abused: No    FAMILY HISTORY: Family History  Problem Relation Age of Onset  . Breast cancer Mother 52  . Breast cancer Paternal Aunt        3 pat aunts  . Liver disease Brother        died 45    ALLERGIES:  is allergic to pneumococcal vaccines, wound dressing adhesive, amoxicillin, codeine, and penicillins.  MEDICATIONS:  Current Outpatient Medications  Medication Sig Dispense Refill  . celecoxib (CELEBREX) 200 MG capsule TAKE 1 CAPSULE DAILY 90 capsule 0  . cholecalciferol (VITAMIN D3) 25 MCG (1000 UNIT) tablet Take 1,000 Units by mouth daily.    . folic acid (FOLVITE) 1 MG tablet Take 1 tablet (1 mg total) by mouth  daily. 90 tablet 0  . FREESTYLE LITE test strip TEST BLOOD SUGAR TWICE A DAY 100 strip 6  . glipiZIDE (GLUCOTROL) 10 MG tablet Take 1 tablet (10 mg total) by mouth daily before breakfast. 90 tablet 1  . Lancets (FREESTYLE) lancets TEST BLOOD SUGAR TWICE A DAY 100 each 6  . olmesartan-hydrochlorothiazide (BENICAR HCT) 40-25 MG tablet Take 1 tablet by mouth daily. 90 tablet 3  . ondansetron (ZOFRAN) 8 MG tablet One pill every 8 hours as needed for nausea/vomitting. 40 tablet 1  . osimertinib mesylate (TAGRISSO) 80 MG tablet Take 1 tablet (80 mg total) by mouth daily. 30 tablet 2  . prochlorperazine (COMPAZINE) 10 MG tablet Take 1 tablet (10 mg total) by mouth every 6 (six) hours as needed for nausea or vomiting. 40 tablet 1  . acetaminophen (TYLENOL) 500 MG tablet Take 1,000 mg by mouth at bedtime as needed for moderate pain. (Patient not taking: Reported on 08/18/2020)     No current facility-administered medications for this visit.      Marland Kitchen  PHYSICAL EXAMINATION: ECOG PERFORMANCE STATUS: 0 - Asymptomatic  Vitals:   08/18/20 1132   BP: (!) 144/86  Pulse: 70  Resp: 20  Temp: (!) 96.3 F (35.7 C)   Filed Weights   08/18/20 1132  Weight: 284 lb 6.3 oz (129 kg)    Physical Exam Constitutional:      Comments: Obese.  She is accompanied by husband.  Ambulating independently.  On room air.  HENT:     Head: Normocephalic and atraumatic.     Mouth/Throat:     Pharynx: No oropharyngeal exudate.  Eyes:     Pupils: Pupils are equal, round, and reactive to light.  Cardiovascular:     Rate and Rhythm: Normal rate and regular rhythm.  Pulmonary:     Effort: No respiratory distress.     Breath sounds: No wheezing.     Comments: Clear to auscultation bilaterally. Abdominal:     General: Bowel sounds are normal. There is no distension.     Palpations: Abdomen is soft. There is no mass.     Tenderness: There is no abdominal tenderness. There is no guarding or rebound.  Musculoskeletal:        General: No tenderness. Normal range of motion.     Cervical back: Normal range of motion and neck supple.  Skin:    General: Skin is warm.     Comments: Maculopapular rash torso/upper; upper extremities.  Neurological:     Mental Status: She is alert and oriented to person, place, and time.  Psychiatric:        Mood and Affect: Affect normal.     LABORATORY DATA:  I have reviewed the data as listed Lab Results  Component Value Date   WBC 3.5 (L) 08/18/2020   HGB 13.1 08/18/2020   HCT 39.9 08/18/2020   MCV 85.4 08/18/2020   PLT 190 08/18/2020   Recent Labs    10/06/19 0934 06/30/20 1538 07/17/20 1241 07/26/20 0814 08/18/20 1106  NA 140   < > 134* 136 136  K 4.6   < > 3.5 3.9 3.8  CL 98   < > 95* 96* 99  CO2 27   < > '27 30 31  ' GLUCOSE 123*   < > 127* 193* 87  BUN 19   < > '20 16 19  ' CREATININE 0.86   < > 0.81 0.91 1.06*  CALCIUM 9.8   < > 8.8* 8.8* 9.3  GFRNONAA 68   < > >60 >60 56*  GFRAA 79  --   --   --   --   PROT 7.0   < > 7.6 6.9 7.9  ALBUMIN 3.8   < > 3.4* 3.1* 3.6  AST 18   < > '27 25 23  ' ALT 13    < > '18 17 15  ' ALKPHOS 61   < > 52 72 52  BILITOT 0.4   < > 0.7 0.4 0.6   < > = values in this interval not displayed.    RADIOGRAPHIC STUDIES: I have personally reviewed the radiological images as listed and agreed with the findings in the report. No results found.   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 on.Osimertinib 80 mg once a day [07/31/2020].  Clinical improvement noted.  Tolerating well without any major side effects.  We will plan imaging in August September.  # Diabetes-blood glucose 83; continue glipizide.[metformin]  # Cough secondary to malignancy-significant improved/resolved.  #Travel-discussed DVT PE precautions with ongoing malignancy.  # DISPOSITION:  # follow up in 4 weeks [[place]/Tuesday]- MD; labs- cbc/cmp/mag-Dr.B  All questions were answered. The patient knows to call the clinic with any problems, questions or concerns.    Cammie Sickle, MD 08/18/2020 12:56 PM

## 2020-08-18 NOTE — Assessment & Plan Note (Addendum)
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 on.Osimertinib 80 mg once a day [07/31/2020].  Clinical improvement noted.  Tolerating well without any major side effects.  We will plan imaging in August September.  # Diabetes-blood glucose 83; continue glipizide.[metformin]  # Cough secondary to malignancy-significant improved/resolved.  #Travel-discussed DVT PE precautions with ongoing malignancy.  # DISPOSITION:  # follow up in 4 weeks [[place]/Tuesday]- MD; labs- cbc/cmp/mag-Dr.B

## 2020-08-21 ENCOUNTER — Other Ambulatory Visit (HOSPITAL_COMMUNITY): Payer: Self-pay

## 2020-08-30 ENCOUNTER — Encounter: Payer: Self-pay | Admitting: *Deleted

## 2020-09-15 ENCOUNTER — Other Ambulatory Visit (HOSPITAL_COMMUNITY): Payer: Self-pay

## 2020-09-19 ENCOUNTER — Other Ambulatory Visit (HOSPITAL_COMMUNITY): Payer: Self-pay

## 2020-09-19 ENCOUNTER — Inpatient Hospital Stay: Payer: Medicare Other

## 2020-09-19 ENCOUNTER — Inpatient Hospital Stay: Payer: Medicare Other | Attending: Internal Medicine | Admitting: Internal Medicine

## 2020-09-19 ENCOUNTER — Encounter: Payer: Self-pay | Admitting: Internal Medicine

## 2020-09-19 ENCOUNTER — Other Ambulatory Visit: Payer: Self-pay

## 2020-09-19 DIAGNOSIS — E119 Type 2 diabetes mellitus without complications: Secondary | ICD-10-CM | POA: Insufficient documentation

## 2020-09-19 DIAGNOSIS — G4700 Insomnia, unspecified: Secondary | ICD-10-CM | POA: Insufficient documentation

## 2020-09-19 DIAGNOSIS — I10 Essential (primary) hypertension: Secondary | ICD-10-CM | POA: Insufficient documentation

## 2020-09-19 DIAGNOSIS — Z7984 Long term (current) use of oral hypoglycemic drugs: Secondary | ICD-10-CM | POA: Insufficient documentation

## 2020-09-19 DIAGNOSIS — C3411 Malignant neoplasm of upper lobe, right bronchus or lung: Secondary | ICD-10-CM | POA: Insufficient documentation

## 2020-09-19 DIAGNOSIS — Z79899 Other long term (current) drug therapy: Secondary | ICD-10-CM | POA: Insufficient documentation

## 2020-09-19 DIAGNOSIS — Z8616 Personal history of COVID-19: Secondary | ICD-10-CM | POA: Insufficient documentation

## 2020-09-19 LAB — COMPREHENSIVE METABOLIC PANEL
ALT: 11 U/L (ref 0–44)
AST: 18 U/L (ref 15–41)
Albumin: 3.2 g/dL — ABNORMAL LOW (ref 3.5–5.0)
Alkaline Phosphatase: 44 U/L (ref 38–126)
Anion gap: 5 (ref 5–15)
BUN: 20 mg/dL (ref 8–23)
CO2: 31 mmol/L (ref 22–32)
Calcium: 9.3 mg/dL (ref 8.9–10.3)
Chloride: 100 mmol/L (ref 98–111)
Creatinine, Ser: 1.09 mg/dL — ABNORMAL HIGH (ref 0.44–1.00)
GFR, Estimated: 54 mL/min — ABNORMAL LOW (ref >60)
Glucose, Bld: 130 mg/dL — ABNORMAL HIGH (ref 70–99)
Potassium: 4.2 mmol/L (ref 3.5–5.1)
Sodium: 136 mmol/L (ref 135–145)
Total Bilirubin: 0.6 mg/dL (ref 0.3–1.2)
Total Protein: 7.5 g/dL (ref 6.5–8.1)

## 2020-09-19 LAB — CBC WITH DIFFERENTIAL/PLATELET
Abs Immature Granulocytes: 0.01 10*3/uL (ref 0.00–0.07)
Basophils Absolute: 0 10*3/uL (ref 0.0–0.1)
Basophils Relative: 1 %
Eosinophils Absolute: 0.2 10*3/uL (ref 0.0–0.5)
Eosinophils Relative: 7 %
HCT: 40.4 % (ref 36.0–46.0)
Hemoglobin: 13 g/dL (ref 12.0–15.0)
Immature Granulocytes: 0 %
Lymphocytes Relative: 23 %
Lymphs Abs: 0.8 10*3/uL (ref 0.7–4.0)
MCH: 27.5 pg (ref 26.0–34.0)
MCHC: 32.2 g/dL (ref 30.0–36.0)
MCV: 85.6 fL (ref 80.0–100.0)
Monocytes Absolute: 0.4 10*3/uL (ref 0.1–1.0)
Monocytes Relative: 12 %
Neutro Abs: 2 10*3/uL (ref 1.7–7.7)
Neutrophils Relative %: 57 %
Platelets: 148 10*3/uL — ABNORMAL LOW (ref 150–400)
RBC: 4.72 MIL/uL (ref 3.87–5.11)
RDW: 15 % (ref 11.5–15.5)
WBC: 3.5 10*3/uL — ABNORMAL LOW (ref 4.0–10.5)
nRBC: 0 % (ref 0.0–0.2)

## 2020-09-19 LAB — MAGNESIUM: Magnesium: 1.7 mg/dL (ref 1.7–2.4)

## 2020-09-19 LAB — BILIRUBIN, DIRECT: Bilirubin, Direct: 0.1 mg/dL (ref 0.0–0.2)

## 2020-09-19 MED ORDER — METFORMIN HCL ER 500 MG PO TB24: 500.0000 mg | ORAL_TABLET | Freq: Every day | ORAL | 1 refills | Status: DC

## 2020-09-19 NOTE — Patient Instructions (Signed)
Melatonin 0.5 to 1mg  as needed at night.

## 2020-09-19 NOTE — Assessment & Plan Note (Addendum)
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 on.Osimertinib 80 mg once a day [07/31/2020].  Clinical improvement noted.  Tolerating well without any major side effects. STABLE.  We will plan imaging in  September.  # Diabetes-blood glucose 143 [weight gain];STOP glipizide.plan metformin 500 ER- /day; substitute celbrex  # Insomnia: recommend melatonin 0.5-1 mg qhs.   #Chronic joint pains-recommend using Celebrex every other day [see below]; Tylenol as needed for pain  #Chronic obesity [s/p lap band] however recent weight gain: Not a common side effect of osimertinib.  Question chronic Celebrex/glipizide [started recently].  Recommend stopping glipizide; start metformin 500 mg ER once a day [intolerance to short acting metformin]; continued physical activity/strength training-to help avoid sarcopenia/help weight loss.   # DISPOSITION:  # follow up in 4 weeks [[place]/Tuesday]- MD; labs- cbc/cmp/mag-Dr.B

## 2020-09-19 NOTE — Progress Notes (Signed)
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  [Dr.Byrnett & Dr.Fleming]; May 2022-MRI brain-NEG.   # MAY 11th, 2022- carbo-alimta x1 cycle; discontinued; June 6th, 2022-osimertinib 80 mg a day  # NGS/MOLECULAR TESTS:POSITIVE for EGFR mutation deletion 19    # PALLIATIVE CARE EVALUATION:  # PAIN MANAGEMENT:    DIAGNOSIS: Lung cancer  STAGE:   IIIC      ;  GOALS:Palliatve  CURRENT/MOST RECENT THERAPY : Osiemertinib      Primary cancer of right upper lobe of lung ([place])  06/30/2020 Initial Diagnosis   Primary cancer of right upper lobe of lung ([place])   06/30/2020 Cancer Staging   Staging form: Lung, AJCC 8th Edition - Clinical: Stage IIIC (cT4, cN3, cM0) - Signed by Cammie Sickle, MD on 06/30/2020  Histopathologic type: Adenocarcinoma, NOS    07/05/2020 -  Chemotherapy    Patient is on Treatment Plan: LUNG CARBOPLATIN / PEMETREXED / PEMBROLIZUMAB Q21D INDUCTION X 4 CYCLES / MAINTENANCE PEMETREXED + PEMBROLIZUMAB          HISTORY OF PRESENTING ILLNESS:  Megan Shields 75 y.o.  female  Stage IIIC/adenocarcinoma the lung EGFR mutated-currently on osimertinib is here for follow-up.  Patient just [place].  The trip was uneventful.  Denies any worsening shortness of breath or cough.  Complains of mild fatigue.  Complains of difficulty sleeping at night/staying asleep.  Complains of weight gain which she attributes to possible oismertinib.   Review of Systems  Constitutional:  Positive for  malaise/fatigue. Negative for chills, diaphoresis, fever and weight loss.  HENT:  Negative for nosebleeds and sore throat.   Eyes:  Negative for double vision.  Respiratory:  Negative for hemoptysis, sputum production and wheezing.   Cardiovascular:  Negative for chest pain, palpitations, orthopnea and leg swelling.  Gastrointestinal:  Negative for abdominal pain, blood in stool, constipation, diarrhea, heartburn, melena, nausea and vomiting.  Genitourinary:  Negative for dysuria, frequency and urgency.  Musculoskeletal:  Negative for back pain and joint pain.  Neurological:  Negative for dizziness, tingling, focal weakness, weakness and headaches.  Endo/Heme/Allergies:  Does not bruise/bleed easily.  Psychiatric/Behavioral:  Negative for depression. The patient is not nervous/anxious and does not have insomnia.     MEDICAL HISTORY:  Past Medical History:  Diagnosis Date  . Anemia    vitamin d deficiency  . Arthritis   . Chronic cough   . Depression   . Diabetes mellitus without complication ([place])   . Dyspnea   . Hypertension   . ILD (interstitial lung disease) ([place])   . Obesity   . Obesity    BMI 49.44  . Pneumonia   . Pneumonia due to 2019 novel coronavirus 2022   community acquired also. covid positive May 2022  . PONV (postoperative nausea and vomiting)   . Primary cancer of right upper lobe of lung ([place]) 06/30/2020  . Rosacea   . Shoulder contusion 11/22/2015    SURGICAL HISTORY: Past Surgical History:  Procedure Laterality Date  . BLEPHAROPLASTY Bilateral 12/19/2017  . CARPAL TUNNEL RELEASE Bilateral 1985  . CHOLECYSTECTOMY    . COLONOSCOPY  WITH PROPOFOL N/A 03/07/2017   Procedure: COLONOSCOPY WITH PROPOFOL;  Surgeon: [name], MD;  Location: [facility] ENDOSCOPY;  Service: Gastroenterology;  Laterality: N/A;  . DILATION AND CURETTAGE OF UTERUS  12/2011   benign endometrial polyp  . HALLUX VALGUS AUSTIN Right 09/02/2014   Procedure: [place];  Surgeon: Samara Deist, DPM;  Location: [facility] ORS;  Service: Podiatry;  Laterality: Right;  . HERNIA REPAIR    . JOINT REPLACEMENT Bilateral    TKR  . LAPAROSCOPIC GASTRIC BANDING  2010   Lap Band  . LAPAROSCOPIC TOTAL HYSTERECTOMY  12/23/2019  . lymph node removed Right   . TOTAL KNEE ARTHROPLASTY Bilateral 2003, 2004  . TOTAL KNEE ARTHROPLASTY Bilateral   . VENTRAL HERNIA REPAIR  2008    SOCIAL HISTORY: Social History   Socioeconomic History  . Marital status: Married    Spouse name: Fritz Pickerel  . Number of children: Not on file  . Years of education: Not on file  . Highest education level: Not on file  Occupational History  . Occupation: Probation officer    Comment: RETIRED  Tobacco Use  . Smoking status: Never  . Smokeless tobacco: Never  Vaping Use  . Vaping Use: Never used  Substance and Sexual Activity  . Alcohol use: No    Alcohol/week: 0.0 standard drinks  . Drug use: No  . Sexual activity: Not Currently  Other Topics Concern  . Not on file  Social History Narrative   Never smoked; no alcohol. Lives in [place] with husband . Home maker.    Daughter lives next door. 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 Difficulty of Paying Living Expenses: Not hard at all  Food Insecurity: No Food Insecurity  . Worried About Charity fundraiser in the Last Year: Never true  . Ran Out of Food in the Last Year: Never true  Transportation Needs: No Transportation Needs  . Lack of Transportation (Medical): No  . Lack of Transportation (Non-Medical): No  Physical Activity: Insufficiently Active  . Days of Exercise per Week: 3 days  . Minutes of Exercise per Session: 30 min  Stress: No Stress Concern Present  . Feeling of Stress : Not at all  Social Connections: Moderately Isolated  . Frequency of Communication with Friends and Family: More than three times a week  . Frequency of Social Gatherings with Friends and  Family: More than three times a week  . Attends Religious Services: Never  . Active Member of Clubs or Organizations: No  . Attends Archivist Meetings: Never  . Marital Status: Married  Human resources officer Violence: Not At Risk  . Fear of Current or Ex-Partner: No  . Emotionally Abused: No  . Physically Abused: No  . Sexually Abused: No    FAMILY HISTORY: Family History  Problem Relation Age of Onset  . Breast cancer Mother 57  . Breast cancer Paternal Aunt        3 pat aunts  . Liver disease Brother        died 93    ALLERGIES:  is allergic to pneumococcal vaccines, wound dressing adhesive, amoxicillin, codeine, and penicillins.  MEDICATIONS:  Current Outpatient Medications  Medication Sig Dispense Refill  . celecoxib (CELEBREX) 200 MG capsule TAKE 1 CAPSULE DAILY 90 capsule 0  . cholecalciferol (VITAMIN D3) 25 MCG (1000 UNIT) tablet Take 1,000 Units by mouth daily.    Marland Kitchen  folic acid (FOLVITE) 1 MG tablet Take 1 tablet (1 mg total) by mouth daily. 90 tablet 0  . FREESTYLE LITE test strip TEST BLOOD SUGAR TWICE A DAY 100 strip 6  . glipiZIDE (GLUCOTROL) 10 MG tablet Take 1 tablet (10 mg total) by mouth daily before breakfast. 90 tablet 1  . glycopyrrolate (ROBINUL) 1 MG tablet     . Lancets (FREESTYLE) lancets TEST BLOOD SUGAR TWICE A DAY 100 each 6  . metFORMIN (GLUCOPHAGE XR) 500 MG 24 hr tablet Take 1 tablet (500 mg total) by mouth daily with breakfast. 90 tablet 1  . olmesartan-hydrochlorothiazide (BENICAR HCT) 40-25 MG tablet Take 1 tablet by mouth daily. 90 tablet 3  . osimertinib mesylate (TAGRISSO) 80 MG tablet Take 1 tablet (80 mg total) by mouth daily. 30 tablet 2  . acetaminophen (TYLENOL) 500 MG tablet Take 1,000 mg by mouth at bedtime as needed for moderate pain. (Patient not taking: No sig reported)    . ondansetron (ZOFRAN) 8 MG tablet One pill every 8 hours as needed for nausea/vomitting. (Patient not taking: Reported on 09/19/2020) 40 tablet 1  .  prochlorperazine (COMPAZINE) 10 MG tablet Take 1 tablet (10 mg total) by mouth every 6 (six) hours as needed for nausea or vomiting. (Patient not taking: Reported on 09/19/2020) 40 tablet 1   No current facility-administered medications for this visit.      Marland Kitchen  PHYSICAL EXAMINATION: ECOG PERFORMANCE STATUS: 0 - Asymptomatic  Vitals:   09/19/20 0954  BP: (!) 154/79  Pulse: 65  Temp: (!) 96.9 F (36.1 C)  SpO2: 99%   Filed Weights   09/19/20 0954  Weight: 296 lb 1.2 oz (134.3 kg)    Physical Exam Constitutional:      Comments: Obese.  She is accompanied by husband.  Ambulating independently.  On room air.  HENT:     Head: Normocephalic and atraumatic.     Mouth/Throat:     Pharynx: No oropharyngeal exudate.  Eyes:     Pupils: Pupils are equal, round, and reactive to light.  Cardiovascular:     Rate and Rhythm: Normal rate and regular rhythm.  Pulmonary:     Effort: No respiratory distress.     Breath sounds: No wheezing.     Comments: Clear to auscultation bilaterally. Abdominal:     General: Bowel sounds are normal. There is no distension.     Palpations: Abdomen is soft. There is no mass.     Tenderness: no abdominal tenderness There is no guarding or rebound.  Musculoskeletal:        General: No tenderness. Normal range of motion.     Cervical back: Normal range of motion and neck supple.  Skin:    General: Skin is warm.     Comments: Maculopapular rash torso/upper; upper extremities.  Neurological:     Mental Status: She is alert and oriented to person, place, and time.  Psychiatric:        Mood and Affect: Affect normal.     LABORATORY DATA:  I have reviewed the data as listed Lab Results  Component Value Date   WBC 3.5 (L) 09/19/2020   HGB 13.0 09/19/2020   HCT 40.4 09/19/2020   MCV 85.6 09/19/2020   PLT 148 (L) 09/19/2020   Recent Labs    10/06/19 0934 06/30/20 1538 07/26/20 0814 08/18/20 1106 09/19/20 0941  NA 140   < > 136 136 136  K 4.6    < > 3.9 3.8 4.2  CL 98   < > 96* 99 100  CO2 27   < > _0 GLUCOSE 123*   < > 193* 87 130*  BUN 19   < > _1 CREATININE 0.86   < > 0.91 1.06* 1.09*  CALCIUM 9.8   < > 8.8* 9.3 9.3  GFRNONAA 68   < > >60 56* 54*  GFRAA 79  --   --   --   --   PROT 7.0   < > 6.9 7.9 7.5  ALBUMIN 3.8   < > 3.1* 3.6 3.2*  AST 18   < > _2 ALT 13   < > _3 ALKPHOS 61   < > 72 52 44  BILITOT 0.4   < > 0.4 0.6 0.6  BILIDIR  --   --   --   --  0.1   < > = values in this interval not displayed.    RADIOGRAPHIC STUDIES: I have personally reviewed the radiological images as listed and agreed with the findings in the report. No results found.   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 on.Osimertinib 80 mg once a day [07/31/2020].  Clinical improvement noted.  Tolerating well without any major side effects. STABLE.  We will plan imaging in  September.  # Diabetes-blood glucose 143 [weight gain];STOP glipizide.plan metformin 500 ER- /day; substitute celbrex  # Insomnia: recommend melatonin 0.5-1 mg qhs.   #Chronic joint pains-recommend using Celebrex every other day [see below]; Tylenol as needed for pain  #Chronic obesity [s/p lap band] however recent weight gain: Not a common side effect of osimertinib.  Question chronic Celebrex/glipizide [started recently].  Recommend stopping glipizide; start metformin 500 mg ER once a day [intolerance to short acting metformin]; continued physical activity/strength training-to help avoid sarcopenia/help weight loss.   # DISPOSITION:  # follow up in 4 weeks [[place]/Tuesday]- MD; labs- cbc/cmp/mag-Dr.B  All questions were answered. The patient knows to call the clinic with any problems, questions or concerns.    Cammie Sickle, MD 09/19/2020 1:27 PM

## 2020-10-06 DIAGNOSIS — E119 Type 2 diabetes mellitus without complications: Secondary | ICD-10-CM | POA: Diagnosis not present

## 2020-10-06 LAB — HM DIABETES EYE EXAM

## 2020-10-09 ENCOUNTER — Encounter: Payer: Self-pay | Admitting: Internal Medicine

## 2020-10-09 ENCOUNTER — Other Ambulatory Visit: Payer: Self-pay

## 2020-10-09 ENCOUNTER — Ambulatory Visit (INDEPENDENT_AMBULATORY_CARE_PROVIDER_SITE_OTHER): Payer: Medicare Other | Admitting: Internal Medicine

## 2020-10-09 VITALS — BP 148/78 | HR 65 | Temp 98.1°F | Ht 64.0 in | Wt 295.0 lb

## 2020-10-09 DIAGNOSIS — E785 Hyperlipidemia, unspecified: Secondary | ICD-10-CM

## 2020-10-09 DIAGNOSIS — E1169 Type 2 diabetes mellitus with other specified complication: Secondary | ICD-10-CM | POA: Diagnosis not present

## 2020-10-09 DIAGNOSIS — C3411 Malignant neoplasm of upper lobe, right bronchus or lung: Secondary | ICD-10-CM

## 2020-10-09 DIAGNOSIS — E118 Type 2 diabetes mellitus with unspecified complications: Secondary | ICD-10-CM | POA: Diagnosis not present

## 2020-10-09 DIAGNOSIS — Z1231 Encounter for screening mammogram for malignant neoplasm of breast: Secondary | ICD-10-CM

## 2020-10-09 DIAGNOSIS — I10 Essential (primary) hypertension: Secondary | ICD-10-CM | POA: Diagnosis not present

## 2020-10-09 DIAGNOSIS — E559 Vitamin D deficiency, unspecified: Secondary | ICD-10-CM | POA: Diagnosis not present

## 2020-10-09 MED ORDER — AMLODIPINE BESYLATE 5 MG PO TABS: 5.0000 mg | ORAL_TABLET | Freq: Every day | ORAL | 1 refills | Status: DC

## 2020-10-09 NOTE — Progress Notes (Addendum)
Date:  10/09/2020   Name:  Megan Shields   DOB:  02-07-1946   MRN:  [id number]   Chief Complaint: Anemia (No Breast exam no pap) Megan Shields is a 75 y.o. female who presents today for her Complete Annual Exam. She feels well. She reports exercising walking X2-3 days a week. She reports she is sleeping well. Breast complaints none.  Mammogram: 08/2019 [facility] DEXA: none Pap smear: discontinued Colonoscopy: 02/2017  Immunization History  Administered Date(s) Administered   PFIZER(Purple Top)SARS-COV-2 Vaccination 03/26/2019, 04/16/2019   Pneumococcal Conjugate-13 03/11/2014   Pneumococcal Polysaccharide-23 09/04/2012   Zoster, Live 03/30/2013    Hypertension This is a chronic problem. The problem is uncontrolled. Pertinent negatives include no chest pain, headaches, palpitations or shortness of breath. Past treatments include angiotensin blockers and diuretics. There are no compliance problems.   Diabetes She presents for her follow-up diabetic visit. She has type 2 diabetes mellitus. Pertinent negatives for hypoglycemia include no dizziness, headaches, nervousness/anxiousness or tremors. Associated symptoms include fatigue. Pertinent negatives for diabetes include no chest pain, no polydipsia and no polyuria. Current diabetic treatment includes oral agent (monotherapy) (glipizide 5 mg; no metformin). She is compliant with treatment all of the time. Her weight is stable. An ACE inhibitor/angiotensin II receptor blocker is being taken.  Hyperlipidemia Pertinent negatives include no chest pain or shortness of breath.  Lung cancer - diagnosed earlier this year after chest infection and persistent cough. Now being treated by Oncology with chemotherapy and now on Osimertinib 80 mg once a day.  Her cough has resolved and she feels fairly well.   Lab Results  Component Value Date   CREATININE 1.09 (H) 09/19/2020   BUN 20 09/19/2020   NA 136 09/19/2020   K 4.2 09/19/2020   CL 100  09/19/2020   CO2 31 09/19/2020   Lab Results  Component Value Date   CHOL 182 10/06/2019   HDL 68 10/06/2019   LDLCALC 98 10/06/2019   TRIG 87 10/06/2019   CHOLHDL 2.7 10/06/2019   Lab Results  Component Value Date   TSH 1.680 10/06/2019   Lab Results  Component Value Date   HGBA1C 7.9 (H) 03/07/2020   Lab Results  Component Value Date   WBC 3.5 (L) 09/19/2020   HGB 13.0 09/19/2020   HCT 40.4 09/19/2020   MCV 85.6 09/19/2020   PLT 148 (L) 09/19/2020   Lab Results  Component Value Date   ALT 11 09/19/2020   AST 18 09/19/2020   ALKPHOS 44 09/19/2020   BILITOT 0.6 09/19/2020     Review of Systems  Constitutional:  Positive for fatigue. Negative for chills and fever.  HENT:  Negative for congestion, hearing loss, tinnitus, trouble swallowing and voice change.   Eyes:  Negative for visual disturbance.  Respiratory:  Negative for cough, chest tightness, shortness of breath and wheezing.   Cardiovascular:  Negative for chest pain, palpitations and leg swelling.  Gastrointestinal:  Negative for abdominal pain, constipation, diarrhea and vomiting.  Endocrine: Negative for polydipsia and polyuria.  Genitourinary:  Negative for dysuria, frequency, genital sores, vaginal bleeding and vaginal discharge.  Musculoskeletal:  Negative for arthralgias, gait problem and joint swelling.  Skin:  Negative for color change and rash.  Neurological:  Negative for dizziness, tremors, light-headedness and headaches.  Hematological:  Negative for adenopathy. Does not bruise/bleed easily.  Psychiatric/Behavioral:  Negative for dysphoric mood and sleep disturbance. The patient is not nervous/anxious.    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 Tears off skin. blisters   Amoxicillin Rash   Codeine     Other reaction(s): drowsiness   Penicillins Rash    Past Surgical History:  Procedure Laterality Date   BLEPHAROPLASTY Bilateral 12/19/2017   CARPAL TUNNEL RELEASE Bilateral 1985   CHOLECYSTECTOMY     COLONOSCOPY WITH PROPOFOL N/A 03/07/2017   Procedure: COLONOSCOPY WITH PROPOFOL;  Surgeon: [name], MD;  Location[facility] ENDOSCOPY;  Service: Gastroenterology;  Laterality: N/A;   DILATION AND CURETTAGE OF UTERUS  12/2011   benign endometrial polyp   HALLUX VALGUS AUSTIN Right 09/02/2014   Procedure: [place];  Surgeon: Samara Deist, DPM;  Location: [facility] ORS;  Service: Podiatry;  Laterality: Right;   HERNIA REPAIR     JOINT REPLACEMENT Bilateral    TKR   LAPAROSCOPIC GASTRIC BANDING  2010   Lap Band   LAPAROSCOPIC TOTAL HYSTERECTOMY  12/23/2019   lymph node removed Right    TOTAL KNEE ARTHROPLASTY Bilateral 2003, 2004   TOTAL KNEE ARTHROPLASTY Bilateral    VENTRAL HERNIA REPAIR  2008    Social History   Tobacco Use   Smoking status: Never   Smokeless tobacco: Never  Vaping Use   Vaping Use: Never used  Substance Use Topics   Alcohol use: No    Alcohol/week: 0.0 standard drinks   Drug use: No     Medication list has been reviewed and updated.  Current Meds  Medication Sig   acetaminophen (TYLENOL) 500 MG tablet Take 1,000 mg by mouth at bedtime as needed for  moderate pain.   amLODipine (NORVASC) 5 MG tablet Take 1 tablet (5 mg total) by mouth daily.   celecoxib (CELEBREX) 200 MG capsule TAKE 1 CAPSULE DAILY   cholecalciferol (VITAMIN D3) 25 MCG (1000 UNIT) tablet Take 1,000 Units by mouth daily.   folic acid (FOLVITE) 1 MG tablet Take 1 tablet (1 mg total) by mouth daily.   FREESTYLE LITE test strip TEST BLOOD SUGAR TWICE A DAY   glipiZIDE (GLUCOTROL) 10 MG tablet Take 1 tablet (10 mg total) by mouth daily before breakfast. (Patient taking differently: Take 5 mg by mouth daily before breakfast.)   glycopyrrolate (ROBINUL) 1 MG tablet    Lancets (FREESTYLE) lancets TEST BLOOD SUGAR TWICE A DAY   olmesartan-hydrochlorothiazide (BENICAR HCT) 40-25 MG tablet Take 1 tablet by mouth daily.   ondansetron (ZOFRAN) 8 MG tablet One pill every 8 hours as needed for nausea/vomitting. (Patient taking differently: as needed. One pill every 8 hours as needed for nausea/vomitting.)   osimertinib mesylate (TAGRISSO) 80 MG tablet Take 1 tablet (80 mg total) by mouth daily.   prochlorperazine (COMPAZINE) 10 MG tablet Take 1 tablet (10 mg total) by mouth every 6 (  six) hours as needed for nausea or vomiting.    PHQ 2/9 Scores 10/09/2020 04/26/2020 04/14/2020 03/07/2020  PHQ - 2 Score 0 0 0 0  PHQ- 9 Score 4 8 4  0    GAD 7 : Generalized Anxiety Score 10/09/2020 04/26/2020 04/14/2020 03/07/2020  Nervous, Anxious, on Edge 0 0 0 0  Control/stop worrying 0 0 0 0  Worry too much - different things 0 0 0 0  Trouble relaxing 0 0 0 0  Restless 0 0 0 0  Easily annoyed or irritable 0 0 0 0  Afraid - awful might happen 0 0 0 0  Total GAD 7 Score 0 0 0 0  Anxiety Difficulty - - - Not difficult at all    BP Readings from Last 3 Encounters:  10/09/20 (!) 148/78  09/19/20 (!) 154/79  08/18/20 (!) 144/86    Physical Exam Vitals and nursing note reviewed.  Constitutional:      General: She is not in acute distress.    Appearance: She is well-developed.  HENT:     Head:  Normocephalic and atraumatic.     Right Ear: Tympanic membrane and ear canal normal.     Left Ear: Tympanic membrane and ear canal normal.     Nose:     Right Sinus: No maxillary sinus tenderness.     Left Sinus: No maxillary sinus tenderness.  Eyes:     General: No scleral icterus.       Right eye: No discharge.        Left eye: No discharge.     Conjunctiva/sclera: Conjunctivae normal.  Neck:     Thyroid: No thyromegaly.     Vascular: No carotid bruit.  Cardiovascular:     Rate and Rhythm: Normal rate and regular rhythm.     Pulses: Normal pulses.     Heart sounds: Normal heart sounds.  Pulmonary:     Effort: Pulmonary effort is normal. No respiratory distress.     Breath sounds: No wheezing.  Chest:     Comments: Breast exam declined Abdominal:     General: Bowel sounds are normal.     Palpations: Abdomen is soft.     Tenderness: There is no abdominal tenderness.  Musculoskeletal:     Cervical back: Normal range of motion. No erythema.     Right lower leg: No edema.     Left lower leg: No edema.  Lymphadenopathy:     Cervical: No cervical adenopathy.  Skin:    General: Skin is warm and dry.     Findings: No rash.  Neurological:     Mental Status: She is alert and oriented to person, place, and time.     Cranial Nerves: No cranial nerve deficit.     Sensory: No sensory deficit.     Deep Tendon Reflexes: Reflexes are normal and symmetric.  Psychiatric:        Attention and Perception: Attention normal.        Mood and Affect: Mood normal.    Wt Readings from Last 3 Encounters:  10/09/20 295 lb (133.8 kg)  09/19/20 296 lb 1.2 oz (134.3 kg)  08/18/20 284 lb 6.3 oz (129 kg)    BP (!) 148/78   Pulse 65   Temp 98.1 F (36.7 C) (Oral)   Ht 5\' 4"  (1.626 m)   Wt 295 lb (133.8 kg)   SpO2 96%   BMI 50.64 kg/m   Assessment and Plan: 1. Essential (primary) hypertension Not controlled  on olmesartan 40/hctz 25. Will add amlodipine 5 mg. Pt to monitor BP at  home. Follow up in 4 months - sooner if BP remains elevated - amLODipine (NORVASC) 5 MG tablet; Take 1 tablet (5 mg total) by mouth daily.  Dispense: 90 tablet; Refill: 1  2. Type II diabetes mellitus with complication (HCC) Clinically stable by exam and report without s/s of hypoglycemia. DM complicated by hypertension and dyslipidemia. Tolerating medications well without side effects or other concerns. Only taking glipizide 5 mg daily; has metformin but this caused diarrhea in the past - Hemoglobin A1c  3. Hyperlipidemia associated with type 2 diabetes mellitus ([place]) Pt declines statin therapy - Lipid panel  4. Primary cancer of right upper lobe of lung ([place]) Doing well s/p chemotx now on Kinase inhibitor treatment and doing very well  5. Vitamin D deficiency Supplemented Last value was in normal range  6. Encounter for screening mammogram for breast cancer To be scheduled at [facility] - MM 3D [place]   Partially dictated using Editor, commissioning. Any errors are unintentional.  Halina Maidens, MD [facility]  10/09/2020

## 2020-10-10 ENCOUNTER — Other Ambulatory Visit: Payer: Self-pay | Admitting: Internal Medicine

## 2020-10-10 DIAGNOSIS — M170 Bilateral primary osteoarthritis of knee: Secondary | ICD-10-CM

## 2020-10-10 LAB — HEMOGLOBIN A1C
Est. average glucose Bld gHb Est-mCnc: 157 mg/dL
Hgb A1c MFr Bld: 7.1 % — ABNORMAL HIGH (ref 4.8–5.6)

## 2020-10-10 LAB — LIPID PANEL
Chol/HDL Ratio: 2.5 ratio (ref 0.0–4.4)
Cholesterol, Total: 194 mg/dL (ref 100–199)
HDL: 78 mg/dL (ref >39)
LDL Chol Calc (NIH): 100 mg/dL — ABNORMAL HIGH (ref 0–99)
Triglycerides: 93 mg/dL (ref 0–149)
VLDL Cholesterol Cal: 16 mg/dL (ref 5–40)

## 2020-10-11 ENCOUNTER — Encounter: Payer: Self-pay | Admitting: Internal Medicine

## 2020-10-12 ENCOUNTER — Other Ambulatory Visit (HOSPITAL_COMMUNITY): Payer: Self-pay

## 2020-10-12 ENCOUNTER — Other Ambulatory Visit: Payer: Self-pay | Admitting: Internal Medicine

## 2020-10-12 DIAGNOSIS — C3411 Malignant neoplasm of upper lobe, right bronchus or lung: Secondary | ICD-10-CM

## 2020-10-12 MED ORDER — OSIMERTINIB MESYLATE 80 MG PO TABS
80.0000 mg | ORAL_TABLET | Freq: Every day | ORAL | 2 refills | Status: DC
  Filled 2020-10-19: qty 30, 30d supply, fill #0

## 2020-10-17 ENCOUNTER — Other Ambulatory Visit: Payer: Self-pay

## 2020-10-17 ENCOUNTER — Inpatient Hospital Stay: Payer: Medicare Other | Attending: Internal Medicine

## 2020-10-17 ENCOUNTER — Encounter: Payer: Self-pay | Admitting: Internal Medicine

## 2020-10-17 ENCOUNTER — Inpatient Hospital Stay (HOSPITAL_BASED_OUTPATIENT_CLINIC_OR_DEPARTMENT_OTHER): Payer: Medicare Other | Admitting: Internal Medicine

## 2020-10-17 VITALS — BP 144/57 | HR 66 | Temp 97.3°F | Resp 18 | Wt 291.3 lb

## 2020-10-17 DIAGNOSIS — Z7984 Long term (current) use of oral hypoglycemic drugs: Secondary | ICD-10-CM | POA: Insufficient documentation

## 2020-10-17 DIAGNOSIS — C3411 Malignant neoplasm of upper lobe, right bronchus or lung: Secondary | ICD-10-CM

## 2020-10-17 DIAGNOSIS — Z79899 Other long term (current) drug therapy: Secondary | ICD-10-CM | POA: Diagnosis not present

## 2020-10-17 DIAGNOSIS — Z8616 Personal history of COVID-19: Secondary | ICD-10-CM | POA: Diagnosis not present

## 2020-10-17 DIAGNOSIS — Z9221 Personal history of antineoplastic chemotherapy: Secondary | ICD-10-CM | POA: Diagnosis not present

## 2020-10-17 DIAGNOSIS — I10 Essential (primary) hypertension: Secondary | ICD-10-CM | POA: Diagnosis not present

## 2020-10-17 DIAGNOSIS — E119 Type 2 diabetes mellitus without complications: Secondary | ICD-10-CM | POA: Diagnosis not present

## 2020-10-17 LAB — COMPREHENSIVE METABOLIC PANEL
ALT: 11 U/L (ref 0–44)
AST: 19 U/L (ref 15–41)
Albumin: 3.5 g/dL (ref 3.5–5.0)
Alkaline Phosphatase: 51 U/L (ref 38–126)
Anion gap: 8 (ref 5–15)
BUN: 26 mg/dL — ABNORMAL HIGH (ref 8–23)
CO2: 31 mmol/L (ref 22–32)
Calcium: 9.4 mg/dL (ref 8.9–10.3)
Chloride: 96 mmol/L — ABNORMAL LOW (ref 98–111)
Creatinine, Ser: 1.06 mg/dL — ABNORMAL HIGH (ref 0.44–1.00)
GFR, Estimated: 56 mL/min — ABNORMAL LOW (ref >60)
Glucose, Bld: 61 mg/dL — ABNORMAL LOW (ref 70–99)
Potassium: 3.7 mmol/L (ref 3.5–5.1)
Sodium: 135 mmol/L (ref 135–145)
Total Bilirubin: 0.7 mg/dL (ref 0.3–1.2)
Total Protein: 8.2 g/dL — ABNORMAL HIGH (ref 6.5–8.1)

## 2020-10-17 LAB — CBC WITH DIFFERENTIAL/PLATELET
Abs Immature Granulocytes: 0.01 10*3/uL (ref 0.00–0.07)
Basophils Absolute: 0 10*3/uL (ref 0.0–0.1)
Basophils Relative: 1 %
Eosinophils Absolute: 0.2 10*3/uL (ref 0.0–0.5)
Eosinophils Relative: 4 %
HCT: 42.9 % (ref 36.0–46.0)
Hemoglobin: 14 g/dL (ref 12.0–15.0)
Immature Granulocytes: 0 %
Lymphocytes Relative: 24 %
Lymphs Abs: 0.9 10*3/uL (ref 0.7–4.0)
MCH: 27.5 pg (ref 26.0–34.0)
MCHC: 32.6 g/dL (ref 30.0–36.0)
MCV: 84.1 fL (ref 80.0–100.0)
Monocytes Absolute: 0.5 10*3/uL (ref 0.1–1.0)
Monocytes Relative: 14 %
Neutro Abs: 2.1 10*3/uL (ref 1.7–7.7)
Neutrophils Relative %: 57 %
Platelets: 160 10*3/uL (ref 150–400)
RBC: 5.1 MIL/uL (ref 3.87–5.11)
RDW: 14.1 % (ref 11.5–15.5)
WBC: 3.7 10*3/uL — ABNORMAL LOW (ref 4.0–10.5)
nRBC: 0 % (ref 0.0–0.2)

## 2020-10-17 LAB — MAGNESIUM: Magnesium: 1.7 mg/dL (ref 1.7–2.4)

## 2020-10-17 MED ORDER — MECLIZINE HCL 25 MG PO TABS: ORAL_TABLET | ORAL | 0 refills | Status: DC

## 2020-10-17 NOTE — Progress Notes (Signed)
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  [Dr.Byrnett & Dr.Fleming]; May 2022-MRI brain-NEG.   # MAY 11th, 2022- carbo-alimta x1 cycle; discontinued; June 6th, 2022-osimertinib 80 mg a day  # NGS/MOLECULAR TESTS:POSITIVE for EGFR mutation deletion 19    # PALLIATIVE CARE EVALUATION:  # PAIN MANAGEMENT:    DIAGNOSIS: Lung cancer  STAGE:   IIIC      ;  GOALS:Palliatve  CURRENT/MOST RECENT THERAPY : Osiemertinib      Primary cancer of right upper lobe of lung ([place])  06/30/2020 Initial Diagnosis   Primary cancer of right upper lobe of lung ([place])   06/30/2020 Cancer Staging   Staging form: Lung, AJCC 8th Edition - Clinical: Stage IIIC (cT4, cN3, cM0) - Signed by Cammie Sickle, MD on 06/30/2020 Histopathologic type: Adenocarcinoma, NOS   07/05/2020 -  Chemotherapy    Patient is on Treatment Plan: LUNG CARBOPLATIN / PEMETREXED / PEMBROLIZUMAB Q21D INDUCTION X 4 CYCLES / MAINTENANCE PEMETREXED + PEMBROLIZUMAB          HISTORY OF PRESENTING ILLNESS:  Megan Shields 75 y.o.  female  Stage IIIC/adenocarcinoma the lung EGFR mutated-currently on osimertinib is here for follow-up.  Patient denies any shortness of breath or cough.  No fevers or chills.  Mild fatigue.  States her blood glucose is improved-cut down her glipizide to half. [Less appetite]; did not start metformin.   Review of Systems   Constitutional:  Positive for malaise/fatigue. Negative for chills, diaphoresis, fever and weight loss.  HENT:  Negative for nosebleeds and sore throat.   Eyes:  Negative for double vision.  Respiratory:  Negative for hemoptysis, sputum production and wheezing.   Cardiovascular:  Negative for chest pain, palpitations, orthopnea and leg swelling.  Gastrointestinal:  Negative for abdominal pain, blood in stool, constipation, diarrhea, heartburn, melena, nausea and vomiting.  Genitourinary:  Negative for dysuria, frequency and urgency.  Musculoskeletal:  Negative for back pain and joint pain.  Neurological:  Negative for dizziness, tingling, focal weakness, weakness and headaches.  Endo/Heme/Allergies:  Does not bruise/bleed easily.  Psychiatric/Behavioral:  Negative for depression. The patient is not nervous/anxious and does not have insomnia.     MEDICAL HISTORY:  Past Medical History:  Diagnosis Date   Anemia    vitamin d deficiency   Arthritis    Chronic cough    Depression    Diabetes mellitus without complication ([place])    Dyspnea    Hypertension    ILD (interstitial lung disease) ([place])    Obesity    Obesity    BMI 49.44   Pneumonia    Pneumonia due to 2019 novel coronavirus 2022   community acquired also. covid positive May 2022   PONV (postoperative nausea and vomiting)    Primary cancer of right upper lobe of lung ([place]) 06/30/2020   Rosacea    Shoulder contusion 11/22/2015    SURGICAL HISTORY: Past Surgical History:  Procedure Laterality Date   BLEPHAROPLASTY Bilateral 12/19/2017   CARPAL TUNNEL RELEASE Bilateral 1985   CHOLECYSTECTOMY     COLONOSCOPY  WITH PROPOFOL N/A 03/07/2017   Procedure: COLONOSCOPY WITH PROPOFOL;  Surgeon: [name], MD;  Location: [facility] ENDOSCOPY;  Service: Gastroenterology;  Laterality: N/A;   DILATION AND CURETTAGE OF UTERUS  12/2011   benign endometrial polyp   HALLUX VALGUS AUSTIN Right 09/02/2014   Procedure: [place];   Surgeon: Samara Deist, DPM;  Location: [facility] ORS;  Service: Podiatry;  Laterality: Right;   HERNIA REPAIR     JOINT REPLACEMENT Bilateral    TKR   LAPAROSCOPIC GASTRIC BANDING  2010   Lap Band   LAPAROSCOPIC TOTAL HYSTERECTOMY  12/23/2019   lymph node removed Right    TOTAL KNEE ARTHROPLASTY Bilateral 2003, 2004   TOTAL KNEE ARTHROPLASTY Bilateral    VENTRAL HERNIA REPAIR  2008    SOCIAL HISTORY: Social History   Socioeconomic History   Marital status: Married    Spouse name: Fritz Pickerel   Number of children: Not on file   Years of education: Not on file   Highest education level: Not on file  Occupational History   Occupation: Probation officer    Comment: RETIRED  Tobacco Use   Smoking status: Never   Smokeless tobacco: Never  Vaping Use   Vaping Use: Never used  Substance and Sexual Activity   Alcohol use: No    Alcohol/week: 0.0 standard drinks   Drug use: No   Sexual activity: Not Currently  Other Topics Concern   Not on file  Social History Narrative   Never smoked; no alcohol. Lives in [place] with husband . Home maker.    Daughter lives next door. 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   Social Determinants of Health   Financial Resource Strain: Low Risk    Difficulty of Paying Living Expenses: Not hard at all  Food Insecurity: No Food Insecurity   Worried About Charity fundraiser in the Last Year: Never true   Arboriculturist in the Last Year: Never true  Transportation Needs: No Transportation Needs   Lack of Transportation (Medical): No   Lack of Transportation (Non-Medical): No  Physical Activity: Insufficiently Active   Days of Exercise per Week: 3 days   Minutes of Exercise per Session: 30 min  Stress: No Stress Concern Present   Feeling of Stress : Not at all  Social Connections: Moderately Isolated   Frequency of Communication with Friends and Family: More than three times a week   Frequency of Social Gatherings with Friends and Family: More  than three times a week   Attends Religious Services: Never   Marine scientist or Organizations: No   Attends Music therapist: Never   Marital Status: Married  Human resources officer Violence: Not At Risk   Fear of Current or Ex-Partner: No   Emotionally Abused: No   Physically Abused: No   Sexually Abused: No    FAMILY HISTORY: Family History  Problem Relation Age of Onset   Breast cancer Mother 75   Breast cancer Paternal Aunt        3 pat aunts   Liver disease Brother        died 10    ALLERGIES:  is allergic to pneumococcal vaccines, wound dressing adhesive, amoxicillin, codeine, and penicillins.  MEDICATIONS:  Current Outpatient Medications  Medication Sig Dispense Refill   acetaminophen (TYLENOL) 500 MG tablet Take 1,000 mg by mouth at bedtime as needed for moderate pain.     amLODipine (NORVASC) 5 MG tablet Take 1 tablet (5  mg total) by mouth daily. 90 tablet 1   celecoxib (CELEBREX) 200 MG capsule TAKE 1 CAPSULE DAILY 90 capsule 3   cholecalciferol (VITAMIN D3) 25 MCG (1000 UNIT) tablet Take 1,000 Units by mouth daily.     folic acid (FOLVITE) 1 MG tablet Take 1 tablet (1 mg total) by mouth daily. 90 tablet 0   FREESTYLE LITE test strip TEST BLOOD SUGAR TWICE A DAY 100 strip 6   glipiZIDE (GLUCOTROL) 10 MG tablet Take 1 tablet (10 mg total) by mouth daily before breakfast. (Patient taking differently: Take 5 mg by mouth daily before breakfast.) 90 tablet 1   glycopyrrolate (ROBINUL) 1 MG tablet      Lancets (FREESTYLE) lancets TEST BLOOD SUGAR TWICE A DAY 100 each 6   meclizine (ANTIVERT) 25 MG tablet 1 pill at night as needed; if dizziness not improved can take one pill every 8 hours as needed/tolerated. 30 tablet 0   olmesartan-hydrochlorothiazide (BENICAR HCT) 40-25 MG tablet Take 1 tablet by mouth daily. 90 tablet 3   ondansetron (ZOFRAN) 8 MG tablet One pill every 8 hours as needed for nausea/vomitting. (Patient taking differently: as needed. One pill  every 8 hours as needed for nausea/vomitting.) 40 tablet 1   osimertinib mesylate (TAGRISSO) 80 MG tablet Take 1 tablet (80 mg total) by mouth daily. 30 tablet 2   prochlorperazine (COMPAZINE) 10 MG tablet Take 1 tablet (10 mg total) by mouth every 6 (six) hours as needed for nausea or vomiting. 40 tablet 1   No current facility-administered medications for this visit.      Marland Kitchen  PHYSICAL EXAMINATION: ECOG PERFORMANCE STATUS: 0 - Asymptomatic  Vitals:   10/17/20 1005  BP: (!) 144/57  Pulse: 66  Resp: 18  Temp: (!) 97.3 F (36.3 C)  SpO2: 98%   Filed Weights   10/17/20 1005  Weight: 291 lb 5.4 oz (132.2 kg)    Physical Exam Constitutional:      Comments: Obese.  She is accompanied by husband.  Ambulating independently.  On room air.  HENT:     Head: Normocephalic and atraumatic.     Mouth/Throat:     Pharynx: No oropharyngeal exudate.  Eyes:     Pupils: Pupils are equal, round, and reactive to light.  Cardiovascular:     Rate and Rhythm: Normal rate and regular rhythm.  Pulmonary:     Effort: No respiratory distress.     Breath sounds: No wheezing.     Comments: Clear to auscultation bilaterally. Abdominal:     General: Bowel sounds are normal. There is no distension.     Palpations: Abdomen is soft. There is no mass.     Tenderness: There is no abdominal tenderness. There is no guarding or rebound.  Musculoskeletal:        General: No tenderness. Normal range of motion.     Cervical back: Normal range of motion and neck supple.  Skin:    General: Skin is warm.     Comments: Maculopapular rash torso/upper; upper extremities.  Neurological:     Mental Status: She is alert and oriented to person, place, and time.  Psychiatric:        Mood and Affect: Affect normal.     LABORATORY DATA:  I have reviewed the data as listed Lab Results  Component Value Date   WBC 3.7 (L) 10/17/2020   HGB 14.0 10/17/2020   HCT 42.9 10/17/2020   MCV 84.1 10/17/2020   PLT 160  10/17/2020  Recent Labs    07/26/20 0814 08/18/20 1106 09/19/20 0941  NA 136 136 136  K 3.9 3.8 4.2  CL 96* 99 100  CO2 '30 31 31  ' GLUCOSE 193* 87 130*  BUN '16 19 20  ' CREATININE 0.91 1.06* 1.09*  CALCIUM 8.8* 9.3 9.3  GFRNONAA >60 56* 54*  PROT 6.9 7.9 7.5  ALBUMIN 3.1* 3.6 3.2*  AST '25 23 18  ' ALT '17 15 11  ' ALKPHOS 72 52 44  BILITOT 0.4 0.6 0.6  BILIDIR  --   --  0.1    RADIOGRAPHIC STUDIES: I have personally reviewed the radiological images as listed and agreed with the findings in the report. No results found.   ASSESSMENT & PLAN:   No problem-specific Assessment & Plan notes found for this encounter.  All questions were answered. The patient knows to call the clinic with any problems, questions or concerns.    Cammie Sickle, MD 10/17/2020 10:32 AM

## 2020-10-17 NOTE — Assessment & Plan Note (Signed)
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 on.Osimertinib 80 mg once a day [07/31/2020].  Clinical improvement noted.  Tolerating well without any major side effects. STABLE.  We will plan imaging in  September.  # Diabetes-blood glucose 130 [lost 5 pounds]; 1/2 glipizide [HOLD metformin 500 ER- /day]; substitute celbrex  # Insomnia: recommend melatonin 0.5-1 mg qhs.   #Chronic joint pains-recommend using Celebrex every other day [see below]; Tylenol as needed for pain.   # Vertigo [s/p PT in past]- called in meclizein 25 mh  As needed.   # DISPOSITION:  # follow up in 4 weeks [[place]/Tuesday]- MD; labs- cbc/cmp/mag; CT Chest prior-Dr.B

## 2020-10-19 ENCOUNTER — Other Ambulatory Visit (HOSPITAL_COMMUNITY): Payer: Self-pay

## 2020-11-02 ENCOUNTER — Ambulatory Visit: Payer: Medicare Other

## 2020-11-08 ENCOUNTER — Other Ambulatory Visit: Payer: Self-pay

## 2020-11-08 ENCOUNTER — Ambulatory Visit
Admission: RE | Admit: 2020-11-08 | Discharge: 2020-11-08 | Disposition: A | Discharge status: Home / Self Care | Payer: Medicare Other | Source: Ambulatory Visit | Attending: Internal Medicine | Admitting: Internal Medicine

## 2020-11-08 DIAGNOSIS — R590 Localized enlarged lymph nodes: Secondary | ICD-10-CM | POA: Diagnosis not present

## 2020-11-08 DIAGNOSIS — R911 Solitary pulmonary nodule: Secondary | ICD-10-CM | POA: Diagnosis not present

## 2020-11-08 DIAGNOSIS — C3411 Malignant neoplasm of upper lobe, right bronchus or lung: Secondary | ICD-10-CM | POA: Insufficient documentation

## 2020-11-08 DIAGNOSIS — I700 Atherosclerosis of aorta: Secondary | ICD-10-CM | POA: Diagnosis not present

## 2020-11-08 DIAGNOSIS — C7800 Secondary malignant neoplasm of unspecified lung: Secondary | ICD-10-CM | POA: Diagnosis not present

## 2020-11-08 MED ORDER — IOHEXOL 300 MG/ML  SOLN
75.0000 mL | Freq: Once | INTRAMUSCULAR | Status: AC | PRN
  Administered 2020-11-08: 75 mL via INTRAVENOUS

## 2020-11-09 ENCOUNTER — Other Ambulatory Visit: Payer: Self-pay | Admitting: *Deleted

## 2020-11-09 DIAGNOSIS — C3411 Malignant neoplasm of upper lobe, right bronchus or lung: Secondary | ICD-10-CM

## 2020-11-14 ENCOUNTER — Other Ambulatory Visit: Payer: Self-pay | Admitting: Internal Medicine

## 2020-11-14 ENCOUNTER — Other Ambulatory Visit: Payer: Self-pay

## 2020-11-14 ENCOUNTER — Inpatient Hospital Stay (HOSPITAL_BASED_OUTPATIENT_CLINIC_OR_DEPARTMENT_OTHER): Payer: Medicare Other | Admitting: Internal Medicine

## 2020-11-14 ENCOUNTER — Other Ambulatory Visit (HOSPITAL_COMMUNITY): Payer: Self-pay

## 2020-11-14 ENCOUNTER — Encounter: Payer: Self-pay | Admitting: Internal Medicine

## 2020-11-14 ENCOUNTER — Inpatient Hospital Stay: Payer: Medicare Other | Attending: Internal Medicine

## 2020-11-14 VITALS — BP 149/84 | HR 59 | Temp 97.4°F | Resp 18 | Wt 298.1 lb

## 2020-11-14 DIAGNOSIS — Z9221 Personal history of antineoplastic chemotherapy: Secondary | ICD-10-CM | POA: Diagnosis not present

## 2020-11-14 DIAGNOSIS — Z79899 Other long term (current) drug therapy: Secondary | ICD-10-CM | POA: Insufficient documentation

## 2020-11-14 DIAGNOSIS — Z7984 Long term (current) use of oral hypoglycemic drugs: Secondary | ICD-10-CM | POA: Diagnosis not present

## 2020-11-14 DIAGNOSIS — G4700 Insomnia, unspecified: Secondary | ICD-10-CM | POA: Insufficient documentation

## 2020-11-14 DIAGNOSIS — E119 Type 2 diabetes mellitus without complications: Secondary | ICD-10-CM | POA: Insufficient documentation

## 2020-11-14 DIAGNOSIS — E041 Nontoxic single thyroid nodule: Secondary | ICD-10-CM

## 2020-11-14 DIAGNOSIS — C3411 Malignant neoplasm of upper lobe, right bronchus or lung: Secondary | ICD-10-CM

## 2020-11-14 DIAGNOSIS — R42 Dizziness and giddiness: Secondary | ICD-10-CM | POA: Insufficient documentation

## 2020-11-14 DIAGNOSIS — Z803 Family history of malignant neoplasm of breast: Secondary | ICD-10-CM | POA: Insufficient documentation

## 2020-11-14 LAB — CBC WITH DIFFERENTIAL/PLATELET
Abs Immature Granulocytes: 0.01 10*3/uL (ref 0.00–0.07)
Basophils Absolute: 0 10*3/uL (ref 0.0–0.1)
Basophils Relative: 1 %
Eosinophils Absolute: 0.2 10*3/uL (ref 0.0–0.5)
Eosinophils Relative: 5 %
HCT: 41.9 % (ref 36.0–46.0)
Hemoglobin: 13.5 g/dL (ref 12.0–15.0)
Immature Granulocytes: 0 %
Lymphocytes Relative: 26 %
Lymphs Abs: 1 10*3/uL (ref 0.7–4.0)
MCH: 27.5 pg (ref 26.0–34.0)
MCHC: 32.2 g/dL (ref 30.0–36.0)
MCV: 85.3 fL (ref 80.0–100.0)
Monocytes Absolute: 0.5 10*3/uL (ref 0.1–1.0)
Monocytes Relative: 13 %
Neutro Abs: 2.2 10*3/uL (ref 1.7–7.7)
Neutrophils Relative %: 55 %
Platelets: 160 10*3/uL (ref 150–400)
RBC: 4.91 MIL/uL (ref 3.87–5.11)
RDW: 13.8 % (ref 11.5–15.5)
WBC: 4 10*3/uL (ref 4.0–10.5)
nRBC: 0 % (ref 0.0–0.2)

## 2020-11-14 LAB — COMPREHENSIVE METABOLIC PANEL
ALT: 10 U/L (ref 0–44)
AST: 20 U/L (ref 15–41)
Albumin: 3.7 g/dL (ref 3.5–5.0)
Alkaline Phosphatase: 50 U/L (ref 38–126)
Anion gap: 4 — ABNORMAL LOW (ref 5–15)
BUN: 19 mg/dL (ref 8–23)
CO2: 32 mmol/L (ref 22–32)
Calcium: 9.1 mg/dL (ref 8.9–10.3)
Chloride: 99 mmol/L (ref 98–111)
Creatinine, Ser: 1.14 mg/dL — ABNORMAL HIGH (ref 0.44–1.00)
GFR, Estimated: 51 mL/min — ABNORMAL LOW (ref >60)
Glucose, Bld: 66 mg/dL — ABNORMAL LOW (ref 70–99)
Potassium: 3.6 mmol/L (ref 3.5–5.1)
Sodium: 135 mmol/L (ref 135–145)
Total Bilirubin: 0.7 mg/dL (ref 0.3–1.2)
Total Protein: 7.8 g/dL (ref 6.5–8.1)

## 2020-11-14 LAB — MAGNESIUM: Magnesium: 1.6 mg/dL — ABNORMAL LOW (ref 1.7–2.4)

## 2020-11-14 MED ORDER — OSIMERTINIB MESYLATE 80 MG PO TABS
80.0000 mg | ORAL_TABLET | Freq: Every day | ORAL | 2 refills | Status: DC
  Filled 2020-11-14: qty 30, 30d supply, fill #0

## 2020-11-14 MED ORDER — OSIMERTINIB MESYLATE 80 MG PO TABS
80.0000 mg | ORAL_TABLET | Freq: Every day | ORAL | 2 refills | Status: DC
Start: 2020-11-14 — End: 2021-03-02

## 2020-11-14 MED ORDER — GLIPIZIDE ER 2.5 MG PO TB24
2.5000 mg | ORAL_TABLET | Freq: Every day | ORAL | 1 refills | Status: DC
Start: 2020-11-14 — End: 2021-04-26

## 2020-11-14 NOTE — Progress Notes (Signed)
A note from [facility] for [facility]; the pharmacy will no longer accept [facility]; t But, they were given the option to use another pharmacy in [place], [place] and they will continue to pay for it.

## 2020-11-14 NOTE — Assessment & Plan Note (Addendum)
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 on.Osimertinib 80 mg once a day [07/31/2020]. SEP 14th, 2022-CT scan shows overall substantial interval improvement since previous imaging with significant interval decrease in the size of the RIGHT upper lobe mass and mediastinal adenopathy; improvement of the carcinomatosis. No evidence of new pulmonary nodule or mass.  #Incidental enlarged thyroid gland, similar to prior imaging. Enlarged and heterogeneous.  Recommend thyroid ultrasound; ordered.  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  Asymptomatic monitor for now  # Insomnia: Continue melatonin 0.5-1 mg qhs.   #Chronic joint pains-recommend using Celebrex every other day [see below]; Tylenol as needed for pain.  Stable  #Diabetes /type II -PBF- BG- 66; decrease the glipizide to 2.5 mg XL- [facility].   # Vertigo [s/p PT in past]- called in meclizein 25 mh  As needed.  Stable  # DISPOSITION:  # US thyroid in 1 week # follow up in 4 weeks- MD; labs- cbc/cmp/mag; -Dr.B  # I reviewed the blood work- with the patient in detail; also reviewed the imaging independently [as summarized above]; and with the patient in detail.

## 2020-11-14 NOTE — Progress Notes (Signed)
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  [Dr.Byrnett & Dr.Fleming]; May 2022-MRI brain-NEG.   # MAY 11th, 2022- carbo-alimta x1 cycle; discontinued; June 6th, 2022-osimertinib 80 mg a day  # NGS/MOLECULAR TESTS:POSITIVE for EGFR mutation deletion 19    # PALLIATIVE CARE EVALUATION:  # PAIN MANAGEMENT:    DIAGNOSIS: Lung cancer  STAGE:   IIIC      ;  GOALS:Palliatve  CURRENT/MOST RECENT THERAPY : Osiemertinib      Primary cancer of right upper lobe of lung ([place])  06/30/2020 Initial Diagnosis   Primary cancer of right upper lobe of lung ([place])   06/30/2020 Cancer Staging   Staging form: Lung, AJCC 8th Edition - Clinical: Stage IIIC (cT4, cN3, cM0) - Signed by Cammie Sickle, MD on 06/30/2020 Histopathologic type: Adenocarcinoma, NOS   07/05/2020 -  Chemotherapy    Patient is on Treatment Plan: LUNG CARBOPLATIN / PEMETREXED / PEMBROLIZUMAB Q21D INDUCTION X 4 CYCLES / MAINTENANCE PEMETREXED + PEMBROLIZUMAB          HISTORY OF PRESENTING ILLNESS:  She is accompanied by husband.  Ambulating independently.   Megan Shields 75 y.o.  female  Stage IV/adenocarcinoma the lung EGFR mutated-currently on osimertinib is here for follow-up/review results of the CT scan.  No nausea no vomiting no shortness of breath.  No chest pain.  No diarrhea.  Chronic joint pains.  Review of Systems  Constitutional:  Positive  for malaise/fatigue. Negative for chills, diaphoresis, fever and weight loss.  HENT:  Negative for nosebleeds and sore throat.   Eyes:  Negative for double vision.  Respiratory:  Negative for hemoptysis, sputum production and wheezing.   Cardiovascular:  Negative for chest pain, palpitations, orthopnea and leg swelling.  Gastrointestinal:  Negative for abdominal pain, blood in stool, constipation, diarrhea, heartburn, melena, nausea and vomiting.  Genitourinary:  Negative for dysuria, frequency and urgency.  Musculoskeletal:  Negative for back pain and joint pain.  Neurological:  Negative for dizziness, tingling, focal weakness, weakness and headaches.  Endo/Heme/Allergies:  Does not bruise/bleed easily.  Psychiatric/Behavioral:  Negative for depression. The patient is not nervous/anxious and does not have insomnia.     MEDICAL HISTORY:  Past Medical History:  Diagnosis Date  . Anemia    vitamin d deficiency  . Arthritis   . Chronic cough   . Depression   . Diabetes mellitus without complication ([place])   . Dyspnea   . Hypertension   . ILD (interstitial lung disease) ([place])   . Obesity   . Obesity    BMI 49.44  . Pneumonia   . Pneumonia due to 2019 novel coronavirus 2022   community acquired also. covid positive May 2022  . PONV (postoperative nausea and vomiting)   . Primary cancer of right upper lobe of lung ([place]) 06/30/2020  . Rosacea   . Shoulder contusion 11/22/2015    SURGICAL HISTORY: Past Surgical History:  Procedure Laterality Date  . BLEPHAROPLASTY Bilateral 12/19/2017  . CARPAL TUNNEL RELEASE Bilateral 1985  . CHOLECYSTECTOMY    . COLONOSCOPY WITH  PROPOFOL N/A 03/07/2017   Procedure: COLONOSCOPY WITH PROPOFOL;  Surgeon: [name], MD;  Location: [facility] ENDOSCOPY;  Service: Gastroenterology;  Laterality: N/A;  . DILATION AND CURETTAGE OF UTERUS  12/2011   benign endometrial polyp  . HALLUX VALGUS AUSTIN Right 09/02/2014   Procedure: [place];  Surgeon:  Samara Deist, DPM;  Location: [facility] ORS;  Service: Podiatry;  Laterality: Right;  . HERNIA REPAIR    . JOINT REPLACEMENT Bilateral    TKR  . LAPAROSCOPIC GASTRIC BANDING  2010   Lap Band  . LAPAROSCOPIC TOTAL HYSTERECTOMY  12/23/2019  . lymph node removed Right   . TOTAL KNEE ARTHROPLASTY Bilateral 2003, 2004  . TOTAL KNEE ARTHROPLASTY Bilateral   . VENTRAL HERNIA REPAIR  2008    SOCIAL HISTORY: Social History   Socioeconomic History  . Marital status: Married    Spouse name: Fritz Pickerel  . Number of children: Not on file  . Years of education: Not on file  . Highest education level: Not on file  Occupational History  . Occupation: Probation officer    Comment: RETIRED  Tobacco Use  . Smoking status: Never  . Smokeless tobacco: Never  Vaping Use  . Vaping Use: Never used  Substance and Sexual Activity  . Alcohol use: No    Alcohol/week: 0.0 standard drinks  . Drug use: No  . Sexual activity: Not Currently  Other Topics Concern  . Not on file  Social History Narrative   Never smoked; no alcohol. Lives in [place] with husband . Home maker.    Daughter lives next door. 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 Difficulty of Paying Living Expenses: Not hard at all  Food Insecurity: No Food Insecurity  . Worried About Charity fundraiser in the Last Year: Never true  . Ran Out of Food in the Last Year: Never true  Transportation Needs: No Transportation Needs  . Lack of Transportation (Medical): No  . Lack of Transportation (Non-Medical): No  Physical Activity: Insufficiently Active  . Days of Exercise per Week: 3 days  . Minutes of Exercise per Session: 30 min  Stress: No Stress Concern Present  . Feeling of Stress : Not at all  Social Connections: Moderately Isolated  . Frequency of Communication with Friends and Family: More than three times a week  . Frequency of Social Gatherings with  Friends and Family: More than three times a week  . Attends Religious Services: Never  . Active Member of Clubs or Organizations: No  . Attends Archivist Meetings: Never  . Marital Status: Married  Human resources officer Violence: Not At Risk  . Fear of Current or Ex-Partner: No  . Emotionally Abused: No  . Physically Abused: No  . Sexually Abused: No    FAMILY HISTORY: Family History  Problem Relation Age of Onset  . Breast cancer Mother 47  . Breast cancer Paternal Aunt        3 pat aunts  . Liver disease Brother        died 19    ALLERGIES:  is allergic to pneumococcal vaccines, wound dressing adhesive, amoxicillin, codeine, and penicillins.  MEDICATIONS:  Current Outpatient Medications  Medication Sig Dispense Refill  . acetaminophen (TYLENOL) 500 MG tablet Take 1,000 mg by mouth at bedtime as needed for moderate pain.    Marland Kitchen amLODipine (NORVASC) 5 MG tablet Take 1 tablet (5 mg  total) by mouth daily. 90 tablet 1  . celecoxib (CELEBREX) 200 MG capsule TAKE 1 CAPSULE DAILY 90 capsule 3  . cholecalciferol (VITAMIN D3) 25 MCG (1000 UNIT) tablet Take 1,000 Units by mouth daily.    . folic acid (FOLVITE) 1 MG tablet Take 1 tablet (1 mg total) by mouth daily. 90 tablet 0  . FREESTYLE LITE test strip TEST BLOOD SUGAR TWICE A DAY 100 strip 6  . glipiZIDE (GLIPIZIDE XL) 2.5 MG 24 hr tablet Take 1 tablet (2.5 mg total) by mouth daily with breakfast. 90 tablet 1  . glycopyrrolate (ROBINUL) 1 MG tablet     . Lancets (FREESTYLE) lancets TEST BLOOD SUGAR TWICE A DAY 100 each 6  . olmesartan-hydrochlorothiazide (BENICAR HCT) 40-25 MG tablet Take 1 tablet by mouth daily. 90 tablet 3  . prochlorperazine (COMPAZINE) 10 MG tablet Take 1 tablet (10 mg total) by mouth every 6 (six) hours as needed for nausea or vomiting. 40 tablet 1  . meclizine (ANTIVERT) 25 MG tablet 1 pill at night as needed; if dizziness not improved can take one pill every 8 hours as needed/tolerated. (Patient not taking:  Reported on 11/14/2020) 30 tablet 0  . ondansetron (ZOFRAN) 8 MG tablet One pill every 8 hours as needed for nausea/vomitting. (Patient not taking: Reported on 11/14/2020) 40 tablet 1  . osimertinib mesylate (TAGRISSO) 80 MG tablet Take 1 tablet (80 mg total) by mouth daily. 30 tablet 2   No current facility-administered medications for this visit.      Marland Kitchen  PHYSICAL EXAMINATION: ECOG PERFORMANCE STATUS: 0 - Asymptomatic  Vitals:   11/14/20 1014  BP: (!) 149/84  Pulse: (!) 59  Resp: 18  Temp: (!) 97.4 F (36.3 C)  SpO2: 100%   Filed Weights   11/14/20 1014  Weight: 298 lb 1 oz (135.2 kg)    Physical Exam Constitutional:      Comments: Obese.    HENT:     Head: Normocephalic and atraumatic.     Mouth/Throat:     Pharynx: No oropharyngeal exudate.  Eyes:     Pupils: Pupils are equal, round, and reactive to light.  Cardiovascular:     Rate and Rhythm: Normal rate and regular rhythm.  Pulmonary:     Effort: No respiratory distress.     Breath sounds: No wheezing.     Comments: Clear to auscultation bilaterally. Abdominal:     General: Bowel sounds are normal. There is no distension.     Palpations: Abdomen is soft. There is no mass.     Tenderness: There is no abdominal tenderness. There is no guarding or rebound.  Musculoskeletal:        General: No tenderness. Normal range of motion.     Cervical back: Normal range of motion and neck supple.  Skin:    General: Skin is warm.     Comments: Maculopapular rash torso/upper; upper extremities.  Neurological:     Mental Status: She is alert and oriented to person, place, and time.  Psychiatric:        Mood and Affect: Affect normal.     LABORATORY DATA:  I have reviewed the data as listed Lab Results  Component Value Date   WBC 4.0 11/14/2020   HGB 13.5 11/14/2020   HCT 41.9 11/14/2020   MCV 85.3 11/14/2020   PLT 160 11/14/2020   Recent Labs    09/19/20 0941 10/17/20 1001 11/14/20 0957  NA 136 135 135  K  4.2 3.7 3.6  CL 100 96* 99  CO2 31 31 32  GLUCOSE 130* 61* 66*  BUN 20 26* 19  CREATININE 1.09* 1.06* 1.14*  CALCIUM 9.3 9.4 9.1  GFRNONAA 54* 56* 51*  PROT 7.5 8.2* 7.8  ALBUMIN 3.2* 3.5 3.7  AST _0 ALT _1 ALKPHOS 44 51 50  BILITOT 0.6 0.7 0.7  BILIDIR 0.1  --   --     RADIOGRAPHIC STUDIES: I have personally reviewed the radiological images as listed and agreed with the findings in the report. CT Chest W Contrast  Result Date: 11/08/2020 CLINICAL DATA:  A 75 year old female with history of non-small cell lung cancer. Primary lesion in the RIGHT upper lobe, currently on systemic therapy by report. Found to have 3.5 cm upper lobe mass, pulmonary metastatic disease and findings of nodal disease in the chest on previous imaging. EXAM: CT CHEST WITH CONTRAST TECHNIQUE: Multidetector CT imaging of the chest was performed during intravenous contrast administration. CONTRAST:  9m OMNIPAQUE IOHEXOL 300 MG/ML  SOLN COMPARISON:  May 23, 2020, PET-CT of June 13, 2020. FINDINGS: Cardiovascular: Mild generalized atherosclerotic changes of the thoracic aorta. 3.5 cm main pulmonary artery. Mild cardiac enlargement without substantial pericardial effusion. Mediastinum/Nodes: RIGHT thyroid enlargement with heterogeneity 4.2 x 3.6 cm. LEFT thyroid enlarged to a lesser extent, findings are similar to prior imaging. Nodal disease in the chest with substantial decrease and or resolution since previous imaging. (Image 55/2) 5-6 mm RIGHT paratracheal lymph node previously as large is 15 mm. Resolution of many of the pre-vascular lymph nodes that were present on the prior study, still with a 6-7 mm lymph node in the prevascular region (image 48/2) no thoracic inlet lymphadenopathy. Small lymph nodes in the bilateral axilla not substantially changed and not previously FDG avid. No internal mammary adenopathy. Mild fullness of subcarinal nodal tissue at 9 mm to 10 mm greatest thickness previously 15  mm. No hilar adenopathy. Lungs/Pleura: RIGHT upper lobe mass 2.7 x 2.2 cm diminished in size previously 3.6 x 2.7 cm. Discrete nodularity with multiple micro nodules that were seen on the previous study, nearly completely resolved. Signs of septal thickening at the lung bases with subpleural reticulation is similar. Septal thickening seen elsewhere has improved though persists particularly in the RIGHT upper lobe. This irregular septal thickening remains suspicious for lymphangitic carcinomatosis. No new pulmonary nodule.  No pleural effusion.  Airways are patent. Upper Abdomen: No acute upper abdominal process. No visible lesion in the imaged portions of the liver. Post cholecystectomy. Hepatic steatosis. Imaged portions of pancreas, spleen and adrenal glands are unremarkable. No acute findings related to the upper portions of the bilateral kidneys. Gastric band in place appears more horizontally oriented than would be expected but is unchanged and not associated with gastric thickening or perigastric stranding. Musculoskeletal: No acute musculoskeletal process. No destructive bone finding. Spinal degenerative changes. IMPRESSION: Overall substantial interval improvement since previous imaging with significant interval decrease in the size of the RIGHT upper lobe mass and mediastinal adenopathy. Resolution or near complete resolution of micronodular pattern seen in the background, still with some evidence of ill-defined nodularity particularly in the RIGHT upper lobe and also with improvement of lymphangitic carcinomatosis. No evidence of new pulmonary nodule or mass. Enlarged thyroid gland, similar to prior imaging. Enlarged and heterogeneous Recommend thyroid ultrasound(Ref: J Am Coll Radiol. 2015 Feb;12(2): 143-50). Gastric band in place appears more horizontally oriented than would be expected but is unchanged and not associated with gastric thickening or perigastric  stranding. Correlate with any abdominal  symptoms. Position is unchanged dating back as far as 2014 but could be seen with slippage of a gastric band. Hepatic steatosis. Enlarged main pulmonary artery, can be seen in the setting of pulmonary arterial hypertension. Aortic Atherosclerosis (ICD10-I70.0). Electronically Signed   By: Zetta Bills M.D.   On: 11/08/2020 15:48     ASSESSMENT & PLAN:   Primary cancer of right upper lobe of lung ([place]) #Right upper lobe lung adenocarcinoma-STAGE IV  [T4N3M1]-EGFR mutated- Exon 19 del. on.Osimertinib 80 mg once a day [07/31/2020]. SEP 14th, 2022-CT scan shows overall substantial interval improvement since previous imaging with significant interval decrease in the size of the RIGHT upper lobe mass and mediastinal adenopathy; improvement of the carcinomatosis. No evidence of new pulmonary nodule or mass.   #Incidental enlarged thyroid gland, similar to prior imaging. Enlarged and heterogeneous.  Recommend thyroid ultrasound; ordered.  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  Asymptomatic monitor for now  # Insomnia: Continue melatonin 0.5-1 mg qhs.   #Chronic joint pains-recommend using Celebrex every other day [see below]; Tylenol as needed for pain.  Stable  #Diabetes /type II -PBF- BG- 66; decrease the glipizide to 2.5 mg XL- to [facility].   # Vertigo [s/p PT in past]- called in meclizein 25 mh  As needed.  Stable  # DISPOSITION:  # US thyroid in 1 week # follow up in 4 weeks- MD; labs- cbc/cmp/mag; -Dr.B  # I reviewed the blood work- with the patient in detail; also reviewed the imaging independently [as summarized above]; and with the patient in detail.     All questions were answered. The patient knows to call the clinic with any problems, questions or concerns.    Cammie Sickle, MD 11/14/2020 11:20 AM

## 2020-11-16 ENCOUNTER — Other Ambulatory Visit (HOSPITAL_COMMUNITY): Payer: Self-pay

## 2020-11-16 ENCOUNTER — Other Ambulatory Visit: Payer: Self-pay | Admitting: Internal Medicine

## 2020-11-17 ENCOUNTER — Ambulatory Visit
Admission: RE | Admit: 2020-11-17 | Discharge: 2020-11-17 | Disposition: A | Discharge status: Home / Self Care | Payer: Medicare Other | Source: Ambulatory Visit | Attending: Internal Medicine | Admitting: Internal Medicine

## 2020-11-17 ENCOUNTER — Other Ambulatory Visit: Payer: Self-pay

## 2020-11-17 ENCOUNTER — Encounter: Payer: Self-pay | Admitting: Internal Medicine

## 2020-11-17 DIAGNOSIS — C3411 Malignant neoplasm of upper lobe, right bronchus or lung: Secondary | ICD-10-CM | POA: Insufficient documentation

## 2020-11-17 DIAGNOSIS — E042 Nontoxic multinodular goiter: Secondary | ICD-10-CM | POA: Diagnosis not present

## 2020-11-17 DIAGNOSIS — E041 Nontoxic single thyroid nodule: Secondary | ICD-10-CM | POA: Insufficient documentation

## 2020-11-22 ENCOUNTER — Ambulatory Visit: Payer: Medicare Other

## 2020-11-30 ENCOUNTER — Telehealth: Payer: Self-pay | Admitting: Internal Medicine

## 2020-11-30 NOTE — Telephone Encounter (Signed)
Patient declined the Medicare Wellness Visit with [facility] due to all she has going on right not.   I asked if we could call next year to scheduled and she declined

## 2020-12-12 ENCOUNTER — Other Ambulatory Visit (HOSPITAL_COMMUNITY): Payer: Self-pay

## 2020-12-12 ENCOUNTER — Inpatient Hospital Stay (HOSPITAL_BASED_OUTPATIENT_CLINIC_OR_DEPARTMENT_OTHER): Payer: Medicare Other | Admitting: Internal Medicine

## 2020-12-12 ENCOUNTER — Other Ambulatory Visit: Payer: Self-pay

## 2020-12-12 ENCOUNTER — Inpatient Hospital Stay: Payer: Medicare Other | Attending: Internal Medicine

## 2020-12-12 DIAGNOSIS — M2550 Pain in unspecified joint: Secondary | ICD-10-CM | POA: Insufficient documentation

## 2020-12-12 DIAGNOSIS — Z9221 Personal history of antineoplastic chemotherapy: Secondary | ICD-10-CM | POA: Diagnosis not present

## 2020-12-12 DIAGNOSIS — C3411 Malignant neoplasm of upper lobe, right bronchus or lung: Secondary | ICD-10-CM

## 2020-12-12 DIAGNOSIS — Z7984 Long term (current) use of oral hypoglycemic drugs: Secondary | ICD-10-CM | POA: Insufficient documentation

## 2020-12-12 DIAGNOSIS — I10 Essential (primary) hypertension: Secondary | ICD-10-CM | POA: Insufficient documentation

## 2020-12-12 DIAGNOSIS — Z79899 Other long term (current) drug therapy: Secondary | ICD-10-CM | POA: Diagnosis not present

## 2020-12-12 DIAGNOSIS — G4700 Insomnia, unspecified: Secondary | ICD-10-CM | POA: Diagnosis not present

## 2020-12-12 DIAGNOSIS — R42 Dizziness and giddiness: Secondary | ICD-10-CM | POA: Diagnosis not present

## 2020-12-12 DIAGNOSIS — G8929 Other chronic pain: Secondary | ICD-10-CM | POA: Insufficient documentation

## 2020-12-12 DIAGNOSIS — L03032 Cellulitis of left toe: Secondary | ICD-10-CM | POA: Insufficient documentation

## 2020-12-12 DIAGNOSIS — E119 Type 2 diabetes mellitus without complications: Secondary | ICD-10-CM | POA: Diagnosis not present

## 2020-12-12 LAB — CBC WITH DIFFERENTIAL/PLATELET
Abs Immature Granulocytes: 0.01 10*3/uL (ref 0.00–0.07)
Basophils Absolute: 0 10*3/uL (ref 0.0–0.1)
Basophils Relative: 1 %
Eosinophils Absolute: 0.2 10*3/uL (ref 0.0–0.5)
Eosinophils Relative: 4 %
HCT: 41.7 % (ref 36.0–46.0)
Hemoglobin: 13.4 g/dL (ref 12.0–15.0)
Immature Granulocytes: 0 %
Lymphocytes Relative: 25 %
Lymphs Abs: 1 10*3/uL (ref 0.7–4.0)
MCH: 27.4 pg (ref 26.0–34.0)
MCHC: 32.1 g/dL (ref 30.0–36.0)
MCV: 85.3 fL (ref 80.0–100.0)
Monocytes Absolute: 0.6 10*3/uL (ref 0.1–1.0)
Monocytes Relative: 14 %
Neutro Abs: 2.1 10*3/uL (ref 1.7–7.7)
Neutrophils Relative %: 56 %
Platelets: 143 10*3/uL — ABNORMAL LOW (ref 150–400)
RBC: 4.89 MIL/uL (ref 3.87–5.11)
RDW: 13.5 % (ref 11.5–15.5)
WBC: 3.8 10*3/uL — ABNORMAL LOW (ref 4.0–10.5)
nRBC: 0 % (ref 0.0–0.2)

## 2020-12-12 LAB — COMPREHENSIVE METABOLIC PANEL
ALT: 10 U/L (ref 0–44)
AST: 17 U/L (ref 15–41)
Albumin: 3.6 g/dL (ref 3.5–5.0)
Alkaline Phosphatase: 51 U/L (ref 38–126)
Anion gap: 8 (ref 5–15)
BUN: 24 mg/dL — ABNORMAL HIGH (ref 8–23)
CO2: 31 mmol/L (ref 22–32)
Calcium: 9.4 mg/dL (ref 8.9–10.3)
Chloride: 96 mmol/L — ABNORMAL LOW (ref 98–111)
Creatinine, Ser: 1.2 mg/dL — ABNORMAL HIGH (ref 0.44–1.00)
GFR, Estimated: 48 mL/min — ABNORMAL LOW (ref >60)
Glucose, Bld: 116 mg/dL — ABNORMAL HIGH (ref 70–99)
Potassium: 4.4 mmol/L (ref 3.5–5.1)
Sodium: 135 mmol/L (ref 135–145)
Total Bilirubin: 0.4 mg/dL (ref 0.3–1.2)
Total Protein: 8.1 g/dL (ref 6.5–8.1)

## 2020-12-12 LAB — MAGNESIUM: Magnesium: 1.8 mg/dL (ref 1.7–2.4)

## 2020-12-12 NOTE — Progress Notes (Signed)
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  [Dr.Byrnett & Dr.Fleming]; May 2022-MRI brain-NEG.   # MAY 11th, 2022- carbo-alimta x1 cycle; discontinued; June 6th, 2022-osimertinib 80 mg a day  #Thyroid -enlargement; recommend biopsy per radiology; patient /husband has been reluctant  # Incidental-out of place Gastric band [since 2014] gastric band.  Asymptomatic monitor for now  # NGS/MOLECULAR TESTS:POSITIVE for EGFR mutation deletion 19    # PALLIATIVE CARE EVALUATION:  # PAIN MANAGEMENT:    DIAGNOSIS: Lung cancer  STAGE:   IIIC      ;  GOALS:Palliatve  CURRENT/MOST RECENT THERAPY : Osiemertinib      Primary cancer of right upper lobe of lung ([place])  06/30/2020 Initial Diagnosis   Primary cancer of right upper lobe of lung ([place])   06/30/2020 Cancer Staging   Staging form: Lung, AJCC 8th Edition - Clinical: Stage IIIC (cT4, cN3, cM0) - Signed by Cammie Sickle, MD on 06/30/2020 Histopathologic type: Adenocarcinoma, NOS   07/05/2020 -  Chemotherapy    Patient is on Treatment Plan: LUNG CARBOPLATIN / PEMETREXED / PEMBROLIZUMAB Q21D INDUCTION X 4 CYCLES / MAINTENANCE PEMETREXED + PEMBROLIZUMAB          HISTORY OF PRESENTING ILLNESS:  She is accompanied by husband.  Ambulating independently.   Megan Shields 75 y.o.  female  Stage IV/adenocarcinoma the lung EGFR mutated-currently on osimertinib is here  for follow-up/review results of the ultrasound neck   Patient complains of mild vertigo.  Improved on using meclizine.  However not resolved.  Complains of mild redness of the left big toenail.  No trauma.  Complains of whitening of her fingernails.  No nausea no vomiting no shortness of breath.  No chest pain.  No diarrhea.  Chronic joint pains.  Review of Systems  Constitutional:  Positive for malaise/fatigue. Negative for chills, diaphoresis, fever and weight loss.  HENT:  Negative for nosebleeds and sore throat.   Eyes:  Negative for double vision.  Respiratory:  Negative for hemoptysis, sputum production and wheezing.   Cardiovascular:  Negative for chest pain, palpitations, orthopnea and leg swelling.  Gastrointestinal:  Negative for abdominal pain, blood in stool, constipation, diarrhea, heartburn, melena, nausea and vomiting.  Genitourinary:  Negative for dysuria, frequency and urgency.  Musculoskeletal:  Negative for back pain and joint pain.  Neurological:  Negative for dizziness, tingling, focal weakness, weakness and headaches.  Endo/Heme/Allergies:  Does not bruise/bleed easily.  Psychiatric/Behavioral:  Negative for depression. The patient is not nervous/anxious and does not have insomnia.     MEDICAL HISTORY:  Past Medical History:  Diagnosis Date   Anemia    vitamin d deficiency   Arthritis    Chronic cough    Depression    Diabetes mellitus without complication ([place])    Dyspnea    Hypertension    ILD (interstitial lung disease) ([place])    Obesity    Obesity    BMI 49.44   Pneumonia    Pneumonia due to 2019 novel coronavirus 2022   community acquired also. covid positive  May 2022   PONV (postoperative nausea and vomiting)    Primary cancer of right upper lobe of lung ([place]) 06/30/2020   Rosacea    Shoulder contusion 11/22/2015    SURGICAL HISTORY: Past Surgical History:  Procedure Laterality Date   BLEPHAROPLASTY Bilateral 12/19/2017   CARPAL TUNNEL RELEASE  Bilateral 1985   CHOLECYSTECTOMY     COLONOSCOPY WITH PROPOFOL N/A 03/07/2017   Procedure: COLONOSCOPY WITH PROPOFOL;  Surgeon: [name], MD;  Location: [facility] ENDOSCOPY;  Service: Gastroenterology;  Laterality: N/A;   DILATION AND CURETTAGE OF UTERUS  12/2011   benign endometrial polyp   HALLUX VALGUS AUSTIN Right 09/02/2014   Procedure: [place];  Surgeon: Samara Deist, DPM;  Location: [facility] ORS;  Service: Podiatry;  Laterality: Right;   HERNIA REPAIR     JOINT REPLACEMENT Bilateral    TKR   LAPAROSCOPIC GASTRIC BANDING  2010   Lap Band   LAPAROSCOPIC TOTAL HYSTERECTOMY  12/23/2019   lymph node removed Right    TOTAL KNEE ARTHROPLASTY Bilateral 2003, 2004   TOTAL KNEE ARTHROPLASTY Bilateral    VENTRAL HERNIA REPAIR  2008    SOCIAL HISTORY: Social History   Socioeconomic History   Marital status: Married    Spouse name: Fritz Pickerel   Number of children: Not on file   Years of education: Not on file   Highest education level: Not on file  Occupational History   Occupation: Probation officer    Comment: RETIRED  Tobacco Use   Smoking status: Never   Smokeless tobacco: Never  Vaping Use   Vaping Use: Never used  Substance and Sexual Activity   Alcohol use: No    Alcohol/week: 0.0 standard drinks   Drug use: No   Sexual activity: Not Currently  Other Topics Concern   Not on file  Social History Narrative   Never smoked; no alcohol. Lives in [place] with husband . Home maker.    Daughter lives next door. 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   Social Determinants of Health   Financial Resource Strain: Not on file  Food Insecurity: Not on file  Transportation Needs: Not on file  Physical Activity: Not on file  Stress: Not on file  Social Connections: Not on file  Intimate Partner Violence: Not on file    FAMILY HISTORY: Family History  Problem Relation Age of Onset   Breast cancer Mother 24   Breast cancer Paternal Aunt        3 pat aunts   Liver  disease Brother        died 44    ALLERGIES:  is allergic to pneumococcal vaccines, wound dressing adhesive, amoxicillin, codeine, and penicillins.  MEDICATIONS:  Current Outpatient Medications  Medication Sig Dispense Refill   acetaminophen (TYLENOL) 500 MG tablet Take 1,000 mg by mouth at bedtime as needed for moderate pain.     amLODipine (NORVASC) 5 MG tablet Take 1 tablet (5 mg total) by mouth daily. 90 tablet 1   celecoxib (CELEBREX) 200 MG capsule TAKE 1 CAPSULE DAILY 90 capsule 3   cholecalciferol (VITAMIN D3) 25 MCG (1000 UNIT) tablet Take 1,000 Units by mouth daily.     FREESTYLE LITE test strip TEST BLOOD SUGAR TWICE A DAY 100 strip 6   glipiZIDE (GLIPIZIDE XL) 2.5 MG 24 hr tablet Take 1 tablet (2.5 mg total) by mouth daily with breakfast. 90 tablet 1   glycopyrrolate (ROBINUL) 1 MG tablet      Lancets (FREESTYLE) lancets TEST BLOOD  SUGAR TWICE A DAY 100 each 6   olmesartan-hydrochlorothiazide (BENICAR HCT) 40-25 MG tablet Take 1 tablet by mouth daily. 90 tablet 3   osimertinib mesylate (TAGRISSO) 80 MG tablet Take 1 tablet (80 mg total) by mouth daily. 30 tablet 2   osimertinib mesylate (TAGRISSO) 80 MG tablet Take 1 tablet (80 mg total) by mouth daily. 30 tablet 2   prochlorperazine (COMPAZINE) 10 MG tablet Take 1 tablet (10 mg total) by mouth every 6 (six) hours as needed for nausea or vomiting. 40 tablet 1   folic acid (FOLVITE) 1 MG tablet Take 1 tablet (1 mg total) by mouth daily. (Patient not taking: Reported on 12/12/2020) 90 tablet 0   meclizine (ANTIVERT) 25 MG tablet 1 pill at night as needed; if dizziness not improved can take one pill every 8 hours as needed/tolerated. (Patient not taking: No sig reported) 30 tablet 0   ondansetron (ZOFRAN) 8 MG tablet One pill every 8 hours as needed for nausea/vomitting. (Patient not taking: No sig reported) 40 tablet 1   No current facility-administered medications for this visit.      Marland Kitchen  PHYSICAL EXAMINATION: ECOG  PERFORMANCE STATUS: 0 - Asymptomatic  Vitals:   12/12/20 1112  BP: (!) 165/83  Pulse: 66  Resp: 18  Temp: 97.7 F (36.5 C)  SpO2: 97%   Filed Weights   12/12/20 1112  Weight: (!) 304 lb (137.9 kg)    Physical Exam Constitutional:      Comments: Obese.    HENT:     Head: Normocephalic and atraumatic.     Mouth/Throat:     Pharynx: No oropharyngeal exudate.  Eyes:     Pupils: Pupils are equal, round, and reactive to light.  Cardiovascular:     Rate and Rhythm: Normal rate and regular rhythm.  Pulmonary:     Effort: No respiratory distress.     Breath sounds: No wheezing.     Comments: Clear to auscultation bilaterally. Abdominal:     General: Bowel sounds are normal. There is no distension.     Palpations: Abdomen is soft. There is no mass.     Tenderness: There is no abdominal tenderness. There is no guarding or rebound.  Musculoskeletal:        General: No tenderness. Normal range of motion.     Cervical back: Normal range of motion and neck supple.  Skin:    General: Skin is warm.  Neurological:     Mental Status: She is alert and oriented to person, place, and time.  Psychiatric:        Mood and Affect: Affect normal.   Erythema of the ball of the left big toe.  No discharge noted.  LABORATORY DATA:  I have reviewed the data as listed Lab Results  Component Value Date   WBC 3.8 (L) 12/12/2020   HGB 13.4 12/12/2020   HCT 41.7 12/12/2020   MCV 85.3 12/12/2020   PLT 143 (L) 12/12/2020   Recent Labs    09/19/20 0941 10/17/20 1001 11/14/20 0957 12/12/20 1048  NA 136 135 135 135  K 4.2 3.7 3.6 4.4  CL 100 96* 99 96*  CO2 31 31 32 31  GLUCOSE 130* 61* 66* 116*  BUN 20 26* 19 24*  CREATININE 1.09* 1.06* 1.14* 1.20*  CALCIUM 9.3 9.4 9.1 9.4  GFRNONAA 54* 56* 51* 48*  PROT 7.5 8.2* 7.8 8.1  ALBUMIN 3.2* 3.5 3.7 3.6  AST '18 19 20 17  ' ALT 11 11 10  10  ALKPHOS 44 51 50 51  BILITOT 0.6 0.7 0.7 0.4  BILIDIR 0.1  --   --   --     RADIOGRAPHIC  STUDIES: I have personally reviewed the radiological images as listed and agreed with the findings in the report. US THYROID  Result Date: 11/17/2020 CLINICAL DATA:  Enlarged thyroid gland EXAM: THYROID ULTRASOUND TECHNIQUE: Ultrasound examination of the thyroid gland and adjacent soft tissues was performed. COMPARISON:  None. FINDINGS: Parenchymal Echotexture: Moderately heterogenous Isthmus: 0.9 cm Right lobe: 5.5 x 3.0 x 3.6 cm Left lobe: 6.0 x 2.4 x 2.7 cm _________________________________________________________ Estimated total number of nodules >/= 1 cm: 4 Number of spongiform nodules >/=  2 cm not described below (TR1): 0 Number of mixed cystic and solid nodules >/= 1.5 cm not described below (TR2): 0 _________________________________________________________ Nodule labeled 1 refers to a 4.7 x 4.2 x 2.8 cm solid, isoechoic nodule (TR 3) in the right thyroid lobe. It encompasses nearly the entire right thyroid lobe. **Given size (>/= 2.5 cm) and appearance, fine needle aspiration of this mildly suspicious nodule should be considered based on TI-RADS criteria. Nodule labeled 2 refers to a 2.0 x 1.8 x 1.4 cm solid isoechoic nodule (TR 3) in the mid left thyroid lobe. *Given size (>/= 1.5 - 2.4 cm) and appearance, a follow-up ultrasound in 1 year should be considered based on TI-RADS criteria. Nodule labeled 3 refers to a 2.2 x 2.2 x 2.1 cm solid isoechoic nodule (TR 3) in the left thyroid lobe. *Given size (>/= 1.5 - 2.4 cm) and appearance, a follow-up ultrasound in 1 year should be considered based on TI-RADS criteria. Nodule labeled 4 refers to a 2.4 x 1.9 x 1.7 cm solid, isoechoic nodule (TR 3) in the inferior left thyroid lobe. *Given size (>/= 1.5 - 2.4 cm) and appearance, a follow-up ultrasound in 1 year should be considered based on TI-RADS criteria. IMPRESSION: 1. Multinodular thyroid gland. 2. Large right-sided thyroid nodule labeled 1, which encompasses nearly the entire right hemi thyroid, meets  criteria for biopsy. 3. Multiple additional TR 3 nodules labeled 2-4 in the left thyroid lobe meet criteria for follow-up ultrasound in 1 year. The above is in keeping with the ACR TI-RADS recommendations - J Am Coll Radiol 2017;14:587-595. Electronically Signed   By: Albin Felling M.D.   On: 11/17/2020 11:07     ASSESSMENT & PLAN:   Primary cancer of right upper lobe of lung ([place]) #Right upper lobe lung adenocarcinoma-STAGE IV  [T4N3M1]-EGFR mutated- Exon 19 del. On. Osimertinib 80 mg once a day [07/31/2020]. SEP 14th, 2022-CT scan shows overall substantial interval improvement since previous imaging with significant interval decrease in the size of the RIGHT upper lobe mass and mediastinal adenopathy; improvement of the carcinomatosis. No evidence of new pulmonary nodule or mass.   #Paronychia left big toe/ Nail changesr-recommend Epson salt soaking/antibacterial wash; if not improved would recommend antibiotics.  Written instructions given.  #Incidental large thyroid goiter: Reviewed the ultrasound-recommendation for right-sided biopsy; left-sided monitoring.  However patient has been not on further work-up at this time/especially the context of her advanced lung malignancy.  PET scan negative-in April 2022 for any obvious focal active lesions in the thyroid.  I think is reasonable to monitor with an ultrasound in 6 months.  # Insomnia: Continue melatonin 0.5-1 mg qhs.  Stable  #Chronic joint pains-recommend using Celebrex every other day [see below]; Tylenol as needed for pain.  Stable  #Diabetes /type II -PBF- BG- 116-continue glipizide  to 2.5 mg XL-  # Vertigo [s/p PT in past]- called in meclizein 25 mh  As needed.  Stable but not resolved wants to hold off ENT evaluation.  Interested in PT/defer to PCP.   # Flu shot vaccinations: Recommend flu shot-discussed the benefits outweigh the risks.  Patient interested.  # DISPOSITION:  # follow up in 6 weeks- MD; labs- cbc/cmp/mag;  -Dr.B    All questions were answered. The patient knows to call the clinic with any problems, questions or concerns.    Cammie Sickle, MD 12/12/2020 1:16 PM

## 2020-12-12 NOTE — Assessment & Plan Note (Addendum)
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 On. Osimertinib 80 mg once a day [07/31/2020]. SEP 14th, 2022-CT scan shows overall substantial interval improvement since previous imaging with significant interval decrease in the size of the RIGHT upper lobe mass and mediastinal adenopathy; improvement of the carcinomatosis. No evidence of new pulmonary nodule or mass.   #Paronychia left big toe/ Nail changesr-recommend Epson salt soaking/antibacterial wash; if not improved would recommend antibiotics.  Written instructions given.  #Incidental large thyroid goiter: Reviewed the ultrasound-recommendation for right-sided biopsy; left-sided monitoring.  However patient has been not on further work-up at this time/especially the context of her advanced lung malignancy.  PET scan negative-in April 2022 for any obvious focal active lesions in the thyroid.  I think is reasonable to monitor with an ultrasound in 6 months.  # Insomnia: Continue melatonin 0.5-1 mg qhs.  Stable  #Chronic joint pains-recommend using Celebrex every other day [see below]; Tylenol as needed for pain.  Stable  #Diabetes /type II -PBF- BG- 116-continue glipizide to 2.5 mg XL-  # Vertigo [s/p PT in past]- called in meclizein 25 mh  As needed.  Stable but not resolved wants to hold off ENT evaluation.  Interested in PT/defer to PCP.   # Flu shot vaccinations: Recommend flu shot-discussed the benefits outweigh the risks.  Patient interested.  # DISPOSITION:  # follow up in 6 weeks- MD; labs- cbc/cmp/mag; -Dr.B

## 2020-12-12 NOTE — Patient Instructions (Signed)
#   Add 1/2 cup of epsom salt in a basin/ 1/2 bucket in luke water.   # Wash your feet with anti-bacterail soap prior to using espom salt water soaks.  # Soak your feet for approximately fifteen minutes three to four times a day .   #after using the soaks each time dry the feet well

## 2020-12-12 NOTE — Progress Notes (Signed)
Patient reports vertigo and wants to know if she still needs to take folic acid, if so, she needs a new rx.

## 2020-12-28 ENCOUNTER — Other Ambulatory Visit: Payer: Self-pay | Admitting: Internal Medicine

## 2020-12-28 DIAGNOSIS — I10 Essential (primary) hypertension: Secondary | ICD-10-CM

## 2021-01-01 ENCOUNTER — Encounter: Payer: Self-pay | Admitting: *Deleted

## 2021-01-08 ENCOUNTER — Telehealth: Payer: Self-pay | Admitting: Internal Medicine

## 2021-01-08 NOTE — Telephone Encounter (Signed)
Patient declined the Medicare Wellness Visit with [facility]    Patient does not want to be contacted again about scheduling.   Stated she does not feel it is necessary

## 2021-01-23 DIAGNOSIS — Z23 Encounter for immunization: Secondary | ICD-10-CM | POA: Diagnosis not present

## 2021-01-24 ENCOUNTER — Inpatient Hospital Stay: Payer: Medicare Other | Attending: Internal Medicine

## 2021-01-24 ENCOUNTER — Inpatient Hospital Stay (HOSPITAL_BASED_OUTPATIENT_CLINIC_OR_DEPARTMENT_OTHER): Payer: Medicare Other | Admitting: Internal Medicine

## 2021-01-24 ENCOUNTER — Encounter: Payer: Self-pay | Admitting: Internal Medicine

## 2021-01-24 ENCOUNTER — Other Ambulatory Visit: Payer: Self-pay

## 2021-01-24 VITALS — BP 170/86 | HR 112 | Temp 97.9°F | Resp 20 | Wt 307.4 lb

## 2021-01-24 DIAGNOSIS — G4700 Insomnia, unspecified: Secondary | ICD-10-CM | POA: Diagnosis not present

## 2021-01-24 DIAGNOSIS — I10 Essential (primary) hypertension: Secondary | ICD-10-CM | POA: Insufficient documentation

## 2021-01-24 DIAGNOSIS — E119 Type 2 diabetes mellitus without complications: Secondary | ICD-10-CM | POA: Diagnosis not present

## 2021-01-24 DIAGNOSIS — C3411 Malignant neoplasm of upper lobe, right bronchus or lung: Secondary | ICD-10-CM | POA: Diagnosis not present

## 2021-01-24 DIAGNOSIS — R0602 Shortness of breath: Secondary | ICD-10-CM | POA: Insufficient documentation

## 2021-01-24 DIAGNOSIS — R0600 Dyspnea, unspecified: Secondary | ICD-10-CM

## 2021-01-24 DIAGNOSIS — Z79899 Other long term (current) drug therapy: Secondary | ICD-10-CM | POA: Insufficient documentation

## 2021-01-24 DIAGNOSIS — Z7984 Long term (current) use of oral hypoglycemic drugs: Secondary | ICD-10-CM | POA: Insufficient documentation

## 2021-01-24 LAB — COMPREHENSIVE METABOLIC PANEL
ALT: 10 U/L (ref 0–44)
AST: 18 U/L (ref 15–41)
Albumin: 3.6 g/dL (ref 3.5–5.0)
Alkaline Phosphatase: 51 U/L (ref 38–126)
Anion gap: 3 — ABNORMAL LOW (ref 5–15)
BUN: 24 mg/dL — ABNORMAL HIGH (ref 8–23)
CO2: 31 mmol/L (ref 22–32)
Calcium: 9.3 mg/dL (ref 8.9–10.3)
Chloride: 99 mmol/L (ref 98–111)
Creatinine, Ser: 1.1 mg/dL — ABNORMAL HIGH (ref 0.44–1.00)
GFR, Estimated: 53 mL/min — ABNORMAL LOW (ref >60)
Glucose, Bld: 159 mg/dL — ABNORMAL HIGH (ref 70–99)
Potassium: 4.3 mmol/L (ref 3.5–5.1)
Sodium: 133 mmol/L — ABNORMAL LOW (ref 135–145)
Total Bilirubin: 0.7 mg/dL (ref 0.3–1.2)
Total Protein: 7.8 g/dL (ref 6.5–8.1)

## 2021-01-24 LAB — CBC WITH DIFFERENTIAL/PLATELET
Abs Immature Granulocytes: 0.01 10*3/uL (ref 0.00–0.07)
Basophils Absolute: 0 10*3/uL (ref 0.0–0.1)
Basophils Relative: 0 %
Eosinophils Absolute: 0.2 10*3/uL (ref 0.0–0.5)
Eosinophils Relative: 6 %
HCT: 43.8 % (ref 36.0–46.0)
Hemoglobin: 13.9 g/dL (ref 12.0–15.0)
Immature Granulocytes: 0 %
Lymphocytes Relative: 24 %
Lymphs Abs: 0.9 10*3/uL (ref 0.7–4.0)
MCH: 27 pg (ref 26.0–34.0)
MCHC: 31.7 g/dL (ref 30.0–36.0)
MCV: 85 fL (ref 80.0–100.0)
Monocytes Absolute: 0.4 10*3/uL (ref 0.1–1.0)
Monocytes Relative: 11 %
Neutro Abs: 2 10*3/uL (ref 1.7–7.7)
Neutrophils Relative %: 59 %
Platelets: 156 10*3/uL (ref 150–400)
RBC: 5.15 MIL/uL — ABNORMAL HIGH (ref 3.87–5.11)
RDW: 14.1 % (ref 11.5–15.5)
WBC: 3.5 10*3/uL — ABNORMAL LOW (ref 4.0–10.5)
nRBC: 0 % (ref 0.0–0.2)

## 2021-01-24 LAB — BRAIN NATRIURETIC PEPTIDE: B Natriuretic Peptide: 89.2 pg/mL (ref 0.0–100.0)

## 2021-01-24 LAB — MAGNESIUM: Magnesium: 1.8 mg/dL (ref 1.7–2.4)

## 2021-01-24 NOTE — Progress Notes (Signed)
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  [Dr.Byrnett & Dr.Fleming]; May 2022-MRI brain-NEG.   # MAY 11th, 2022- carbo-alimta x1 cycle; discontinued; June 6th, 2022-osimertinib 80 mg a day  #Thyroid -enlargement; recommend biopsy per radiology; patient /husband has been reluctant  # Incidental-out of place Gastric band [since 2014] gastric band.  Asymptomatic monitor for now  # NGS/MOLECULAR TESTS:POSITIVE for EGFR mutation deletion 19    # PALLIATIVE CARE EVALUATION:  # PAIN MANAGEMENT:    DIAGNOSIS: Lung cancer  STAGE:   IIIC      ;  GOALS:Palliatve  CURRENT/MOST RECENT THERAPY : Osiemertinib      Primary cancer of right upper lobe of lung ([place])  06/30/2020 Initial Diagnosis   Primary cancer of right upper lobe of lung ([place])   06/30/2020 Cancer Staging   Staging form: Lung, AJCC 8th Edition - Clinical: Stage IIIC (cT4, cN3, cM0) - Signed by Cammie Sickle, MD on 06/30/2020 Histopathologic type: Adenocarcinoma, NOS    07/05/2020 -  Chemotherapy    Patient is on Treatment Plan: LUNG CARBOPLATIN / PEMETREXED / PEMBROLIZUMAB Q21D INDUCTION X 4 CYCLES / MAINTENANCE PEMETREXED + PEMBROLIZUMAB          HISTORY OF PRESENTING ILLNESS:  She is accompanied by husband.  Ambulating independently.   Megan Shields 75 y.o.  female  Stage IV/adenocarcinoma the lung EGFR mutated-currently on osimertinib is here  for follow-up.  Patient complains of worsening shortness of breath with mild exertion.  Cough-on tussinex.  No swelling in the legs.  Continues to have of mild redness of the left big toenail.  Currently doing Epson salt bath. No nausea no vomiting . No chest pain.  No diarrhea.  Chronic joint pains.  Review of Systems  Constitutional:  Positive for malaise/fatigue. Negative for chills, diaphoresis, fever and weight loss.  HENT:  Negative for nosebleeds and sore throat.   Eyes:  Negative for double vision.  Respiratory:  Negative for hemoptysis, sputum production and wheezing.   Cardiovascular:  Negative for chest pain, palpitations, orthopnea and leg swelling.  Gastrointestinal:  Negative for abdominal pain, blood in stool, constipation, diarrhea, heartburn, melena, nausea and vomiting.  Genitourinary:  Negative for dysuria, frequency and urgency.  Musculoskeletal:  Negative for back pain and joint pain.  Neurological:  Negative for dizziness, tingling, focal weakness, weakness and headaches.  Endo/Heme/Allergies:  Does not bruise/bleed easily.  Psychiatric/Behavioral:  Negative for depression. The patient is not nervous/anxious and does not have insomnia.     MEDICAL HISTORY:  Past Medical History:  Diagnosis Date   Anemia    vitamin d deficiency   Arthritis    Chronic cough    Depression    Diabetes mellitus without complication ([place])    Dyspnea    Hypertension    ILD (interstitial lung disease) ([place])    Obesity    Obesity    BMI 49.44   Pneumonia    Pneumonia due to 2019 novel coronavirus 2022   community acquired also. covid positive May 2022   PONV (postoperative nausea  and vomiting)    Primary cancer of right upper lobe of lung ([place]) 06/30/2020   Rosacea    Shoulder contusion 11/22/2015    SURGICAL HISTORY: Past Surgical History:  Procedure Laterality Date   BLEPHAROPLASTY Bilateral 12/19/2017   CARPAL TUNNEL RELEASE Bilateral 1985   CHOLECYSTECTOMY      COLONOSCOPY WITH PROPOFOL N/A 03/07/2017   Procedure: COLONOSCOPY WITH PROPOFOL;  Surgeon: [name], MD;  Location: [facility] ENDOSCOPY;  Service: Gastroenterology;  Laterality: N/A;   DILATION AND CURETTAGE OF UTERUS  12/2011   benign endometrial polyp   HALLUX VALGUS AUSTIN Right 09/02/2014   Procedure: [place];  Surgeon: Samara Deist, DPM;  Location: [facility] ORS;  Service: Podiatry;  Laterality: Right;   HERNIA REPAIR     JOINT REPLACEMENT Bilateral    TKR   LAPAROSCOPIC GASTRIC BANDING  2010   Lap Band   LAPAROSCOPIC TOTAL HYSTERECTOMY  12/23/2019   lymph node removed Right    TOTAL KNEE ARTHROPLASTY Bilateral 2003, 2004   TOTAL KNEE ARTHROPLASTY Bilateral    VENTRAL HERNIA REPAIR  2008    SOCIAL HISTORY: Social History   Socioeconomic History   Marital status: Married    Spouse name: Fritz Pickerel   Number of children: Not on file   Years of education: Not on file   Highest education level: Not on file  Occupational History   Occupation: Probation officer    Comment: RETIRED  Tobacco Use   Smoking status: Never   Smokeless tobacco: Never  Vaping Use   Vaping Use: Never used  Substance and Sexual Activity   Alcohol use: No    Alcohol/week: 0.0 standard drinks   Drug use: No   Sexual activity: Not Currently  Other Topics Concern   Not on file  Social History Narrative   Never smoked; no alcohol. Lives in [place] with husband . Home maker.    Daughter lives next door. 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   Social Determinants of Health   Financial Resource Strain: Not on file  Food Insecurity: Not on file  Transportation Needs: Not on file  Physical Activity: Not on file  Stress: Not on file  Social Connections: Not on file  Intimate Partner Violence: Not on file    FAMILY HISTORY: Family History  Problem Relation Age of Onset   Breast cancer Mother 62   Breast cancer Paternal Aunt        3 pat aunts   Liver disease Brother        died 51     ALLERGIES:  is allergic to pneumococcal vaccines, wound dressing adhesive, amoxicillin, codeine, and penicillins.  MEDICATIONS:  Current Outpatient Medications  Medication Sig Dispense Refill   acetaminophen (TYLENOL) 500 MG tablet Take 1,000 mg by mouth at bedtime as needed for moderate pain.     amLODipine (NORVASC) 5 MG tablet Take 1 tablet (5 mg total) by mouth daily. 90 tablet 1   celecoxib (CELEBREX) 200 MG capsule TAKE 1 CAPSULE DAILY 90 capsule 3   cholecalciferol (VITAMIN D3) 25 MCG (1000 UNIT) tablet Take 1,000 Units by mouth daily.     folic acid (FOLVITE) 1 MG tablet Take 1 tablet (1 mg total) by mouth daily. 90 tablet 0   FREESTYLE LITE test strip TEST BLOOD SUGAR TWICE A DAY 100 strip 6   glipiZIDE (GLIPIZIDE XL) 2.5 MG 24 hr tablet Take 1 tablet (2.5 mg total) by mouth daily with breakfast. 90 tablet 1   glycopyrrolate (ROBINUL)  1 MG tablet      Lancets (FREESTYLE) lancets TEST BLOOD SUGAR TWICE A DAY 100 each 6   meclizine (ANTIVERT) 25 MG tablet 1 pill at night as needed; if dizziness not improved can take one pill every 8 hours as needed/tolerated. 30 tablet 0   olmesartan-hydrochlorothiazide (BENICAR HCT) 40-25 MG tablet TAKE 1 TABLET DAILY 90 tablet 1   osimertinib mesylate (TAGRISSO) 80 MG tablet Take 1 tablet (80 mg total) by mouth daily. 30 tablet 2   osimertinib mesylate (TAGRISSO) 80 MG tablet Take 1 tablet (80 mg total) by mouth daily. 30 tablet 2   ondansetron (ZOFRAN) 8 MG tablet One pill every 8 hours as needed for nausea/vomitting. (Patient not taking: Reported on 01/24/2021) 40 tablet 1   prochlorperazine (COMPAZINE) 10 MG tablet Take 1 tablet (10 mg total) by mouth every 6 (six) hours as needed for nausea or vomiting. (Patient not taking: Reported on 01/24/2021) 40 tablet 1   No current facility-administered medications for this visit.    Mild to moderate erythema of the left big toe/nail bed.  Nail intact  PHYSICAL EXAMINATION: ECOG PERFORMANCE  STATUS: 0 - Asymptomatic  Vitals:   01/24/21 1044 01/24/21 1100  BP: (!) 170/86   Pulse: 73 (!) 112  Resp: 20   Temp: 97.9 F (36.6 C)   SpO2:  96%   Filed Weights   01/24/21 1044  Weight: (!) 307 lb 6.4 oz (139.4 kg)    Physical Exam Constitutional:      Comments: Obese.    HENT:     Head: Normocephalic and atraumatic.     Mouth/Throat:     Pharynx: No oropharyngeal exudate.  Eyes:     Pupils: Pupils are equal, round, and reactive to light.  Cardiovascular:     Rate and Rhythm: Normal rate and regular rhythm.  Pulmonary:     Effort: No respiratory distress.     Breath sounds: No wheezing.     Comments: Clear to auscultation bilaterally. Abdominal:     General: Bowel sounds are normal. There is no distension.     Palpations: Abdomen is soft. There is no mass.     Tenderness: There is no abdominal tenderness. There is no guarding or rebound.  Musculoskeletal:        General: No tenderness. Normal range of motion.     Cervical back: Normal range of motion and neck supple.  Skin:    General: Skin is warm.  Neurological:     Mental Status: She is alert and oriented to person, place, and time.  Psychiatric:        Mood and Affect: Affect normal.   Erythema of the ball of the left big toe.  No discharge noted.  LABORATORY DATA:  I have reviewed the data as listed Lab Results  Component Value Date   WBC 3.5 (L) 01/24/2021   HGB 13.9 01/24/2021   HCT 43.8 01/24/2021   MCV 85.0 01/24/2021   PLT 156 01/24/2021   Recent Labs    09/19/20 0941 10/17/20 1001 11/14/20 0957 12/12/20 1048 01/24/21 0915  NA 136   < > 135 135 133*  K 4.2   < > 3.6 4.4 4.3  CL 100   < > 99 96* 99  CO2 31   < > 32 31 31  GLUCOSE 130*   < > 66* 116* 159*  BUN 20   < > 19 24* 24*  CREATININE 1.09*   < > 1.14* 1.20* 1.10*  CALCIUM 9.3   < > 9.1 9.4 9.3  GFRNONAA 54*   < > 51* 48* 53*  PROT 7.5   < > 7.8 8.1 7.8  ALBUMIN 3.2*   < > 3.7 3.6 3.6  AST 18   < > '20 17 18  ' ALT 11   < > '10  10 10  ' ALKPHOS 44   < > 50 51 51  BILITOT 0.6   < > 0.7 0.4 0.7  BILIDIR 0.1  --   --   --   --    < > = values in this interval not displayed.    RADIOGRAPHIC STUDIES: I have personally reviewed the radiological images as listed and agreed with the findings in the report. No results found.   ASSESSMENT & PLAN:   Primary cancer of right upper lobe of lung ([place]) #Right upper lobe lung adenocarcinoma-STAGE IV  [T4N3M1]-EGFR mutated- Exon 19 del. On. Osimertinib 80 mg once a day [07/31/2020]. SEP 14th, 2022-CT scan shows overall substantial interval improvement since previous imaging with significant interval decrease in the size of the RIGHT upper lobe mass and mediastinal adenopathy; improvement of the carcinomatosis. No evidence of new pulmonary nodule or mass.  Clinically stable-however see below  #Continue osimertinib 80 mg a day;   # increasing dyspnea-resting pulse ox 97; however ambulation 94-95; heart rate up to 112.-unlikely progression of malignancy.  Recommend CXR today; ? URI- s/p mucinex; cough drops; check home test COVID test.  If chest x-ray negative consider CTA.  BNP normal.  #Paronychia left big toe/ Nail changesr-s/p Epson salt soaking/antibacterial wash; if not improved would recommend antibiotics- STABLE>   #Incidental large thyroid goiter: [Bx recommended].-We will repeat ultrasound in 6 months.  # Insomnia: Continue melatonin 0.5-1 mg qhs.  Stable  #Chronic joint pains-recommend using Celebrex every other day [see below]; Tylenol as needed for pain.   Stable  #Diabetes /type II -PBF- BG- 116-continue glipizide to 2.5 mg XL-  # Vertigo [s/p PT in past]-prn  # Flu shot vaccinations:s/p flu shot  # DISPOSITION: ADD BNP today # follow up in 6 weeks- MD; labs- cbc/cmp/mag; -Dr.B    All questions were answered. The patient knows to call the clinic with any problems, questions or concerns.    Cammie Sickle, MD 01/24/2021 12:54 PM

## 2021-01-24 NOTE — Patient Instructions (Signed)
CXR today.  

## 2021-01-24 NOTE — Progress Notes (Signed)
Patient here for follow up she has no concerns today. BP elevate 170/86 will recheck after visit.

## 2021-01-24 NOTE — Assessment & Plan Note (Addendum)
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 On. Osimertinib 80 mg once a day [07/31/2020]. SEP 14th, 2022-CT scan shows overall substantial interval improvement since previous imaging with significant interval decrease in the size of the RIGHT upper lobe mass and mediastinal adenopathy; improvement of the carcinomatosis. No evidence of new pulmonary nodule or mass.  Clinically stable-however see below  #Continue osimertinib 80 mg a day;   # increasing dyspnea-resting pulse ox 97; however ambulation 94-95; heart rate up to 112.-unlikely progression of malignancy.  Recommend CXR today; ? URI- s/p mucinex; cough drops; check home test COVID test.  If chest x-ray negative consider CTA.  BNP normal.  #Paronychia left big toe/ Nail changesr-s/p Epson salt soaking/antibacterial wash; if not improved would recommend antibiotics- STABLE>   #Incidental large thyroid goiter: [Bx recommended].-We will repeat ultrasound in 6 months.  # Insomnia: Continue melatonin 0.5-1 mg qhs.  Stable  #Chronic joint pains-recommend using Celebrex every other day [see below]; Tylenol as needed for pain.   Stable  #Diabetes /type II -PBF- BG- 116-continue glipizide to 2.5 mg XL-  # Vertigo [s/p PT in past]-prn  # Flu shot vaccinations:s/p flu shot  # DISPOSITION: ADD BNP today # follow up in 6 weeks- MD; labs- cbc/cmp/mag; -Dr.B

## 2021-01-25 ENCOUNTER — Other Ambulatory Visit: Payer: Self-pay

## 2021-01-25 ENCOUNTER — Ambulatory Visit
Admission: RE | Admit: 2021-01-25 | Discharge: 2021-01-25 | Disposition: A | Discharge status: Home / Self Care | Payer: Medicare Other | Source: Ambulatory Visit | Attending: Internal Medicine | Admitting: Internal Medicine

## 2021-01-25 ENCOUNTER — Ambulatory Visit
Admission: RE | Admit: 2021-01-25 | Discharge: 2021-01-25 | Disposition: A | Discharge status: Home / Self Care | Payer: Medicare Other | Attending: Internal Medicine | Admitting: Internal Medicine

## 2021-01-25 DIAGNOSIS — C3411 Malignant neoplasm of upper lobe, right bronchus or lung: Secondary | ICD-10-CM | POA: Insufficient documentation

## 2021-01-25 DIAGNOSIS — R059 Cough, unspecified: Secondary | ICD-10-CM | POA: Diagnosis not present

## 2021-01-25 DIAGNOSIS — R911 Solitary pulmonary nodule: Secondary | ICD-10-CM | POA: Diagnosis not present

## 2021-01-25 DIAGNOSIS — J449 Chronic obstructive pulmonary disease, unspecified: Secondary | ICD-10-CM | POA: Diagnosis not present

## 2021-01-26 ENCOUNTER — Telehealth: Payer: Self-pay

## 2021-01-26 ENCOUNTER — Telehealth: Payer: Self-pay | Admitting: Internal Medicine

## 2021-01-26 DIAGNOSIS — R0609 Other forms of dyspnea: Secondary | ICD-10-CM

## 2021-01-26 DIAGNOSIS — C3411 Malignant neoplasm of upper lobe, right bronchus or lung: Secondary | ICD-10-CM

## 2021-01-26 NOTE — Telephone Encounter (Signed)
Spoke to pt re: CXR not worsened.   With re: ongoing dyspnea would recommend a stat CTA of the chest rule out PE.  Please schedule- Thanks

## 2021-01-26 NOTE — Telephone Encounter (Signed)
Message received from MD.  He has notified patient that chest xray not worsened.  He wants to order CTA of chest to rule out PE.  Informed MD that the schedulers have gone for the day and will be scheduled Monday.  Please schedule and inform pt of appointment details.

## 2021-01-29 ENCOUNTER — Encounter: Payer: Self-pay | Admitting: Internal Medicine

## 2021-01-29 ENCOUNTER — Ambulatory Visit
Admission: RE | Admit: 2021-01-29 | Discharge: 2021-01-29 | Disposition: A | Discharge status: Home / Self Care | Payer: Medicare Other | Source: Ambulatory Visit | Attending: Internal Medicine | Admitting: Internal Medicine

## 2021-01-29 ENCOUNTER — Other Ambulatory Visit: Payer: Self-pay

## 2021-01-29 DIAGNOSIS — R0609 Other forms of dyspnea: Secondary | ICD-10-CM

## 2021-01-29 DIAGNOSIS — R0602 Shortness of breath: Secondary | ICD-10-CM | POA: Diagnosis not present

## 2021-01-29 DIAGNOSIS — C3411 Malignant neoplasm of upper lobe, right bronchus or lung: Secondary | ICD-10-CM

## 2021-01-29 DIAGNOSIS — R059 Cough, unspecified: Secondary | ICD-10-CM | POA: Diagnosis not present

## 2021-01-29 MED ORDER — IOHEXOL 350 MG/ML SOLN
75.0000 mL | Freq: Once | INTRAVENOUS | Status: AC | PRN
  Administered 2021-01-29: 75 mL via INTRAVENOUS

## 2021-01-29 NOTE — Progress Notes (Signed)
I spoke to patient regarding the results of the CT scan do not show any evidence of blood clots.   Recommend trial of inhalers/patient has couple of inhalers from her PCP. If her shortness of breath does not improve after inhaler's recommend evaluation with pulmonary doctor/Seen lung doctor before.

## 2021-01-29 NOTE — Telephone Encounter (Signed)
Im working on it

## 2021-02-07 ENCOUNTER — Encounter: Payer: Self-pay | Admitting: Internal Medicine

## 2021-02-07 ENCOUNTER — Ambulatory Visit (INDEPENDENT_AMBULATORY_CARE_PROVIDER_SITE_OTHER): Payer: Medicare Other | Admitting: Internal Medicine

## 2021-02-07 ENCOUNTER — Other Ambulatory Visit: Payer: Self-pay

## 2021-02-07 VITALS — BP 158/100 | HR 82 | Ht 64.0 in | Wt 306.0 lb

## 2021-02-07 DIAGNOSIS — I10 Essential (primary) hypertension: Secondary | ICD-10-CM

## 2021-02-07 DIAGNOSIS — C3411 Malignant neoplasm of upper lobe, right bronchus or lung: Secondary | ICD-10-CM

## 2021-02-07 DIAGNOSIS — E118 Type 2 diabetes mellitus with unspecified complications: Secondary | ICD-10-CM

## 2021-02-07 LAB — POCT GLYCOSYLATED HEMOGLOBIN (HGB A1C): Hemoglobin A1C: 7.1 % — AB (ref 4.0–5.6)

## 2021-02-07 NOTE — Progress Notes (Signed)
Date:  02/07/2021   Name:  Megan Shields   DOB:  1945-09-25   MRN:  [id number]   Chief Complaint: Diabetes  Diabetes She presents for her follow-up diabetic visit. She has type 2 diabetes mellitus. Her disease course has been improving. Pertinent negatives for hypoglycemia include no dizziness, headaches or nervousness/anxiousness. Pertinent negatives for diabetes include no blurred vision, no chest pain, no fatigue, no polydipsia, no polyphagia, no visual change, no weakness and no weight loss. Symptoms are stable. Current diabetic treatment includes oral agent (monotherapy). She is compliant with treatment all of the time. Her weight is stable. Her breakfast blood glucose is taken between 7-8 am. Her breakfast blood glucose range is generally 130-140 mg/dl. An ACE inhibitor/angiotensin II receptor blocker is being taken.  Hypertension This is a chronic problem. The current episode started more than 1 year ago. The problem has been waxing and waning since onset. The problem is uncontrolled (better at home - 145/80). Pertinent negatives include no blurred vision, chest pain, headaches, palpitations or shortness of breath.   Lab Results  Component Value Date   NA 133 (L) 01/24/2021   K 4.3 01/24/2021   CO2 31 01/24/2021   GLUCOSE 159 (H) 01/24/2021   BUN 24 (H) 01/24/2021   CREATININE 1.10 (H) 01/24/2021   CALCIUM 9.3 01/24/2021   GFRNONAA 53 (L) 01/24/2021   Lab Results  Component Value Date   CHOL 194 10/09/2020   HDL 78 10/09/2020   LDLCALC 100 (H) 10/09/2020   TRIG 93 10/09/2020   CHOLHDL 2.5 10/09/2020   Lab Results  Component Value Date   TSH 1.680 10/06/2019   Lab Results  Component Value Date   HGBA1C 7.1 (H) 10/09/2020   Lab Results  Component Value Date   WBC 3.5 (L) 01/24/2021   HGB 13.9 01/24/2021   HCT 43.8 01/24/2021   MCV 85.0 01/24/2021   PLT 156 01/24/2021   Lab Results  Component Value Date   ALT 10 01/24/2021   AST 18 01/24/2021   ALKPHOS 51  01/24/2021   BILITOT 0.7 01/24/2021   Lab Results  Component Value Date   VD25OH 34.9 03/07/2020     Review of Systems  Constitutional:  Positive for unexpected weight change (due to cancer treatment). Negative for fatigue and weight loss.  HENT:  Negative for nosebleeds.   Eyes:  Negative for blurred vision and visual disturbance.  Respiratory:  Negative for cough, chest tightness, shortness of breath and wheezing.   Cardiovascular:  Negative for chest pain, palpitations and leg swelling.  Gastrointestinal:  Negative for abdominal pain, constipation and diarrhea.  Endocrine: Negative for polydipsia and polyphagia.  Neurological:  Negative for dizziness, weakness, light-headedness and headaches.  Psychiatric/Behavioral:  Negative for dysphoric mood and sleep disturbance. The patient is not nervous/anxious.    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   Vitamin D deficiency 10/06/2019   Rosacea 04/08/2019   Hyperlipidemia associated with type 2 diabetes mellitus ([place]) 04/08/2019   Vertigo 04/07/2018   Tubular adenoma of colon 03/10/2017   Tinea pedis of left foot 11/22/2016   Type II diabetes mellitus with complication ([place]) 95/62/1308   Essential (primary) hypertension 08/01/2014   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   Arthritis of knee, degenerative 08/01/2014   Low back strain 08/01/2014   Status post bariatric surgery 08/01/2014  Allergies  Allergen Reactions   Pneumococcal Vaccines Hives   Wound Dressing Adhesive Rash    Paper tape is okay. Tears off skin. blisters   Amoxicillin Rash   Codeine     Other reaction(s): drowsiness   Penicillins Rash    Past Surgical History:  Procedure Laterality Date   BLEPHAROPLASTY Bilateral 12/19/2017   CARPAL TUNNEL RELEASE Bilateral 1985    CHOLECYSTECTOMY     COLONOSCOPY WITH PROPOFOL N/A 03/07/2017   Procedure: COLONOSCOPY WITH PROPOFOL;  Surgeon: [name], MD;  Location: [facility] ENDOSCOPY;  Service: Gastroenterology;  Laterality: N/A;   DILATION AND CURETTAGE OF UTERUS  12/2011   benign endometrial polyp   HALLUX VALGUS AUSTIN Right 09/02/2014   Procedure: [place];  Surgeon: Samara Deist, DPM;  Location: [facility] ORS;  Service: Podiatry;  Laterality: Right;   HERNIA REPAIR     JOINT REPLACEMENT Bilateral    TKR   LAPAROSCOPIC GASTRIC BANDING  2010   Lap Band   LAPAROSCOPIC TOTAL HYSTERECTOMY  12/23/2019   lymph node removed Right    TOTAL KNEE ARTHROPLASTY Bilateral 2003, 2004   TOTAL KNEE ARTHROPLASTY Bilateral    VENTRAL HERNIA REPAIR  2008    Social History   Tobacco Use   Smoking status: Never   Smokeless tobacco: Never  Vaping Use   Vaping Use: Never used  Substance Use Topics   Alcohol use: No    Alcohol/week: 0.0 standard drinks   Drug use: No     Medication list has been reviewed and updated.  Current Meds  Medication Sig   acetaminophen (TYLENOL) 500 MG tablet Take 1,000 mg by mouth at bedtime as needed for moderate pain.   amLODipine (NORVASC) 5 MG tablet Take 1 tablet (5 mg total) by mouth daily.   celecoxib (CELEBREX) 200 MG capsule TAKE 1 CAPSULE DAILY   cholecalciferol (VITAMIN D3) 25 MCG (1000 UNIT) tablet Take 1,000 Units by mouth daily.   folic acid (FOLVITE) 1 MG tablet Take 1 tablet (1 mg total) by mouth daily.   FREESTYLE LITE test strip TEST BLOOD SUGAR TWICE A DAY   glipiZIDE (GLIPIZIDE XL) 2.5 MG 24 hr tablet Take 1 tablet (2.5 mg total) by mouth daily with breakfast.   Lancets (FREESTYLE) lancets TEST BLOOD SUGAR TWICE A DAY   meclizine (ANTIVERT) 25 MG tablet 1 pill at night as needed; if dizziness not improved can take one pill every 8 hours as needed/tolerated.   olmesartan-hydrochlorothiazide (BENICAR HCT) 40-25 MG tablet TAKE 1 TABLET DAILY   ondansetron (ZOFRAN) 8 MG  tablet One pill every 8 hours as needed for nausea/vomitting.   osimertinib mesylate (TAGRISSO) 80 MG tablet Take 1 tablet (80 mg total) by mouth daily.   prochlorperazine (COMPAZINE) 10 MG tablet Take 1 tablet (10 mg total) by mouth every 6 (six) hours as needed for nausea or vomiting.    PHQ 2/9 Scores 02/07/2021 10/09/2020 04/26/2020 04/14/2020  PHQ - 2 Score 0 0 0 0  PHQ- 9 Score 6 4 8 4     GAD 7 : Generalized Anxiety Score 02/07/2021 10/09/2020 04/26/2020 04/14/2020  Nervous, Anxious, on Edge 0 0 0 0  Control/stop worrying 0 0 0 0  Worry too much - different things 1 0 0 0  Trouble relaxing 0 0 0 0  Restless 0 0 0 0  Easily annoyed or irritable 0 0 0 0  Afraid - awful might happen 0 0 0 0  Total GAD 7 Score 1 0 0 0  Anxiety  Difficulty Not difficult at all - - -    BP Readings from Last 3 Encounters:  02/07/21 (!) 158/100  01/24/21 (!) 170/86  12/12/20 (!) 165/83    Physical Exam Vitals and nursing note reviewed.  Constitutional:      General: She is not in acute distress.    Appearance: Normal appearance. She is well-developed. She is obese.  HENT:     Head: Normocephalic and atraumatic.  Neck:     Vascular: No carotid bruit.  Cardiovascular:     Rate and Rhythm: Normal rate and regular rhythm.     Pulses: Normal pulses.     Heart sounds: No murmur heard. Pulmonary:     Effort: Pulmonary effort is normal. No respiratory distress.     Breath sounds: No wheezing or rhonchi.  Musculoskeletal:     Cervical back: Normal range of motion.     Right lower leg: No edema.     Left lower leg: No edema.  Lymphadenopathy:     Cervical: Cervical adenopathy present.  Skin:    General: Skin is warm and dry.     Capillary Refill: Capillary refill takes less than 2 seconds.     Findings: No rash.  Neurological:     General: No focal deficit present.     Mental Status: She is alert and oriented to person, place, and time.  Psychiatric:        Mood and Affect: Mood normal.         Behavior: Behavior normal.    Wt Readings from Last 3 Encounters:  02/07/21 (!) 306 lb (138.8 kg)  01/24/21 (!) 307 lb 6.4 oz (139.4 kg)  12/12/20 (!) 304 lb (137.9 kg)    BP (!) 158/100    Pulse 82    Ht 5\' 4"  (1.626 m)    Wt (!) 306 lb (138.8 kg)    SpO2 98%    BMI 52.52 kg/m   Assessment and Plan: 1. Type II diabetes mellitus with complication (HCC) Clinically stable by exam and report without s/s of hypoglycemia. DM complicated by hypertension and dyslipidemia. Tolerating medications well without side effects or other concerns.  - Microalbumin / creatinine urine ratio - unable to obtain specimen - POCT glycosylated hemoglobin (Hb A1C) = 7.1   2. Essential (primary) hypertension BP remains elevated despite triple therapy. Continue to monitor at home with goal of 140/90 or less Bring cuff to next appointment in 4 months  3. Primary cancer of right upper lobe of lung [facility]) Currently under treatment with Oncology for non curable disease On Tagrisso for maintenance indefinitely   Partially dictated using Editor, commissioning. Any errors are unintentional.  Halina Maidens, MD [facility]  02/07/2021

## 2021-03-02 ENCOUNTER — Other Ambulatory Visit: Payer: Self-pay

## 2021-03-02 DIAGNOSIS — C3411 Malignant neoplasm of upper lobe, right bronchus or lung: Secondary | ICD-10-CM

## 2021-03-02 MED ORDER — OSIMERTINIB MESYLATE 80 MG PO TABS: 80.0000 mg | ORAL_TABLET | Freq: Every day | ORAL | 6 refills | Status: DC

## 2021-03-02 NOTE — Progress Notes (Signed)
Paper refill request received from [facility] and was faxed back to [facility] at [phone]

## 2021-03-07 ENCOUNTER — Inpatient Hospital Stay: Payer: Medicare Other | Admitting: Internal Medicine

## 2021-03-07 ENCOUNTER — Inpatient Hospital Stay: Payer: Medicare Other

## 2021-03-09 ENCOUNTER — Encounter: Payer: Self-pay | Admitting: Internal Medicine

## 2021-03-13 ENCOUNTER — Encounter: Payer: Self-pay | Admitting: Internal Medicine

## 2021-03-13 ENCOUNTER — Inpatient Hospital Stay: Payer: Medicare Other | Attending: Internal Medicine

## 2021-03-13 ENCOUNTER — Other Ambulatory Visit: Payer: Self-pay

## 2021-03-13 ENCOUNTER — Inpatient Hospital Stay (HOSPITAL_BASED_OUTPATIENT_CLINIC_OR_DEPARTMENT_OTHER): Payer: Medicare Other | Admitting: Internal Medicine

## 2021-03-13 ENCOUNTER — Ambulatory Visit
Admission: RE | Admit: 2021-03-13 | Discharge: 2021-03-13 | Disposition: A | Discharge status: Home / Self Care | Payer: Medicare Other | Source: Ambulatory Visit | Attending: Internal Medicine | Admitting: Internal Medicine

## 2021-03-13 VITALS — BP 147/77 | HR 96 | Temp 99.1°F | Ht 64.0 in | Wt 302.2 lb

## 2021-03-13 DIAGNOSIS — R0602 Shortness of breath: Secondary | ICD-10-CM | POA: Insufficient documentation

## 2021-03-13 DIAGNOSIS — R2243 Localized swelling, mass and lump, lower limb, bilateral: Secondary | ICD-10-CM | POA: Insufficient documentation

## 2021-03-13 DIAGNOSIS — C3411 Malignant neoplasm of upper lobe, right bronchus or lung: Secondary | ICD-10-CM

## 2021-03-13 DIAGNOSIS — Z79899 Other long term (current) drug therapy: Secondary | ICD-10-CM | POA: Insufficient documentation

## 2021-03-13 DIAGNOSIS — L03032 Cellulitis of left toe: Secondary | ICD-10-CM | POA: Insufficient documentation

## 2021-03-13 DIAGNOSIS — R600 Localized edema: Secondary | ICD-10-CM | POA: Diagnosis not present

## 2021-03-13 DIAGNOSIS — Z7984 Long term (current) use of oral hypoglycemic drugs: Secondary | ICD-10-CM | POA: Insufficient documentation

## 2021-03-13 DIAGNOSIS — M79605 Pain in left leg: Secondary | ICD-10-CM | POA: Diagnosis not present

## 2021-03-13 DIAGNOSIS — I10 Essential (primary) hypertension: Secondary | ICD-10-CM | POA: Insufficient documentation

## 2021-03-13 DIAGNOSIS — Z9221 Personal history of antineoplastic chemotherapy: Secondary | ICD-10-CM | POA: Insufficient documentation

## 2021-03-13 DIAGNOSIS — R059 Cough, unspecified: Secondary | ICD-10-CM | POA: Diagnosis not present

## 2021-03-13 DIAGNOSIS — R252 Cramp and spasm: Secondary | ICD-10-CM | POA: Insufficient documentation

## 2021-03-13 DIAGNOSIS — Z8616 Personal history of COVID-19: Secondary | ICD-10-CM | POA: Insufficient documentation

## 2021-03-13 DIAGNOSIS — E119 Type 2 diabetes mellitus without complications: Secondary | ICD-10-CM | POA: Diagnosis not present

## 2021-03-13 DIAGNOSIS — Z791 Long term (current) use of non-steroidal anti-inflammatories (NSAID): Secondary | ICD-10-CM | POA: Insufficient documentation

## 2021-03-13 DIAGNOSIS — M79604 Pain in right leg: Secondary | ICD-10-CM | POA: Diagnosis not present

## 2021-03-13 LAB — COMPREHENSIVE METABOLIC PANEL
ALT: 10 U/L (ref 0–44)
AST: 19 U/L (ref 15–41)
Albumin: 3.4 g/dL — ABNORMAL LOW (ref 3.5–5.0)
Alkaline Phosphatase: 55 U/L (ref 38–126)
Anion gap: 7 (ref 5–15)
BUN: 23 mg/dL (ref 8–23)
CO2: 29 mmol/L (ref 22–32)
Calcium: 9.3 mg/dL (ref 8.9–10.3)
Chloride: 99 mmol/L (ref 98–111)
Creatinine, Ser: 1.39 mg/dL — ABNORMAL HIGH (ref 0.44–1.00)
GFR, Estimated: 40 mL/min — ABNORMAL LOW (ref >60)
Glucose, Bld: 162 mg/dL — ABNORMAL HIGH (ref 70–99)
Potassium: 4 mmol/L (ref 3.5–5.1)
Sodium: 135 mmol/L (ref 135–145)
Total Bilirubin: 0.4 mg/dL (ref 0.3–1.2)
Total Protein: 7.5 g/dL (ref 6.5–8.1)

## 2021-03-13 LAB — CBC WITH DIFFERENTIAL/PLATELET
Abs Immature Granulocytes: 0.04 10*3/uL (ref 0.00–0.07)
Basophils Absolute: 0 10*3/uL (ref 0.0–0.1)
Basophils Relative: 1 %
Eosinophils Absolute: 0.2 10*3/uL (ref 0.0–0.5)
Eosinophils Relative: 4 %
HCT: 43.2 % (ref 36.0–46.0)
Hemoglobin: 13.9 g/dL (ref 12.0–15.0)
Immature Granulocytes: 1 %
Lymphocytes Relative: 20 %
Lymphs Abs: 0.8 10*3/uL (ref 0.7–4.0)
MCH: 26.8 pg (ref 26.0–34.0)
MCHC: 32.2 g/dL (ref 30.0–36.0)
MCV: 83.2 fL (ref 80.0–100.0)
Monocytes Absolute: 0.6 10*3/uL (ref 0.1–1.0)
Monocytes Relative: 14 %
Neutro Abs: 2.4 10*3/uL (ref 1.7–7.7)
Neutrophils Relative %: 60 %
Platelets: 177 10*3/uL (ref 150–400)
RBC: 5.19 MIL/uL — ABNORMAL HIGH (ref 3.87–5.11)
RDW: 14.1 % (ref 11.5–15.5)
WBC: 4 10*3/uL (ref 4.0–10.5)
nRBC: 0 % (ref 0.0–0.2)

## 2021-03-13 LAB — MAGNESIUM: Magnesium: 1.5 mg/dL — ABNORMAL LOW (ref 1.7–2.4)

## 2021-03-13 MED ORDER — SLOW MAGNESIUM/CALCIUM 70-117 MG PO TBEC: 1.0000 | DELAYED_RELEASE_TABLET | Freq: Two times a day (BID) | ORAL | 6 refills | Status: DC

## 2021-03-13 NOTE — Progress Notes (Signed)
I spoke to patient regarding results of the ultrasound negative for blood clot. Recommend magnesium as discussed. Its not important to call us back.

## 2021-03-13 NOTE — Progress Notes (Signed)
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  [Dr.Byrnett & Dr.Fleming]; May 2022-MRI brain-NEG.   # MAY 11th, 2022- carbo-alimta x1 cycle; discontinued; June 6th, 2022-osimertinib 80 mg a day  #Thyroid -enlargement; recommend biopsy per radiology; patient /husband has been reluctant  # Incidental-out of place Gastric band [since 2014] gastric band.  Asymptomatic monitor for now  # NGS/MOLECULAR TESTS:POSITIVE for EGFR mutation deletion 19    # PALLIATIVE CARE EVALUATION:  # PAIN MANAGEMENT:    DIAGNOSIS: Lung cancer  STAGE:   IIIC      ;  GOALS:Palliatve  CURRENT/MOST RECENT THERAPY : Osiemertinib      Primary cancer of right upper lobe of lung ([place])  06/30/2020 Initial Diagnosis   Primary cancer of right upper lobe of lung ([place])   06/30/2020 Cancer Staging   Staging form: Lung, AJCC 8th Edition - Clinical: Stage IIIC (cT4, cN3, cM0) - Signed by Cammie Sickle, MD on 06/30/2020 Histopathologic type: Adenocarcinoma, NOS    07/05/2020 -  Chemotherapy    Patient is on Treatment Plan: LUNG CARBOPLATIN / PEMETREXED / PEMBROLIZUMAB Q21D INDUCTION X 4 CYCLES / MAINTENANCE PEMETREXED + PEMBROLIZUMAB          HISTORY OF PRESENTING ILLNESS:  She is accompanied by husband.  Ambulating independently.   Megan Shields 76 y.o.  female  Stage IV/adenocarcinoma the lung EGFR mutated-currently on osimertinib is here for  follow-up/review results of CT scan.  Patient continues to complain of shortness of breath especially on exertion.  Mild cough.  Not any worse.  Complains of bilateral cramping in the legs.  Especially nighttime.  Continues to have left-sided- UE/LE-nail changes.   Currently doing Epson salt bath. No nausea no vomiting . No chest pain.  No diarrhea.  Chronic joint pains.  Review of Systems  Constitutional:  Positive for malaise/fatigue. Negative for chills, diaphoresis, fever and weight loss.  HENT:  Negative for nosebleeds and sore throat.   Eyes:  Negative for double vision.  Respiratory:  Negative for hemoptysis, sputum production and wheezing.   Cardiovascular:  Negative for chest pain, palpitations, orthopnea and leg swelling.  Gastrointestinal:  Negative for abdominal pain, blood in stool, constipation, diarrhea, heartburn, melena, nausea and vomiting.  Genitourinary:  Negative for dysuria, frequency and urgency.  Musculoskeletal:  Negative for back pain and joint pain.  Neurological:  Negative for dizziness, tingling, focal weakness, weakness and headaches.  Endo/Heme/Allergies:  Does not bruise/bleed easily.  Psychiatric/Behavioral:  Negative for depression. The patient is not nervous/anxious and does not have insomnia.     MEDICAL HISTORY:  Past Medical History:  Diagnosis Date   Anemia    vitamin d deficiency   Arthritis    Chronic cough    Depression    Diabetes mellitus without complication ([place])    Dyspnea    Hypertension    ILD (interstitial lung disease) ([place])    Obesity    Obesity    BMI 49.44   Pneumonia    Pneumonia due to 2019 novel coronavirus 2022   community acquired also. covid  positive May 2022   PONV (postoperative nausea and vomiting)    Primary cancer of right upper lobe of lung ([place]) 06/30/2020   Rosacea    Shoulder contusion 11/22/2015    SURGICAL HISTORY: Past Surgical History:  Procedure Laterality Date   BLEPHAROPLASTY Bilateral  12/19/2017   CARPAL TUNNEL RELEASE Bilateral 1985   CHOLECYSTECTOMY     COLONOSCOPY WITH PROPOFOL N/A 03/07/2017   Procedure: COLONOSCOPY WITH PROPOFOL;  Surgeon: [name], MD;  Location: [facility] ENDOSCOPY;  Service: Gastroenterology;  Laterality: N/A;   DILATION AND CURETTAGE OF UTERUS  12/2011   benign endometrial polyp   HALLUX VALGUS AUSTIN Right 09/02/2014   Procedure: [place];  Surgeon: Samara Deist, DPM;  Location: [facility] ORS;  Service: Podiatry;  Laterality: Right;   HERNIA REPAIR     JOINT REPLACEMENT Bilateral    TKR   LAPAROSCOPIC GASTRIC BANDING  2010   Lap Band   LAPAROSCOPIC TOTAL HYSTERECTOMY  12/23/2019   lymph node removed Right    TOTAL KNEE ARTHROPLASTY Bilateral 2003, 2004   TOTAL KNEE ARTHROPLASTY Bilateral    VENTRAL HERNIA REPAIR  2008    SOCIAL HISTORY: Social History   Socioeconomic History   Marital status: Married    Spouse name: Fritz Pickerel   Number of children: Not on file   Years of education: Not on file   Highest education level: Not on file  Occupational History   Occupation: Probation officer    Comment: RETIRED  Tobacco Use   Smoking status: Never   Smokeless tobacco: Never  Vaping Use   Vaping Use: Never used  Substance and Sexual Activity   Alcohol use: No    Alcohol/week: 0.0 standard drinks   Drug use: No   Sexual activity: Not Currently  Other Topics Concern   Not on file  Social History Narrative   Never smoked; no alcohol. Lives in [place] with husband . Home maker.    Daughter lives next door. 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   Social Determinants of Health   Financial Resource Strain: Not on file  Food Insecurity: Not on file  Transportation Needs: Not on file  Physical Activity: Not on file  Stress: Not on file  Social Connections: Not on file  Intimate Partner Violence: Not on file    FAMILY HISTORY: Family History  Problem Relation Age of Onset   Breast cancer Mother 41   Breast cancer Paternal  Aunt        3 pat aunts   Liver disease Brother        died 45    ALLERGIES:  is allergic to pneumococcal vaccines, wound dressing adhesive, amoxicillin, codeine, and penicillins.  MEDICATIONS:  Current Outpatient Medications  Medication Sig Dispense Refill   acetaminophen (TYLENOL) 500 MG tablet Take 1,000 mg by mouth at bedtime as needed for moderate pain.     amLODipine (NORVASC) 5 MG tablet Take 1 tablet (5 mg total) by mouth daily. 90 tablet 1   celecoxib (CELEBREX) 200 MG capsule TAKE 1 CAPSULE DAILY 90 capsule 3   cholecalciferol (VITAMIN D3) 25 MCG (1000 UNIT) tablet Take 1,000 Units by mouth daily.     folic acid (FOLVITE) 1 MG tablet Take 1 tablet (1 mg total) by mouth daily. 90 tablet 0   FREESTYLE LITE test strip TEST BLOOD SUGAR TWICE A DAY 100 strip 6   glipiZIDE (GLIPIZIDE XL) 2.5 MG 24 hr tablet Take 1 tablet (2.5 mg total) by mouth daily with  breakfast. 90 tablet 1   glycopyrrolate (ROBINUL) 1 MG tablet      Lancets (FREESTYLE) lancets TEST BLOOD SUGAR TWICE A DAY 100 each 6   Magnesium Cl-Calcium Carbonate (SLOW MAGNESIUM/CALCIUM) 70-117 MG TBEC Take 1 tablet by mouth 2 (two) times daily. 60 tablet 6   meclizine (ANTIVERT) 25 MG tablet 1 pill at night as needed; if dizziness not improved can take one pill every 8 hours as needed/tolerated. 30 tablet 0   olmesartan-hydrochlorothiazide (BENICAR HCT) 40-25 MG tablet TAKE 1 TABLET DAILY 90 tablet 1   ondansetron (ZOFRAN) 8 MG tablet One pill every 8 hours as needed for nausea/vomitting. 40 tablet 1   osimertinib mesylate (TAGRISSO) 80 MG tablet Take 1 tablet (80 mg total) by mouth daily. 30 tablet 6   prochlorperazine (COMPAZINE) 10 MG tablet Take 1 tablet (10 mg total) by mouth every 6 (six) hours as needed for nausea or vomiting. 40 tablet 1   No current facility-administered medications for this visit.    Mild to moderate erythema of the left big toe/nail bed.  Nail intact  PHYSICAL EXAMINATION: ECOG PERFORMANCE  STATUS: 0 - Asymptomatic  Vitals:   03/13/21 1308  BP: (!) 147/77  Pulse: 96  Temp: 99.1 F (37.3 C)  SpO2: 93%   Filed Weights   03/13/21 1308  Weight: (!) 302 lb 3.2 oz (137.1 kg)    Physical Exam Constitutional:      Comments: Obese.    HENT:     Head: Normocephalic and atraumatic.     Mouth/Throat:     Pharynx: No oropharyngeal exudate.  Eyes:     Pupils: Pupils are equal, round, and reactive to light.  Cardiovascular:     Rate and Rhythm: Normal rate and regular rhythm.  Pulmonary:     Effort: No respiratory distress.     Breath sounds: No wheezing.     Comments: Clear to auscultation bilaterally. Abdominal:     General: Bowel sounds are normal. There is no distension.     Palpations: Abdomen is soft. There is no mass.     Tenderness: There is no abdominal tenderness. There is no guarding or rebound.  Musculoskeletal:        General: No tenderness. Normal range of motion.     Cervical back: Normal range of motion and neck supple.  Skin:    General: Skin is warm.  Neurological:     Mental Status: She is alert and oriented to person, place, and time.  Psychiatric:        Mood and Affect: Affect normal.   Erythema of the ball of the left big toe.  No discharge noted.  LABORATORY DATA:  I have reviewed the data as listed Lab Results  Component Value Date   WBC 4.0 03/13/2021   HGB 13.9 03/13/2021   HCT 43.2 03/13/2021   MCV 83.2 03/13/2021   PLT 177 03/13/2021   Recent Labs    09/19/20 0941 10/17/20 1001 12/12/20 1048 01/24/21 0915 03/13/21 1257  NA 136   < > 135 133* 135  K 4.2   < > 4.4 4.3 4.0  CL 100   < > 96* 99 99  CO2 31   < > _0 GLUCOSE 130*   < > 116* 159* 162*  BUN 20   < > 24* 24* 23  CREATININE 1.09*   < > 1.20* 1.10* 1.39*  CALCIUM 9.3   < > 9.4 9.3 9.3  GFRNONAA 54*   < >  48* 53* 40*  PROT 7.5   < > 8.1 7.8 7.5  ALBUMIN 3.2*   < > 3.6 3.6 3.4*  AST 18   < > _0 ALT 11   < > _1 ALKPHOS 44   < > 51 51 55   BILITOT 0.6   < > 0.4 0.7 0.4  BILIDIR 0.1  --   --   --   --    < > = values in this interval not displayed.    RADIOGRAPHIC STUDIES: I have personally reviewed the radiological images as listed and agreed with the findings in the report. No results found.   ASSESSMENT & PLAN:   Primary cancer of right upper lobe of lung ([place]) #Right upper lobe lung adenocarcinoma-STAGE IV  [T4N3M1]-EGFR mutated- Exon 19 del. On. Osimertinib 80 mg once a day [07/31/2020]. DEC 2022-[[compared to September 2022]CT scan-stable/improved right upper lobe lung mass; no evidence of any lymphangitic spread; subcentimeter adenopathy-however see below regarding mild worsening difficulty breathing.   #Continue osimertinib 80 mg a day; no overt progression of disease.  #Mild continue difficulty breathing-given the absence of progressive disease-I would recommend evaluation with pulmonary [Dr.Fleming] for consideration of reactive airway disease.    # bil LE cramps-unclear etiology.  Magnesium 1.5 recommend Slow-Mag; also recommend stat Dopplers bilateral extremities. Check CK levels.   #Paronychia left big toe/ Nail changesr-s/p Epson salt soaking/antibacterial wash; if not improved would recommend antibiotics- STABLE.   #Incidental large thyroid goiter: [Bx recommended].-We will repeat ultrasound in 6 months.  #Chronic joint pains-recommend using Celebrex every other day [see below]; Tylenol as needed for pain.   Stable  #Diabetes /type II -PBF- BG- 116-continue glipizide to 2.5 mg XL-  # DISPOSITION:  # STAT LE venous dopplers/[place] pt pref  # add CK to labs today.  # follow up in 6 weeks- MD; labs- cbc/cmp/mag; -Dr.B  # I reviewed the blood work- with the patient in detail; also reviewed the imaging independently [as summarized above]; and with the patient in detail.       All questions were answered. The patient knows to call the clinic with any problems, questions or concerns.    Cammie Sickle, MD 03/13/2021 1:51 PM

## 2021-03-13 NOTE — Assessment & Plan Note (Addendum)
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 On. Osimertinib 80 mg once a day [07/31/2020]. DEC 2022-[[compared to September 2022]CT scan-stable/improved right upper lobe lung mass; no evidence of any lymphangitic spread; subcentimeter adenopathy-however see below regarding mild worsening difficulty breathing.   #Continue osimertinib 80 mg a day; no overt progression of disease.  #Mild continue difficulty breathing-given the absence of progressive disease-I would recommend evaluation with pulmonary [Dr.Fleming] for consideration of reactive airway disease.    # bil LE cramps-unclear etiology.  Magnesium 1.5 recommend Slow-Mag; also recommend stat Dopplers bilateral extremities. Check CK levels.   #Paronychia left big toe/ Nail changesr-s/p Epson salt soaking/antibacterial wash; if not improved would recommend antibiotics- STABLE.   #Incidental large thyroid goiter: [Bx recommended].-We will repeat ultrasound in 6 months.  #Chronic joint pains-recommend using Celebrex every other day [see below]; Tylenol as needed for pain.   Stable  #Diabetes /type II -PBF- BG- 116-continue glipizide to 2.5 mg XL-  # DISPOSITION:  # STAT LE venous dopplers/[place] pt pref  # add CK to labs today.  # follow up in 6 weeks- MD; labs- cbc/cmp/mag; -Dr.B  # I reviewed the blood work- with the patient in detail; also reviewed the imaging independently [as summarized above]; and with the patient in detail.

## 2021-03-13 NOTE — Progress Notes (Signed)
Pt states she is having to use her O2 more frequently.  Having leg/feet cramps.

## 2021-03-13 NOTE — Progress Notes (Signed)
I spoke to patient regarding results of the ultrasound negative for Blood clot.  Recommend magnesium is recommended. Follow up as Recommended.

## 2021-03-15 ENCOUNTER — Other Ambulatory Visit: Payer: Self-pay

## 2021-03-15 DIAGNOSIS — C3411 Malignant neoplasm of upper lobe, right bronchus or lung: Secondary | ICD-10-CM

## 2021-03-16 ENCOUNTER — Other Ambulatory Visit: Payer: Self-pay | Admitting: *Deleted

## 2021-03-16 DIAGNOSIS — C3411 Malignant neoplasm of upper lobe, right bronchus or lung: Secondary | ICD-10-CM

## 2021-03-16 MED ORDER — OSIMERTINIB MESYLATE 80 MG PO TABS
80.0000 mg | ORAL_TABLET | Freq: Every day | ORAL | 1 refills | Status: DC
Start: 2021-03-16 — End: 2021-08-30

## 2021-03-30 DIAGNOSIS — R0609 Other forms of dyspnea: Secondary | ICD-10-CM | POA: Diagnosis not present

## 2021-03-30 DIAGNOSIS — C3410 Malignant neoplasm of upper lobe, unspecified bronchus or lung: Secondary | ICD-10-CM | POA: Diagnosis not present

## 2021-03-30 DIAGNOSIS — R0902 Hypoxemia: Secondary | ICD-10-CM | POA: Diagnosis not present

## 2021-03-30 DIAGNOSIS — R053 Chronic cough: Secondary | ICD-10-CM | POA: Diagnosis not present

## 2021-04-20 ENCOUNTER — Other Ambulatory Visit (HOSPITAL_COMMUNITY): Payer: Self-pay

## 2021-04-24 ENCOUNTER — Inpatient Hospital Stay (HOSPITAL_BASED_OUTPATIENT_CLINIC_OR_DEPARTMENT_OTHER): Payer: Medicare Other | Admitting: Internal Medicine

## 2021-04-24 ENCOUNTER — Other Ambulatory Visit (HOSPITAL_COMMUNITY): Payer: Self-pay

## 2021-04-24 ENCOUNTER — Other Ambulatory Visit: Payer: Self-pay

## 2021-04-24 ENCOUNTER — Inpatient Hospital Stay: Payer: Medicare Other | Attending: Internal Medicine

## 2021-04-24 DIAGNOSIS — Z79899 Other long term (current) drug therapy: Secondary | ICD-10-CM | POA: Diagnosis not present

## 2021-04-24 DIAGNOSIS — Z9221 Personal history of antineoplastic chemotherapy: Secondary | ICD-10-CM | POA: Diagnosis not present

## 2021-04-24 DIAGNOSIS — C3411 Malignant neoplasm of upper lobe, right bronchus or lung: Secondary | ICD-10-CM

## 2021-04-24 DIAGNOSIS — Z7984 Long term (current) use of oral hypoglycemic drugs: Secondary | ICD-10-CM | POA: Diagnosis not present

## 2021-04-24 DIAGNOSIS — R0600 Dyspnea, unspecified: Secondary | ICD-10-CM | POA: Insufficient documentation

## 2021-04-24 DIAGNOSIS — M2550 Pain in unspecified joint: Secondary | ICD-10-CM | POA: Diagnosis not present

## 2021-04-24 DIAGNOSIS — G8929 Other chronic pain: Secondary | ICD-10-CM | POA: Insufficient documentation

## 2021-04-24 DIAGNOSIS — N183 Chronic kidney disease, stage 3 unspecified: Secondary | ICD-10-CM | POA: Diagnosis not present

## 2021-04-24 DIAGNOSIS — E119 Type 2 diabetes mellitus without complications: Secondary | ICD-10-CM | POA: Insufficient documentation

## 2021-04-24 LAB — CBC WITH DIFFERENTIAL/PLATELET
Abs Immature Granulocytes: 0.01 10*3/uL (ref 0.00–0.07)
Basophils Absolute: 0 10*3/uL (ref 0.0–0.1)
Basophils Relative: 0 %
Eosinophils Absolute: 0.2 10*3/uL (ref 0.0–0.5)
Eosinophils Relative: 4 %
HCT: 42.3 % (ref 36.0–46.0)
Hemoglobin: 13.7 g/dL (ref 12.0–15.0)
Immature Granulocytes: 0 %
Lymphocytes Relative: 20 %
Lymphs Abs: 0.9 10*3/uL (ref 0.7–4.0)
MCH: 27.4 pg (ref 26.0–34.0)
MCHC: 32.4 g/dL (ref 30.0–36.0)
MCV: 84.6 fL (ref 80.0–100.0)
Monocytes Absolute: 0.5 10*3/uL (ref 0.1–1.0)
Monocytes Relative: 12 %
Neutro Abs: 3 10*3/uL (ref 1.7–7.7)
Neutrophils Relative %: 64 %
Platelets: 169 10*3/uL (ref 150–400)
RBC: 5 MIL/uL (ref 3.87–5.11)
RDW: 13.9 % (ref 11.5–15.5)
WBC: 4.7 10*3/uL (ref 4.0–10.5)
nRBC: 0 % (ref 0.0–0.2)

## 2021-04-24 LAB — COMPREHENSIVE METABOLIC PANEL
ALT: 11 U/L (ref 0–44)
AST: 17 U/L (ref 15–41)
Albumin: 3.3 g/dL — ABNORMAL LOW (ref 3.5–5.0)
Alkaline Phosphatase: 58 U/L (ref 38–126)
Anion gap: 7 (ref 5–15)
BUN: 24 mg/dL — ABNORMAL HIGH (ref 8–23)
CO2: 31 mmol/L (ref 22–32)
Calcium: 9.3 mg/dL (ref 8.9–10.3)
Chloride: 97 mmol/L — ABNORMAL LOW (ref 98–111)
Creatinine, Ser: 1.2 mg/dL — ABNORMAL HIGH (ref 0.44–1.00)
GFR, Estimated: 48 mL/min — ABNORMAL LOW (ref >60)
Glucose, Bld: 145 mg/dL — ABNORMAL HIGH (ref 70–99)
Potassium: 3.8 mmol/L (ref 3.5–5.1)
Sodium: 135 mmol/L (ref 135–145)
Total Bilirubin: <0.1 mg/dL — ABNORMAL LOW (ref 0.3–1.2)
Total Protein: 7.4 g/dL (ref 6.5–8.1)

## 2021-04-24 LAB — MAGNESIUM: Magnesium: 1.8 mg/dL (ref 1.7–2.4)

## 2021-04-24 LAB — CK: Total CK: 78 U/L (ref 38–234)

## 2021-04-24 NOTE — Assessment & Plan Note (Addendum)
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 On. Osimertinib 80 mg once a day [07/31/2020]. DEC 5th.  2022-[[compared to September 2022]CT scan-stable/improved right upper lobe lung mass; no evidence of any lymphangitic spread; subcentimeter adenopathy-however see below regarding mild worsening difficulty breathing. STABLE.   # Continue osimertinib 80 mg a day; no overt progression of disease; will repeat imaging again in 6 weeks.  # Mild dyspnea--given the absence of progressive disease-s/p evaluation with pulmonary [Dr.Fleming] for on proair prn. IMPROVED.   # CKD stage III monitor closely. STABLE.   # bil LE cramps-continue Slow-Mag; JAN 2023-  Dopplers bilateral extremities- WNL- STABLE.    #Paronychia left big toe/ Nail changesr-s/p Epson salt soaking/antibacterial wash- STABLE.   #Incidental large thyroid goiter: [Bx recommended].-We will repeat ultrasound in 6 months.  #Chronic joint pains-recommend using Celebrex every other day [see below]; Tylenol as needed for pain.   Stable  #Diabetes /type II -PBF- BG- 116-continue glipizide to 2.5 mg XL-  Stable  # DISPOSITION:   # follow up in 6 weeks- MD; labs- cbc/cmp/mag;CT chest prior  -Dr.B

## 2021-04-24 NOTE — Progress Notes (Signed)
Patient denies new problems/concerns today.   °

## 2021-04-24 NOTE — Progress Notes (Signed)
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  [Dr.Byrnett & Dr.Fleming]; May 2022-MRI brain-NEG.   # MAY 11th, 2022- carbo-alimta x1 cycle; discontinued; June 6th, 2022-osimertinib 80 mg a day  #Thyroid -enlargement; recommend biopsy per radiology; patient /husband has been reluctant  # Incidental-out of place Gastric band [since 2014] gastric band.  Asymptomatic monitor for now  # NGS/MOLECULAR TESTS:POSITIVE for EGFR mutation deletion 19    # PALLIATIVE CARE EVALUATION:  # PAIN MANAGEMENT:    DIAGNOSIS: Lung cancer  STAGE:   IIIC      ;  GOALS:Palliatve  CURRENT/MOST RECENT THERAPY : Osiemertinib      Primary cancer of right upper lobe of lung ([place])  06/30/2020 Initial Diagnosis   Primary cancer of right upper lobe of lung ([place])   06/30/2020 Cancer Staging   Staging form: Lung, AJCC 8th Edition - Clinical: Stage IIIC (cT4, cN3, cM0) - Signed by Cammie Sickle, MD on 06/30/2020 Histopathologic type: Adenocarcinoma, NOS    07/05/2020 -  Chemotherapy    Patient is on Treatment Plan: LUNG CARBOPLATIN / PEMETREXED / PEMBROLIZUMAB Q21D INDUCTION X 4 CYCLES / MAINTENANCE PEMETREXED + PEMBROLIZUMAB        HISTORY OF PRESENTING ILLNESS:  She is accompanied by husband.  Ambulating independently.   Adine Heimann 76 y.o.  female  Stage IV/adenocarcinoma the lung EGFR mutated-currently on osimertinib is here for  follow-up/  Patient shortness of breath improved.  Patient is currently using ProAir status post evaluation with pulmonology.  No worsening cough.  Continues to have left-sided- UE/LE-nail changes; Currently doing Epson salt bath. No nausea no vomiting . No chest pain.  No diarrhea.  Chronic joint pains. Admits to compliance with her medications.  Review of Systems  Constitutional:  Positive for malaise/fatigue. Negative for chills, diaphoresis, fever and weight loss.  HENT:  Negative for nosebleeds and sore throat.   Eyes:  Negative for double vision.  Respiratory:  Negative for hemoptysis, sputum production and wheezing.   Cardiovascular:  Negative for chest pain, palpitations, orthopnea and leg swelling.  Gastrointestinal:  Negative for abdominal pain, blood in stool, constipation, diarrhea, heartburn, melena, nausea and vomiting.  Genitourinary:  Negative for dysuria, frequency and urgency.  Musculoskeletal:  Negative for back pain and joint pain.  Neurological:  Negative for dizziness, tingling, focal weakness, weakness and headaches.  Endo/Heme/Allergies:  Does not bruise/bleed easily.  Psychiatric/Behavioral:  Negative for depression. The patient is not nervous/anxious and does not have insomnia.     MEDICAL HISTORY:  Past Medical History:  Diagnosis Date   Anemia    vitamin d deficiency   Arthritis    Chronic cough    Depression    Diabetes mellitus without complication ([place])    Dyspnea    Hypertension    ILD (interstitial lung disease) ([place])    Obesity    Obesity    BMI 49.44   Pneumonia    Pneumonia due to 2019 novel coronavirus 2022   community acquired also. covid positive May 2022   PONV (postoperative nausea and vomiting)  Primary cancer of right upper lobe of lung ([place]) 06/30/2020   Rosacea    Shoulder contusion 11/22/2015    SURGICAL HISTORY: Past Surgical History:  Procedure Laterality Date   BLEPHAROPLASTY Bilateral 12/19/2017   CARPAL TUNNEL RELEASE  Bilateral 1985   CHOLECYSTECTOMY     COLONOSCOPY WITH PROPOFOL N/A 03/07/2017   Procedure: COLONOSCOPY WITH PROPOFOL;  Surgeon: [name], MD;  Location: [facility] ENDOSCOPY;  Service: Gastroenterology;  Laterality: N/A;   DILATION AND CURETTAGE OF UTERUS  12/2011   benign endometrial polyp   HALLUX VALGUS AUSTIN Right 09/02/2014   Procedure: [place];  Surgeon: Samara Deist, DPM;  Location: [facility] ORS;  Service: Podiatry;  Laterality: Right;   HERNIA REPAIR     JOINT REPLACEMENT Bilateral    TKR   LAPAROSCOPIC GASTRIC BANDING  2010   Lap Band   LAPAROSCOPIC TOTAL HYSTERECTOMY  12/23/2019   lymph node removed Right    TOTAL KNEE ARTHROPLASTY Bilateral 2003, 2004   TOTAL KNEE ARTHROPLASTY Bilateral    VENTRAL HERNIA REPAIR  2008    SOCIAL HISTORY: Social History   Socioeconomic History   Marital status: Married    Spouse name: Fritz Pickerel   Number of children: Not on file   Years of education: Not on file   Highest education level: Not on file  Occupational History   Occupation: Probation officer    Comment: RETIRED  Tobacco Use   Smoking status: Never   Smokeless tobacco: Never  Vaping Use   Vaping Use: Never used  Substance and Sexual Activity   Alcohol use: No    Alcohol/week: 0.0 standard drinks   Drug use: No   Sexual activity: Not Currently  Other Topics Concern   Not on file  Social History Narrative   Never smoked; no alcohol. Lives in [place] with husband . Home maker.    Daughter lives next door. 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   Social Determinants of Health   Financial Resource Strain: Not on file  Food Insecurity: Not on file  Transportation Needs: Not on file  Physical Activity: Not on file  Stress: Not on file  Social Connections: Not on file  Intimate Partner Violence: Not on file    FAMILY HISTORY: Family History  Problem Relation Age of Onset   Breast cancer Mother 60   Breast cancer Paternal Aunt        3 pat aunts   Liver  disease Brother        died 57    ALLERGIES:  is allergic to pneumococcal vaccines, wound dressing adhesive, amoxicillin, codeine, and penicillins.  MEDICATIONS:  Current Outpatient Medications  Medication Sig Dispense Refill   acetaminophen (TYLENOL) 500 MG tablet Take 1,000 mg by mouth at bedtime as needed for moderate pain.     amLODipine (NORVASC) 5 MG tablet Take 1 tablet (5 mg total) by mouth daily. 90 tablet 1   celecoxib (CELEBREX) 200 MG capsule TAKE 1 CAPSULE DAILY 90 capsule 3   cholecalciferol (VITAMIN D3) 25 MCG (1000 UNIT) tablet Take 1,000 Units by mouth daily.     FREESTYLE LITE test strip TEST BLOOD SUGAR TWICE A DAY 100 strip 6   glipiZIDE (GLIPIZIDE XL) 2.5 MG 24 hr tablet Take 1 tablet (2.5 mg total) by mouth daily with breakfast. 90 tablet 1   Lancets (FREESTYLE) lancets TEST BLOOD SUGAR TWICE A DAY 100 each 6   Magnesium Cl-Calcium Carbonate (SLOW MAGNESIUM/CALCIUM) 70-117 MG TBEC Take 1 tablet by mouth  2 (two) times daily. 60 tablet 6   meclizine (ANTIVERT) 25 MG tablet 1 pill at night as needed; if dizziness not improved can take one pill every 8 hours as needed/tolerated. 30 tablet 0   olmesartan-hydrochlorothiazide (BENICAR HCT) 40-25 MG tablet TAKE 1 TABLET DAILY 90 tablet 1   ondansetron (ZOFRAN) 8 MG tablet One pill every 8 hours as needed for nausea/vomitting. 40 tablet 1   osimertinib mesylate (TAGRISSO) 80 MG tablet Take 1 tablet (80 mg total) by mouth daily. 90 tablet 1   prochlorperazine (COMPAZINE) 10 MG tablet Take 1 tablet (10 mg total) by mouth every 6 (six) hours as needed for nausea or vomiting. 40 tablet 1   folic acid (FOLVITE) 1 MG tablet Take 1 tablet (1 mg total) by mouth daily. (Patient not taking: Reported on 04/24/2021) 90 tablet 0   glycopyrrolate (ROBINUL) 1 MG tablet  (Patient not taking: Reported on 04/24/2021)     No current facility-administered medications for this visit.    Mild to moderate erythema of the left big toe/nail bed.  Nail  intact  PHYSICAL EXAMINATION: ECOG PERFORMANCE STATUS: 0 - Asymptomatic  There were no vitals filed for this visit.  There were no vitals filed for this visit.   Physical Exam Constitutional:      Comments: Obese.    HENT:     Head: Normocephalic and atraumatic.     Mouth/Throat:     Pharynx: No oropharyngeal exudate.  Eyes:     Pupils: Pupils are equal, round, and reactive to light.  Cardiovascular:     Rate and Rhythm: Normal rate and regular rhythm.  Pulmonary:     Effort: No respiratory distress.     Breath sounds: No wheezing.     Comments: Clear to auscultation bilaterally. Abdominal:     General: Bowel sounds are normal. There is no distension.     Palpations: Abdomen is soft. There is no mass.     Tenderness: There is no abdominal tenderness. There is no guarding or rebound.  Musculoskeletal:        General: No tenderness. Normal range of motion.     Cervical back: Normal range of motion and neck supple.  Skin:    General: Skin is warm.  Neurological:     Mental Status: She is alert and oriented to person, place, and time.  Psychiatric:        Mood and Affect: Affect normal.   Erythema of the ball of the left big toe.  No discharge noted.  LABORATORY DATA:  I have reviewed the data as listed Lab Results  Component Value Date   WBC 4.7 04/24/2021   HGB 13.7 04/24/2021   HCT 42.3 04/24/2021   MCV 84.6 04/24/2021   PLT 169 04/24/2021   Recent Labs    09/19/20 0941 10/17/20 1001 01/24/21 0915 03/13/21 1257 04/24/21 1415  NA 136   < > 133* 135 135  K 4.2   < > 4.3 4.0 3.8  CL 100   < > 99 99 97*  CO2 31   < > '31 29 31  ' GLUCOSE 130*   < > 159* 162* 145*  BUN 20   < > 24* 23 24*  CREATININE 1.09*   < > 1.10* 1.39* 1.20*  CALCIUM 9.3   < > 9.3 9.3 9.3  GFRNONAA 54*   < > 53* 40* 48*  PROT 7.5   < > 7.8 7.5 7.4  ALBUMIN 3.2*   < > 3.6  3.4* 3.3*  AST 18   < > '18 19 17  ' ALT 11   < > '10 10 11  ' ALKPHOS 44   < > 51 55 58  BILITOT 0.6   < > 0.7 0.4  <0.1*  BILIDIR 0.1  --   --   --   --    < > = values in this interval not displayed.    RADIOGRAPHIC STUDIES: I have personally reviewed the radiological images as listed and agreed with the findings in the report. No results found.   ASSESSMENT & PLAN:   Primary cancer of right upper lobe of lung ([place]) #Right upper lobe lung adenocarcinoma-STAGE IV  [T4N3M1]-EGFR mutated- Exon 19 del. On. Osimertinib 80 mg once a day [07/31/2020]. DEC 5th.  2022-[[compared to September 2022]CT scan-stable/improved right upper lobe lung mass; no evidence of any lymphangitic spread; subcentimeter adenopathy-however see below regarding mild worsening difficulty breathing. STABLE.   # Continue osimertinib 80 mg a day; no overt progression of disease; will repeat imaging again in 6 weeks.  # Mild dyspnea--given the absence of progressive disease-s/p evaluation with pulmonary [Dr.Fleming] for on proair prn. IMPROVED.   # CKD stage III monitor closely. STABLE.   # bil LE cramps-continue Slow-Mag; JAN 2023-  Dopplers bilateral extremities- WNL- STABLE.    #Paronychia left big toe/ Nail changesr-s/p Epson salt soaking/antibacterial wash- STABLE.   #Incidental large thyroid goiter: [Bx recommended].-We will repeat ultrasound in 6 months.  #Chronic joint pains-recommend using Celebrex every other day [see below]; Tylenol as needed for pain.   Stable  #Diabetes /type II -PBF- BG- 116-continue glipizide to 2.5 mg XL-  Stable  # DISPOSITION:   # follow up in 6 weeks- MD; labs- cbc/cmp/mag;CT chest prior  -Dr.B       All questions were answered. The patient knows to call the clinic with any problems, questions or concerns.    Cammie Sickle, MD 04/24/2021 3:16 PM

## 2021-04-26 ENCOUNTER — Other Ambulatory Visit: Payer: Self-pay | Admitting: Internal Medicine

## 2021-05-29 ENCOUNTER — Other Ambulatory Visit: Payer: Self-pay

## 2021-05-29 ENCOUNTER — Ambulatory Visit
Admission: RE | Admit: 2021-05-29 | Discharge: 2021-05-29 | Disposition: A | Discharge status: Home / Self Care | Payer: Medicare Other | Source: Ambulatory Visit | Attending: Internal Medicine | Admitting: Internal Medicine

## 2021-05-29 ENCOUNTER — Inpatient Hospital Stay: Payer: Medicare Other | Attending: Internal Medicine

## 2021-05-29 DIAGNOSIS — E1122 Type 2 diabetes mellitus with diabetic chronic kidney disease: Secondary | ICD-10-CM | POA: Diagnosis not present

## 2021-05-29 DIAGNOSIS — Z85118 Personal history of other malignant neoplasm of bronchus and lung: Secondary | ICD-10-CM | POA: Diagnosis not present

## 2021-05-29 DIAGNOSIS — J8410 Pulmonary fibrosis, unspecified: Secondary | ICD-10-CM | POA: Diagnosis not present

## 2021-05-29 DIAGNOSIS — Z7984 Long term (current) use of oral hypoglycemic drugs: Secondary | ICD-10-CM | POA: Insufficient documentation

## 2021-05-29 DIAGNOSIS — N183 Chronic kidney disease, stage 3 unspecified: Secondary | ICD-10-CM | POA: Insufficient documentation

## 2021-05-29 DIAGNOSIS — Z79899 Other long term (current) drug therapy: Secondary | ICD-10-CM | POA: Insufficient documentation

## 2021-05-29 DIAGNOSIS — L03032 Cellulitis of left toe: Secondary | ICD-10-CM | POA: Insufficient documentation

## 2021-05-29 DIAGNOSIS — J479 Bronchiectasis, uncomplicated: Secondary | ICD-10-CM | POA: Diagnosis not present

## 2021-05-29 DIAGNOSIS — R001 Bradycardia, unspecified: Secondary | ICD-10-CM | POA: Diagnosis not present

## 2021-05-29 DIAGNOSIS — Z9221 Personal history of antineoplastic chemotherapy: Secondary | ICD-10-CM | POA: Insufficient documentation

## 2021-05-29 DIAGNOSIS — C3411 Malignant neoplasm of upper lobe, right bronchus or lung: Secondary | ICD-10-CM | POA: Insufficient documentation

## 2021-05-29 DIAGNOSIS — I2510 Atherosclerotic heart disease of native coronary artery without angina pectoris: Secondary | ICD-10-CM | POA: Diagnosis not present

## 2021-05-29 LAB — COMPREHENSIVE METABOLIC PANEL
ALT: 11 U/L (ref 0–44)
AST: 19 U/L (ref 15–41)
Albumin: 3.5 g/dL (ref 3.5–5.0)
Alkaline Phosphatase: 52 U/L (ref 38–126)
Anion gap: 6 (ref 5–15)
BUN: 21 mg/dL (ref 8–23)
CO2: 30 mmol/L (ref 22–32)
Calcium: 9.5 mg/dL (ref 8.9–10.3)
Chloride: 97 mmol/L — ABNORMAL LOW (ref 98–111)
Creatinine, Ser: 1.03 mg/dL — ABNORMAL HIGH (ref 0.44–1.00)
GFR, Estimated: 57 mL/min — ABNORMAL LOW (ref >60)
Glucose, Bld: 168 mg/dL — ABNORMAL HIGH (ref 70–99)
Potassium: 4 mmol/L (ref 3.5–5.1)
Sodium: 133 mmol/L — ABNORMAL LOW (ref 135–145)
Total Bilirubin: 0.8 mg/dL (ref 0.3–1.2)
Total Protein: 7.7 g/dL (ref 6.5–8.1)

## 2021-05-29 LAB — CBC WITH DIFFERENTIAL/PLATELET
Abs Immature Granulocytes: 0.01 10*3/uL (ref 0.00–0.07)
Basophils Absolute: 0 10*3/uL (ref 0.0–0.1)
Basophils Relative: 1 %
Eosinophils Absolute: 0.2 10*3/uL (ref 0.0–0.5)
Eosinophils Relative: 5 %
HCT: 45 % (ref 36.0–46.0)
Hemoglobin: 14.3 g/dL (ref 12.0–15.0)
Immature Granulocytes: 0 %
Lymphocytes Relative: 20 %
Lymphs Abs: 0.8 10*3/uL (ref 0.7–4.0)
MCH: 27.1 pg (ref 26.0–34.0)
MCHC: 31.8 g/dL (ref 30.0–36.0)
MCV: 85.4 fL (ref 80.0–100.0)
Monocytes Absolute: 0.5 10*3/uL (ref 0.1–1.0)
Monocytes Relative: 13 %
Neutro Abs: 2.4 10*3/uL (ref 1.7–7.7)
Neutrophils Relative %: 61 %
Platelets: 171 10*3/uL (ref 150–400)
RBC: 5.27 MIL/uL — ABNORMAL HIGH (ref 3.87–5.11)
RDW: 14.1 % (ref 11.5–15.5)
WBC: 3.9 10*3/uL — ABNORMAL LOW (ref 4.0–10.5)
nRBC: 0 % (ref 0.0–0.2)

## 2021-05-29 LAB — MAGNESIUM: Magnesium: 1.7 mg/dL (ref 1.7–2.4)

## 2021-05-29 MED ORDER — IOHEXOL 350 MG/ML SOLN
75.0000 mL | Freq: Once | INTRAVENOUS | Status: AC | PRN
  Administered 2021-05-29: 75 mL via INTRAVENOUS

## 2021-06-05 ENCOUNTER — Encounter: Payer: Self-pay | Admitting: Internal Medicine

## 2021-06-05 ENCOUNTER — Other Ambulatory Visit: Payer: TRICARE For Life (TFL)

## 2021-06-05 ENCOUNTER — Inpatient Hospital Stay (HOSPITAL_BASED_OUTPATIENT_CLINIC_OR_DEPARTMENT_OTHER): Payer: Medicare Other | Admitting: Internal Medicine

## 2021-06-05 DIAGNOSIS — L03032 Cellulitis of left toe: Secondary | ICD-10-CM | POA: Diagnosis not present

## 2021-06-05 DIAGNOSIS — C3411 Malignant neoplasm of upper lobe, right bronchus or lung: Secondary | ICD-10-CM

## 2021-06-05 DIAGNOSIS — E1122 Type 2 diabetes mellitus with diabetic chronic kidney disease: Secondary | ICD-10-CM | POA: Diagnosis not present

## 2021-06-05 DIAGNOSIS — R001 Bradycardia, unspecified: Secondary | ICD-10-CM | POA: Diagnosis not present

## 2021-06-05 DIAGNOSIS — Z9221 Personal history of antineoplastic chemotherapy: Secondary | ICD-10-CM | POA: Diagnosis not present

## 2021-06-05 DIAGNOSIS — N183 Chronic kidney disease, stage 3 unspecified: Secondary | ICD-10-CM | POA: Diagnosis not present

## 2021-06-05 NOTE — Progress Notes (Signed)
Pt would like to know if she can get a B12 injection today? C/o being tired. ? ?Has been feeling more tired since starting tagrisso. Pulse today is 41, and is having some SOB today. ?

## 2021-06-05 NOTE — Assessment & Plan Note (Addendum)
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 On. Osimertinib 80 mg once a day [07/31/2020].  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 ?# Continue osimertinib 80 mg a day; no overt progression of disease; clinicaly stable; see below.. ? ?# Fatigue: Question related to osimertinib.  Discussed regarding giving holding the therapy for couple of weeks/decreasing the dose-however patient very nervous.  She wants to continue current osimertinib dose. ? ?# Bradycardia- HR 41- not on BB blockers;  EKG-heart rate 71; no evidence of A-fib/sinus rhythm.  Monitor closely. ? ?# Mild dyspnea--given the absence of progressive disease-s/p evaluation with pulmonary [Dr.Fleming] for on proair prn. IMPROVED.  ? ?# CKD stage III monitor closely. STABLE.  ? ?# bil LE cramps-continue Slow-Mag; JAN 2023-  Dopplers bilateral extremities- WNL- STABLE.   ? ?#Paronychia left big toe/ Nail changesr-s/p Epson salt soaking/antibacterial wash- STABLE.  ? ?#Incidental large thyroid goiter: [Bx recommended].-We will repeat ultrasound in 6 months. ? ?#Chronic joint pains-recommend using Celebrex every other day [see below]; Tylenol as needed for pain.   Stable ? ?#Diabetes /type II -PBF- BG- 168-continue glipizide to 2.5 mg XL-  Stable ? ?#Incidental findings on Imaging  April , 2023: Atherosclerosis; hepatic steatosis I reviewed/discussed/counseled the patient.  ? ?# DISPOSITION:   ?# follow up in 6 weeks- MD; labs- cbc/cmp/mag; -Dr.B ? ?# I reviewed the blood work- with the patient in detail; also reviewed the imaging independently [as summarized above]; and with the patient in detail.  ? ? ?

## 2021-06-05 NOTE — Progress Notes (Signed)
[employer] ?CONSULT NOTE ? ?Patient Care Team: ?Glean Hess, MD as PCP - General (Internal Medicine) ?[facility] (Ophthalmology) ?Ree Edman, MD (Dermatology) ?Telford Nab, RN as Sales executive ?Cammie Sickle, MD as Consulting Physician (Oncology) ?Leandrew Koyanagi, MD as Referring Physician (Ophthalmology) ? ?CHIEF COMPLAINTS/PURPOSE OF CONSULTATION: lung cancer ? 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  [Dr.Byrnett & Dr.Fleming]; May 2022-MRI brain-NEG.  ? ?# MAY 11th, 2022- carbo-alimta x1 cycle; discontinued; June 6th, 2022-osimertinib 80 mg a day ? ?#Thyroid -enlargement; recommend biopsy per radiology; patient /husband has been reluctant ? ?# Incidental-out of place Gastric band [since 0277] gastric band.  Asymptomatic monitor for now ? ?# NGS/MOLECULAR TESTS:POSITIVE for EGFR mutation deletion 19 ? ? ? ?# PALLIATIVE CARE EVALUATION: ? ?# PAIN MANAGEMENT:  ? ? ?DIAGNOSIS: Lung cancer ? ?STAGE:   IIIC      ;  GOALS:Palliatve ? ?CURRENT/MOST RECENT THERAPY : Osiemertinib ? ? ? ?  ?Primary cancer of right upper lobe of lung ([place])  ?06/30/2020 Initial Diagnosis  ? Primary cancer of right upper lobe of lung ([place]) ?  ?06/30/2020 Cancer Staging  ? Staging form: Lung, AJCC 8th Edition ?- Clinical: Stage IIIC (cT4, cN3, cM0) - Signed by Cammie Sickle, MD on 06/30/2020 ?Histopathologic type: Adenocarcinoma, NOS ? ?  ?07/05/2020 -  Chemotherapy  ?  Patient is on Treatment Plan: LUNG CARBOPLATIN / PEMETREXED / PEMBROLIZUMAB Q21D INDUCTION X 4 CYCLES / MAINTENANCE PEMETREXED + PEMBROLIZUMAB ? ?  ? ?  ? ?HISTORY OF PRESENTING ILLNESS:  She is accompanied by husband.  Ambulating independently.  ? ?Megan Shields 76 y.o.  female  Stage IV/adenocarcinoma the lung EGFR mutated-currently on osimertinib is here for  follow-up/review results of the CT scan. ? ?Patient complains of ongoing fatigue.  Otherwise denies any worsening shortness of breath or cough. ? ?. No nausea no vomiting . No chest pain.  No diarrhea.  Chronic joint pains. Admits to compliance with her Tagrisso. ? ?Review of Systems  ?Constitutional:  Positive for malaise/fatigue. Negative for chills, diaphoresis, fever and weight loss.  ?HENT:  Negative for nosebleeds and sore throat.   ?Eyes:  Negative for double vision.  ?Respiratory:  Negative for hemoptysis, sputum production and wheezing.   ?Cardiovascular:  Negative for chest pain, palpitations, orthopnea and leg swelling.  ?Gastrointestinal:  Negative for abdominal pain, blood in stool, constipation, diarrhea, heartburn, melena, nausea and vomiting.  ?Genitourinary:  Negative for dysuria, frequency and urgency.  ?Musculoskeletal:  Negative for back pain and joint pain.  ?Neurological:  Negative for dizziness, tingling, focal weakness, weakness and headaches.  ?Endo/Heme/Allergies:  Does not bruise/bleed easily.  ?Psychiatric/Behavioral:  Negative for depression. The patient is not nervous/anxious and does not have insomnia.    ? ?MEDICAL HISTORY:  ?Past Medical History:  ?Diagnosis Date  ? Anemia   ? vitamin d deficiency  ? Arthritis   ? Chronic cough   ? Depression   ? Diabetes mellitus without complication ([place])   ? Dyspnea   ? Hypertension   ? ILD (interstitial lung disease) ([place])   ? Obesity   ? Obesity   ? BMI 49.44  ? Pneumonia   ? Pneumonia due to 2019 novel coronavirus 2022  ? community acquired also. covid positive May 2022  ? PONV (postoperative nausea and vomiting)   ? Primary cancer of right upper lobe of lung (  [place]) 06/30/2020  ? Rosacea   ? Shoulder contusion 11/22/2015  ? ? ?SURGICAL HISTORY: ?Past Surgical History:  ?Procedure Laterality Date  ? BLEPHAROPLASTY Bilateral 12/19/2017  ? CARPAL TUNNEL RELEASE Bilateral 1985  ? CHOLECYSTECTOMY    ? COLONOSCOPY WITH PROPOFOL N/A 03/07/2017  ?  Procedure: COLONOSCOPY WITH PROPOFOL;  Surgeon: [name], MD;  Location: [facility] ENDOSCOPY;  Service: Gastroenterology;  Laterality: N/A;  ? DILATION AND CURETTAGE OF UTERUS  12/2011  ? benign endometrial polyp  ? HALLUX VALGUS AUSTIN Right 09/02/2014  ? Procedure: HALLUX VALGUS AUSTIN;  Surgeon: Samara Deist, DPM;  Location: [facility] ORS;  Service: Podiatry;  Laterality: Right;  ? HERNIA REPAIR    ? JOINT REPLACEMENT Bilateral   ? TKR  ? LAPAROSCOPIC GASTRIC BANDING  2010  ? Lap Band  ? LAPAROSCOPIC TOTAL HYSTERECTOMY  12/23/2019  ? lymph node removed Right   ? TOTAL KNEE ARTHROPLASTY Bilateral 2003, 2004  ? TOTAL KNEE ARTHROPLASTY Bilateral   ? VENTRAL HERNIA REPAIR  2008  ? ? ?SOCIAL HISTORY: ?Social History  ? ?Socioeconomic History  ? Marital status: Married  ?  Spouse name: Fritz Pickerel  ? Number of children: Not on file  ? Years of education: Not on file  ? Highest education level: Not on file  ?Occupational History  ? Occupation: Probation officer  ?  Comment: RETIRED  ?Tobacco Use  ? Smoking status: Never  ? Smokeless tobacco: Never  ?Vaping Use  ? Vaping Use: Never used  ?Substance and Sexual Activity  ? Alcohol use: No  ?  Alcohol/week: 0.0 standard drinks  ? Drug use: No  ? Sexual activity: Not Currently  ?Other Topics Concern  ? Not on file  ?Social History Narrative  ? Never smoked; no alcohol. Lives in [place] with husband . Home maker.   ? Daughter lives next door. 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  ? ?Social Determinants of Health  ? ?Financial Resource Strain: Not on file  ?Food Insecurity: Not on file  ?Transportation Needs: Not on file  ?Physical Activity: Not on file  ?Stress: Not on file  ?Social Connections: Not on file  ?Intimate Partner Violence: Not on file  ? ? ?FAMILY HISTORY: ?Family History  ?Problem Relation Age of Onset  ? Breast cancer Mother 52  ? Breast cancer Paternal Aunt   ?     3 pat aunts  ? Liver disease Brother   ?     died 7  ? ? ?ALLERGIES:  is allergic to pneumococcal  vaccines, wound dressing adhesive, amoxicillin, codeine, and penicillins. ? ?MEDICATIONS:  ?Current Outpatient Medications  ?Medication Sig Dispense Refill  ? acetaminophen (TYLENOL) 500 MG tablet Take 1,000 mg by mouth at bedtime as needed for moderate pain.    ? amLODipine (NORVASC) 5 MG tablet Take 1 tablet (5 mg total) by mouth daily. 90 tablet 1  ? celecoxib (CELEBREX) 200 MG capsule TAKE 1 CAPSULE DAILY 90 capsule 3  ? cholecalciferol (VITAMIN D3) 25 MCG (1000 UNIT) tablet Take 1,000 Units by mouth daily.    ? FREESTYLE LITE test strip TEST BLOOD SUGAR TWICE A DAY 100 strip 6  ? GLIPIZIDE XL 2.5 MG 24 hr tablet TAKE 1 TABLET DAILY WITH BREAKFAST 90 tablet 3  ? Lancets (FREESTYLE) lancets TEST BLOOD SUGAR TWICE A DAY 100 each 6  ? Magnesium Cl-Calcium Carbonate (SLOW MAGNESIUM/CALCIUM) 70-117 MG TBEC Take 1 tablet by mouth 2 (two) times daily. 60 tablet 6  ? meclizine (ANTIVERT) 25 MG tablet  1 pill at night as needed; if dizziness not improved can take one pill every 8 hours as needed/tolerated. 30 tablet 0  ? olmesartan-hydrochlorothiazide (BENICAR HCT) 40-25 MG tablet TAKE 1 TABLET DAILY 90 tablet 1  ? ondansetron (ZOFRAN) 8 MG tablet One pill every 8 hours as needed for nausea/vomitting. 40 tablet 1  ? osimertinib mesylate (TAGRISSO) 80 MG tablet Take 1 tablet (80 mg total) by mouth daily. 90 tablet 1  ? prochlorperazine (COMPAZINE) 10 MG tablet Take 1 tablet (10 mg total) by mouth every 6 (six) hours as needed for nausea or vomiting. 40 tablet 1  ? ?No current facility-administered medications for this visit.  ? ? ?Mild to moderate erythema of the left big toe/nail bed.  Nail intact ? ?PHYSICAL EXAMINATION: ?ECOG PERFORMANCE STATUS: 0 - Asymptomatic ? ?Vitals:  ? 06/05/21 0946  ?BP: (!) 153/68  ?Pulse: (!) 41  ?Temp: 97.8 ?F (36.6 ?C)  ?SpO2: 99%  ? ? ?Filed Weights  ? 06/05/21 0946  ?Weight: (!) 306 lb (138.8 kg)  ? ? ? ?Physical Exam ?Constitutional:   ?   Comments: Obese.    ?HENT:  ?   Head:  Normocephalic and atraumatic.  ?   Mouth/Throat:  ?   Pharynx: No oropharyngeal exudate.  ?Eyes:  ?   Pupils: Pupils are equal, round, and reactive to light.  ?Cardiovascular:  ?   Rate and Rhythm: Normal rate and regular rhythm

## 2021-06-08 ENCOUNTER — Encounter: Payer: Self-pay | Admitting: Internal Medicine

## 2021-06-08 ENCOUNTER — Ambulatory Visit (INDEPENDENT_AMBULATORY_CARE_PROVIDER_SITE_OTHER): Payer: Medicare Other | Admitting: Internal Medicine

## 2021-06-08 VITALS — BP 126/70 | HR 89 | Ht 64.0 in | Wt 305.0 lb

## 2021-06-08 DIAGNOSIS — C3411 Malignant neoplasm of upper lobe, right bronchus or lung: Secondary | ICD-10-CM

## 2021-06-08 DIAGNOSIS — I10 Essential (primary) hypertension: Secondary | ICD-10-CM | POA: Diagnosis not present

## 2021-06-08 DIAGNOSIS — Z1231 Encounter for screening mammogram for malignant neoplasm of breast: Secondary | ICD-10-CM | POA: Diagnosis not present

## 2021-06-08 DIAGNOSIS — E118 Type 2 diabetes mellitus with unspecified complications: Secondary | ICD-10-CM

## 2021-06-08 MED ORDER — OLMESARTAN MEDOXOMIL-HCTZ 40-25 MG PO TABS: 1.0000 | ORAL_TABLET | Freq: Every day | ORAL | 1 refills | Status: DC

## 2021-06-08 NOTE — Progress Notes (Signed)
? ? ?Date:  06/08/2021  ? ?Name:  Megan Shields   DOB:  25-Sep-1945   MRN:  [id number] ? ? ?Chief Complaint: Diabetes and Hypertension ? ?Hypertension ?This is a chronic problem. The problem has been waxing and waning (BP has been high for the last several visits) since onset. Pertinent negatives include no chest pain, headaches, palpitations or shortness of breath. Past treatments include angiotensin blockers, diuretics and calcium channel blockers. The current treatment provides moderate improvement. Compliance problems: she feels that the amlodipine makes her very tired.  Hypertensive end-organ damage includes kidney disease. There is no history of CVA.  ?Diabetes ?She presents for her follow-up diabetic visit. She has type 2 diabetes mellitus. Pertinent negatives for hypoglycemia include no headaches, nervousness/anxiousness or tremors. Associated symptoms include fatigue. Pertinent negatives for diabetes include no chest pain, no polydipsia and no polyuria. Diabetic complications include nephropathy. Pertinent negatives for diabetic complications include no CVA or heart disease. Current diabetic treatment includes oral agent (monotherapy) (glipizide).  ?Lung cancer - currently undergoing treatment with Tagrisso.  Last scan showed no progression of the lesion.  She denies shortness of breath or chest pain.  She sleeps well. No snoring or apnea. ? ?Lab Results  ?Component Value Date  ? NA 133 (L) 05/29/2021  ? K 4.0 05/29/2021  ? CO2 30 05/29/2021  ? GLUCOSE 168 (H) 05/29/2021  ? BUN 21 05/29/2021  ? CREATININE 1.03 (H) 05/29/2021  ? CALCIUM 9.5 05/29/2021  ? GFRNONAA 57 (L) 05/29/2021  ? ?Lab Results  ?Component Value Date  ? CHOL 194 10/09/2020  ? HDL 78 10/09/2020  ? LDLCALC 100 (H) 10/09/2020  ? TRIG 93 10/09/2020  ? CHOLHDL 2.5 10/09/2020  ? ?Lab Results  ?Component Value Date  ? TSH 1.680 10/06/2019  ? ?Lab Results  ?Component Value Date  ? HGBA1C 7.1 (A) 02/07/2021  ? ?Lab Results  ?Component Value Date  ? WBC  3.9 (L) 05/29/2021  ? HGB 14.3 05/29/2021  ? HCT 45.0 05/29/2021  ? MCV 85.4 05/29/2021  ? PLT 171 05/29/2021  ? ?Lab Results  ?Component Value Date  ? ALT 11 05/29/2021  ? AST 19 05/29/2021  ? ALKPHOS 52 05/29/2021  ? BILITOT 0.8 05/29/2021  ? ?Lab Results  ?Component Value Date  ? VD25OH 34.9 03/07/2020  ?  ? ?Review of Systems  ?Constitutional:  Positive for fatigue. Negative for appetite change, fever and unexpected weight change.  ?HENT:  Negative for tinnitus and trouble swallowing.   ?Eyes:  Negative for visual disturbance.  ?Respiratory:  Negative for cough, chest tightness and shortness of breath.   ?Cardiovascular:  Negative for chest pain, palpitations and leg swelling.  ?Gastrointestinal:  Negative for abdominal pain.  ?Endocrine: Negative for polydipsia and polyuria.  ?Genitourinary:  Negative for dysuria and hematuria.  ?Musculoskeletal:  Negative for arthralgias.  ?Neurological:  Negative for tremors, numbness and headaches.  ?Psychiatric/Behavioral:  Negative for dysphoric mood and sleep disturbance. The patient is not nervous/anxious.   ? ?Patient Active Problem List  ? Diagnosis Date Noted  ? Drug-induced neutropenia ([place]) 07/17/2020  ? Primary cancer of right upper lobe of lung ([place]) 06/30/2020  ? 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 Vitamin D deficiency 10/06/2019  ? Rosacea 04/08/2019  ? Hyperlipidemia associated with type 2 diabetes mellitus ([place]) 04/08/2019  ? Vertigo 04/07/2018  ? Tubular adenoma of colon 03/10/2017  ? Tinea pedis of left foot 11/22/2016  ? Type II diabetes mellitus with complication ([place]) 95/28/4132  ? Essential (primary) hypertension  08/01/2014  ? 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 Arthritis of knee, degenerative 08/01/2014  ? Low back strain 08/01/2014  ? Status post bariatric surgery 08/01/2014  ? ? ?Allergies  ?Allergen Reactions  ? Pneumococcal Vaccines Hives  ? Wound Dressing Adhesive Rash  ?  Paper tape is okay. Tears off skin. blisters  ?  Amoxicillin Rash  ? Codeine   ?  Other reaction(s): drowsiness  ? Penicillins Rash  ? ? ?Past Surgical History:  ?Procedure Laterality Date  ? BLEPHAROPLASTY Bilateral 12/19/2017  ? CARPAL TUNNEL RELEASE Bilateral 1985  ? CHOLECYSTECTOMY    ? COLONOSCOPY WITH PROPOFOL N/A 03/07/2017  ? Procedure: COLONOSCOPY WITH PROPOFOL;  Surgeon: [name], MD;  Location: [facility] ENDOSCOPY;  Service: Gastroenterology;  Laterality: N/A;  ? DILATION AND CURETTAGE OF UTERUS  12/2011  ? benign endometrial polyp  ? HALLUX VALGUS AUSTIN Right 09/02/2014  ? Procedure: HALLUX VALGUS AUSTIN;  Surgeon: Samara Deist, DPM;  Location: [facility] ORS;  Service: Podiatry;  Laterality: Right;  ? HERNIA REPAIR    ? JOINT REPLACEMENT Bilateral   ? TKR  ? LAPAROSCOPIC GASTRIC BANDING  2010  ? Lap Band  ? LAPAROSCOPIC TOTAL HYSTERECTOMY  12/23/2019  ? lymph node removed Right   ? TOTAL KNEE ARTHROPLASTY Bilateral 2003, 2004  ? TOTAL KNEE ARTHROPLASTY Bilateral   ? VENTRAL HERNIA REPAIR  2008  ? ? ?Social History  ? ?Tobacco Use  ? Smoking status: Never  ? Smokeless tobacco: Never  ?Vaping Use  ? Vaping Use: Never used  ?Substance Use Topics  ? Alcohol use: No  ?  Alcohol/week: 0.0 standard drinks  ? Drug use: No  ? ? ? ?Medication list has been reviewed and updated. ? ?Current Meds  ?Medication Sig  ? acetaminophen (TYLENOL) 500 MG tablet Take 1,000 mg by mouth at bedtime as needed for moderate pain.  ? amLODipine (NORVASC) 5 MG tablet Take 1 tablet (5 mg total) by mouth daily.  ? celecoxib (CELEBREX) 200 MG capsule TAKE 1 CAPSULE DAILY  ? cholecalciferol (VITAMIN D3) 25 MCG (1000 UNIT) tablet Take 1,000 Units by mouth daily.  ? FREESTYLE LITE test strip TEST BLOOD SUGAR TWICE A DAY  ? GLIPIZIDE XL 2.5 MG 24 hr tablet TAKE 1 TABLET DAILY WITH BREAKFAST  ? Lancets (FREESTYLE) lancets TEST BLOOD SUGAR TWICE A DAY  ? Magnesium Cl-Calcium Carbonate (SLOW MAGNESIUM/CALCIUM) 70-117 MG TBEC Take 1 tablet by mouth 2 (two) times daily.  ? meclizine (ANTIVERT) 25 MG  tablet 1 pill at night as needed; if dizziness not improved can take one pill every 8 hours as needed/tolerated.  ? olmesartan-hydrochlorothiazide (BENICAR HCT) 40-25 MG tablet TAKE 1 TABLET DAILY  ? ondansetron (ZOFRAN) 8 MG tablet One pill every 8 hours as needed for nausea/vomitting.  ? osimertinib mesylate (TAGRISSO) 80 MG tablet Take 1 tablet (80 mg total) by mouth daily.  ? prochlorperazine (COMPAZINE) 10 MG tablet Take 1 tablet (10 mg total) by mouth every 6 (six) hours as needed for nausea or vomiting.  ? ? ? ?  06/08/2021  ?  9:35 AM 02/07/2021  ?  9:42 AM 10/09/2020  ? 10:11 AM 04/26/2020  ? 11:18 AM  ?GAD 7 : Generalized Anxiety Score  ?Nervous, Anxious, on Edge 0 0 0 0  ?Control/stop worrying 0 0 0 0  ?Worry too much - different things 0 1 0 0  ?Trouble relaxing 0 0 0 0  ?Restless 0 0 0 0  ?Easily annoyed or irritable 0 0 0 0  ?  Afraid - awful might happen 0 0 0 0  ?Total GAD 7 Score 0 1 0 0  ?Anxiety Difficulty Not difficult at all Not difficult at all    ? ? ? ?  06/08/2021  ?  9:35 AM  ?Depression screen PHQ 2/9  ?Decreased Interest 0  ?Down, Depressed, Hopeless 0  ?PHQ - 2 Score 0  ?Altered sleeping 0  ?Tired, decreased energy 3  ?Change in appetite 2  ?Feeling bad or failure about yourself  0  ?Trouble concentrating 0  ?Moving slowly or fidgety/restless 0  ?Suicidal thoughts 0  ?PHQ-9 Score 5  ?Difficult doing work/chores Not difficult at all  ? ? ?BP Readings from Last 3 Encounters:  ?06/08/21 126/70  ?06/05/21 (!) 153/68  ?03/13/21 (!) 147/77  ? ? ?Physical Exam ?Vitals and nursing note reviewed.  ?Constitutional:   ?   General: She is not in acute distress. ?   Appearance: She is well-developed. She is obese.  ?HENT:  ?   Head: Normocephalic and atraumatic.  ?Neck:  ?   Vascular: No carotid bruit.  ?Cardiovascular:  ?   Rate and Rhythm: Normal rate and regular rhythm.  ?   Pulses: Normal pulses.  ?   Heart sounds: No murmur heard. ?Pulmonary:  ?   Effort: Pulmonary effort is normal. No respiratory  distress.  ?   Breath sounds: No wheezing or rhonchi.  ?Musculoskeletal:  ?   Cervical back: Normal range of motion.  ?   Right lower leg: No edema.  ?   Left lower leg: No edema.  ?Lymphadenopathy:  ?   Cer

## 2021-06-08 NOTE — Patient Instructions (Signed)
Monitor BP at home 2 times per week and record it. ? ?Goal is <135/85 ?

## 2021-07-09 ENCOUNTER — Encounter: Payer: Self-pay | Admitting: Pharmacist

## 2021-07-09 ENCOUNTER — Other Ambulatory Visit (HOSPITAL_COMMUNITY): Payer: Self-pay

## 2021-07-09 NOTE — Telephone Encounter (Signed)
Encounter opened in error

## 2021-07-17 ENCOUNTER — Inpatient Hospital Stay (HOSPITAL_BASED_OUTPATIENT_CLINIC_OR_DEPARTMENT_OTHER): Payer: Medicare Other | Admitting: Internal Medicine

## 2021-07-17 ENCOUNTER — Inpatient Hospital Stay: Payer: Medicare Other | Attending: Internal Medicine

## 2021-07-17 ENCOUNTER — Encounter: Payer: Self-pay | Admitting: Internal Medicine

## 2021-07-17 VITALS — BP 153/80 | HR 70 | Temp 98.5°F | Ht 64.0 in | Wt 306.2 lb

## 2021-07-17 DIAGNOSIS — G8929 Other chronic pain: Secondary | ICD-10-CM | POA: Diagnosis not present

## 2021-07-17 DIAGNOSIS — C3411 Malignant neoplasm of upper lobe, right bronchus or lung: Secondary | ICD-10-CM | POA: Insufficient documentation

## 2021-07-17 DIAGNOSIS — N183 Chronic kidney disease, stage 3 unspecified: Secondary | ICD-10-CM | POA: Diagnosis not present

## 2021-07-17 DIAGNOSIS — Z7984 Long term (current) use of oral hypoglycemic drugs: Secondary | ICD-10-CM | POA: Insufficient documentation

## 2021-07-17 DIAGNOSIS — M2550 Pain in unspecified joint: Secondary | ICD-10-CM | POA: Diagnosis not present

## 2021-07-17 DIAGNOSIS — Z803 Family history of malignant neoplasm of breast: Secondary | ICD-10-CM | POA: Insufficient documentation

## 2021-07-17 DIAGNOSIS — Z79899 Other long term (current) drug therapy: Secondary | ICD-10-CM | POA: Insufficient documentation

## 2021-07-17 DIAGNOSIS — E119 Type 2 diabetes mellitus without complications: Secondary | ICD-10-CM | POA: Insufficient documentation

## 2021-07-17 LAB — CBC WITH DIFFERENTIAL/PLATELET
Abs Immature Granulocytes: 0.07 10*3/uL (ref 0.00–0.07)
Basophils Absolute: 0 10*3/uL (ref 0.0–0.1)
Basophils Relative: 0 %
Eosinophils Absolute: 0.2 10*3/uL (ref 0.0–0.5)
Eosinophils Relative: 4 %
HCT: 42 % (ref 36.0–46.0)
Hemoglobin: 13 g/dL (ref 12.0–15.0)
Immature Granulocytes: 2 %
Lymphocytes Relative: 20 %
Lymphs Abs: 0.9 10*3/uL (ref 0.7–4.0)
MCH: 26.5 pg (ref 26.0–34.0)
MCHC: 31 g/dL (ref 30.0–36.0)
MCV: 85.7 fL (ref 80.0–100.0)
Monocytes Absolute: 0.5 10*3/uL (ref 0.1–1.0)
Monocytes Relative: 11 %
Neutro Abs: 2.8 10*3/uL (ref 1.7–7.7)
Neutrophils Relative %: 63 %
Platelets: 157 10*3/uL (ref 150–400)
RBC: 4.9 MIL/uL (ref 3.87–5.11)
RDW: 13.9 % (ref 11.5–15.5)
WBC: 4.5 10*3/uL (ref 4.0–10.5)
nRBC: 0 % (ref 0.0–0.2)

## 2021-07-17 LAB — COMPREHENSIVE METABOLIC PANEL
ALT: 10 U/L (ref 0–44)
AST: 19 U/L (ref 15–41)
Albumin: 3.3 g/dL — ABNORMAL LOW (ref 3.5–5.0)
Alkaline Phosphatase: 53 U/L (ref 38–126)
Anion gap: 7 (ref 5–15)
BUN: 19 mg/dL (ref 8–23)
CO2: 32 mmol/L (ref 22–32)
Calcium: 8.9 mg/dL (ref 8.9–10.3)
Chloride: 95 mmol/L — ABNORMAL LOW (ref 98–111)
Creatinine, Ser: 1.14 mg/dL — ABNORMAL HIGH (ref 0.44–1.00)
GFR, Estimated: 51 mL/min — ABNORMAL LOW (ref >60)
Glucose, Bld: 213 mg/dL — ABNORMAL HIGH (ref 70–99)
Potassium: 4.1 mmol/L (ref 3.5–5.1)
Sodium: 134 mmol/L — ABNORMAL LOW (ref 135–145)
Total Bilirubin: 0.5 mg/dL (ref 0.3–1.2)
Total Protein: 7.5 g/dL (ref 6.5–8.1)

## 2021-07-17 LAB — MAGNESIUM: Magnesium: 1.7 mg/dL (ref 1.7–2.4)

## 2021-07-17 NOTE — Progress Notes (Signed)
Pt has questions about her lymph nodes.

## 2021-07-17 NOTE — Assessment & Plan Note (Addendum)
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 On. Osimertinib 80 mg once a day [07/31/2020].  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   # Fatigue: Question related to osimertinib.  Discussed regarding giving holding the therapy for couple of weeks/decreasing the dose-however patient very nervous.  She wants to continue current osimertinib dose. ? CARE program.   # CKD stage III monitor closely. STABLE.   #Paronychia left big toe/ Nail changesr-s/p Epson salt soaking/antibacterial wash; recommend evaluation with Dr.Fowler/podiatry.   # Incidental large thyroid goiter: [Bx recommended].-We will repeat ultrasound in 6 months; will order at next viist. .  #Chronic joint pains-continue Celebrex every other day [see below]; Tylenol as needed for pain.  Stable  #Diabetes /type II -PBF- BG- 2.13-continue glipizide to 2.5 mg XL- Stable  # DISPOSITION:   # follow up in 6 weeks- MD; labs- cbc/cmp/mag;CT chest/neck -Dr.B

## 2021-07-17 NOTE — Progress Notes (Signed)
[place] NOTE  Patient Care Team: Glean Hess, MD as PCP - General (Internal Medicine) [facility] (Ophthalmology) Ree Edman, MD (Dermatology) Telford Nab, RN as Oncology Nurse Navigator Cammie Sickle, MD as Consulting Physician (Oncology) Leandrew Koyanagi, MD as Referring Physician (Ophthalmology)  CHIEF COMPLAINTS/PURPOSE OF CONSULTATION: lung cancer  #  Oncology History Overview Note  # April-MAY 2022- 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  [Dr.Byrnett & Dr.Fleming]; May 2022-MRI brain-NEG.   # MAY 11th, 2022- carbo-alimta x1 cycle; discontinued; June 6th, 2022-osimertinib 80 mg a day  #Thyroid -enlargement; recommend biopsy per radiology; patient /husband has been reluctant  # Incidental-out of place Gastric band [since 2014] gastric band.  Asymptomatic monitor for now  # NGS/MOLECULAR TESTS:POSITIVE for EGFR mutation deletion 19    # PALLIATIVE CARE EVALUATION:  # PAIN MANAGEMENT:    DIAGNOSIS: Lung cancer  STAGE:   IIIC      ;  GOALS:Palliatve  CURRENT/MOST RECENT THERAPY : Osiemertinib      Primary cancer of right upper lobe of lung ([place])  06/30/2020 Initial Diagnosis   Primary cancer of right upper lobe of lung ([place])   06/30/2020 Cancer Staging   Staging form: Lung, AJCC 8th Edition - Clinical: Stage IIIC (cT4, cN3, cM0) - Signed by Cammie Sickle, MD on 06/30/2020 Histopathologic type: Adenocarcinoma, NOS    07/05/2020 -  Chemotherapy    Patient is on Treatment Plan: LUNG CARBOPLATIN / PEMETREXED / PEMBROLIZUMAB Q21D INDUCTION X 4 CYCLES / MAINTENANCE PEMETREXED + PEMBROLIZUMAB        HISTORY OF PRESENTING ILLNESS:  She is accompanied by husband.  Ambulating independently.   Megan Shields 76 y.o.  female  Stage IV/adenocarcinoma the lung EGFR mutated-currently on osimertinib is here for  follow-up.   Patient complains of ongoing fatigue.  Otherwise denies any worsening shortness of breath or cough.  Continues to have peeling of her toenails.  No nausea no vomiting . No chest pain.  No diarrhea.  Chronic joint pains. Admits to compliance with her Tagrisso.  Review of Systems  Constitutional:  Positive for malaise/fatigue. Negative for chills, diaphoresis, fever and weight loss.  HENT:  Negative for nosebleeds and sore throat.   Eyes:  Negative for double vision.  Respiratory:  Negative for hemoptysis, sputum production and wheezing.   Cardiovascular:  Negative for chest pain, palpitations, orthopnea and leg swelling.  Gastrointestinal:  Negative for abdominal pain, blood in stool, constipation, diarrhea, heartburn, melena, nausea and vomiting.  Genitourinary:  Negative for dysuria, frequency and urgency.  Musculoskeletal:  Negative for back pain and joint pain.  Neurological:  Negative for dizziness, tingling, focal weakness, weakness and headaches.  Endo/Heme/Allergies:  Does not bruise/bleed easily.  Psychiatric/Behavioral:  Negative for depression. The patient is not nervous/anxious and does not have insomnia.     MEDICAL HISTORY:  Past Medical History:  Diagnosis Date   Anemia    vitamin d deficiency   Arthritis    Chronic cough    Depression    Diabetes mellitus without complication ([place])    Dyspnea    Hypertension    ILD (interstitial lung disease) ([place])    Obesity    Obesity    BMI 49.44   Pneumonia    Pneumonia due to 2019 novel coronavirus 2022   community acquired also. covid positive May 2022   PONV (postoperative nausea and vomiting)    Primary cancer of right upper  lobe of lung ([place]) 06/30/2020   Rosacea    Shoulder contusion 11/22/2015    SURGICAL HISTORY: Past Surgical History:  Procedure Laterality Date   BLEPHAROPLASTY Bilateral 12/19/2017   CARPAL TUNNEL RELEASE Bilateral 1985   CHOLECYSTECTOMY     COLONOSCOPY WITH PROPOFOL N/A 03/07/2017    Procedure: COLONOSCOPY WITH PROPOFOL;  Surgeon: [name], MD;  Location: [facility] END[facility]COPY;  Service: Gastroenterology;  Laterality: N/A;   DILATION AND CURETTAGE OF UTERUS  12/2011   benign endometrial polyp   HALLUX VALGUS AUSTIN Right 09/02/2014   Procedure: [place];  Surgeon: Samara Deist, DPM;  Location: [facility] ORS;  Service: Podiatry;  Laterality: Right;   HERNIA REPAIR     JOINT REPLACEMENT Bilateral    TKR   LAPAROSCOPIC GASTRIC BANDING  2010   Lap Band   LAPAROSCOPIC TOTAL HYSTERECTOMY  12/23/2019   lymph node removed Right    TOTAL KNEE ARTHROPLASTY Bilateral 2003, 2004   TOTAL KNEE ARTHROPLASTY Bilateral    VENTRAL HERNIA REPAIR  2008    SOCIAL HISTORY: Social History   Socioeconomic History   Marital status: Married    Spouse name: Fritz Pickerel   Number of children: Not on file   Years of education: Not on file   Highest education level: Not on file  Occupational History   Occupation: Probation officer    Comment: RETIRED  Tobacco Use   Smoking status: Never   Smokeless tobacco: Never  Vaping Use   Vaping Use: Never used  Substance and Sexual Activity   Alcohol use: No    Alcohol/week: 0.0 standard drinks   Drug use: No   Sexual activity: Not Currently  Other Topics Concern   Not on file  Social History Narrative   Never smoked; no alcohol. Lives in [place] with husband . Home maker.    Daughter lives next door. 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   Social Determinants of Health   Financial Resource Strain: Not on file  Food Insecurity: Not on file  Transportation Needs: Not on file  Physical Activity: Not on file  Stress: Not on file  Social Connections: Not on file  Intimate Partner Violence: Not on file    FAMILY HISTORY: Family History  Problem Relation Age of Onset   Breast cancer Mother 59   Breast cancer Paternal Aunt        3 pat aunts   Liver disease Brother        died 68    ALLERGIES:  is allergic to pneumococcal  vaccines, wound dressing adhesive, amoxicillin, codeine, and penicillins.  MEDICATIONS:  Current Outpatient Medications  Medication Sig Dispense Refill   acetaminophen (TYLENOL) 500 MG tablet Take 1,000 mg by mouth at bedtime as needed for moderate pain.     celecoxib (CELEBREX) 200 MG capsule TAKE 1 CAPSULE DAILY 90 capsule 3   cholecalciferol (VITAMIN D3) 25 MCG (1000 UNIT) tablet Take 1,000 Units by mouth daily.     FREESTYLE LITE test strip TEST BLOOD SUGAR TWICE A DAY 100 strip 6   GLIPIZIDE XL 2.5 MG 24 hr tablet TAKE 1 TABLET DAILY WITH BREAKFAST 90 tablet 3   Lancets (FREESTYLE) lancets TEST BLOOD SUGAR TWICE A DAY 100 each 6   Magnesium Cl-Calcium Carbonate (SLOW MAGNESIUM/CALCIUM) 70-117 MG TBEC Take 1 tablet by mouth 2 (two) times daily. 60 tablet 6   meclizine (ANTIVERT) 25 MG tablet 1 pill at night as needed; if dizziness not improved can take one pill every 8  hours as needed/tolerated. 30 tablet 0   olmesartan-hydrochlorothiazide (BENICAR HCT) 40-25 MG tablet Take 1 tablet by mouth daily. 90 tablet 1   ondansetron (ZOFRAN) 8 MG tablet One pill every 8 hours as needed for nausea/vomitting. 40 tablet 1   osimertinib mesylate (TAGRISSO) 80 MG tablet Take 1 tablet (80 mg total) by mouth daily. 90 tablet 1   prochlorperazine (COMPAZINE) 10 MG tablet Take 1 tablet (10 mg total) by mouth every 6 (six) hours as needed for nausea or vomiting. 40 tablet 1   No current facility-administered medications for this visit.    Mild to moderate erythema of the left big toe/nail bed.  Nail intact  PHYSICAL EXAMINATION: ECOG PERFORMANCE STATUS: 0 - Asymptomatic  Vitals:   07/17/21 0938  BP: (!) 153/80  Pulse: 70  Temp: 98.5 F (36.9 C)  SpO2: 100%    Filed Weights   07/17/21 0938  Weight: (!) 306 lb 3.2 oz (138.9 kg)     Physical Exam Constitutional:      Comments: Obese.    HENT:     Head: Normocephalic and atraumatic.     Mouth/Throat:     Pharynx: No oropharyngeal exudate.   Eyes:     Pupils: Pupils are equal, round, and reactive to light.  Cardiovascular:     Rate and Rhythm: Normal rate and regular rhythm.  Pulmonary:     Effort: No respiratory distress.     Breath sounds: No wheezing.     Comments: Clear to auscultation bilaterally. Abdominal:     General: Bowel sounds are normal. There is no distension.     Palpations: Abdomen is soft. There is no mass.     Tenderness: There is no abdominal tenderness. There is no guarding or rebound.  Musculoskeletal:        General: No tenderness. Normal range of motion.     Cervical back: Normal range of motion and neck supple.  Skin:    General: Skin is warm.  Neurological:     Mental Status: She is alert and oriented to person, place, and time.  Psychiatric:        Mood and Affect: Affect normal.   Erythema of the ball of the left big toe.  No discharge noted.  LABORATORY DATA:  I have reviewed the data as listed Lab Results  Component Value Date   WBC 4.5 07/17/2021   HGB 13.0 07/17/2021   HCT 42.0 07/17/2021   MCV 85.7 07/17/2021   PLT 157 07/17/2021   Recent Labs    09/19/20 0941 10/17/20 1001 04/24/21 1415 05/29/21 0933 07/17/21 0939  NA 136   < > 135 133* 134*  K 4.2   < > 3.8 4.0 4.1  CL 100   < > 97* 97* 95*  CO2 31   < > 31 30 32  GLUCOSE 130*   < > 145* 168* 213*  BUN 20   < > 24* 21 19  CREATININE 1.09*   < > 1.20* 1.03* 1.14*  CALCIUM 9.3   < > 9.3 9.5 8.9  GFRNONAA 54*   < > 48* 57* 51*  PROT 7.5   < > 7.4 7.7 7.5  ALBUMIN 3.2*   < > 3.3* 3.5 3.3*  AST 18   < > '17 19 19  ' ALT 11   < > '11 11 10  ' ALKPHOS 44   < > 58 52 53  BILITOT 0.6   < > <0.1* 0.8 0.5  BILIDIR 0.1  --   --   --   --    < > =  values in this interval not displayed.    RADIOGRAPHIC STUDIES: I have personally reviewed the radiological images as listed and agreed with the findings in the report. No results found.   ASSESSMENT & PLAN:   Primary cancer of right upper lobe of lung ([place]) #Right upper lobe  lung adenocarcinoma-STAGE IV  [T4N3M1]-EGFR mutated- Exon 19 del. On. Osimertinib 80 mg once a day [07/31/2020].  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   # Fatigue: Question related to osimertinib.  Discussed regarding giving holding the therapy for couple of weeks/decreasing the dose-however patient very nervous.  She wants to continue current osimertinib dose. ? CARE program.   # CKD stage III monitor closely. STABLE.   #Paronychia left big toe/ Nail changesr-s/p Epson salt soaking/antibacterial wash; recommend evaluation with Dr.Fowler/podiatry.   # Incidental large thyroid goiter: [Bx recommended].-We will repeat ultrasound in 6 months; will order at next viist. .  #Chronic joint pains-continue Celebrex every other day [see below]; Tylenol as needed for pain.  Stable  #Diabetes /type II -PBF- BG- 2.13-continue glipizide to 2.5 mg XL- Stable  # DISPOSITION:   # follow up in 6 weeks- MD; labs- cbc/cmp/mag;CT chest/neck -Dr.B    All questions were answered. The patient knows to call the clinic with any problems, questions or concerns.    Cammie Sickle, MD 07/17/2021 1:05 PM

## 2021-08-13 DIAGNOSIS — L853 Xerosis cutis: Secondary | ICD-10-CM | POA: Diagnosis not present

## 2021-08-13 DIAGNOSIS — E119 Type 2 diabetes mellitus without complications: Secondary | ICD-10-CM | POA: Diagnosis not present

## 2021-08-13 DIAGNOSIS — R252 Cramp and spasm: Secondary | ICD-10-CM | POA: Diagnosis not present

## 2021-08-13 DIAGNOSIS — L603 Nail dystrophy: Secondary | ICD-10-CM | POA: Diagnosis not present

## 2021-08-27 ENCOUNTER — Ambulatory Visit
Admission: RE | Admit: 2021-08-27 | Discharge: 2021-08-27 | Disposition: A | Discharge status: Home / Self Care | Payer: Medicare Other | Source: Ambulatory Visit | Attending: Internal Medicine | Admitting: Internal Medicine

## 2021-08-27 DIAGNOSIS — J479 Bronchiectasis, uncomplicated: Secondary | ICD-10-CM | POA: Diagnosis not present

## 2021-08-27 DIAGNOSIS — C3411 Malignant neoplasm of upper lobe, right bronchus or lung: Secondary | ICD-10-CM | POA: Diagnosis not present

## 2021-08-27 DIAGNOSIS — C3490 Malignant neoplasm of unspecified part of unspecified bronchus or lung: Secondary | ICD-10-CM | POA: Diagnosis not present

## 2021-08-27 DIAGNOSIS — C770 Secondary and unspecified malignant neoplasm of lymph nodes of head, face and neck: Secondary | ICD-10-CM | POA: Diagnosis not present

## 2021-08-27 DIAGNOSIS — E049 Nontoxic goiter, unspecified: Secondary | ICD-10-CM | POA: Diagnosis not present

## 2021-08-27 DIAGNOSIS — J849 Interstitial pulmonary disease, unspecified: Secondary | ICD-10-CM | POA: Diagnosis not present

## 2021-08-27 DIAGNOSIS — R911 Solitary pulmonary nodule: Secondary | ICD-10-CM | POA: Diagnosis not present

## 2021-08-27 MED ORDER — IOHEXOL 300 MG/ML  SOLN
100.0000 mL | Freq: Once | INTRAMUSCULAR | Status: AC | PRN
  Administered 2021-08-27: 100 mL via INTRAVENOUS

## 2021-08-30 ENCOUNTER — Inpatient Hospital Stay (HOSPITAL_BASED_OUTPATIENT_CLINIC_OR_DEPARTMENT_OTHER): Payer: Medicare Other | Admitting: Internal Medicine

## 2021-08-30 ENCOUNTER — Encounter: Payer: Self-pay | Admitting: Internal Medicine

## 2021-08-30 ENCOUNTER — Inpatient Hospital Stay: Payer: Medicare Other | Attending: Internal Medicine

## 2021-08-30 ENCOUNTER — Other Ambulatory Visit: Payer: Self-pay | Admitting: Internal Medicine

## 2021-08-30 VITALS — BP 135/68 | HR 65 | Temp 97.5°F | Ht 64.0 in | Wt 307.4 lb

## 2021-08-30 DIAGNOSIS — E119 Type 2 diabetes mellitus without complications: Secondary | ICD-10-CM | POA: Diagnosis not present

## 2021-08-30 DIAGNOSIS — Z9221 Personal history of antineoplastic chemotherapy: Secondary | ICD-10-CM | POA: Insufficient documentation

## 2021-08-30 DIAGNOSIS — K148 Other diseases of tongue: Secondary | ICD-10-CM

## 2021-08-30 DIAGNOSIS — C3411 Malignant neoplasm of upper lobe, right bronchus or lung: Secondary | ICD-10-CM | POA: Diagnosis not present

## 2021-08-30 DIAGNOSIS — Z79899 Other long term (current) drug therapy: Secondary | ICD-10-CM | POA: Insufficient documentation

## 2021-08-30 DIAGNOSIS — E041 Nontoxic single thyroid nodule: Secondary | ICD-10-CM

## 2021-08-30 DIAGNOSIS — L03032 Cellulitis of left toe: Secondary | ICD-10-CM | POA: Insufficient documentation

## 2021-08-30 DIAGNOSIS — Z7984 Long term (current) use of oral hypoglycemic drugs: Secondary | ICD-10-CM | POA: Insufficient documentation

## 2021-08-30 DIAGNOSIS — N183 Chronic kidney disease, stage 3 unspecified: Secondary | ICD-10-CM | POA: Insufficient documentation

## 2021-08-30 DIAGNOSIS — M170 Bilateral primary osteoarthritis of knee: Secondary | ICD-10-CM

## 2021-08-30 LAB — COMPREHENSIVE METABOLIC PANEL
ALT: 12 U/L (ref 0–44)
AST: 19 U/L (ref 15–41)
Albumin: 3.4 g/dL — ABNORMAL LOW (ref 3.5–5.0)
Alkaline Phosphatase: 52 U/L (ref 38–126)
Anion gap: 6 (ref 5–15)
BUN: 19 mg/dL (ref 8–23)
CO2: 29 mmol/L (ref 22–32)
Calcium: 8.9 mg/dL (ref 8.9–10.3)
Chloride: 97 mmol/L — ABNORMAL LOW (ref 98–111)
Creatinine, Ser: 1.13 mg/dL — ABNORMAL HIGH (ref 0.44–1.00)
GFR, Estimated: 51 mL/min — ABNORMAL LOW (ref >60)
Glucose, Bld: 184 mg/dL — ABNORMAL HIGH (ref 70–99)
Potassium: 4.2 mmol/L (ref 3.5–5.1)
Sodium: 132 mmol/L — ABNORMAL LOW (ref 135–145)
Total Bilirubin: 0.6 mg/dL (ref 0.3–1.2)
Total Protein: 7.5 g/dL (ref 6.5–8.1)

## 2021-08-30 LAB — CBC WITH DIFFERENTIAL/PLATELET
Abs Immature Granulocytes: 0 10*3/uL (ref 0.00–0.07)
Basophils Absolute: 0 10*3/uL (ref 0.0–0.1)
Basophils Relative: 1 %
Eosinophils Absolute: 0.2 10*3/uL (ref 0.0–0.5)
Eosinophils Relative: 5 %
HCT: 42.1 % (ref 36.0–46.0)
Hemoglobin: 13.4 g/dL (ref 12.0–15.0)
Immature Granulocytes: 0 %
Lymphocytes Relative: 24 %
Lymphs Abs: 0.8 10*3/uL (ref 0.7–4.0)
MCH: 27.3 pg (ref 26.0–34.0)
MCHC: 31.8 g/dL (ref 30.0–36.0)
MCV: 85.7 fL (ref 80.0–100.0)
Monocytes Absolute: 0.3 10*3/uL (ref 0.1–1.0)
Monocytes Relative: 10 %
Neutro Abs: 2 10*3/uL (ref 1.7–7.7)
Neutrophils Relative %: 60 %
Platelets: 155 10*3/uL (ref 150–400)
RBC: 4.91 MIL/uL (ref 3.87–5.11)
RDW: 14.1 % (ref 11.5–15.5)
WBC: 3.3 10*3/uL — ABNORMAL LOW (ref 4.0–10.5)
nRBC: 0 % (ref 0.0–0.2)

## 2021-08-30 LAB — MAGNESIUM: Magnesium: 1.9 mg/dL (ref 1.7–2.4)

## 2021-08-30 MED ORDER — OSIMERTINIB MESYLATE 40 MG PO TABS: 40.0000 mg | ORAL_TABLET | Freq: Every day | ORAL | 3 refills | Status: DC

## 2021-08-30 NOTE — Progress Notes (Signed)
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  [Dr.Byrnett & Dr.Fleming]; May 2022-MRI brain-NEG.   # MAY 11th, 2022- carbo-alimta x1 cycle; discontinued; June 6th, 2022-osimertinib 80 mg a day  #Thyroid -enlargement; recommend biopsy per radiology; patient /husband has been reluctant  # Incidental-out of place Gastric band [since 2014] gastric band.  Asymptomatic monitor for now  # NGS/MOLECULAR TESTS:POSITIVE for EGFR mutation deletion 19    # PALLIATIVE CARE EVALUATION:  # PAIN MANAGEMENT:    DIAGNOSIS: Lung cancer  STAGE:   IIIC      ;  GOALS:Palliatve  CURRENT/MOST RECENT THERAPY : Osiemertinib      Primary cancer of right upper lobe of lung ([place])  06/30/2020 Initial Diagnosis   Primary cancer of right upper lobe of lung ([place])   06/30/2020 Cancer Staging   Staging form: Lung, AJCC 8th Edition - Clinical: Stage IIIC (cT4, cN3, cM0) - Signed by Cammie Sickle, MD on 06/30/2020 Histopathologic type: Adenocarcinoma, NOS   07/05/2020 -  Chemotherapy    Patient is on Treatment Plan: LUNG CARBOPLATIN / PEMETREXED / PEMBROLIZUMAB Q21D INDUCTION X 4 CYCLES / MAINTENANCE PEMETREXED + PEMBROLIZUMAB       HISTORY OF PRESENTING ILLNESS:  She is accompanied by husband.  Ambulating independently.   Megan Shields 76 y.o.  female  Stage IV/adenocarcinoma the lung EGFR mutated-currently on osimertinib is here for  follow-up/ review results of CT scans.  Patient complains of ongoing fatigue.  Otherwise denies any worsening shortness of breath or cough.  No nausea no vomiting . No chest pain.  No diarrhea.  Chronic joint pains. Admits to compliance with her Tagrisso.  Review of Systems  Constitutional:  Positive for malaise/fatigue. Negative for chills, diaphoresis, fever and weight loss.  HENT:  Negative for nosebleeds and sore throat.   Eyes:  Negative for double vision.  Respiratory:  Negative for hemoptysis, sputum production and wheezing.   Cardiovascular:  Negative for chest pain, palpitations, orthopnea and leg swelling.  Gastrointestinal:  Negative for abdominal pain, blood in stool, constipation, diarrhea, heartburn, melena, nausea and vomiting.  Genitourinary:  Negative for dysuria, frequency and urgency.  Musculoskeletal:  Negative for back pain and joint pain.  Neurological:  Negative for dizziness, tingling, focal weakness, weakness and headaches.  Endo/Heme/Allergies:  Does not bruise/bleed easily.  Psychiatric/Behavioral:  Negative for depression. The patient is not nervous/anxious and does not have insomnia.      MEDICAL HISTORY:  Past Medical History:  Diagnosis Date  . Anemia    vitamin d deficiency  . Arthritis   . Chronic cough   . Depression   . Diabetes mellitus without complication ([place])   . Dyspnea   . Hypertension   . ILD (interstitial lung disease) ([place])   . Obesity   . Obesity    BMI 49.44  . Pneumonia   . Pneumonia due to 2019 novel coronavirus 2022   community acquired also. covid positive May 2022  . PONV (postoperative nausea and vomiting)   . Primary cancer of right upper lobe of lung ([place]) 06/30/2020  .  Rosacea   . Shoulder contusion 11/22/2015    SURGICAL HISTORY: Past Surgical History:  Procedure Laterality Date  . BLEPHAROPLASTY Bilateral 12/19/2017  . CARPAL TUNNEL RELEASE Bilateral 1985  . CHOLECYSTECTOMY    . COLONOSCOPY WITH PROPOFOL N/A  03/07/2017   Procedure: COLONOSCOPY WITH PROPOFOL;  Surgeon: [name], MD;  Location[facility] ENDOSCOPY;  Service: Gastroenterology;  Laterality: N/A;  . DILATION AND CURETTAGE OF UTERUS  12/2011   benign endometrial polyp  . HALLUX VALGUS AUSTIN Right 09/02/2014   Procedure: [place];  Surgeon: Samara Deist, DPM;  Location: [facility] ORS;  Service: Podiatry;  Laterality: Right;  . HERNIA REPAIR    . JOINT REPLACEMENT Bilateral    TKR  . LAPAROSCOPIC GASTRIC BANDING  2010   Lap Band  . LAPAROSCOPIC TOTAL HYSTERECTOMY  12/23/2019  . lymph node removed Right   . TOTAL KNEE ARTHROPLASTY Bilateral 2003, 2004  . TOTAL KNEE ARTHROPLASTY Bilateral   . VENTRAL HERNIA REPAIR  2008    SOCIAL HISTORY: Social History   Socioeconomic History  . Marital status: Married    Spouse name: Fritz Pickerel  . Number of children: Not on file  . Years of education: Not on file  . Highest education level: Not on file  Occupational History  . Occupation: Probation officer    Comment: RETIRED  Tobacco Use  . Smoking status: Never  . Smokeless tobacco: Never  Vaping Use  . Vaping Use: Never used  Substance and Sexual Activity  . Alcohol use: No    Alcohol/week: 0.0 standard drinks of alcohol  . Drug use: No  . Sexual activity: Not Currently  Other Topics Concern  . Not on file  Social History Narrative   Never smoked; no alcohol. Lives in [place] with husband . Home maker.    Daughter lives next door. 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   Social Determinants of Health   Financial Resource Strain: Low Risk  (11/22/2019)   Overall Financial Resource Strain (CARDIA)   . Difficulty of Paying Living Expenses: Not hard at all  Food Insecurity: No Food Insecurity (11/22/2019)   Hunger Vital Sign   . Worried About Charity fundraiser in the Last Year: Never true   . Ran Out of Food in the Last Year: Never true  Transportation Needs: No Transportation Needs (11/22/2019)   PRAPARE - Transportation    . Lack of Transportation (Medical): No   . Lack of Transportation (Non-Medical): No  Physical Activity: Insufficiently Active (11/22/2019)   Exercise Vital Sign   . Days of Exercise per Week: 3 days   . Minutes of Exercise per Session: 30 min  Stress: No Stress Concern Present (11/22/2019)   [place]   . Feeling of Stress : Not at all  Social Connections: Moderately Isolated (11/22/2019)   Social Connection and Isolation Panel [NHANES]   . Frequency of Communication with Friends and Family: More than three times a week   . Frequency of Social Gatherings with Friends and Family: More than three times a week   . Attends Religious Services: Never   . Active Member of Clubs or Organizations: No   . Attends Archivist Meetings: Never   . Marital Status: Married  Human resources officer Violence: Not At Risk (11/22/2019)   Humiliation, Afraid, Rape, and Kick questionnaire   . Fear of Current or Ex-Partner: No   . Emotionally Abused: No   . Physically Abused: No   .  Sexually Abused: No    FAMILY HISTORY: Family History  Problem Relation Age of Onset  . Breast cancer Mother 64  . Breast cancer Paternal Aunt        3 pat aunts  . Liver disease Brother        died 17    ALLERGIES:  is allergic to pneumococcal vaccines, wound dressing adhesive, amoxicillin, codeine, and penicillins.  MEDICATIONS:  Current Outpatient Medications  Medication Sig Dispense Refill  . acetaminophen (TYLENOL) 500 MG tablet Take 1,000 mg by mouth at bedtime as needed for moderate pain.    . celecoxib (CELEBREX) 200 MG capsule TAKE 1 CAPSULE DAILY 90 capsule 3  . cholecalciferol (VITAMIN D3) 25 MCG (1000 UNIT) tablet Take 1,000 Units by mouth daily.    Marland Kitchen FREESTYLE LITE test strip TEST BLOOD SUGAR TWICE A DAY 100 strip 6  . GLIPIZIDE XL 2.5 MG 24 hr tablet TAKE 1 TABLET DAILY WITH BREAKFAST 90 tablet 3  . Lancets (FREESTYLE) lancets TEST  BLOOD SUGAR TWICE A DAY 100 each 6  . Magnesium Cl-Calcium Carbonate (SLOW MAGNESIUM/CALCIUM) 70-117 MG TBEC Take 1 tablet by mouth 2 (two) times daily. 60 tablet 6  . meclizine (ANTIVERT) 25 MG tablet 1 pill at night as needed; if dizziness not improved can take one pill every 8 hours as needed/tolerated. 30 tablet 0  . olmesartan-hydrochlorothiazide (BENICAR HCT) 40-25 MG tablet Take 1 tablet by mouth daily. 90 tablet 1  . ondansetron (ZOFRAN) 8 MG tablet One pill every 8 hours as needed for nausea/vomitting. 40 tablet 1  . prochlorperazine (COMPAZINE) 10 MG tablet Take 1 tablet (10 mg total) by mouth every 6 (six) hours as needed for nausea or vomiting. 40 tablet 1  . osimertinib mesylate (TAGRISSO) 40 MG tablet Take 1 tablet (40 mg total) by mouth daily. 30 tablet 3   No current facility-administered medications for this visit.    Mild to moderate erythema of the left big toe/nail bed.  Nail intact  PHYSICAL EXAMINATION: ECOG PERFORMANCE STATUS: 0 - Asymptomatic  Vitals:   08/30/21 1025  BP: 135/68  Pulse: 65  Temp: (!) 97.5 F (36.4 C)  SpO2: 99%    Filed Weights   08/30/21 1025  Weight: (!) 307 lb 6.4 oz (139.4 kg)     Physical Exam Constitutional:      Comments: Obese.    HENT:     Head: Normocephalic and atraumatic.     Mouth/Throat:     Pharynx: No oropharyngeal exudate.  Eyes:     Pupils: Pupils are equal, round, and reactive to light.  Cardiovascular:     Rate and Rhythm: Normal rate and regular rhythm.  Pulmonary:     Effort: No respiratory distress.     Breath sounds: No wheezing.     Comments: Clear to auscultation bilaterally. Abdominal:     General: Bowel sounds are normal. There is no distension.     Palpations: Abdomen is soft. There is no mass.     Tenderness: There is no abdominal tenderness. There is no guarding or rebound.  Musculoskeletal:        General: No tenderness. Normal range of motion.     Cervical back: Normal range of motion and  neck supple.  Skin:    General: Skin is warm.  Neurological:     Mental Status: She is alert and oriented to person, place, and time.  Psychiatric:        Mood and Affect: Affect normal.  Erythema of the ball of the left big toe.  No discharge noted.  LABORATORY DATA:  I have reviewed the data as listed Lab Results  Component Value Date   WBC 3.3 (L) 08/30/2021   HGB 13.4 08/30/2021   HCT 42.1 08/30/2021   MCV 85.7 08/30/2021   PLT 155 08/30/2021   Recent Labs    09/19/20 0941 10/17/20 1001 05/29/21 0933 07/17/21 0939 08/30/21 1031  NA 136   < > 133* 134* 132*  K 4.2   < > 4.0 4.1 4.2  CL 100   < > 97* 95* 97*  CO2 31   < > 30 32 29  GLUCOSE 130*   < > 168* 213* 184*  BUN 20   < > '21 19 19  ' CREATININE 1.09*   < > 1.03* 1.14* 1.13*  CALCIUM 9.3   < > 9.5 8.9 8.9  GFRNONAA 54*   < > 57* 51* 51*  PROT 7.5   < > 7.7 7.5 7.5  ALBUMIN 3.2*   < > 3.5 3.3* 3.4*  AST 18   < > '19 19 19  ' ALT 11   < > '11 10 12  ' ALKPHOS 44   < > 52 53 52  BILITOT 0.6   < > 0.8 0.5 0.6  BILIDIR 0.1  --   --   --   --    < > = values in this interval not displayed.    RADIOGRAPHIC STUDIES: I have personally reviewed the radiological images as listed and agreed with the findings in the report.    ASSESSMENT & PLAN:   Primary cancer of right upper lobe of lung ([place]) #Right upper lobe lung adenocarcinoma-STAGE IV  [T4N3M1]-EGFR mutated- Exon 19 del. On. Osimertinib 80 mg once a day [07/31/2020]. JULY3rd, 2023-  Stable to slight decrease in size of right upper lobe lung nodule. No new or progressive disease identified.  # Continue osimertinib no overt progression of disease [see above]; clinicaly STABLE. Cut down- 40 mg a day [see below]  # Fatigue: Question related to osimertinib.  Discussed regarding giving holding the therapy for 1 week; decreasing the dose to 40 mg/day; new script sent. Discussed re: CARE program. Will make a referral.   # CKD stage III monitor closely. STABLE.    #Paronychia left big toe/ Nail changesr-s/p Epson salt soaking/antibacterial wash; recommend evaluation with Dr.Fowler/podiatry.   #INCIDENTAL Left sublingual fullness/ Incidental large thyroid goiter/nodule noted-suspect #Chronic joint pains-continue Celebrex every other day [see below]; Tylenol as needed for pain.  Stable  #Diabetes /type II -PBF- BG- 2.13-continue glipizide to 2.5 mg XL- Stable  #Incidental findings on Imaging  CT scan,JULY  2023:2. Similar appearance of chronic interstitial lung disease;  Aortic Atherosclerosis; I reviewed/discussed/counseled the patient.   # DISPOSITION:   # CARE PROGRAM Referral re: fatigue  # refer to ENT re: tongue mass/thyroid nodule # follow up in 6 weeks- MD; labs- cbc/cmp/mag;-Dr.B  # I reviewed the blood work- with the patient in detail; also reviewed the imaging independently [as summarized above]; and with the patient in detail.      All questions were answered. The patient knows to call the clinic with any problems, questions or concerns.    Cammie Sickle, MD 08/30/2021 12:12 PM

## 2021-08-30 NOTE — Progress Notes (Signed)
Would like to discuss Tagrisso.

## 2021-08-30 NOTE — Assessment & Plan Note (Addendum)
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 On. Osimertinib 80 mg once a day [07/31/2020]. JULY3rd, 2023-  Stable to slight decrease in size of right upper lobe lung nodule. No new or progressive disease identified.  # Continue osimertinib no overt progression of disease [see above]; clinicaly STABLE. Cut down- 40 mg a day [see below]  # Fatigue: Question related to osimertinib.  Discussed regarding giving holding the therapy for 1 week; decreasing the dose to 40 mg/day; new script sent. Discussed re: CARE program. Will make a referral.   # CKD stage III monitor closely. STABLE.   #Paronychia left big toe/ Nail changesr-s/p Epson salt soaking/antibacterial wash; recommend evaluation with Dr.Fowler/podiatry.   #INCIDENTAL Left sublingual fullness/ Incidental large thyroid goiter/nodule noted-suspect #Chronic joint pains-continue Celebrex every other day [see below]; Tylenol as needed for pain.  Stable  #Diabetes /type II -PBF- BG- 2.13-continue glipizide to 2.5 mg XL- Stable  #Incidental findings on Imaging  CT scan,JULY  2023:2. Similar appearance of chronic interstitial lung disease;  Aortic Atherosclerosis; I reviewed/discussed/counseled the patient.   # DISPOSITION:   # CARE PROGRAM Referral re: fatigue  # refer to ENT re: tongue mass/thyroid nodule # follow up in 6 weeks- MD; labs- cbc/cmp/mag;-Dr.B  # I reviewed the blood work- with the patient in detail; also reviewed the imaging independently [as summarized above]; and with the patient in detail.

## 2021-08-30 NOTE — Telephone Encounter (Signed)
Requested Prescriptions  Pending Prescriptions Disp Refills  . celecoxib (CELEBREX) 200 MG capsule [Pharmacy Med Name: CELECOXIB CAPS 200MG] 90 capsule 3    Sig: TAKE 1 CAPSULE DAILY     Analgesics:  COX2 Inhibitors Failed - 08/30/2021  8:51 AM      Failed - Manual Review: Labs are only required if the patient has taken medication for more than 8 weeks.      Failed - Cr in normal range and within 360 days    Creatinine  Date Value Ref Range Status  10/13/2012 0.93 0.60 - 1.30 mg/dL Final   Creatinine, Ser  Date Value Ref Range Status  08/30/2021 1.13 (H) 0.44 - 1.00 mg/dL Final         Passed - HGB in normal range and within 360 days    Hemoglobin  Date Value Ref Range Status  08/30/2021 13.4 12.0 - 15.0 g/dL Final  10/06/2019 14.5 11.1 - 15.9 g/dL Final         Passed - HCT in normal range and within 360 days    HCT  Date Value Ref Range Status  08/30/2021 42.1 36.0 - 46.0 % Final   Hematocrit  Date Value Ref Range Status  10/06/2019 46.0 34.0 - 46.6 % Final         Passed - AST in normal range and within 360 days    AST  Date Value Ref Range Status  08/30/2021 19 15 - 41 U/L Final         Passed - ALT in normal range and within 360 days    ALT  Date Value Ref Range Status  08/30/2021 12 0 - 44 U/L Final         Passed - eGFR is 30 or above and within 360 days    EGFR (African American)  Date Value Ref Range Status  10/13/2012 >60  Final   GFR calc Af Amer  Date Value Ref Range Status  10/06/2019 79 >59 mL/min/1.73 Final    Comment:    **[facility] currently reports eGFR in compliance with the current**   recommendations of the [employer]. [facility] will   update reporting as new guidelines are published from the NKF-ASN   Task force.    EGFR (Non-African Amer.)  Date Value Ref Range Status  10/13/2012 >60  Final    Comment:    eGFR values <39m/min/1.73 m2 may be an indication of chronic kidney disease (CKD). Calculated eGFR is useful in  patients with stable renal function. The eGFR calculation will not be reliable in acutely ill patients when serum creatinine is changing rapidly. It is not useful in  patients on dialysis. The eGFR calculation may not be applicable to patients at the low and high extremes of body sizes, pregnant women, and vegetarians.    GFR, Estimated  Date Value Ref Range Status  08/30/2021 51 (L) >60 mL/min Final    Comment:    (NOTE) Calculated using the CKD-EPI Creatinine Equation (2021)          Passed - Patient is not pregnant      Passed - Valid encounter within last 12 months    Recent Outpatient Visits          2 months ago [facility] II [facility] with [facility]   6 months ago Type II diabetes mellitus with complication [facility]   [facility]  10 months ago Essential (primary) hypertension   [facility] Glean Hess, MD   1 year ago Community acquired pneumonia of right lower lobe of lung   [facility] Glean Hess, MD   1 year ago Suspected COVID-19 virus infection   [facility] Glean Hess, MD      Future Appointments            In 1 month Army Melia, Jesse Sans, MD [facility], [facility]

## 2021-09-04 DIAGNOSIS — R221 Localized swelling, mass and lump, neck: Secondary | ICD-10-CM | POA: Diagnosis not present

## 2021-09-04 DIAGNOSIS — E041 Nontoxic single thyroid nodule: Secondary | ICD-10-CM | POA: Diagnosis not present

## 2021-09-17 ENCOUNTER — Other Ambulatory Visit: Payer: Self-pay

## 2021-10-11 ENCOUNTER — Encounter: Payer: Self-pay | Admitting: Internal Medicine

## 2021-10-11 ENCOUNTER — Inpatient Hospital Stay: Payer: Medicare Other

## 2021-10-11 ENCOUNTER — Inpatient Hospital Stay: Payer: Medicare Other | Attending: Internal Medicine | Admitting: Internal Medicine

## 2021-10-11 DIAGNOSIS — E1122 Type 2 diabetes mellitus with diabetic chronic kidney disease: Secondary | ICD-10-CM | POA: Diagnosis not present

## 2021-10-11 DIAGNOSIS — Z79899 Other long term (current) drug therapy: Secondary | ICD-10-CM | POA: Insufficient documentation

## 2021-10-11 DIAGNOSIS — R601 Generalized edema: Secondary | ICD-10-CM | POA: Diagnosis not present

## 2021-10-11 DIAGNOSIS — E049 Nontoxic goiter, unspecified: Secondary | ICD-10-CM | POA: Diagnosis not present

## 2021-10-11 DIAGNOSIS — C3411 Malignant neoplasm of upper lobe, right bronchus or lung: Secondary | ICD-10-CM | POA: Insufficient documentation

## 2021-10-11 DIAGNOSIS — M7989 Other specified soft tissue disorders: Secondary | ICD-10-CM | POA: Insufficient documentation

## 2021-10-11 DIAGNOSIS — N183 Chronic kidney disease, stage 3 unspecified: Secondary | ICD-10-CM | POA: Insufficient documentation

## 2021-10-11 DIAGNOSIS — I129 Hypertensive chronic kidney disease with stage 1 through stage 4 chronic kidney disease, or unspecified chronic kidney disease: Secondary | ICD-10-CM | POA: Diagnosis not present

## 2021-10-11 DIAGNOSIS — Z9221 Personal history of antineoplastic chemotherapy: Secondary | ICD-10-CM | POA: Insufficient documentation

## 2021-10-11 DIAGNOSIS — L03032 Cellulitis of left toe: Secondary | ICD-10-CM | POA: Insufficient documentation

## 2021-10-11 DIAGNOSIS — Z7984 Long term (current) use of oral hypoglycemic drugs: Secondary | ICD-10-CM | POA: Diagnosis not present

## 2021-10-11 DIAGNOSIS — R5381 Other malaise: Secondary | ICD-10-CM | POA: Diagnosis not present

## 2021-10-11 DIAGNOSIS — R0600 Dyspnea, unspecified: Secondary | ICD-10-CM | POA: Diagnosis not present

## 2021-10-11 LAB — CBC WITH DIFFERENTIAL/PLATELET
Abs Immature Granulocytes: 0.03 10*3/uL (ref 0.00–0.07)
Basophils Absolute: 0 10*3/uL (ref 0.0–0.1)
Basophils Relative: 1 %
Eosinophils Absolute: 0.2 10*3/uL (ref 0.0–0.5)
Eosinophils Relative: 4 %
HCT: 41 % (ref 36.0–46.0)
Hemoglobin: 12.9 g/dL (ref 12.0–15.0)
Immature Granulocytes: 1 %
Lymphocytes Relative: 20 %
Lymphs Abs: 0.9 10*3/uL (ref 0.7–4.0)
MCH: 27.4 pg (ref 26.0–34.0)
MCHC: 31.5 g/dL (ref 30.0–36.0)
MCV: 87.2 fL (ref 80.0–100.0)
Monocytes Absolute: 0.5 10*3/uL (ref 0.1–1.0)
Monocytes Relative: 12 %
Neutro Abs: 2.8 10*3/uL (ref 1.7–7.7)
Neutrophils Relative %: 62 %
Platelets: 170 10*3/uL (ref 150–400)
RBC: 4.7 MIL/uL (ref 3.87–5.11)
RDW: 14 % (ref 11.5–15.5)
WBC: 4.4 10*3/uL (ref 4.0–10.5)
nRBC: 0 % (ref 0.0–0.2)

## 2021-10-11 LAB — COMPREHENSIVE METABOLIC PANEL
ALT: 13 U/L (ref 0–44)
AST: 21 U/L (ref 15–41)
Albumin: 3.4 g/dL — ABNORMAL LOW (ref 3.5–5.0)
Alkaline Phosphatase: 57 U/L (ref 38–126)
Anion gap: 6 (ref 5–15)
BUN: 17 mg/dL (ref 8–23)
CO2: 33 mmol/L — ABNORMAL HIGH (ref 22–32)
Calcium: 9.1 mg/dL (ref 8.9–10.3)
Chloride: 98 mmol/L (ref 98–111)
Creatinine, Ser: 0.91 mg/dL (ref 0.44–1.00)
GFR, Estimated: >60 mL/min (ref >60)
Glucose, Bld: 189 mg/dL — ABNORMAL HIGH (ref 70–99)
Potassium: 4.1 mmol/L (ref 3.5–5.1)
Sodium: 137 mmol/L (ref 135–145)
Total Bilirubin: 0.7 mg/dL (ref 0.3–1.2)
Total Protein: 7.6 g/dL (ref 6.5–8.1)

## 2021-10-11 LAB — MAGNESIUM: Magnesium: 1.8 mg/dL (ref 1.7–2.4)

## 2021-10-11 LAB — BRAIN NATRIURETIC PEPTIDE: B Natriuretic Peptide: 71.7 pg/mL (ref 0.0–100.0)

## 2021-10-11 MED ORDER — FUROSEMIDE 20 MG PO TABS: 20.0000 mg | ORAL_TABLET | Freq: Every day | ORAL | 0 refills | Status: DC

## 2021-10-11 NOTE — Progress Notes (Signed)
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  [Dr.Byrnett & Dr.Fleming]; May 2022-MRI brain-NEG.   # MAY 11th, 2022- carbo-alimta x1 cycle; discontinued; June 6th, 2022-osimertinib 80 mg a day  #Thyroid -enlargement; recommend biopsy per radiology; patient /husband has been reluctant  # Incidental-out of place Gastric band [since 2014] gastric band.  Asymptomatic monitor for now  # NGS/MOLECULAR TESTS:POSITIVE for EGFR mutation deletion 19    # PALLIATIVE CARE EVALUATION:  # PAIN MANAGEMENT:    DIAGNOSIS: Lung cancer  STAGE:   IIIC      ;  GOALS:Palliatve  CURRENT/MOST RECENT THERAPY : Osiemertinib      Primary cancer of right upper lobe of lung ([place])  06/30/2020 Initial Diagnosis   Primary cancer of right upper lobe of lung ([place])   06/30/2020 Cancer Staging   Staging form: Lung, AJCC 8th Edition - Clinical: Stage IIIC (cT4, cN3, cM0) - Signed by Cammie Sickle, MD on 06/30/2020 Histopathologic type: Adenocarcinoma, NOS   07/05/2020 - 07/05/2020 Chemotherapy   Patient is on Treatment Plan : LUNG CARBOplatin / Pemetrexed / Pembrolizumab q21d Induction x 4 cycles / Maintenance Pemetrexed + Pembrolizumab      HISTORY OF PRESENTING ILLNESS:  She is accompanied by husband.  Ambulating independently.   Megan Shields 76 y.o.  female  Stage IV/adenocarcinoma the lung EGFR mutated-currently on osimertinib is here for  follow-up.  Patient's Tagrisso was decreased to 40 mg at last visit because of ongoing fatigue.  Fatigue improved. Admits to compliance with her Tagrisso.  Patient not able to tolerate care program.    Admits to weight gain.  Swelling in the legs. Otherwise denies any worsening shortness of breath or cough.  No nausea no vomiting . No chest pain.  No diarrhea.  Chronic joint pains.   Review of Systems  Constitutional:  Positive for malaise/fatigue. Negative for chills, diaphoresis, fever and weight loss.  HENT:  Negative for nosebleeds and sore throat.   Eyes:  Negative for double vision.  Respiratory:  Negative for hemoptysis, sputum production and wheezing.   Cardiovascular:  Negative for chest pain, palpitations, orthopnea and leg swelling.  Gastrointestinal:  Negative for abdominal pain, blood in stool, constipation, diarrhea, heartburn, melena, nausea and vomiting.  Genitourinary:  Negative for dysuria, frequency and urgency.  Musculoskeletal:  Negative for back pain and joint pain.  Neurological:  Negative for dizziness, tingling, focal weakness, weakness and headaches.  Endo/Heme/Allergies:  Does not bruise/bleed easily.  Psychiatric/Behavioral:  Negative for depression. The patient is not nervous/anxious and does not have insomnia.      MEDICAL HISTORY:  Past Medical History:  Diagnosis Date   Anemia    vitamin d deficiency   Arthritis    Chronic cough    Depression    Diabetes mellitus without complication ([place])    Dyspnea    Hypertension    ILD (interstitial lung disease) ([place])    Obesity    Obesity    BMI 49.44   Pneumonia    Pneumonia due to 2019 novel coronavirus 2022   community  acquired also. covid positive May 2022   PONV (postoperative nausea and vomiting)    Primary cancer of right upper lobe of lung ([place]) 06/30/2020   Rosacea    Shoulder contusion 11/22/2015    SURGICAL HISTORY: Past Surgical History:  Procedure Laterality Date   BLEPHAROPLASTY Bilateral  12/19/2017   CARPAL TUNNEL RELEASE Bilateral 1985   CHOLECYSTECTOMY     COLONOSCOPY WITH PROPOFOL N/A 03/07/2017   Procedure: COLONOSCOPY WITH PROPOFOL;  Surgeon: [name], MD;  Location: [facility] ENDOSCOPY;  Service: Gastroenterology;  Laterality: N/A;   DILATION AND CURETTAGE OF UTERUS  12/2011   benign endometrial polyp   HALLUX VALGUS AUSTIN Right 09/02/2014   Procedure: [place];  Surgeon: Samara Deist, DPM;  Location: [facility] ORS;  Service: Podiatry;  Laterality: Right;   HERNIA REPAIR     JOINT REPLACEMENT Bilateral    TKR   LAPAROSCOPIC GASTRIC BANDING  2010   Lap Band   LAPAROSCOPIC TOTAL HYSTERECTOMY  12/23/2019   lymph node removed Right    TOTAL KNEE ARTHROPLASTY Bilateral 2003, 2004   TOTAL KNEE ARTHROPLASTY Bilateral    VENTRAL HERNIA REPAIR  2008    SOCIAL HISTORY: Social History   Socioeconomic History   Marital status: Married    Spouse name: Fritz Pickerel   Number of children: Not on file   Years of education: Not on file   Highest education level: Not on file  Occupational History   Occupation: Probation officer    Comment: RETIRED  Tobacco Use   Smoking status: Never   Smokeless tobacco: Never  Vaping Use   Vaping Use: Never used  Substance and Sexual Activity   Alcohol use: No    Alcohol/week: 0.0 standard drinks of alcohol   Drug use: No   Sexual activity: Not Currently  Other Topics Concern   Not on file  Social History Narrative   Never smoked; no alcohol. Lives in [place] with husband . Home maker.    Daughter lives next door. 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   Social Determinants of Health   Financial Resource Strain: Low Risk  (11/22/2019)   Overall Financial Resource Strain (CARDIA)    Difficulty of Paying Living Expenses: Not hard at all  Food Insecurity: No Food Insecurity (11/22/2019)   Hunger Vital Sign    Worried About Running Out of Food in the Last Year: Never true    Ran Out of Food in the Last Year: Never true   Transportation Needs: No Transportation Needs (11/22/2019)   PRAPARE - Hydrologist (Medical): No    Lack of Transportation (Non-Medical): No  Physical Activity: Insufficiently Active (11/22/2019)   Exercise Vital Sign    Days of Exercise per Week: 3 days    Minutes of Exercise per Session: 30 min  Stress: No Stress Concern Present (11/22/2019)   [place]    Feeling of Stress : Not at all  Social Connections: Moderately Isolated (11/22/2019)   Social Connection and Isolation Panel [NHANES]    Frequency of Communication with Friends and Family: More than three times a week    Frequency of Social Gatherings with Friends and Family: More than three times a week    Attends Religious Services: Never    Marine scientist or Organizations: No    Attends Archivist Meetings: Never    Marital Status: Married  Human resources officer Violence: Not At Risk (11/22/2019)  Humiliation, Afraid, Rape, and Kick questionnaire    Fear of Current or Ex-Partner: No    Emotionally Abused: No    Physically Abused: No    Sexually Abused: No    FAMILY HISTORY: Family History  Problem Relation Age of Onset   Breast cancer Mother 7   Breast cancer Paternal Aunt        3 pat aunts   Liver disease Brother        died 32    ALLERGIES:  is allergic to pneumococcal vaccines, wound dressing adhesive, amoxicillin, codeine, and penicillins.  MEDICATIONS:  Current Outpatient Medications  Medication Sig Dispense Refill   acetaminophen (TYLENOL) 500 MG tablet Take 1,000 mg by mouth at bedtime as needed for moderate pain.     celecoxib (CELEBREX) 200 MG capsule TAKE 1 CAPSULE DAILY 90 capsule 3   cholecalciferol (VITAMIN D3) 25 MCG (1000 UNIT) tablet Take 1,000 Units by mouth daily.     FREESTYLE LITE test strip TEST BLOOD SUGAR TWICE A DAY 100 strip 6   furosemide (LASIX) 20 MG tablet Take 1 tablet (20 mg  total) by mouth daily. 10 tablet 0   GLIPIZIDE XL 2.5 MG 24 hr tablet TAKE 1 TABLET DAILY WITH BREAKFAST 90 tablet 3   Lancets (FREESTYLE) lancets TEST BLOOD SUGAR TWICE A DAY 100 each 6   meclizine (ANTIVERT) 25 MG tablet 1 pill at night as needed; if dizziness not improved can take one pill every 8 hours as needed/tolerated. 30 tablet 0   olmesartan-hydrochlorothiazide (BENICAR HCT) 40-25 MG tablet Take 1 tablet by mouth daily. 90 tablet 1   osimertinib mesylate (TAGRISSO) 40 MG tablet Take 1 tablet (40 mg total) by mouth daily. 30 tablet 3   Magnesium Cl-Calcium Carbonate (SLOW MAGNESIUM/CALCIUM) 70-117 MG TBEC Take 1 tablet by mouth 2 (two) times daily. (Patient not taking: Reported on 10/11/2021) 60 tablet 6   ondansetron (ZOFRAN) 8 MG tablet One pill every 8 hours as needed for nausea/vomitting. (Patient not taking: Reported on 10/11/2021) 40 tablet 1   prochlorperazine (COMPAZINE) 10 MG tablet Take 1 tablet (10 mg total) by mouth every 6 (six) hours as needed for nausea or vomiting. (Patient not taking: Reported on 10/11/2021) 40 tablet 1   No current facility-administered medications for this visit.    Mild to moderate erythema of the left big toe/nail bed.  Nail intact  PHYSICAL EXAMINATION: ECOG PERFORMANCE STATUS: 0 - Asymptomatic  Vitals:   10/11/21 1009  BP: (!) 160/50  Pulse: 70  Resp: 18  Temp: 97.6 F (36.4 C)  SpO2: 98%    Filed Weights   10/11/21 1009  Weight: (!) 311 lb 9.6 oz (141.3 kg)     Physical Exam Constitutional:      Comments: Obese.    HENT:     Head: Normocephalic and atraumatic.     Mouth/Throat:     Pharynx: No oropharyngeal exudate.  Eyes:     Pupils: Pupils are equal, round, and reactive to light.  Cardiovascular:     Rate and Rhythm: Normal rate and regular rhythm.  Pulmonary:     Effort: No respiratory distress.     Breath sounds: No wheezing.     Comments: Clear to auscultation bilaterally. Abdominal:     General: Bowel sounds are  normal. There is no distension.     Palpations: Abdomen is soft. There is no mass.     Tenderness: There is no abdominal tenderness. There is no guarding or rebound.  Musculoskeletal:  General: No tenderness. Normal range of motion.     Cervical back: Normal range of motion and neck supple.  Skin:    General: Skin is warm.  Neurological:     Mental Status: She is alert and oriented to person, place, and time.  Psychiatric:        Mood and Affect: Affect normal.    Erythema of the ball of the left big toe.  No discharge noted.  LABORATORY DATA:  I have reviewed the data as listed Lab Results  Component Value Date   WBC 4.4 10/11/2021   HGB 12.9 10/11/2021   HCT 41.0 10/11/2021   MCV 87.2 10/11/2021   PLT 170 10/11/2021   Recent Labs    07/17/21 0939 08/30/21 1031 10/11/21 0921  NA 134* 132* 137  K 4.1 4.2 4.1  CL 95* 97* 98  CO2 32 29 33*  GLUCOSE 213* 184* 189*  BUN '19 19 17  ' CREATININE 1.14* 1.13* 0.91  CALCIUM 8.9 8.9 9.1  GFRNONAA 51* 51* >60  PROT 7.5 7.5 7.6  ALBUMIN 3.3* 3.4* 3.4*  AST '19 19 21  ' ALT '10 12 13  ' ALKPHOS 53 52 57  BILITOT 0.5 0.6 0.7    RADIOGRAPHIC STUDIES: I have personally reviewed the radiological images as listed and agreed with the findings in the report.    ASSESSMENT & PLAN:   Primary cancer of right upper lobe of lung ([place]) #Right upper lobe lung adenocarcinoma-STAGE IV  [T4N3M1]-EGFR mutated- Exon 19 del. On. Osimertinib 80 mg once a day [07/31/2020]. JULY3rd, 2023-  Stable to slight decrease in size of right upper lobe lung nodule. No new or progressive disease identified.  # Continue osimertinib at 40 mg/day [severe fatigue- since July 2023];  no overt progression of disease [see above]; clinicaly STABLE.  We will plan to imaging again in October-November 2023.   # Fatigue:likley sec to osimertinib. Continue  decreased the dose to 40 mg/day.  # CKD stage III monitor closely. STABLE.   #Paronychia left big toe/ Nail  changesr-s/p Epson salt soaking/antibacterial wash; s/p  Dr.Fowler/podiatry.   # INCIDENTAL Left sublingual fullness/ Incidental large thyroid goiter/nodule noted- s/p- tongue mass/thyroid nodule- STABLE.   #Chronic joint pains-continue Celebrex every other day [see below]; Tylenol as needed for pain- Stable  #Diabetes /type II -PBF- BG- 2.13-continue glipizide to 2.5 mg XL- Stable  # weight gain- NO CHF noted; BNP normal limits.  Add lasix 20 mg/day x 10 days.  Hold potassium supplementation.  Today K- 4.1; hold kdur.  # DISPOSITION:   # ADD BNP to labs today # follow up in 6 weeks- MD; labs- cbc/cmp/mag;-Dr.B      All questions were answered. The patient knows to call the clinic with any problems, questions or concerns.    Cammie Sickle, MD 10/11/2021 10:20 PM

## 2021-10-11 NOTE — Progress Notes (Signed)
Pt states she has noticed swelling in her legs and ankles.

## 2021-10-11 NOTE — Assessment & Plan Note (Addendum)
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 On. Osimertinib 80 mg once a day [07/31/2020]. JULY3rd, 2023-  Stable to slight decrease in size of right upper lobe lung nodule. No new or progressive disease identified.  # Continue osimertinib at 40 mg/day [severe fatigue- since July 2023];  no overt progression of disease [see above]; clinicaly STABLE.  We will plan to imaging again in October-November 2023.   # Fatigue:likley sec to osimertinib. Continue  decreased the dose to 40 mg/day.  # CKD stage III monitor closely. STABLE.   #Paronychia left big toe/ Nail changesr-s/p Epson salt soaking/antibacterial wash; s/p  Dr.Fowler/podiatry.   # INCIDENTAL Left sublingual fullness/ Incidental large thyroid goiter/nodule noted- s/p- tongue mass/thyroid nodule- STABLE.   #Chronic joint pains-continue Celebrex every other day [see below]; Tylenol as needed for pain- Stable  #Diabetes /type II -PBF- BG- 2.13-continue glipizide to 2.5 mg XL- Stable  # weight gain- NO CHF noted; BNP normal limits.  Add lasix 20 mg/day x 10 days.  Hold potassium supplementation.  Today K- 4.1; hold kdur.  # DISPOSITION:   # ADD BNP to labs today # follow up in 6 weeks- MD; labs- cbc/cmp/mag;-Dr.B

## 2021-10-22 ENCOUNTER — Ambulatory Visit: Payer: Medicare Other | Admitting: Internal Medicine

## 2021-10-26 IMAGING — US US THYROID
1 series · 13 of 25 positions shown · non-contrast
Comparison: None.

CLINICAL DATA: Enlarged thyroid gland

EXAM:
THYROID ULTRASOUND
TECHNIQUE: Ultrasound examination of the thyroid gland and adjacent soft
tissues was performed.

[Series 1: us thyroid · 0.09mm/px · 13 of 62 slices shown]
[im 1/62]
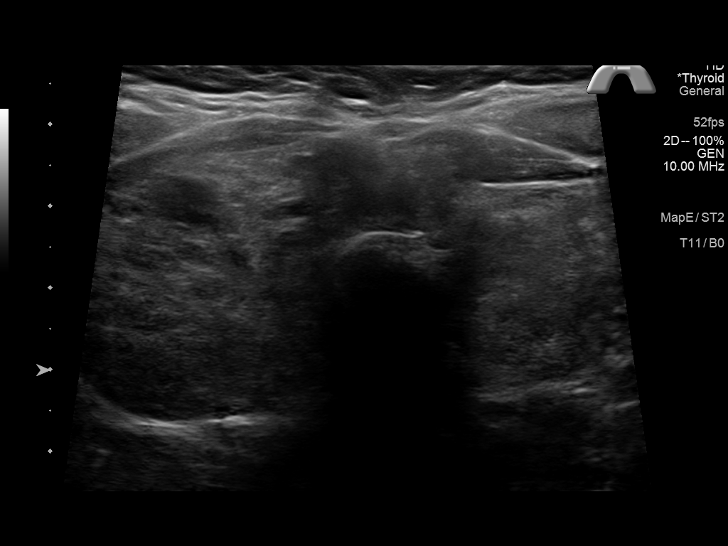
[im 6/62]
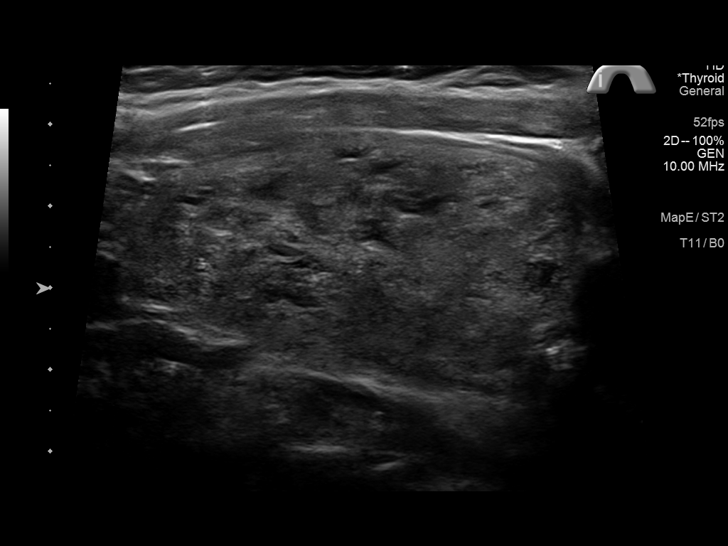
[im 11/62]
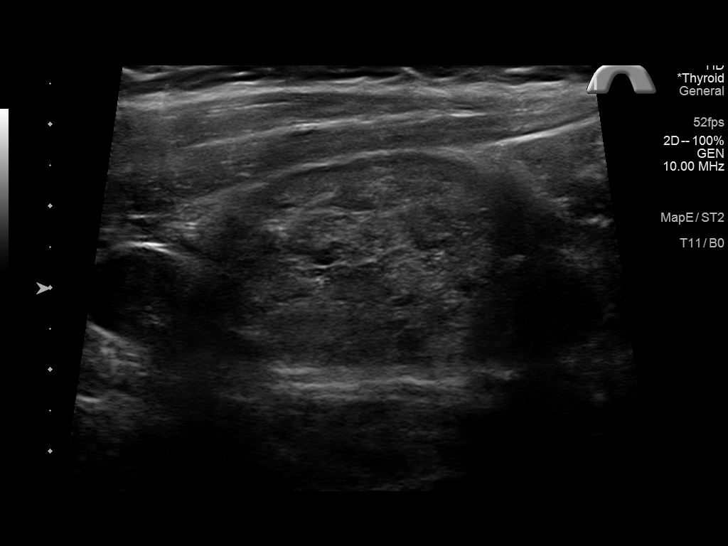
[im 16/62]
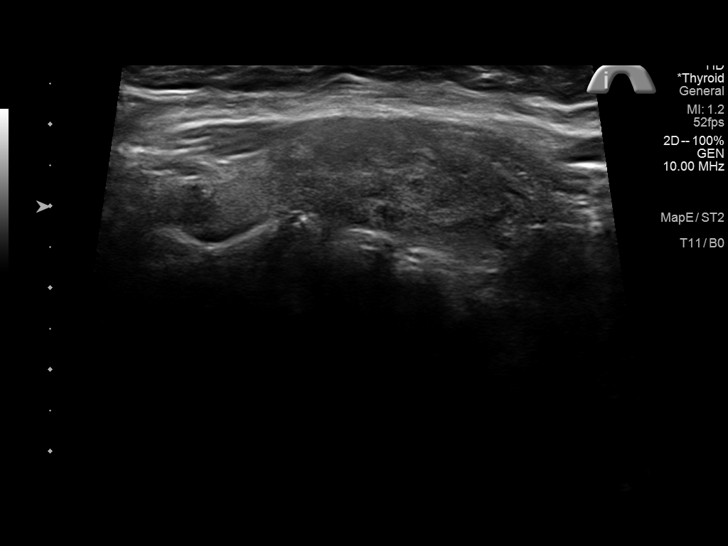
[im 21/62]
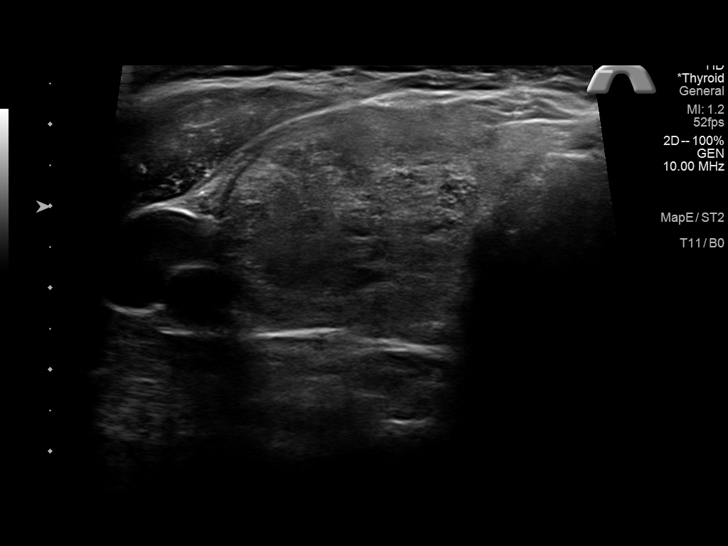
[im 26/62]
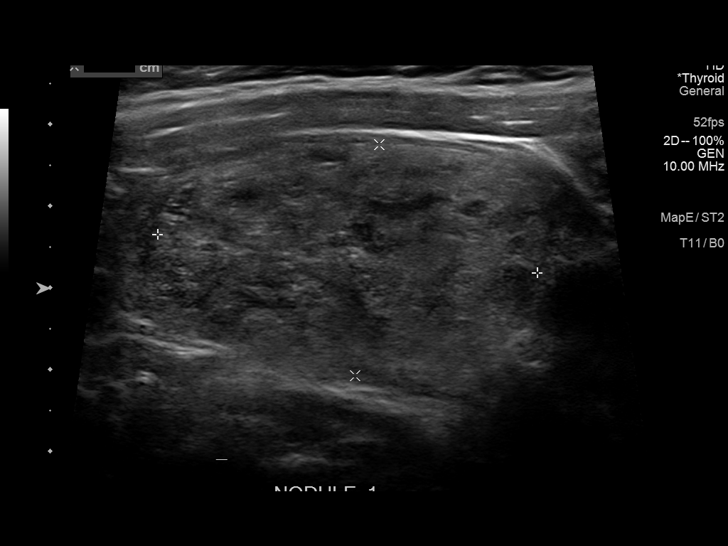
[im 31/62]
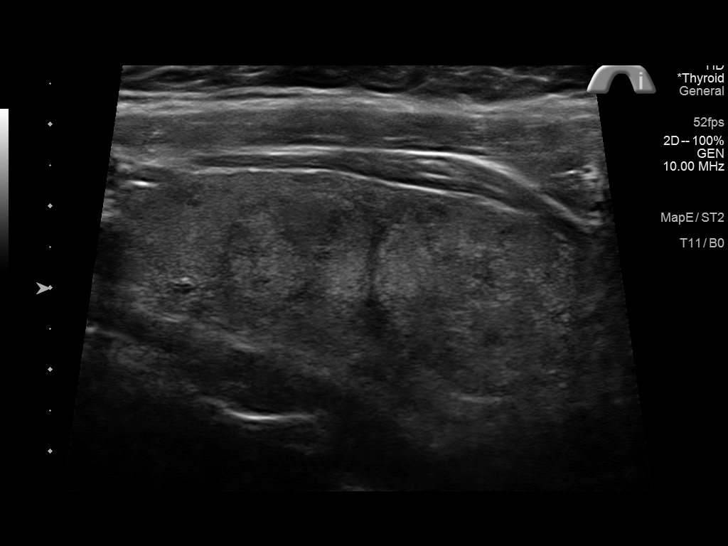
[im 36/62]
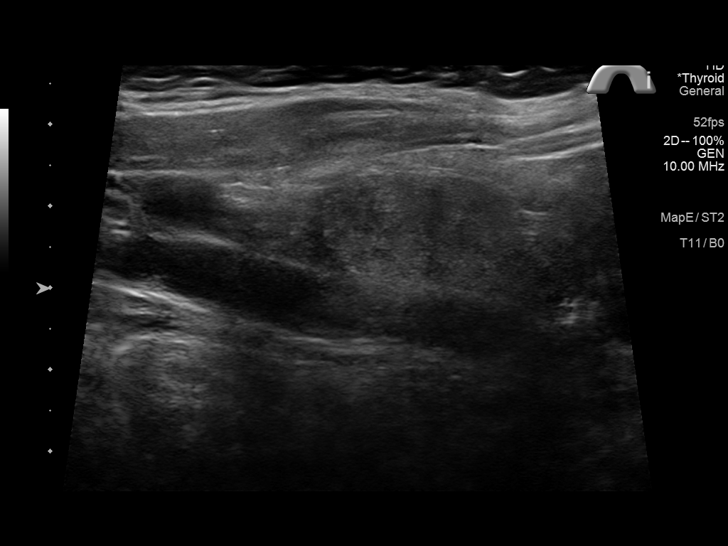
[im 41/62]
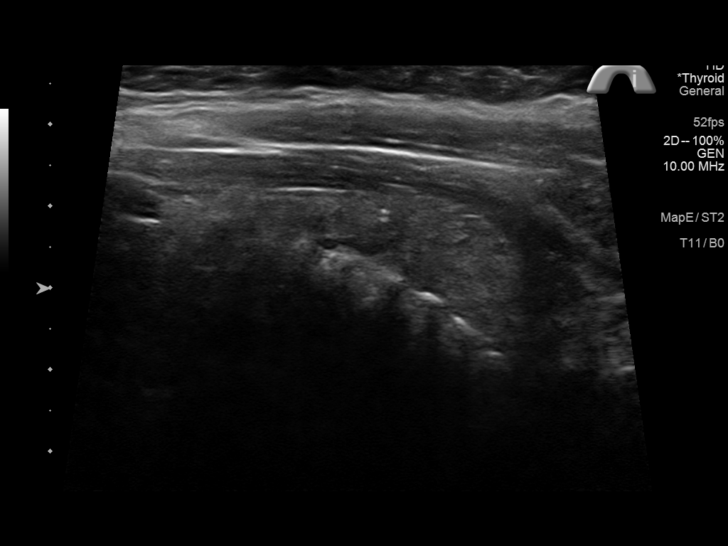
[im 46/62]
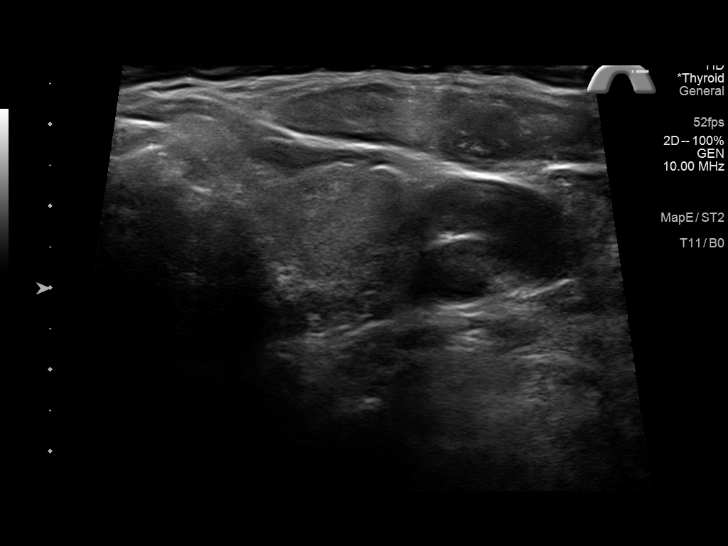
[im 51/62]
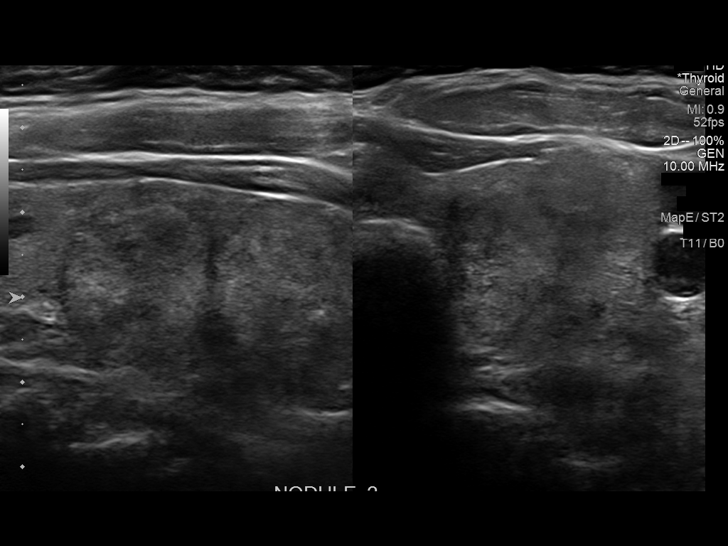
[im 56/62]
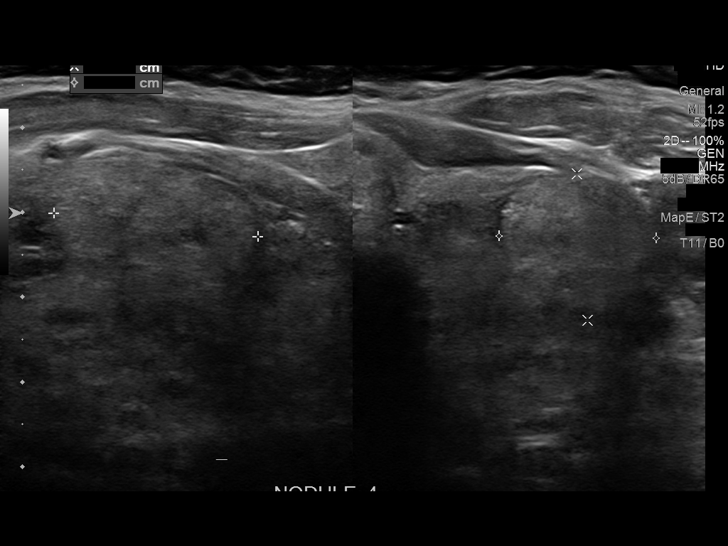
[im 62/62]
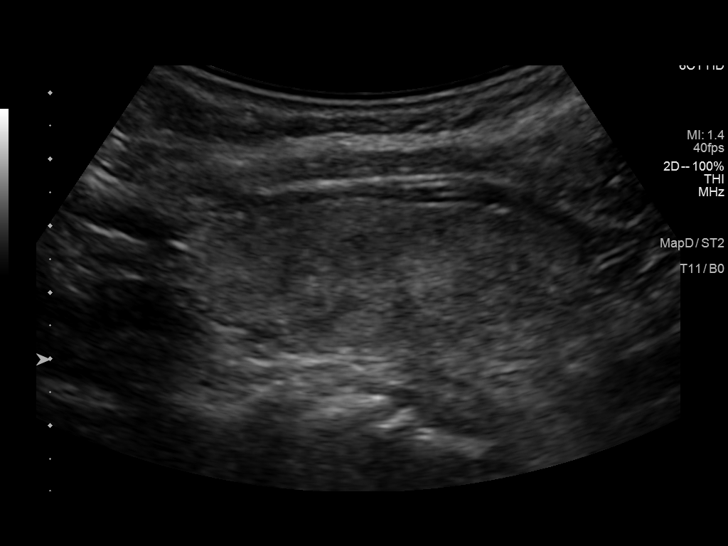

[13 of 25 positions shown; findings below may reference images not displayed]

FINDINGS: Parenchymal Echotexture: Moderately heterogenous

Isthmus: 0.9 cm

Right lobe: 5.5 x 3.0 x 3.6 cm

Left lobe: 6.0 x 2.4 x 2.7 cm

_________________________________________________________

Estimated total number of nodules >/= 1 cm: 4

Number of spongiform nodules >/=  2 cm not described below (TR1): 0

Number of mixed cystic and solid nodules >/= 1.5 cm not described
below (TR2): 0

_________________________________________________________

Nodule labeled 1 refers to a 4.7 x 4.2 x 2.8 cm solid, isoechoic
nodule (TR 3) in the right thyroid lobe. It encompasses nearly the
entire right thyroid lobe. **Given size (>/= 2.5 cm) and appearance,
fine needle aspiration of this mildly suspicious nodule should be
considered based on TI-RADS criteria.

Nodule labeled 2 refers to a 2.0 x 1.8 x 1.4 cm solid isoechoic
nodule (TR 3) in the mid left thyroid lobe. *Given size (>/= 1.5 -
2.4 cm) and appearance, a follow-up ultrasound in 1 year should be
considered based on TI-RADS criteria.

Nodule labeled 3 refers to a 2.2 x 2.2 x 2.1 cm solid isoechoic
nodule (TR 3) in the left thyroid lobe. *Given size (>/= 1.5 -
cm) and appearance, a follow-up ultrasound in 1 year should be
considered based on TI-RADS criteria.

Nodule labeled 4 refers to a 2.4 x 1.9 x 1.7 cm solid, isoechoic
nodule (TR 3) in the inferior left thyroid lobe. *Given size (>/=
1.5 - 2.4 cm) and appearance, a follow-up ultrasound in 1 year
should be considered based on TI-RADS criteria.
IMPRESSION: 1. Multinodular thyroid gland.
2. Large right-sided thyroid nodule labeled 1, which encompasses
nearly the entire right hemi thyroid, meets criteria for biopsy.
3. Multiple additional TR 3 nodules labeled 2-4 in the left thyroid
lobe meet criteria for follow-up ultrasound in 1 year.

The above is in keeping with the ACR TI-RADS recommendations - [HOSPITAL] 5674;[DATE].

## 2021-11-12 ENCOUNTER — Other Ambulatory Visit: Payer: Self-pay | Admitting: Internal Medicine

## 2021-11-12 DIAGNOSIS — I10 Essential (primary) hypertension: Secondary | ICD-10-CM

## 2021-11-13 NOTE — Telephone Encounter (Signed)
Requested Prescriptions  Pending Prescriptions Disp Refills  . olmesartan-hydrochlorothiazide (BENICAR HCT) 40-25 MG tablet [Pharmacy Med Name: OLMESARTAN/HCTZ TABS 40/25MG] 90 tablet 0    Sig: TAKE 1 TABLET DAILY     Cardiovascular: ARB + Diuretic Combos Failed - 11/12/2021  8:33 AM      Failed - Last BP in normal range    BP Readings from Last 1 Encounters:  10/11/21 (!) 160/50         Passed - K in normal range and within 180 days    Potassium  Date Value Ref Range Status  10/11/2021 4.1 3.5 - 5.1 mmol/L Final  01/22/2012 4.0 3.5 - 5.1 mmol/L Final         Passed - Na in normal range and within 180 days    Sodium  Date Value Ref Range Status  10/11/2021 137 135 - 145 mmol/L Final  10/06/2019 140 134 - 144 mmol/L Final  01/22/2012 136 136 - 145 mmol/L Final         Passed - Cr in normal range and within 180 days    Creatinine  Date Value Ref Range Status  10/13/2012 0.93 0.60 - 1.30 mg/dL Final   Creatinine, Ser  Date Value Ref Range Status  10/11/2021 0.91 0.44 - 1.00 mg/dL Final         Passed - eGFR is 10 or above and within 180 days    EGFR (African American)  Date Value Ref Range Status  10/13/2012 >60  Final   GFR calc Af Amer  Date Value Ref Range Status  10/06/2019 79 >59 mL/min/1.73 Final    Comment:    **[facility] currently reports eGFR in compliance with the current**   recommendations of the [employer]. [facility] will   update reporting as new guidelines are published from the NKF-ASN   Task force.    EGFR (Non-African Amer.)  Date Value Ref Range Status  10/13/2012 >60  Final    Comment:    eGFR values <32m/min/1.73 m2 may be an indication of chronic kidney disease (CKD). Calculated eGFR is useful in patients with stable renal function. The eGFR calculation will not be reliable in acutely ill patients when serum creatinine is changing rapidly. It is not useful in  patients on dialysis. The eGFR calculation may not be  applicable to patients at the low and high extremes of body sizes, pregnant women, and vegetarians.    GFR, Estimated  Date Value Ref Range Status  10/11/2021 >60 >60 mL/min Final    Comment:    (NOTE) Calculated using the CKD-EPI Creatinine Equation (2021)          Passed - Patient is not pregnant      Passed - Valid encounter within last 6 months    Recent Outpatient Visits          5 months ago [place]   [place] [facility]   9 months ago Type II diabetes mellitus with complication ([facility]   [place] [facility]   1 year ago Essential (primary) hypertension   [place] [facility]   1 year ago Community acquired pneumonia of right lower lobe of lung   [facility]   1  year ago Suspected COVID-19 virus infection   [place] [facility] at [facility], Jesse Sans, MD      Future Appointments            In 1 week Army Melia, Jesse Sans, MD [facility] at [facility], [facility]

## 2021-11-21 ENCOUNTER — Ambulatory Visit: Payer: Self-pay

## 2021-11-21 ENCOUNTER — Other Ambulatory Visit: Payer: Self-pay | Admitting: *Deleted

## 2021-11-21 DIAGNOSIS — C3411 Malignant neoplasm of upper lobe, right bronchus or lung: Secondary | ICD-10-CM

## 2021-11-21 NOTE — Patient Instructions (Signed)
Visit Information  Thank you for taking time to visit with me today. Please don't hesitate to contact me if I can be of assistance to you.   Following are the goals we discussed today:   Goals Addressed             This Visit's Progress    COMPLETED: RNCM: Effective Management of Health and well being       Care Coordination Interventions: Evaluation of current treatment plan related to chronic conditions  and patient's adherence to plan as established by provider Advised patient to call the office for new concerns, questions, or needs  Provided education to patient re: the reason for the call and the care coordination program with RNCM being available. Reviewed scheduled/upcoming provider appointments including 11-26-2021 at 120 pm, reminder given Screening for signs and symptoms of depression related to chronic disease state  Assessed social determinant of health barriers The patient feels her health conditions are stable at this time. She sees her providers on a regular basis. Reviewed with the patient the goals of the care coordination program and how to reach the [facility].              Please call the care guide team at [phone] if you need to schedule an appointment.   If you are experiencing a Mental Health or [place] or need someone to talk to, please call the Suicide and Crisis Lifeline: 988 call the [place] National Suicide Prevention Lifeline: (475)549-1097 or TTY: (815) 201-2395 TTY 662-022-0483) to talk to a trained counselor call 1-800-273-TALK (toll free, 24 hour hotline)  Patient verbalizes understanding of instructions and care plan provided today and agrees to view in [place]. Active MyChart status and patient understanding of how to access instructions and care plan via MyChart confirmed with patient.     No further follow up required: the patient instructed to call the [facility] for new concerns, questions, or educational needs.   Noreene Larsson RN, MSN,  [facility]  Mobile: [phone]

## 2021-11-21 NOTE — Patient Outreach (Signed)
  Care Coordination   Initial Visit Note   11/21/2021 Name: Mazzie Brodrick MRN: [id number] DOB: [birth year]  Vinie Charity is a 76 y.o. year old female who sees Glean Hess, MD for primary care. I spoke with  Nyra Jabs by phone today.  What matters to the patients health and wellness today?  The patient feels she is doing well and is stable. Review of upcoming appointments with pcp    Goals Addressed             This Visit's Progress    COMPLETED: RNCM: Effective Management of Health and well being       Care Coordination Interventions: Evaluation of current treatment plan related to chronic conditions  and patient's adherence to plan as established by provider Advised patient to call the office for new concerns, questions, or needs  Provided education to patient re: the reason for the call and the care coordination program with RNCM being available. Reviewed scheduled/upcoming provider appointments including 11-26-2021 at 120 pm, reminder given Screening for signs and symptoms of depression related to chronic disease state  Assessed social determinant of health barriers The patient feels her health conditions are stable at this time. She sees her providers on a regular basis. Reviewed with the patient the goals of the care coordination program and how to reach the [facility].            SDOH assessments and interventions completed:  Yes  SDOH Interventions Today    Flowsheet Row Most Recent Value  SDOH Interventions   Food Insecurity Interventions Intervention Not Indicated  Housing Interventions Intervention Not Indicated  Transportation Interventions Intervention Not Indicated  Utilities Interventions Intervention Not Indicated        Care Coordination Interventions Activated:  Yes  Care Coordination Interventions:  Yes, provided   Follow up plan: No further intervention required.   Encounter Outcome:  Pt. Visit Completed   Noreene Larsson RN, MSN, [place]  Mobile: [phone]

## 2021-11-22 ENCOUNTER — Encounter: Payer: Self-pay | Admitting: Internal Medicine

## 2021-11-22 ENCOUNTER — Inpatient Hospital Stay: Payer: Medicare Other | Attending: Internal Medicine | Admitting: Internal Medicine

## 2021-11-22 ENCOUNTER — Inpatient Hospital Stay: Payer: Medicare Other

## 2021-11-22 VITALS — BP 173/83 | HR 67 | Temp 97.5°F | Resp 18

## 2021-11-22 DIAGNOSIS — C3411 Malignant neoplasm of upper lobe, right bronchus or lung: Secondary | ICD-10-CM

## 2021-11-22 DIAGNOSIS — Z9221 Personal history of antineoplastic chemotherapy: Secondary | ICD-10-CM | POA: Insufficient documentation

## 2021-11-22 DIAGNOSIS — C770 Secondary and unspecified malignant neoplasm of lymph nodes of head, face and neck: Secondary | ICD-10-CM | POA: Diagnosis not present

## 2021-11-22 LAB — CBC WITH DIFFERENTIAL/PLATELET
Abs Immature Granulocytes: 0.02 10*3/uL (ref 0.00–0.07)
Basophils Absolute: 0 10*3/uL (ref 0.0–0.1)
Basophils Relative: 0 %
Eosinophils Absolute: 0.2 10*3/uL (ref 0.0–0.5)
Eosinophils Relative: 5 %
HCT: 41.3 % (ref 36.0–46.0)
Hemoglobin: 13.2 g/dL (ref 12.0–15.0)
Immature Granulocytes: 0 %
Lymphocytes Relative: 20 %
Lymphs Abs: 0.9 10*3/uL (ref 0.7–4.0)
MCH: 27.4 pg (ref 26.0–34.0)
MCHC: 32 g/dL (ref 30.0–36.0)
MCV: 85.7 fL (ref 80.0–100.0)
Monocytes Absolute: 0.5 10*3/uL (ref 0.1–1.0)
Monocytes Relative: 12 %
Neutro Abs: 2.9 10*3/uL (ref 1.7–7.7)
Neutrophils Relative %: 63 %
Platelets: 178 10*3/uL (ref 150–400)
RBC: 4.82 MIL/uL (ref 3.87–5.11)
RDW: 13.9 % (ref 11.5–15.5)
WBC: 4.6 10*3/uL (ref 4.0–10.5)
nRBC: 0 % (ref 0.0–0.2)

## 2021-11-22 LAB — COMPREHENSIVE METABOLIC PANEL
ALT: 12 U/L (ref 0–44)
AST: 18 U/L (ref 15–41)
Albumin: 3.4 g/dL — ABNORMAL LOW (ref 3.5–5.0)
Alkaline Phosphatase: 57 U/L (ref 38–126)
Anion gap: 5 (ref 5–15)
BUN: 17 mg/dL (ref 8–23)
CO2: 32 mmol/L (ref 22–32)
Calcium: 9 mg/dL (ref 8.9–10.3)
Chloride: 99 mmol/L (ref 98–111)
Creatinine, Ser: 0.93 mg/dL (ref 0.44–1.00)
GFR, Estimated: >60 mL/min (ref >60)
Glucose, Bld: 156 mg/dL — ABNORMAL HIGH (ref 70–99)
Potassium: 4.3 mmol/L (ref 3.5–5.1)
Sodium: 136 mmol/L (ref 135–145)
Total Bilirubin: 0.5 mg/dL (ref 0.3–1.2)
Total Protein: 7.4 g/dL (ref 6.5–8.1)

## 2021-11-22 LAB — MAGNESIUM: Magnesium: 1.7 mg/dL (ref 1.7–2.4)

## 2021-11-22 NOTE — Progress Notes (Signed)
[place] NOTE  Patient Care Team: Glean Hess, MD as PCP - General (Internal Medicine) 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  [Dr.Byrnett & Dr.Fleming]; May 2022-MRI brain-NEG.   # MAY 11th, 2022- carbo-alimta x1 cycle; discontinued; June 6th, 2022-osimertinib 80 mg a day  #Thyroid -enlargement; recommend biopsy per radiology; patient /husband has been reluctant  # Incidental-out of place Gastric band [since 2014] gastric band.  Asymptomatic monitor for now  # NGS/MOLECULAR TESTS:POSITIVE for EGFR mutation deletion 19    # PALLIATIVE CARE EVALUATION:  # PAIN MANAGEMENT:    DIAGNOSIS: Lung cancer  STAGE:   IIIC      ;  GOALS:Palliatve  CURRENT/MOST RECENT THERAPY : Osiemertinib      Primary cancer of right upper lobe of lung ([place])  06/30/2020 Initial Diagnosis   Primary cancer of right upper lobe of lung ([place])   06/30/2020 Cancer Staging   Staging form: Lung, AJCC 8th Edition - Clinical: Stage IIIC (cT4, cN3, cM0) - Signed by Cammie Sickle, MD on 06/30/2020 Histopathologic type: Adenocarcinoma, NOS   07/05/2020 - 07/05/2020 Chemotherapy   Patient is on Treatment Plan : LUNG CARBOplatin / Pemetrexed / Pembrolizumab q21d Induction x 4 cycles / Maintenance Pemetrexed + Pembrolizumab      HISTORY OF PRESENTING ILLNESS:  She is accompanied by husband.  Ambulating independently.   Megan Shields 76 y.o.  female  Stage IV/adenocarcinoma the lung EGFR mutated-currently on osimertinib is here for  follow-up.  Patient's Tagrisso was decreased to 40 mg at last visit because of ongoing fatigue.  Fatigue improved. Admits to compliance with her Tagrisso.  Patient not able to tolerate care program.    Admits to weight gain.  Swelling in the legs. Otherwise denies any worsening shortness of breath or cough.  No nausea no vomiting . No chest pain.  No diarrhea.  Chronic joint pains.   Review of Systems  Constitutional:  Positive for malaise/fatigue. Negative for chills, diaphoresis, fever and weight loss.  HENT:  Negative for nosebleeds and sore throat.   Eyes:  Negative for double vision.  Respiratory:  Negative for hemoptysis, sputum production and wheezing.   Cardiovascular:  Negative for chest pain, palpitations, orthopnea and leg swelling.  Gastrointestinal:  Negative for abdominal pain, blood in stool, constipation, diarrhea, heartburn, melena, nausea and vomiting.  Genitourinary:  Negative for dysuria, frequency and urgency.  Musculoskeletal:  Negative for back pain and joint pain.  Neurological:  Negative for dizziness, tingling, focal weakness, weakness and headaches.  Endo/Heme/Allergies:  Does not bruise/bleed easily.  Psychiatric/Behavioral:  Negative for depression. The patient is not nervous/anxious and does not have insomnia.      MEDICAL HISTORY:  Past Medical History:  Diagnosis Date   Anemia    vitamin d deficiency   Arthritis    Chronic cough    Depression    Diabetes mellitus without complication ([place])    Dyspnea    Hypertension    ILD (interstitial lung disease) ([place])    Obesity    Obesity    BMI 49.44   Pneumonia    Pneumonia due to 2019 novel coronavirus 2022   community  acquired also. covid positive May 2022   PONV (postoperative nausea and vomiting)    Primary cancer of right upper lobe of lung ([place]) 06/30/2020   Rosacea    Shoulder contusion 11/22/2015    SURGICAL HISTORY: Past Surgical History:  Procedure Laterality Date   BLEPHAROPLASTY Bilateral  12/19/2017   CARPAL TUNNEL RELEASE Bilateral 1985   CHOLECYSTECTOMY     COLONOSCOPY WITH PROPOFOL N/A 03/07/2017   Procedure: COLONOSCOPY WITH PROPOFOL;  Surgeon: [name], MD;  Location: [facility] ENDOSCOPY;  Service: Gastroenterology;  Laterality: N/A;   DILATION AND CURETTAGE OF UTERUS  12/2011   benign endometrial polyp   HALLUX VALGUS AUSTIN Right 09/02/2014   Procedure: [place];  Surgeon: Samara Deist, DPM;  Location: [facility] ORS;  Service: Podiatry;  Laterality: Right;   HERNIA REPAIR     JOINT REPLACEMENT Bilateral    TKR   LAPAROSCOPIC GASTRIC BANDING  2010   Lap Band   LAPAROSCOPIC TOTAL HYSTERECTOMY  12/23/2019   lymph node removed Right    TOTAL KNEE ARTHROPLASTY Bilateral 2003, 2004   TOTAL KNEE ARTHROPLASTY Bilateral    VENTRAL HERNIA REPAIR  2008    SOCIAL HISTORY: Social History   Socioeconomic History   Marital status: Married    Spouse name: Fritz Pickerel   Number of children: Not on file   Years of education: Not on file   Highest education level: Not on file  Occupational History   Occupation: Probation officer    Comment: RETIRED  Tobacco Use   Smoking status: Never   Smokeless tobacco: Never  Vaping Use   Vaping Use: Never used  Substance and Sexual Activity   Alcohol use: No    Alcohol/week: 0.0 standard drinks of alcohol   Drug use: No   Sexual activity: Not Currently  Other Topics Concern   Not on file  Social History Narrative   Never smoked; no alcohol. Lives in [place] with husband . Home maker.    Daughter lives next door. 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   Social Determinants of Health   Financial Resource Strain: Low Risk  (11/22/2019)   Overall Financial Resource Strain (CARDIA)    Difficulty of Paying Living Expenses: Not hard at all  Food Insecurity: No Food Insecurity (11/21/2021)   Hunger Vital Sign    Worried About Running Out of Food in the Last Year: Never true    Ran Out of Food in the Last Year: Never true   Transportation Needs: No Transportation Needs (11/21/2021)   PRAPARE - Hydrologist (Medical): No    Lack of Transportation (Non-Medical): No  Physical Activity: Insufficiently Active (11/22/2019)   Exercise Vital Sign    Days of Exercise per Week: 3 days    Minutes of Exercise per Session: 30 min  Stress: No Stress Concern Present (11/22/2019)   [place]    Feeling of Stress : Not at all  Social Connections: Moderately Isolated (11/22/2019)   Social Connection and Isolation Panel [NHANES]    Frequency of Communication with Friends and Family: More than three times a week    Frequency of Social Gatherings with Friends and Family: More than three times a week    Attends Religious Services: Never    Marine scientist or Organizations: No    Attends Archivist Meetings: Never    Marital Status: Married  Human resources officer Violence: Not At Risk (11/22/2019)  Humiliation, Afraid, Rape, and Kick questionnaire    Fear of Current or Ex-Partner: No    Emotionally Abused: No    Physically Abused: No    Sexually Abused: No    FAMILY HISTORY: Family History  Problem Relation Age of Onset   Breast cancer Mother 54   Breast cancer Paternal Aunt        3 pat aunts   Liver disease Brother        died 27    ALLERGIES:  is allergic to pneumococcal vaccines, wound dressing adhesive, amoxicillin, codeine, and penicillins.  MEDICATIONS:  Current Outpatient Medications  Medication Sig Dispense Refill   acetaminophen (TYLENOL) 500 MG tablet Take 1,000 mg by mouth at bedtime as needed for moderate pain.     celecoxib (CELEBREX) 200 MG capsule TAKE 1 CAPSULE DAILY 90 capsule 3   cholecalciferol (VITAMIN D3) 25 MCG (1000 UNIT) tablet Take 1,000 Units by mouth daily.     FREESTYLE LITE test strip TEST BLOOD SUGAR TWICE A DAY 100 strip 6   furosemide (LASIX) 20 MG tablet Take 1 tablet (20 mg  total) by mouth daily. 10 tablet 0   GLIPIZIDE XL 2.5 MG 24 hr tablet TAKE 1 TABLET DAILY WITH BREAKFAST 90 tablet 3   Lancets (FREESTYLE) lancets TEST BLOOD SUGAR TWICE A DAY 100 each 6   meclizine (ANTIVERT) 25 MG tablet 1 pill at night as needed; if dizziness not improved can take one pill every 8 hours as needed/tolerated. 30 tablet 0   olmesartan-hydrochlorothiazide (BENICAR HCT) 40-25 MG tablet TAKE 1 TABLET DAILY 90 tablet 0   osimertinib mesylate (TAGRISSO) 40 MG tablet Take 1 tablet (40 mg total) by mouth daily. 30 tablet 3   Magnesium Cl-Calcium Carbonate (SLOW MAGNESIUM/CALCIUM) 70-117 MG TBEC Take 1 tablet by mouth 2 (two) times daily. (Patient not taking: Reported on 10/11/2021) 60 tablet 6   ondansetron (ZOFRAN) 8 MG tablet One pill every 8 hours as needed for nausea/vomitting. (Patient not taking: Reported on 10/11/2021) 40 tablet 1   prochlorperazine (COMPAZINE) 10 MG tablet Take 1 tablet (10 mg total) by mouth every 6 (six) hours as needed for nausea or vomiting. (Patient not taking: Reported on 10/11/2021) 40 tablet 1   No current facility-administered medications for this visit.    Mild to moderate erythema of the left big toe/nail bed.  Nail intact  PHYSICAL EXAMINATION: ECOG PERFORMANCE STATUS: 0 - Asymptomatic  Vitals:   11/22/21 1100  BP: (!) 173/83  Pulse: 67  Resp: 18  Temp: (!) 97.5 F (36.4 C)  SpO2: 95%    There were no vitals filed for this visit.    Physical Exam Constitutional:      Comments: Obese.    HENT:     Head: Normocephalic and atraumatic.     Mouth/Throat:     Pharynx: No oropharyngeal exudate.  Eyes:     Pupils: Pupils are equal, round, and reactive to light.  Cardiovascular:     Rate and Rhythm: Normal rate and regular rhythm.  Pulmonary:     Effort: No respiratory distress.     Breath sounds: No wheezing.     Comments: Clear to auscultation bilaterally. Abdominal:     General: Bowel sounds are normal. There is no distension.      Palpations: Abdomen is soft. There is no mass.     Tenderness: There is no abdominal tenderness. There is no guarding or rebound.  Musculoskeletal:        General: No  tenderness. Normal range of motion.     Cervical back: Normal range of motion and neck supple.  Skin:    General: Skin is warm.  Neurological:     Mental Status: She is alert and oriented to person, place, and time.  Psychiatric:        Mood and Affect: Affect normal.    Erythema of the ball of the left big toe.  No discharge noted.  LABORATORY DATA:  I have reviewed the data as listed Lab Results  Component Value Date   WBC 4.6 11/22/2021   HGB 13.2 11/22/2021   HCT 41.3 11/22/2021   MCV 85.7 11/22/2021   PLT 178 11/22/2021   Recent Labs    08/30/21 1031 10/11/21 0921 11/22/21 1033  NA 132* 137 136  K 4.2 4.1 4.3  CL 97* 98 99  CO2 29 33* 32  GLUCOSE 184* 189* 156*  BUN '19 17 17  ' CREATININE 1.13* 0.91 0.93  CALCIUM 8.9 9.1 9.0  GFRNONAA 51* >60 >60  PROT 7.5 7.6 7.4  ALBUMIN 3.4* 3.4* 3.4*  AST '19 21 18  ' ALT '12 13 12  ' ALKPHOS 52 57 57  BILITOT 0.6 0.7 0.5     RADIOGRAPHIC STUDIES: I have personally reviewed the radiological images as listed and agreed with the findings in the report.    ASSESSMENT & PLAN:   No problem-specific Assessment & Plan notes found for this encounter.   All questions were answered. The patient knows to call the clinic with any problems, questions or concerns.    Cammie Sickle, MD 11/22/2021 11:40 AM

## 2021-11-22 NOTE — Assessment & Plan Note (Signed)
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 On. Osimertinib 80 mg once a day [07/31/2020]. JULY3rd, 2023-  Stable to slight decrease in size of right upper lobe lung nodule. No new or progressive disease identified.  # Continue osimertinib at 40 mg/day [severe fatigue- since July 2023];  no overt progression of disease [see above]; clinicaly STABLE.  We will plan to imaging again in October-November 2023.   # Fatigue: likley sec to osimertinib. Continue  decreased the dose to 40 mg/day.STABLE.   # CKD stage III monitor closely. STABLE.   #Paronychia left big toe/ Nail changesr-s/p Epson salt soaking/antibacterial wash; s/p  Dr.Fowler/podiatry.   # INCIDENTAL Left sublingual fullness/ Incidental large thyroid goiter/nodule noted- s/p- tongue mass/thyroid nodule- STABLE.   #Chronic joint pains-continue Celebrex every other day [see below]; Tylenol as needed for pain- Stable  #Diabetes /type II -PBF- BG- 158-continue glipizide to 5 mg XL- Stable  # weight gain- NO CHF noted; BNP normal limits.  Add lasix 20 mg/day x 10 days.  Hold potassium supplementation.  Today K- 4.1; hold kdur.  # DISPOSITION:   # follow up in 6 weeks- MD; labs- cbc/cmp/mag CT CAP; CT neck -;-Dr.B

## 2021-11-22 NOTE — Progress Notes (Signed)
Patient denies new problems/concerns today.   °

## 2021-11-26 ENCOUNTER — Encounter: Payer: Self-pay | Admitting: Internal Medicine

## 2021-11-26 ENCOUNTER — Ambulatory Visit (INDEPENDENT_AMBULATORY_CARE_PROVIDER_SITE_OTHER): Payer: Medicare Other | Admitting: Internal Medicine

## 2021-11-26 VITALS — BP 124/78 | HR 87 | Ht 64.0 in | Wt 305.0 lb

## 2021-11-26 DIAGNOSIS — Z23 Encounter for immunization: Secondary | ICD-10-CM | POA: Diagnosis not present

## 2021-11-26 DIAGNOSIS — I10 Essential (primary) hypertension: Secondary | ICD-10-CM | POA: Diagnosis not present

## 2021-11-26 DIAGNOSIS — E1169 Type 2 diabetes mellitus with other specified complication: Secondary | ICD-10-CM

## 2021-11-26 DIAGNOSIS — E118 Type 2 diabetes mellitus with unspecified complications: Secondary | ICD-10-CM

## 2021-11-26 DIAGNOSIS — E049 Nontoxic goiter, unspecified: Secondary | ICD-10-CM

## 2021-11-26 DIAGNOSIS — C3411 Malignant neoplasm of upper lobe, right bronchus or lung: Secondary | ICD-10-CM | POA: Diagnosis not present

## 2021-11-26 DIAGNOSIS — E785 Hyperlipidemia, unspecified: Secondary | ICD-10-CM | POA: Diagnosis not present

## 2021-11-26 MED ORDER — CYCLOBENZAPRINE HCL 10 MG PO TABS: 10.0000 mg | ORAL_TABLET | Freq: Every day | ORAL | 1 refills | Status: AC

## 2021-11-26 NOTE — Progress Notes (Signed)
  Date:  11/26/2021   Name:  Megan Shields   DOB:  07/08/1945   MRN:  [id number]   Chief Complaint: Diabetes  Diabetes She presents for her follow-up diabetic visit. She has type 2 diabetes mellitus. Her disease course has been stable. Pertinent negatives for hypoglycemia include no headaches or tremors. Pertinent negatives for diabetes include no chest pain, no fatigue, no polydipsia and no polyuria. There are no hypoglycemic complications. Current diabetic treatment includes oral agent (monotherapy) (glipizide 2.5 mg).  Hypertension This is a chronic problem. The problem is controlled. Pertinent negatives include no chest pain, headaches, palpitations or shortness of breath.  Hyperlipidemia This is a chronic problem. The problem is controlled. Pertinent negatives include no chest pain or shortness of breath. Current antihyperlipidemic treatment includes statins. The current treatment provides significant improvement of lipids.  Lung Cancer: as of 09/2021 Primary cancer of right upper lobe of lung (HCC) #Right upper lobe lung adenocarcinoma-STAGE IV  [T4N3M1]-EGFR mutated- Exon 19 del. On. Osimertinib 80 mg once a day [07/31/2020]. JULY3rd, 2023-  Stable to slight decrease in size of right upper lobe lung nodule. No new or progressive disease identified.   # Continue osimertinib at 40 mg/day [severe fatigue- since July 2023];  no overt progression of disease [see above]; clinicaly STABLE.  We will plan to imaging again in October-November 2023.  Lab Results  Component Value Date   NA 136 11/22/2021   K 4.3 11/22/2021   CO2 32 11/22/2021   GLUCOSE 156 (H) 11/22/2021   BUN 17 11/22/2021   CREATININE 0.93 11/22/2021   CALCIUM 9.0 11/22/2021   GFRNONAA >60 11/22/2021   Lab Results  Component Value Date   CHOL 194 10/09/2020   HDL 78 10/09/2020   LDLCALC 100 (H) 10/09/2020   TRIG 93 10/09/2020   CHOLHDL 2.5 10/09/2020   Lab Results  Component Value Date   TSH 1.680 10/06/2019    Lab Results  Component Value Date   HGBA1C 7.1 (A) 02/07/2021   Lab Results  Component Value Date   WBC 4.6 11/22/2021   HGB 13.2 11/22/2021   HCT 41.3 11/22/2021   MCV 85.7 11/22/2021   PLT 178 11/22/2021   Lab Results  Component Value Date   ALT 12 11/22/2021   AST 18 11/22/2021   ALKPHOS 57 11/22/2021   BILITOT 0.5 11/22/2021   Lab Results  Component Value Date   VD25OH 34.9 03/07/2020     Review of Systems  Constitutional:  Negative for appetite change, fatigue, fever and unexpected weight change.  HENT:  Negative for tinnitus and trouble swallowing.   Eyes:  Negative for visual disturbance.  Respiratory:  Negative for cough, chest tightness and shortness of breath.   Cardiovascular:  Negative for chest pain, palpitations and leg swelling.  Gastrointestinal:  Negative for abdominal pain.  Endocrine: Negative for polydipsia and polyuria.  Genitourinary:  Negative for dysuria and hematuria.  Musculoskeletal:  Negative for arthralgias.  Neurological:  Negative for tremors, numbness and headaches.  Psychiatric/Behavioral:  Negative for dysphoric mood.     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   Vitamin D deficiency 10/06/2019   Rosacea 04/08/2019   Hyperlipidemia associated with type 2 diabetes mellitus (HCC) 04/08/2019   Vertigo 04/07/2018   Tubular adenoma of colon 03/10/2017   Tinea pedis of left foot 11/22/2016     Type II diabetes mellitus with complication ([place]) 16/55/3748   Essential (primary) hypertension 08/01/2014   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   Arthritis of knee, degenerative 08/01/2014   Low back strain 08/01/2014   Status post bariatric surgery 08/01/2014    Allergies  Allergen Reactions   Pneumococcal Vaccines Hives   Wound Dressing Adhesive Rash    Paper tape  is okay. Tears off skin. blisters   Amoxicillin Rash   Codeine     Other reaction(s): drowsiness   Penicillins Rash    Past Surgical History:  Procedure Laterality Date   BLEPHAROPLASTY Bilateral 12/19/2017   CARPAL TUNNEL RELEASE Bilateral 1985   CHOLECYSTECTOMY     COLONOSCOPY WITH PROPOFOL N/A 03/07/2017   Procedure: COLONOSCOPY WITH PROPOFOL;  Surgeon: [name], MD;  Location: [facility] ENDOSCOPY;  Service: Gastroenterology;  Laterality: N/A;   DILATION AND CURETTAGE OF UTERUS  12/2011   benign endometrial polyp   HALLUX VALGUS AUSTIN Right 09/02/2014   Procedure: [place];  Surgeon: Samara Deist, DPM;  Location: [facility] ORS;  Service: Podiatry;  Laterality: Right;   HERNIA REPAIR     JOINT REPLACEMENT Bilateral    TKR   LAPAROSCOPIC GASTRIC BANDING  2010   Lap Band   LAPAROSCOPIC TOTAL HYSTERECTOMY  12/23/2019   lymph node removed Right    TOTAL KNEE ARTHROPLASTY Bilateral 2003, 2004   TOTAL KNEE ARTHROPLASTY Bilateral    VENTRAL HERNIA REPAIR  2008    Social History   Tobacco Use   Smoking status: Never   Smokeless tobacco: Never  Vaping Use   Vaping Use: Never used  Substance Use Topics   Alcohol use: No    Alcohol/week: 0.0 standard drinks of alcohol   Drug use: No     Medication list has been reviewed and updated.  Current Meds  Medication Sig   acetaminophen (TYLENOL) 500 MG tablet Take 1,000 mg by mouth at bedtime as needed for moderate pain.   celecoxib (CELEBREX) 200 MG capsule TAKE 1 CAPSULE DAILY   cholecalciferol (VITAMIN D3) 25 MCG (1000 UNIT) tablet Take 1,000 Units by mouth daily.   FREESTYLE LITE test strip TEST BLOOD SUGAR TWICE A DAY   GLIPIZIDE XL 2.5 MG 24 hr tablet TAKE 1 TABLET DAILY WITH BREAKFAST   Lancets (FREESTYLE) lancets TEST BLOOD SUGAR TWICE A DAY   Magnesium Cl-Calcium Carbonate (SLOW MAGNESIUM/CALCIUM) 70-117 MG TBEC Take 1 tablet by mouth 2 (two) times daily.   meclizine (ANTIVERT) 25 MG tablet 1 pill at night as  needed; if dizziness not improved can take one pill every 8 hours as needed/tolerated.   olmesartan-hydrochlorothiazide (BENICAR HCT) 40-25 MG tablet TAKE 1 TABLET DAILY   ondansetron (ZOFRAN) 8 MG tablet One pill every 8 hours as needed for nausea/vomitting.   osimertinib mesylate (TAGRISSO) 40 MG tablet Take 1 tablet (40 mg total) by mouth daily.   prochlorperazine (COMPAZINE) 10 MG tablet Take 1 tablet (10 mg total) by mouth every 6 (six) hours as needed for nausea or vomiting.       06/08/2021    9:35 AM 02/07/2021    9:42 AM 10/09/2020   10:11 AM 04/26/2020   11:18 AM  GAD 7 : Generalized Anxiety Score  Nervous, Anxious, on Edge 0 0 0 0  Control/stop worrying 0 0 0 0  Worry too much - different things 0 1 0 0  Trouble relaxing 0 0 0 0  Restless 0 0 0 0  Easily annoyed or irritable 0 0 0 0  Afraid - awful might happen 0 0 0 0  Total GAD 7 Score 0 1 0 0  Anxiety Difficulty Not difficult at all Not difficult at all         06/08/2021    9:35 AM 02/07/2021    9:42 AM 10/09/2020   10:11 AM  Depression screen PHQ 2/9  Decreased Interest 0 0 0  Down, Depressed, Hopeless 0 0 0  PHQ - 2 Score 0 0 0  Altered sleeping 0 2 1  Tired, decreased energy 3 3 3  Change in appetite 2 1 0  Feeling bad or failure about yourself  0 0 0  Trouble concentrating 0 0 0  Moving slowly or fidgety/restless 0 0 0  Suicidal thoughts 0 0 0  PHQ-9 Score 5 6 4  Difficult doing work/chores Not difficult at all Not difficult at all Not difficult at all    BP Readings from Last 3 Encounters:  11/26/21 124/78  11/22/21 (!) 173/83  10/11/21 (!) 160/50    Physical Exam Vitals and nursing note reviewed.  Constitutional:      General: She is not in acute distress.    Appearance: Normal appearance. She is well-developed. She is obese.  HENT:     Head: Normocephalic and atraumatic.  Neck:     Vascular: No carotid bruit.  Cardiovascular:     Rate and Rhythm: Normal rate and regular rhythm.     Heart  sounds: No murmur heard. Pulmonary:     Effort: Pulmonary effort is normal. No respiratory distress.     Breath sounds: No wheezing or rhonchi.  Musculoskeletal:     Cervical back: Normal range of motion.     Right lower leg: No edema.     Left lower leg: No edema.  Lymphadenopathy:     Cervical: No cervical adenopathy.  Skin:    General: Skin is warm and dry.     Capillary Refill: Capillary refill takes less than 2 seconds.     Findings: No rash.  Neurological:     General: No focal deficit present.     Mental Status: She is alert and oriented to person, place, and time.  Psychiatric:        Mood and Affect: Mood normal.        Behavior: Behavior normal.     Wt Readings from Last 3 Encounters:  11/26/21 (!) 305 lb (138.3 kg)  10/11/21 (!) 311 lb 9.6 oz (141.3 kg)  08/30/21 (!) 307 lb 6.4 oz (139.4 kg)    BP 124/78   Pulse 87   Ht 5' 4" (1.626 m)   Wt (!) 305 lb (138.3 kg)   SpO2 97%   BMI 52.35 kg/m   Assessment and Plan: 1. Type II diabetes mellitus with complication (HCC) Now on glipizide XL 5 mg daily. Clinically stable by exam and report without s/s of hypoglycemia. DM complicated by hypertension and dyslipidemia. Tolerating medications well without side effects or other concerns. - Hemoglobin A1c  2. Essential (primary) hypertension Clinically stable exam with well controlled BP. Tolerating medications without side effects at this time. Pt to continue current regimen and low sodium diet; benefits of regular exercise as able discussed.  3. Hyperlipidemia associated with type 2 diabetes mellitus (HCC) Tolerating statin medication without side effects at this time LDL is at goal of < 70 on current dose Continue same therapy without change at this time. - Lipid panel  4. Primary cancer of right upper lobe of lung (  HCC) Stable medication side effects Followed closely every 6 weeks with labs, Oncology visit  5. Need for immunization against influenza - Flu  Vaccine QUAD High Dose(Fluad)   Partially dictated using Dragon software. Any errors are unintentional.  Laura Berglund, MD [facility]  11/26/2021     

## 2021-11-27 LAB — LIPID PANEL
Chol/HDL Ratio: 2.6 ratio (ref 0.0–4.4)
Cholesterol, Total: 192 mg/dL (ref 100–199)
HDL: 75 mg/dL (ref >39)
LDL Chol Calc (NIH): 98 mg/dL (ref 0–99)
Triglycerides: 107 mg/dL (ref 0–149)
VLDL Cholesterol Cal: 19 mg/dL (ref 5–40)

## 2021-11-27 LAB — HEMOGLOBIN A1C
Est. average glucose Bld gHb Est-mCnc: 180 mg/dL
Hgb A1c MFr Bld: 7.9 % — ABNORMAL HIGH (ref 4.8–5.6)

## 2021-12-06 ENCOUNTER — Other Ambulatory Visit: Payer: TRICARE For Life (TFL)

## 2021-12-07 ENCOUNTER — Ambulatory Visit: Payer: TRICARE For Life (TFL)

## 2021-12-07 ENCOUNTER — Ambulatory Visit
Admission: RE | Admit: 2021-12-07 | Discharge: 2021-12-07 | Disposition: A | Discharge status: Home / Self Care | Payer: Medicare Other | Source: Ambulatory Visit | Attending: Internal Medicine | Admitting: Internal Medicine

## 2021-12-07 DIAGNOSIS — C3490 Malignant neoplasm of unspecified part of unspecified bronchus or lung: Secondary | ICD-10-CM | POA: Diagnosis not present

## 2021-12-07 DIAGNOSIS — C3411 Malignant neoplasm of upper lobe, right bronchus or lung: Secondary | ICD-10-CM | POA: Insufficient documentation

## 2021-12-07 DIAGNOSIS — K760 Fatty (change of) liver, not elsewhere classified: Secondary | ICD-10-CM | POA: Diagnosis not present

## 2021-12-07 DIAGNOSIS — J929 Pleural plaque without asbestos: Secondary | ICD-10-CM | POA: Diagnosis not present

## 2021-12-07 DIAGNOSIS — R918 Other nonspecific abnormal finding of lung field: Secondary | ICD-10-CM | POA: Diagnosis not present

## 2021-12-07 MED ORDER — IOHEXOL 300 MG/ML  SOLN
100.0000 mL | Freq: Once | INTRAMUSCULAR | Status: AC | PRN
  Administered 2021-12-07: 100 mL via INTRAVENOUS

## 2021-12-18 ENCOUNTER — Other Ambulatory Visit: Payer: Self-pay

## 2021-12-18 DIAGNOSIS — C3411 Malignant neoplasm of upper lobe, right bronchus or lung: Secondary | ICD-10-CM

## 2021-12-18 MED ORDER — OSIMERTINIB MESYLATE 40 MG PO TABS: 40.0000 mg | ORAL_TABLET | Freq: Every day | ORAL | 3 refills | Status: DC

## 2021-12-18 NOTE — Telephone Encounter (Signed)
Last visit 11/22/21:  Patient's Tagrisso was decreased to 40 mg at last visit because of ongoing fatigue.  Fatigue improved. Admits to compliance with her Tagrisso.  Patient not able to tolerate care program.    follow up in 6 weeks- MD; labs- cbc/cmp/mag CT CAP; CT neck -;-Dr.B  CT on 10/13 F/U on 11/10

## 2022-01-04 ENCOUNTER — Ambulatory Visit: Payer: TRICARE For Life (TFL) | Admitting: Internal Medicine

## 2022-01-04 ENCOUNTER — Other Ambulatory Visit: Payer: TRICARE For Life (TFL)

## 2022-01-10 DIAGNOSIS — E113293 Type 2 diabetes mellitus with mild nonproliferative diabetic retinopathy without macular edema, bilateral: Secondary | ICD-10-CM | POA: Diagnosis not present

## 2022-01-10 LAB — HM DIABETES EYE EXAM

## 2022-01-28 ENCOUNTER — Inpatient Hospital Stay: Payer: Medicare Other | Attending: Internal Medicine

## 2022-01-28 ENCOUNTER — Inpatient Hospital Stay (HOSPITAL_BASED_OUTPATIENT_CLINIC_OR_DEPARTMENT_OTHER): Payer: Medicare Other | Admitting: Internal Medicine

## 2022-01-28 ENCOUNTER — Encounter: Payer: Self-pay | Admitting: Internal Medicine

## 2022-01-28 VITALS — BP 163/82 | HR 69 | Temp 97.6°F | Resp 18 | Wt 309.9 lb

## 2022-01-28 DIAGNOSIS — N183 Chronic kidney disease, stage 3 unspecified: Secondary | ICD-10-CM | POA: Diagnosis not present

## 2022-01-28 DIAGNOSIS — I129 Hypertensive chronic kidney disease with stage 1 through stage 4 chronic kidney disease, or unspecified chronic kidney disease: Secondary | ICD-10-CM | POA: Diagnosis not present

## 2022-01-28 DIAGNOSIS — Z79899 Other long term (current) drug therapy: Secondary | ICD-10-CM | POA: Insufficient documentation

## 2022-01-28 DIAGNOSIS — E1122 Type 2 diabetes mellitus with diabetic chronic kidney disease: Secondary | ICD-10-CM | POA: Diagnosis not present

## 2022-01-28 DIAGNOSIS — C3411 Malignant neoplasm of upper lobe, right bronchus or lung: Secondary | ICD-10-CM | POA: Diagnosis not present

## 2022-01-28 DIAGNOSIS — C770 Secondary and unspecified malignant neoplasm of lymph nodes of head, face and neck: Secondary | ICD-10-CM | POA: Insufficient documentation

## 2022-01-28 LAB — CBC WITH DIFFERENTIAL/PLATELET
Abs Immature Granulocytes: 0.01 10*3/uL (ref 0.00–0.07)
Basophils Absolute: 0 10*3/uL (ref 0.0–0.1)
Basophils Relative: 1 %
Eosinophils Absolute: 0.3 10*3/uL (ref 0.0–0.5)
Eosinophils Relative: 5 %
HCT: 43.2 % (ref 36.0–46.0)
Hemoglobin: 13.9 g/dL (ref 12.0–15.0)
Immature Granulocytes: 0 %
Lymphocytes Relative: 22 %
Lymphs Abs: 1.3 10*3/uL (ref 0.7–4.0)
MCH: 27.4 pg (ref 26.0–34.0)
MCHC: 32.2 g/dL (ref 30.0–36.0)
MCV: 85 fL (ref 80.0–100.0)
Monocytes Absolute: 0.7 10*3/uL (ref 0.1–1.0)
Monocytes Relative: 11 %
Neutro Abs: 3.6 10*3/uL (ref 1.7–7.7)
Neutrophils Relative %: 61 %
Platelets: 191 10*3/uL (ref 150–400)
RBC: 5.08 MIL/uL (ref 3.87–5.11)
RDW: 13.8 % (ref 11.5–15.5)
WBC: 5.8 10*3/uL (ref 4.0–10.5)
nRBC: 0 % (ref 0.0–0.2)

## 2022-01-28 LAB — COMPREHENSIVE METABOLIC PANEL
ALT: 14 U/L (ref 0–44)
AST: 21 U/L (ref 15–41)
Albumin: 3.6 g/dL (ref 3.5–5.0)
Alkaline Phosphatase: 61 U/L (ref 38–126)
Anion gap: 9 (ref 5–15)
BUN: 19 mg/dL (ref 8–23)
CO2: 30 mmol/L (ref 22–32)
Calcium: 9.4 mg/dL (ref 8.9–10.3)
Chloride: 98 mmol/L (ref 98–111)
Creatinine, Ser: 1 mg/dL (ref 0.44–1.00)
GFR, Estimated: 59 mL/min — ABNORMAL LOW (ref >60)
Glucose, Bld: 135 mg/dL — ABNORMAL HIGH (ref 70–99)
Potassium: 4.3 mmol/L (ref 3.5–5.1)
Sodium: 137 mmol/L (ref 135–145)
Total Bilirubin: 0.5 mg/dL (ref 0.3–1.2)
Total Protein: 7.9 g/dL (ref 6.5–8.1)

## 2022-01-28 LAB — MAGNESIUM: Magnesium: 1.7 mg/dL (ref 1.7–2.4)

## 2022-01-28 MED ORDER — PROCHLORPERAZINE MALEATE 10 MG PO TABS: 10.0000 mg | ORAL_TABLET | Freq: Four times a day (QID) | ORAL | 1 refills | Status: DC | PRN

## 2022-01-28 MED ORDER — GLIPIZIDE ER 5 MG PO TB24: 5.0000 mg | ORAL_TABLET | Freq: Every day | ORAL | 1 refills | Status: DC

## 2022-01-28 NOTE — Progress Notes (Signed)
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  [Dr.Byrnett & Dr.Fleming]; May 2022-MRI brain-NEG.   # MAY 11th, 2022- carbo-alimta x1 cycle; discontinued; June 6th, 2022-osimertinib 80 mg a day  #Thyroid -enlargement; recommend biopsy per radiology; patient /husband has been reluctant  # Incidental-out of place Gastric band [since 2014] gastric band.  Asymptomatic monitor for now  # NGS/MOLECULAR TESTS:POSITIVE for EGFR mutation deletion 19    # PALLIATIVE CARE EVALUATION:  # PAIN MANAGEMENT:    DIAGNOSIS: Lung cancer  STAGE:   IIIC      ;  GOALS:Palliatve  CURRENT/MOST RECENT THERAPY : Osiemertinib      Primary cancer of right upper lobe of lung ([place])  06/30/2020 Initial Diagnosis   Primary cancer of right upper lobe of lung ([place])   06/30/2020 Cancer Staging   Staging form: Lung, AJCC 8th Edition - Clinical: Stage IIIC (cT4, cN3, cM0) - Signed by Cammie Sickle, MD on 06/30/2020 Histopathologic type: Adenocarcinoma, NOS   07/05/2020 - 07/05/2020 Chemotherapy   Patient is on Treatment Plan : LUNG CARBOplatin / Pemetrexed / Pembrolizumab q21d Induction x 4 cycles / Maintenance Pemetrexed + Pembrolizumab      HISTORY OF PRESENTING ILLNESS:  She is accompanied by husband.  Ambulating independently.   Megan Shields 76 y.o.  female  Stage IV/adenocarcinoma the lung EGFR mutated-currently on osimertinib is here for  follow-up/review of the CT scan.  Patient denies new problems/concerns today. Fatigue improved. Admits to compliance with her Tagrisso.    Admits to weight gain.  Swelling in the legs. Otherwise denies any worsening shortness of breath or cough.  No nausea no vomiting . No chest pain.  No diarrhea.  Chronic joint pains.   Review of Systems  Constitutional:  Positive for malaise/fatigue. Negative for chills, diaphoresis, fever and weight loss.  HENT:  Negative for nosebleeds and sore throat.   Eyes:  Negative for double vision.  Respiratory:  Negative for hemoptysis, sputum production and wheezing.   Cardiovascular:  Negative for chest pain, palpitations, orthopnea and leg swelling.  Gastrointestinal:  Negative for abdominal pain, blood in stool, constipation, diarrhea, heartburn, melena, nausea and vomiting.  Genitourinary:  Negative for dysuria, frequency and urgency.  Musculoskeletal:  Negative for back pain and joint pain.  Neurological:  Negative for dizziness, tingling, focal weakness, weakness and headaches.  Endo/Heme/Allergies:  Does not bruise/bleed easily.  Psychiatric/Behavioral:  Negative for depression. The patient is not nervous/anxious and does not have insomnia.      MEDICAL HISTORY:  Past Medical History:  Diagnosis Date   Anemia    vitamin d deficiency   Arthritis    Chronic cough    Depression    Diabetes mellitus without complication ([place])    Dyspnea    Hypertension    ILD (interstitial lung disease) ([place])    Obesity    Obesity    BMI 49.44   Pneumonia    Pneumonia due to 2019 novel coronavirus 2022   community acquired also. covid positive May 2022   PONV (postoperative nausea and vomiting)  Primary cancer of right upper lobe of lung ([place]) 06/30/2020   Rosacea    Shoulder contusion 11/22/2015    SURGICAL HISTORY: Past Surgical History:  Procedure Laterality Date   BLEPHAROPLASTY Bilateral 12/19/2017   CARPAL TUNNEL RELEASE Bilateral 1985    CHOLECYSTECTOMY     COLONOSCOPY WITH PROPOFOL N/A 03/07/2017   Procedure: COLONOSCOPY WITH PROPOFOL;  Surgeon: [name], MD;  Location: [facility] ENDOSCOPY;  Service: Gastroenterology;  Laterality: N/A;   DILATION AND CURETTAGE OF UTERUS  12/2011   benign endometrial polyp   HALLUX VALGUS AUSTIN Right 09/02/2014   Procedure: [place];  Surgeon: Samara Deist, DPM;  Location: [facility] ORS;  Service: Podiatry;  Laterality: Right;   HERNIA REPAIR     JOINT REPLACEMENT Bilateral    TKR   LAPAROSCOPIC GASTRIC BANDING  2010   Lap Band   LAPAROSCOPIC TOTAL HYSTERECTOMY  12/23/2019   lymph node removed Right    TOTAL KNEE ARTHROPLASTY Bilateral 2003, 2004   TOTAL KNEE ARTHROPLASTY Bilateral    VENTRAL HERNIA REPAIR  2008    SOCIAL HISTORY: Social History   Socioeconomic History   Marital status: Married    Spouse name: Fritz Pickerel   Number of children: Not on file   Years of education: Not on file   Highest education level: Not on file  Occupational History   Occupation: Probation officer    Comment: RETIRED  Tobacco Use   Smoking status: Never   Smokeless tobacco: Never  Vaping Use   Vaping Use: Never used  Substance and Sexual Activity   Alcohol use: No    Alcohol/week: 0.0 standard drinks of alcohol   Drug use: No   Sexual activity: Not Currently  Other Topics Concern   Not on file  Social History Narrative   Never smoked; no alcohol. Lives in [place] with husband . Home maker.    Daughter lives next door. 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   Social Determinants of Health   Financial Resource Strain: Low Risk  (11/22/2019)   Overall Financial Resource Strain (CARDIA)    Difficulty of Paying Living Expenses: Not hard at all  Food Insecurity: No Food Insecurity (11/21/2021)   Hunger Vital Sign    Worried About Running Out of Food in the Last Year: Never true    Ran Out of Food in the Last Year: Never true  Transportation Needs: No Transportation Needs (11/21/2021)    PRAPARE - Hydrologist (Medical): No    Lack of Transportation (Non-Medical): No  Physical Activity: Insufficiently Active (11/22/2019)   Exercise Vital Sign    Days of Exercise per Week: 3 days    Minutes of Exercise per Session: 30 min  Stress: No Stress Concern Present (11/22/2019)   [place]    Feeling of Stress : Not at all  Social Connections: Moderately Isolated (11/22/2019)   Social Connection and Isolation Panel [NHANES]    Frequency of Communication with Friends and Family: More than three times a week    Frequency of Social Gatherings with Friends and Family: More than three times a week    Attends Religious Services: Never    Marine scientist or Organizations: No    Attends Archivist Meetings: Never    Marital Status: Married  Human resources officer Violence: Not At Risk (11/22/2019)   Humiliation, Afraid, Rape, and Kick questionnaire    Fear of Current or Ex-Partner: No  Emotionally Abused: No    Physically Abused: No    Sexually Abused: No    FAMILY HISTORY: Family History  Problem Relation Age of Onset   Breast cancer Mother 76   Breast cancer Paternal Aunt        3 pat aunts   Liver disease Brother        died 31    ALLERGIES:  is allergic to pneumococcal vaccines, wound dressing adhesive, amoxicillin, codeine, and penicillins.  MEDICATIONS:  Current Outpatient Medications  Medication Sig Dispense Refill   acetaminophen (TYLENOL) 500 MG tablet Take 1,000 mg by mouth at bedtime as needed for moderate pain.     celecoxib (CELEBREX) 200 MG capsule TAKE 1 CAPSULE DAILY 90 capsule 3   cholecalciferol (VITAMIN D3) 25 MCG (1000 UNIT) tablet Take 1,000 Units by mouth daily.     cyclobenzaprine (FLEXERIL) 10 MG tablet Take 1 tablet (10 mg total) by mouth at bedtime. 30 tablet 1   FREESTYLE LITE test strip TEST BLOOD SUGAR TWICE A DAY 100 strip 6   glipiZIDE  (GLUCOTROL XL) 5 MG 24 hr tablet Take 1 tablet (5 mg total) by mouth daily with breakfast. 90 tablet 1   Lancets (FREESTYLE) lancets TEST BLOOD SUGAR TWICE A DAY 100 each 6   meclizine (ANTIVERT) 25 MG tablet 1 pill at night as needed; if dizziness not improved can take one pill every 8 hours as needed/tolerated. 30 tablet 0   olmesartan-hydrochlorothiazide (BENICAR HCT) 40-25 MG tablet TAKE 1 TABLET DAILY 90 tablet 0   ondansetron (ZOFRAN) 8 MG tablet One pill every 8 hours as needed for nausea/vomitting. 40 tablet 1   osimertinib mesylate (TAGRISSO) 40 MG tablet Take 1 tablet (40 mg total) by mouth daily. 30 tablet 3   Magnesium Cl-Calcium Carbonate (SLOW MAGNESIUM/CALCIUM) 70-117 MG TBEC Take 1 tablet by mouth 2 (two) times daily. (Patient not taking: Reported on 01/28/2022) 60 tablet 6   prochlorperazine (COMPAZINE) 10 MG tablet Take 1 tablet (10 mg total) by mouth every 6 (six) hours as needed for nausea or vomiting. 40 tablet 1   No current facility-administered medications for this visit.    Mild to moderate erythema of the left big toe/nail bed.  Nail intact  PHYSICAL EXAMINATION: ECOG PERFORMANCE STATUS: 0 - Asymptomatic  Vitals:   01/28/22 1500  BP: (!) 163/82  Pulse: 69  Resp: 18  Temp: 97.6 F (36.4 C)  SpO2: 95%    Filed Weights   01/28/22 1500  Weight: (!) 309 lb 14.4 oz (140.6 kg)      Physical Exam Constitutional:      Comments: Obese.    HENT:     Head: Normocephalic and atraumatic.     Mouth/Throat:     Pharynx: No oropharyngeal exudate.  Eyes:     Pupils: Pupils are equal, round, and reactive to light.  Cardiovascular:     Rate and Rhythm: Normal rate and regular rhythm.  Pulmonary:     Effort: No respiratory distress.     Breath sounds: No wheezing.     Comments: Clear to auscultation bilaterally. Abdominal:     General: Bowel sounds are normal. There is no distension.     Palpations: Abdomen is soft. There is no mass.     Tenderness: There is no  abdominal tenderness. There is no guarding or rebound.  Musculoskeletal:        General: No tenderness. Normal range of motion.     Cervical back: Normal range of motion  and neck supple.  Skin:    General: Skin is warm.  Neurological:     Mental Status: She is alert and oriented to person, place, and time.  Psychiatric:        Mood and Affect: Affect normal.    Erythema of the ball of the left big toe.  No discharge noted.  LABORATORY DATA:  I have reviewed the data as listed Lab Results  Component Value Date   WBC 5.8 01/28/2022   HGB 13.9 01/28/2022   HCT 43.2 01/28/2022   MCV 85.0 01/28/2022   PLT 191 01/28/2022   Recent Labs    10/11/21 0921 11/22/21 1033 01/28/22 1435  NA 137 136 137  K 4.1 4.3 4.3  CL 98 99 98  CO2 33* 32 30  GLUCOSE 189* 156* 135*  BUN _0 CREATININE 0.91 0.93 1.00  CALCIUM 9.1 9.0 9.4  GFRNONAA >60 >60 59*  PROT 7.6 7.4 7.9  ALBUMIN 3.4* 3.4* 3.6  AST _1 ALT _2 ALKPHOS 57 57 61  BILITOT 0.7 0.5 0.5    RADIOGRAPHIC STUDIES: I have personally reviewed the radiological images as listed and agreed with the findings in the report.    ASSESSMENT & PLAN:   Primary cancer of right upper lobe of lung ([place]) #Right upper lobe lung adenocarcinoma-STAGE IV  [T4N3M1]-EGFR mutated- Exon 19 del. ON Osimertinib 80 mg once a day [07/31/2020] but currently on Osimetinib 40 mg/day [severe fatigue- since July 2023]; ] OCT 13th, 2023-  Stable size and appearance of right upper lobe mass. No new signs of metastatic disease in the chest, abdomen or pelvis.   # Continue osimertinib at 40 mg/day  no overt progression of disease [see above]; clinicaly STABLE.   # Fatigue: likley sec to osimertinib. Continue  decreased the dose to 40 mg/day.STABLE.   # CKD stage III monitor closely. STABLE.   # INCIDENTAL Left sublingual fullness/ Incidental large thyroid goiter/nodule noted- s/p- tongue mass/thyroid nodule- STABLE. OCT 2023- CT  neck-STABLE.   #Chronic joint pains-continue Celebrex every other day [see below]; Tylenol as needed for pain- Stable  #Diabetes /type II -PBF- BG- 158-continue glipizide to 5 mg XL- Stable; refilled defer to PCP re: GLP-1 agonist.   # weight gain- NO CHF noted; BNP normal limits.  Add lasix 20 mg/day x 10 days.  Hold potassium supplementation.  Today K- 4.1; hold kdur.  The appearance of the lungs is suggestive of developing interstitial lung disease. Outpatient referral to Pulmonology for further clinical evaluation is recommended.  #Incidental findings on Imaging CT scan  OCT 2023: Aortic atherosclerosis, calcifications of the aortic valve;  Hepatic steatosis- I reviewed/discussed/counseled the patient.   # DISPOSITION:   # follow up in 2 months MD; labs- cbc/cmp/mag - -Dr.B        All questions were answered. The patient knows to call the clinic with any problems, questions or concerns.    Cammie Sickle, MD 01/28/2022 3:51 PM

## 2022-01-28 NOTE — Progress Notes (Signed)
Patient denies new problems/concerns today.   °

## 2022-01-28 NOTE — Assessment & Plan Note (Addendum)
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 ON Osimertinib 80 mg once a day [07/31/2020] but currently on Osimetinib 40 mg/day [severe fatigue- since July 2023]; ] OCT 13th, 2023-  Stable size and appearance of right upper lobe mass. No new signs of metastatic disease in the chest, abdomen or pelvis.   # Continue osimertinib at 40 mg/day  no overt progression of disease [see above]; clinicaly STABLE.   # Fatigue: likley sec to osimertinib. Continue  decreased the dose to 40 mg/day.STABLE.   # CKD stage III monitor closely. STABLE.   # INCIDENTAL Left sublingual fullness/ Incidental large thyroid goiter/nodule noted- s/p- tongue mass/thyroid nodule- STABLE. OCT 2023- CT neck-STABLE.   #Chronic joint pains-continue Celebrex every other day [see below]; Tylenol as needed for pain- Stable  #Diabetes /type II -PBF- BG- 158-continue glipizide to 5 mg XL- Stable; refilled defer to PCP re: GLP-1 agonist.   # weight gain- NO CHF noted; BNP normal limits.  Add lasix 20 mg/day x 10 days.  Hold potassium supplementation.  Today K- 4.1; hold kdur.  The appearance of the lungs is suggestive of developing interstitial lung disease. Outpatient referral to Pulmonology for further clinical evaluation is recommended.  #Incidental findings on Imaging CT scan  OCT 2023: Aortic atherosclerosis, calcifications of the aortic valve;  Hepatic steatosis- I reviewed/discussed/counseled the patient.   # DISPOSITION:   # follow up in 2 months MD; labs- cbc/cmp/mag - -Dr.B

## 2022-01-30 ENCOUNTER — Encounter: Payer: Self-pay | Admitting: Internal Medicine

## 2022-01-30 ENCOUNTER — Ambulatory Visit (INDEPENDENT_AMBULATORY_CARE_PROVIDER_SITE_OTHER): Payer: Medicare Other | Admitting: Internal Medicine

## 2022-01-30 VITALS — BP 128/78 | HR 70 | Ht 64.0 in | Wt 309.0 lb

## 2022-01-30 DIAGNOSIS — C770 Secondary and unspecified malignant neoplasm of lymph nodes of head, face and neck: Secondary | ICD-10-CM | POA: Diagnosis not present

## 2022-01-30 DIAGNOSIS — J849 Interstitial pulmonary disease, unspecified: Secondary | ICD-10-CM | POA: Diagnosis not present

## 2022-01-30 DIAGNOSIS — D702 Other drug-induced agranulocytosis: Secondary | ICD-10-CM | POA: Diagnosis not present

## 2022-01-30 DIAGNOSIS — E118 Type 2 diabetes mellitus with unspecified complications: Secondary | ICD-10-CM | POA: Diagnosis not present

## 2022-01-30 MED ORDER — TIRZEPATIDE 2.5 MG/0.5ML ~~LOC~~ SOAJ: 2.5000 mg | SUBCUTANEOUS | 0 refills | Status: DC

## 2022-01-30 NOTE — Progress Notes (Signed)
Date:  01/30/2022   Name:  Megan Shields   DOB:  1945/09/13   MRN:  [id number]   Chief Complaint: Diabetes (Wants mounjauro, was taking glipizide says it made her really hungry, pt said her BS isn't good, BS 135 in the mornings)  Diabetes She presents for her follow-up diabetic visit. She has type 2 diabetes mellitus. Her disease course has been worsening. Hypoglycemia symptoms include hunger and sweats. Pertinent negatives for hypoglycemia include no nervousness/anxiousness. Pertinent negatives for diabetes include no chest pain and no fatigue. There are no hypoglycemic complications. Symptoms are stable. Current diabetic treatments: glipzide; diarrhea from metformin in the past. Her weight is stable. Her breakfast blood glucose is taken between 7-8 am. Her breakfast blood glucose range is generally 140-180 mg/dl.  Her daughter is on [place] and doing well, lost 35 lbs so far.  She is interested in trying this. We discussed formulary issues.  It looks like Trulicity is preferred but will try to get [facility]  Lab Results  Component Value Date   NA 137 01/28/2022   K 4.3 01/28/2022   CO2 30 01/28/2022   GLUCOSE 135 (H) 01/28/2022   BUN 19 01/28/2022   CREATININE 1.00 01/28/2022   CALCIUM 9.4 01/28/2022   GFRNONAA 59 (L) 01/28/2022   Lab Results  Component Value Date   CHOL 192 11/26/2021   HDL 75 11/26/2021   LDLCALC 98 11/26/2021   TRIG 107 11/26/2021   CHOLHDL 2.6 11/26/2021   Lab Results  Component Value Date   TSH 1.680 10/06/2019   Lab Results  Component Value Date   HGBA1C 7.9 (H) 11/26/2021   Lab Results  Component Value Date   WBC 5.8 01/28/2022   HGB 13.9 01/28/2022   HCT 43.2 01/28/2022   MCV 85.0 01/28/2022   PLT 191 01/28/2022   Lab Results  Component Value Date   ALT 14 01/28/2022   AST 21 01/28/2022   ALKPHOS 61 01/28/2022   BILITOT 0.5 01/28/2022   Lab Results  Component Value Date   VD25OH 34.9 03/07/2020     Review of Systems   Constitutional:  Negative for chills, fatigue and fever.  Eyes:  Negative for visual disturbance.  Respiratory:  Negative for chest tightness and shortness of breath.   Cardiovascular:  Negative for chest pain.  Psychiatric/Behavioral:  Negative for dysphoric mood and sleep disturbance. The patient is not nervous/anxious.     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   Vitamin D deficiency 10/06/2019   Rosacea 04/08/2019   Hyperlipidemia associated with type 2 diabetes mellitus ([place]) 04/08/2019   Vertigo 04/07/2018   Tubular adenoma of colon 03/10/2017   Tinea pedis of left foot 11/22/2016   Type II diabetes mellitus with complication ([place]) 74/25/9563   Essential (primary) hypertension 08/01/2014   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 Tears off skin. blisters   Amoxicillin Rash   Codeine  Other reaction(s): drowsiness   Penicillins Rash    Past Surgical History:  Procedure Laterality Date   BLEPHAROPLASTY Bilateral 12/19/2017   CARPAL TUNNEL RELEASE Bilateral 1985   CHOLECYSTECTOMY     COLONOSCOPY WITH PROPOFOL N/A 03/07/2017   Procedure: COLONOSCOPY WITH PROPOFOL;  Surgeon: [name], MD;  Location: [facility] ENDOSCOPY;  Service: Gastroenterology;  Laterality: N/A;   DILATION AND CURETTAGE OF UTERUS  12/2011   benign endometrial polyp   HALLUX VALGUS AUSTIN Right 09/02/2014    Procedure: [place];  Surgeon: Samara Deist, DPM;  Location: [facility] ORS;  Service: Podiatry;  Laterality: Right;   HERNIA REPAIR     JOINT REPLACEMENT Bilateral    TKR   LAPAROSCOPIC GASTRIC BANDING  2010   Lap Band   LAPAROSCOPIC TOTAL HYSTERECTOMY  12/23/2019   lymph node removed Right    TOTAL KNEE ARTHROPLASTY Bilateral 2003, 2004   TOTAL KNEE ARTHROPLASTY Bilateral    VENTRAL HERNIA REPAIR  2008    Social History   Tobacco Use   Smoking status: Never   Smokeless tobacco: Never  Vaping Use   Vaping Use: Never used  Substance Use Topics   Alcohol use: No    Alcohol/week: 0.0 standard drinks of alcohol   Drug use: No     Medication list has been reviewed and updated.  Current Meds  Medication Sig   acetaminophen (TYLENOL) 500 MG tablet Take 1,000 mg by mouth at bedtime as needed for moderate pain.   celecoxib (CELEBREX) 200 MG capsule TAKE 1 CAPSULE DAILY   cholecalciferol (VITAMIN D3) 25 MCG (1000 UNIT) tablet Take 1,000 Units by mouth daily.   cyclobenzaprine (FLEXERIL) 10 MG tablet Take 1 tablet (10 mg total) by mouth at bedtime.   FREESTYLE LITE test strip TEST BLOOD SUGAR TWICE A DAY   glipiZIDE (GLUCOTROL XL) 5 MG 24 hr tablet Take 1 tablet (5 mg total) by mouth daily with breakfast.   Lancets (FREESTYLE) lancets TEST BLOOD SUGAR TWICE A DAY   meclizine (ANTIVERT) 25 MG tablet 1 pill at night as needed; if dizziness not improved can take one pill every 8 hours as needed/tolerated.   olmesartan-hydrochlorothiazide (BENICAR HCT) 40-25 MG tablet TAKE 1 TABLET DAILY   ondansetron (ZOFRAN) 8 MG tablet One pill every 8 hours as needed for nausea/vomitting.   osimertinib mesylate (TAGRISSO) 40 MG tablet Take 1 tablet (40 mg total) by mouth daily.   prochlorperazine (COMPAZINE) 10 MG tablet Take 1 tablet (10 mg total) by mouth every 6 (six) hours as needed for nausea or vomiting.   tirzepatide (MOUNJARO) 2.5 MG/0.5ML Pen Inject 2.5 mg into the skin once a week.        01/30/2022   11:16 AM 06/08/2021    9:35 AM 02/07/2021    9:42 AM 10/09/2020   10:11 AM  GAD 7 : Generalized Anxiety Score  Nervous, Anxious, on Edge 0 0 0 0  Control/stop worrying 0 0 0 0  Worry too much - different things 0 0 1 0  Trouble relaxing 0 0 0 0  Restless 0 0 0 0  Easily annoyed or irritable 0 0 0 0  Afraid - awful might happen 0 0 0 0  Total GAD 7 Score 0 0 1 0  Anxiety Difficulty Not difficult at all Not difficult at all Not difficult at all        01/30/2022   11:16 AM 06/08/2021    9:35 AM 02/07/2021    9:42 AM  Depression screen  PHQ 2/9  Decreased Interest 1 0 0  Down, Depressed, Hopeless 0 0 0  PHQ - 2 Score 1 0 0  Altered sleeping 1 0 2  Tired, decreased energy 3 3 3   Change in appetite 3 2 1   Feeling bad or failure about yourself  0 0 0  Trouble concentrating 0 0 0  Moving slowly or fidgety/restless 0 0 0  Suicidal thoughts 0 0 0  PHQ-9 Score 8 5 6   Difficult doing work/chores Not difficult at all Not difficult at all Not difficult at all    BP Readings from Last 3 Encounters:  01/30/22 128/78  01/28/22 (!) 163/82  11/26/21 124/78    Physical Exam Vitals and nursing note reviewed.  Constitutional:      General: She is not in acute distress.    Appearance: She is well-developed.  HENT:     Head: Normocephalic and atraumatic.  Cardiovascular:     Rate and Rhythm: Normal rate and regular rhythm.  Pulmonary:     Effort: Pulmonary effort is normal. No respiratory distress.  Skin:    General: Skin is warm and dry.     Findings: No rash.  Neurological:     Mental Status: She is alert and oriented to person, place, and time.  Psychiatric:        Mood and Affect: Mood normal.        Behavior: Behavior normal.     Wt Readings from Last 3 Encounters:  01/30/22 (!) 309 lb (140.2 kg)  01/28/22 (!) 309 lb 14.4 oz (140.6 kg)  11/26/21 (!) 305 lb (138.3 kg)    BP 128/78 (BP Location: Right Arm, Cuff Size: Large)   Pulse 70   Ht 5\' 4"   (1.626 m)   Wt (!) 309 lb (140.2 kg)   SpO2 96%   BMI 53.04 kg/m   Assessment and Plan: 1. Type II diabetes mellitus with complication (HCC) Intolerant of metformin;  hypoglycemia with glipizide and uncontrolled BS Will try to get [facility] - if not approved, would recommend Trulicity. - tirzepatide [facility]) 2.5 MG/0.5ML Pen; Inject 2.5 mg into the skin once a week.  Dispense: 2 mL; Refill: 0  2. 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 ILD (interstitial lung disease) ([place]) stable  4. Drug-induced neutropenia ([place]) Monitored by Oncology.   Partially dictated using Editor, commissioning. Any errors are unintentional.  Halina Maidens, MD [facility]  01/30/2022

## 2022-01-31 ENCOUNTER — Other Ambulatory Visit: Payer: Self-pay | Admitting: Internal Medicine

## 2022-01-31 DIAGNOSIS — I10 Essential (primary) hypertension: Secondary | ICD-10-CM

## 2022-02-07 ENCOUNTER — Encounter: Payer: Self-pay | Admitting: Internal Medicine

## 2022-02-20 ENCOUNTER — Telehealth: Payer: Self-pay

## 2022-02-20 NOTE — Telephone Encounter (Signed)
(  Key: [id number])  Completed PA on [email or web address] for [facility] 2ML per 30 days. PA was APPROVED! ;Coverage Start Date:01/21/2022;Coverage End Date:02/24/2098;  Patient informed.

## 2022-03-06 ENCOUNTER — Other Ambulatory Visit: Payer: Self-pay | Admitting: Internal Medicine

## 2022-03-06 DIAGNOSIS — E118 Type 2 diabetes mellitus with unspecified complications: Secondary | ICD-10-CM

## 2022-03-07 NOTE — Telephone Encounter (Signed)
Requested medication (s) are due for refill today:   Yes  Requested medication (s) are on the active medication list:   Yes  Future visit scheduled:   Yes   Last ordered: 01/30/2022 2 ml, 0 refills  Returned because no protocol assigned to this medication.   Requested Prescriptions  Pending Prescriptions Disp Refills   MOUNJARO 2.5 MG/0.5ML Pen [Pharmacy Med Name: MOUNJARO PEN 0.5ML 4'S 2.5MG ] 2 mL 12    Sig: INJECT 2.5 MG UNDER THE SKIN WEEKLY     Off-Protocol Failed - 03/06/2022  7:15 PM      Failed - Medication not assigned to a protocol, review manually.      Passed - Valid encounter within last 12 months    Recent Outpatient Visits           1 month ago Type II diabetes mellitus with complication ([place])   [facility] [facility] at [facility], Jesse Sans, MD   3 months ago Type II diabetes mellitus with complication [facility])   [place] [facility] at [facility], Jesse Sans, MD   9 months ago Type II diabetes mellitus with complication [facility])   [place] [facility] at [facility], Jesse Sans, MD   1 year ago Type II diabetes mellitus with complication [facility])   [place] [facility] at [facility], Jesse Sans, MD   1 year ago Essential (primary) hypertension   [place] [facility] at [facility], Jesse Sans, MD       Future Appointments             In 3 months Army Melia, Jesse Sans, MD [place] [facility] at [facility], [facility]

## 2022-03-27 ENCOUNTER — Ambulatory Visit: Payer: Medicare Other | Admitting: Internal Medicine

## 2022-04-01 ENCOUNTER — Inpatient Hospital Stay: Payer: Medicare Other | Attending: Internal Medicine

## 2022-04-01 ENCOUNTER — Encounter: Payer: Self-pay | Admitting: Internal Medicine

## 2022-04-01 ENCOUNTER — Inpatient Hospital Stay (HOSPITAL_BASED_OUTPATIENT_CLINIC_OR_DEPARTMENT_OTHER): Payer: Medicare Other | Admitting: Internal Medicine

## 2022-04-01 VITALS — BP 158/52 | HR 75 | Temp 98.4°F | Resp 18 | Wt 300.7 lb

## 2022-04-01 DIAGNOSIS — I129 Hypertensive chronic kidney disease with stage 1 through stage 4 chronic kidney disease, or unspecified chronic kidney disease: Secondary | ICD-10-CM | POA: Insufficient documentation

## 2022-04-01 DIAGNOSIS — Z79899 Other long term (current) drug therapy: Secondary | ICD-10-CM | POA: Diagnosis not present

## 2022-04-01 DIAGNOSIS — C3411 Malignant neoplasm of upper lobe, right bronchus or lung: Secondary | ICD-10-CM | POA: Insufficient documentation

## 2022-04-01 DIAGNOSIS — E1122 Type 2 diabetes mellitus with diabetic chronic kidney disease: Secondary | ICD-10-CM | POA: Diagnosis not present

## 2022-04-01 DIAGNOSIS — N183 Chronic kidney disease, stage 3 unspecified: Secondary | ICD-10-CM | POA: Diagnosis not present

## 2022-04-01 LAB — COMPREHENSIVE METABOLIC PANEL
ALT: 14 U/L (ref 0–44)
AST: 21 U/L (ref 15–41)
Albumin: 3.7 g/dL (ref 3.5–5.0)
Alkaline Phosphatase: 53 U/L (ref 38–126)
Anion gap: 9 (ref 5–15)
BUN: 23 mg/dL (ref 8–23)
CO2: 28 mmol/L (ref 22–32)
Calcium: 9.8 mg/dL (ref 8.9–10.3)
Chloride: 99 mmol/L (ref 98–111)
Creatinine, Ser: 1.09 mg/dL — ABNORMAL HIGH (ref 0.44–1.00)
GFR, Estimated: 53 mL/min — ABNORMAL LOW (ref >60)
Glucose, Bld: 147 mg/dL — ABNORMAL HIGH (ref 70–99)
Potassium: 4.3 mmol/L (ref 3.5–5.1)
Sodium: 136 mmol/L (ref 135–145)
Total Bilirubin: 0.4 mg/dL (ref 0.3–1.2)
Total Protein: 7.8 g/dL (ref 6.5–8.1)

## 2022-04-01 LAB — CBC WITH DIFFERENTIAL/PLATELET
Abs Immature Granulocytes: 0.01 10*3/uL (ref 0.00–0.07)
Basophils Absolute: 0 10*3/uL (ref 0.0–0.1)
Basophils Relative: 1 %
Eosinophils Absolute: 0.2 10*3/uL (ref 0.0–0.5)
Eosinophils Relative: 5 %
HCT: 45.7 % (ref 36.0–46.0)
Hemoglobin: 14.8 g/dL (ref 12.0–15.0)
Immature Granulocytes: 0 %
Lymphocytes Relative: 21 %
Lymphs Abs: 0.9 10*3/uL (ref 0.7–4.0)
MCH: 27.5 pg (ref 26.0–34.0)
MCHC: 32.4 g/dL (ref 30.0–36.0)
MCV: 84.9 fL (ref 80.0–100.0)
Monocytes Absolute: 0.6 10*3/uL (ref 0.1–1.0)
Monocytes Relative: 13 %
Neutro Abs: 2.5 10*3/uL (ref 1.7–7.7)
Neutrophils Relative %: 60 %
Platelets: 173 10*3/uL (ref 150–400)
RBC: 5.38 MIL/uL — ABNORMAL HIGH (ref 3.87–5.11)
RDW: 13.9 % (ref 11.5–15.5)
WBC: 4.2 10*3/uL (ref 4.0–10.5)
nRBC: 0 % (ref 0.0–0.2)

## 2022-04-01 LAB — MAGNESIUM: Magnesium: 1.7 mg/dL (ref 1.7–2.4)

## 2022-04-01 NOTE — Progress Notes (Signed)
Pt has some dyspnea. Denies any chest pain. Energy is fair, lower at times. Pace her ADL's. Wondering if she can proceed with cataract surgery.

## 2022-04-01 NOTE — Assessment & Plan Note (Signed)
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 ON Osimertinib 80 mg once a day [07/31/2020] but currently on Osimetinib 40 mg/day [severe fatigue- since July 2023]; ] OCT 13th, 2023-  Stable size and appearance of right upper lobe mass. No new signs of metastatic disease in the chest, abdomen or pelvis.   # Continue osimertinib at 40 mg/day  no overt progression of disease [see above]; clinicaly Stable.  Will order CT scan in about 2 months.  Ordered today.   # Fatigue: likley sec to osimertinib. Continue  decreased the dose to 40 mg/day.Stable.   # CKD stage III monitor closely. Stable.   # INCIDENTAL Left sublingual fullness/ Incidental large thyroid goiter/nodule noted- s/p- tongue mass/thyroid nodule- OCT 2023- CT neck-Stable.   #Chronic joint pains-continue Celebrex every other day [see below]; Tylenol as needed for pain- Stable.   #Diabetes /type II -PBF- BG- 144; currently on Munjauro/ GLP-1 agonist. Stable.   # DISPOSITION:   # follow up in 2 months MD; labs- cbc/cmp/mag; CT CAP- -Dr.B

## 2022-04-01 NOTE — Progress Notes (Signed)
[facility] NOTE  Patient Care Team: Glean Hess, MD as PCP - General (Internal Medicine) [facility] (Ophthalmology) Ree Edman, MD (Dermatology) Telford Nab, RN as Oncology Nurse Navigator Cammie Sickle, MD as Consulting Physician (Oncology) Leandrew Koyanagi, MD as Referring Physician (Ophthalmology)  CHIEF COMPLAINTS/PURPOSE OF CONSULTATION: lung cancer  #  Oncology History Overview Note  # April-MAY 2022- 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  [Dr.Byrnett & Dr.Fleming]; May 2022-MRI brain-NEG.   # MAY 11th, 2022- carbo-alimta x1 cycle; discontinued; June 6th, 2022-osimertinib 80 mg a day  #Thyroid -enlargement; recommend biopsy per radiology; patient /husband has been reluctant  # Incidental-out of place Gastric band [since 2014] gastric band.  Asymptomatic monitor for now  # NGS/MOLECULAR TESTS:POSITIVE for EGFR mutation deletion 19    # PALLIATIVE CARE EVALUATION:  # PAIN MANAGEMENT:    DIAGNOSIS: Lung cancer  STAGE:   IIIC      ;  GOALS:Palliatve  CURRENT/MOST RECENT THERAPY : Osiemertinib      Primary cancer of right upper lobe of lung ([place])  06/30/2020 Initial Diagnosis   Primary cancer of right upper lobe of lung ([place])   06/30/2020 Cancer Staging   Staging form: Lung, AJCC 8th Edition - Clinical: Stage IIIC (cT4, cN3, cM0) - Signed by Cammie Sickle, MD on 06/30/2020 Histopathologic type: Adenocarcinoma, NOS   07/05/2020 - 07/05/2020 Chemotherapy   Patient is on Treatment Plan : LUNG CARBOplatin / Pemetrexed / Pembrolizumab q21d Induction x 4 cycles / Maintenance Pemetrexed + Pembrolizumab      HISTORY OF PRESENTING ILLNESS:  She is accompanied by husband.  Ambulating independently.   Megan Shields 77 y.o.  female  Stage IV/adenocarcinoma the lung EGFR mutated-currently on osimertinib is here for  follow-up.  Pt has some dyspnea.  Otherwise denies any worsening shortness of breath or cough.  Admits to compliance with her Tagrisso.    Denies any chest pain. Energy is fair, lower at times. Pace her ADL's. Wondering if she can proceed with cataract surgery.  Mild chronic Swelling in the legs. No nausea no vomiting .  No diarrhea.  Chronic joint pains.   Review of Systems  Constitutional:  Positive for malaise/fatigue. Negative for chills, diaphoresis, fever and weight loss.  HENT:  Negative for nosebleeds and sore throat.   Eyes:  Negative for double vision.  Respiratory:  Negative for hemoptysis, sputum production and wheezing.   Cardiovascular:  Negative for chest pain, palpitations, orthopnea and leg swelling.  Gastrointestinal:  Negative for abdominal pain, blood in stool, constipation, diarrhea, heartburn, melena, nausea and vomiting.  Genitourinary:  Negative for dysuria, frequency and urgency.  Musculoskeletal:  Negative for back pain and joint pain.  Neurological:  Negative for dizziness, tingling, focal weakness, weakness and headaches.  Endo/Heme/Allergies:  Does not bruise/bleed easily.  Psychiatric/Behavioral:  Negative for depression. The patient is not nervous/anxious and does not have insomnia.      MEDICAL HISTORY:  Past Medical History:  Diagnosis Date   Anemia    vitamin d deficiency   Arthritis    Chronic cough    Depression    Diabetes mellitus without complication ([place])    Dyspnea    Hypertension    ILD (interstitial lung disease) ([place])    Obesity    Obesity    BMI 49.44   Pneumonia    Pneumonia due to 2019 novel coronavirus 2022   community acquired also. covid positive  May 2022   PONV (postoperative nausea and vomiting)    Primary cancer of right upper lobe of lung ([place]) 06/30/2020   Rosacea    Shoulder contusion 11/22/2015    SURGICAL HISTORY: Past Surgical History:  Procedure Laterality Date   BLEPHAROPLASTY Bilateral 12/19/2017   CARPAL  TUNNEL RELEASE Bilateral 1985   CHOLECYSTECTOMY     COLONOSCOPY WITH PROPOFOL N/A 03/07/2017   Procedure: COLONOSCOPY WITH PROPOFOL;  Surgeon: [name], MD;  Location: [facility] ENDOSCOPY;  Service: Gastroenterology;  Laterality: N/A;   DILATION AND CURETTAGE OF UTERUS  12/2011   benign endometrial polyp   HALLUX VALGUS AUSTIN Right 09/02/2014   Procedure: [place];  Surgeon: Samara Deist, DPM;  Location: [facility] ORS;  Service: Podiatry;  Laterality: Right;   HERNIA REPAIR     JOINT REPLACEMENT Bilateral    TKR   LAPAROSCOPIC GASTRIC BANDING  2010   Lap Band   LAPAROSCOPIC TOTAL HYSTERECTOMY  12/23/2019   lymph node removed Right    TOTAL KNEE ARTHROPLASTY Bilateral 2003, 2004   TOTAL KNEE ARTHROPLASTY Bilateral    VENTRAL HERNIA REPAIR  2008    SOCIAL HISTORY: Social History   Socioeconomic History   Marital status: Married    Spouse name: Fritz Pickerel   Number of children: Not on file   Years of education: Not on file   Highest education level: Not on file  Occupational History   Occupation: Probation officer    Comment: RETIRED  Tobacco Use   Smoking status: Never   Smokeless tobacco: Never  Vaping Use   Vaping Use: Never used  Substance and Sexual Activity   Alcohol use: No    Alcohol/week: 0.0 standard drinks of alcohol   Drug use: No   Sexual activity: Not Currently  Other Topics Concern   Not on file  Social History Narrative   Never smoked; no alcohol. Lives in [place] with husband . Home maker.    Daughter lives next door. 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   Social Determinants of Health   Financial Resource Strain: Low Risk  (11/22/2019)   Overall Financial Resource Strain (CARDIA)    Difficulty of Paying Living Expenses: Not hard at all  Food Insecurity: No Food Insecurity (11/21/2021)   Hunger Vital Sign    Worried About Running Out of Food in the Last Year: Never true    Ran Out of Food in the Last Year: Never true  Transportation Needs: No  Transportation Needs (11/21/2021)   PRAPARE - Hydrologist (Medical): No    Lack of Transportation (Non-Medical): No  Physical Activity: Insufficiently Active (11/22/2019)   Exercise Vital Sign    Days of Exercise per Week: 3 days    Minutes of Exercise per Session: 30 min  Stress: No Stress Concern Present (11/22/2019)   [place]    Feeling of Stress : Not at all  Social Connections: Moderately Isolated (11/22/2019)   Social Connection and Isolation Panel [NHANES]    Frequency of Communication with Friends and Family: More than three times a week    Frequency of Social Gatherings with Friends and Family: More than three times a week    Attends Religious Services: Never    Marine scientist or Organizations: No    Attends Archivist Meetings: Never    Marital Status: Married  Human resources officer Violence: Not At Risk (11/22/2019)   Humiliation, Afraid, Rape,  and Kick questionnaire    Fear of Current or Ex-Partner: No    Emotionally Abused: No    Physically Abused: No    Sexually Abused: No    FAMILY HISTORY: Family History  Problem Relation Age of Onset   Breast cancer Mother 74   Breast cancer Paternal Aunt        3 pat aunts   Liver disease Brother        died 37    ALLERGIES:  is allergic to pneumococcal vaccines, wound dressing adhesive, amoxicillin, codeine, and penicillins.  MEDICATIONS:  Current Outpatient Medications  Medication Sig Dispense Refill   acetaminophen (TYLENOL) 500 MG tablet Take 1,000 mg by mouth at bedtime as needed for moderate pain.     celecoxib (CELEBREX) 200 MG capsule TAKE 1 CAPSULE DAILY 90 capsule 3   cholecalciferol (VITAMIN D3) 25 MCG (1000 UNIT) tablet Take 1,000 Units by mouth daily.     cyclobenzaprine (FLEXERIL) 10 MG tablet Take 1 tablet (10 mg total) by mouth at bedtime. 30 tablet 1   FREESTYLE LITE test strip TEST BLOOD SUGAR  TWICE A DAY 100 strip 6   Lancets (FREESTYLE) lancets TEST BLOOD SUGAR TWICE A DAY 100 each 6   meclizine (ANTIVERT) 25 MG tablet 1 pill at night as needed; if dizziness not improved can take one pill every 8 hours as needed/tolerated. 30 tablet 0   olmesartan-hydrochlorothiazide (BENICAR HCT) 40-25 MG tablet TAKE 1 TABLET DAILY 90 tablet 1   prochlorperazine (COMPAZINE) 10 MG tablet Take 1 tablet (10 mg total) by mouth every 6 (six) hours as needed for nausea or vomiting. 40 tablet 1   tirzepatide (MOUNJARO) 2.5 MG/0.5ML Pen INJECT 2.5 MG UNDER THE SKIN WEEKLY 2 mL 1   ondansetron (ZOFRAN) 8 MG tablet One pill every 8 hours as needed for nausea/vomitting. 40 tablet 1   osimertinib mesylate (TAGRISSO) 40 MG tablet Take 1 tablet (40 mg total) by mouth daily. 30 tablet 3   No current facility-administered medications for this visit.    Mild to moderate erythema of the left big toe/nail bed.  Nail intact  PHYSICAL EXAMINATION: ECOG PERFORMANCE STATUS: 0 - Asymptomatic  Vitals:   04/01/22 1000  BP: (!) 158/52  Pulse: 75  Resp: 18  Temp: 98.4 F (36.9 C)  SpO2: 99%    Filed Weights   04/01/22 1000  Weight: (!) 300 lb 11.2 oz (136.4 kg)      Physical Exam Constitutional:      Comments: Obese.    HENT:     Head: Normocephalic and atraumatic.     Mouth/Throat:     Pharynx: No oropharyngeal exudate.  Eyes:     Pupils: Pupils are equal, round, and reactive to light.  Cardiovascular:     Rate and Rhythm: Normal rate and regular rhythm.  Pulmonary:     Effort: No respiratory distress.     Breath sounds: No wheezing.     Comments: Clear to auscultation bilaterally. Abdominal:     General: Bowel sounds are normal. There is no distension.     Palpations: Abdomen is soft. There is no mass.     Tenderness: There is no abdominal tenderness. There is no guarding or rebound.  Musculoskeletal:        General: No tenderness. Normal range of motion.     Cervical back: Normal range of  motion and neck supple.  Skin:    General: Skin is warm.  Neurological:     Mental Status:  She is alert and oriented to person, place, and time.  Psychiatric:        Mood and Affect: Affect normal.    Erythema of the ball of the left big toe.  No discharge noted.  LABORATORY DATA:  I have reviewed the data as listed Lab Results  Component Value Date   WBC 4.2 04/01/2022   HGB 14.8 04/01/2022   HCT 45.7 04/01/2022   MCV 84.9 04/01/2022   PLT 173 04/01/2022   Recent Labs    11/22/21 1033 01/28/22 1435 04/01/22 0936  NA 136 137 136  K 4.3 4.3 4.3  CL 99 98 99  CO2 32 30 28  GLUCOSE 156* 135* 147*  BUN 17 19 23   CREATININE 0.93 1.00 1.09*  CALCIUM 9.0 9.4 9.8  GFRNONAA >60 59* 53*  PROT 7.4 7.9 7.8  ALBUMIN 3.4* 3.6 3.7  AST 18 21 21   ALT 12 14 14   ALKPHOS 57 61 53  BILITOT 0.5 0.5 0.4    RADIOGRAPHIC STUDIES: I have personally reviewed the radiological images as listed and agreed with the findings in the report.    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 ON Osimertinib 80 mg once a day [07/31/2020] but currently on Osimetinib 40 mg/day [severe fatigue- since July 2023]; ] OCT 13th, 2023-  Stable size and appearance of right upper lobe mass. No new signs of metastatic disease in the chest, abdomen or pelvis.   # Continue osimertinib at 40 mg/day  no overt progression of disease [see above]; clinicaly Stable.  Will order CT scan in about 2 months.  Ordered today.   # Fatigue: likley sec to osimertinib. Continue  decreased the dose to 40 mg/day.Stable.   # CKD stage III monitor closely. Stable.   # INCIDENTAL Left sublingual fullness/ Incidental large thyroid goiter/nodule noted- s/p- tongue mass/thyroid nodule- OCT 2023- CT neck-Stable.   #Chronic joint pains-continue Celebrex every other day [see below]; Tylenol as needed for pain- Stable.    #Diabetes /type II -PBF- BG- 144; currently on Munjauro/ GLP-1 agonist. Stable.   # DISPOSITION:   # follow up in 2 months MD; labs- cbc/cmp/mag; CT CAP- -Dr.B     All questions were answered. The patient knows to call the clinic with any problems, questions or concerns.    Cammie Sickle, MD 04/01/2022 10:48 AM

## 2022-04-01 NOTE — Addendum Note (Signed)
Addended by: Sofie Rower A on: 04/01/2022 10:52 AM   Modules accepted: Orders

## 2022-04-09 ENCOUNTER — Other Ambulatory Visit: Payer: Self-pay | Admitting: Internal Medicine

## 2022-04-09 DIAGNOSIS — C3411 Malignant neoplasm of upper lobe, right bronchus or lung: Secondary | ICD-10-CM

## 2022-04-09 NOTE — Telephone Encounter (Signed)
CBC with Differential/Platelet Order: [id number] Status: Final result     Visible to patient: Yes (seen)     Next appt: 05/31/2022 at 11:00 AM in Radiology ([facility])     Dx: Primary cancer of right upper lobe of...   0 Result Notes           Component Ref Range & Units 8 d ago 2 mo ago 4 mo ago 6 mo ago 7 mo ago 8 mo ago 10 mo ago  WBC 4.0 - 10.5 K/uL 4.2 5.8 4.6 4.4 3.3 Low  4.5 3.9 Low   RBC 3.87 - 5.11 MIL/uL 5.38 High  5.08 4.82 4.70 4.91 4.90 5.27 High   Hemoglobin 12.0 - 15.0 g/dL 14.8 13.9 13.2 12.9 13.4 13.0 14.3  HCT 36.0 - 46.0 % 45.7 43.2 41.3 41.0 42.1 42.0 45.0  MCV 80.0 - 100.0 fL 84.9 85.0 85.7 87.2 85.7 85.7 85.4  MCH 26.0 - 34.0 pg 27.5 27.4 27.4 27.4 27.3 26.5 27.1  MCHC 30.0 - 36.0 g/dL 32.4 32.2 32.0 31.5 31.8 31.0 31.8  RDW 11.5 - 15.5 % 13.9 13.8 13.9 14.0 14.1 13.9 14.1  Platelets 150 - 400 K/uL 173 191 178 170 155 157 171  nRBC 0.0 - 0.2 % 0.0 0.0 0.0 0.0 0.0 0.0 0.0  Neutrophils Relative % % 60 61 63 62 60 63 61  Neutro Abs 1.7 - 7.7 K/uL 2.5 3.6 2.9 2.8 2.0 2.8 2.4  Lymphocytes Relative % 21 22 20 20 24 20 20   Lymphs Abs 0.7 - 4.0 K/uL 0.9 1.3 0.9 0.9 0.8 0.9 0.8  Monocytes Relative % 13 11 12 12 10 11 13   Monocytes Absolute 0.1 - 1.0 K/uL 0.6 0.7 0.5 0.5 0.3 0.5 0.5  Eosinophils Relative % 5 5 5 4 5 4 5   Eosinophils Absolute 0.0 - 0.5 K/uL 0.2 0.3 0.2 0.2 0.2 0.2 0.2  Basophils Relative % 1 1 0 1 1 0 1  Basophils Absolute 0.0 - 0.1 K/uL 0.0 0.0 0.0 0.0 0.0 0.0 0.0  Immature Granulocytes % 0 0 0 1 0 2 0  Abs Immature Granulocytes 0.00 - 0.07 K/uL 0.01 0.01 CM 0.02 CM 0.03 CM 0.00 CM 0.07 CM 0.01 CM  Comment: Performed at [facility], [address] [place], [place] [address]  Resulting Agency  [place] [facility] [place] [facility] [place] [facility] [place] [facility] [place] [facility] [place] [facility] [place] [facility]         Specimen Collected: 04/01/22 09:36 Last Resulted: 04/01/22 09:44      Lab Flowsheet      Order Details      View Encounter      Lab and Collection Details       Routing      Result History    View All Conversations on this Encounter      CM=Additional comments      Result Care Coordination   Patient Communication   Add Comments   Seen Back to Top      Other Results from 04/01/2022  Magnesium Order: [id number] Status: Final result      Visible to patient: Yes (seen)      Next appt: 05/31/2022 at 11:00 AM in Radiology ([facility])      Dx: Primary cancer of right upper lobe of...    0 Result Notes             Component Ref Range & Units 8 d ago 2 mo ago 4 mo  ago 6 mo ago 7 mo ago 8 mo ago 10 mo ago  Magnesium 1.7 - 2.4 mg/dL 1.7 1.7 CM 1.7 CM 1.8 CM 1.9 CM 1.7 CM 1.7 CM  Comment: Performed at [facility], [address] [place], [place] [address]  [place] [facility] [place] [facility] [place] [facility] [place] [facility] [place] [facility] [place] [facility] [place] [facility]         Specimen Collected: 04/01/22 09:36 Last Resulted: 04/01/22 10:23      Lab Flowsheet       Order Details       View Encounter       Lab and Collection Details       Routing       Result History     View All Conversations on this Encounter      CM=Additional comments      Result Care Coordination   Patient Communication   Add Comments   Seen Back to Top         Contains abnormal data Comprehensive metabolic panel Order: [id number] Status: Final result      Visible to patient: Yes (seen)      Next appt: 05/31/2022 at 11:00 AM in Radiology ([facility])      Dx: Primary cancer of right upper lobe of...    0 Result Notes      1 HM Topic             Component Ref Range & Units 8 d ago 2 mo ago 4 mo ago 6 mo ago 7 mo ago 8 mo ago 10 mo ago  Sodium 135 - 145 mmol/L 136 137 136 137 132 Low  134 Low  133 Low   Potassium 3.5 - 5.1 mmol/L 4.3 4.3 4.3 4.1 4.2 4.1 4.0  Chloride 98 - 111 mmol/L 99 98 99 98 97 Low  95 Low  97 Low   CO2 22 - 32 mmol/L 28 30 32 33 High  29 32 30  Glucose, Bld 70 - 99 mg/dL 147 High  135 High  CM 156 High   CM 189 High  CM 184 High  CM 213 High  CM 168 High  CM  Comment: Glucose reference range applies only to samples taken after fasting for at least 8 hours.  BUN 8 - 23 mg/dL 23 19 17 17 19 19 21   Creatinine, Ser 0.44 - 1.00 mg/dL 1.09 High  1.00 0.93 0.91 1.13 High  1.14 High  1.03 High   Calcium 8.9 - 10.3 mg/dL 9.8 9.4 9.0 9.1 8.9 8.9 9.5  Total Protein 6.5 - 8.1 g/dL 7.8 7.9 7.4 7.6 7.5 7.5 7.7  Albumin 3.5 - 5.0 g/dL 3.7 3.6 3.4 Low  3.4 Low  3.4 Low  3.3 Low  3.5  AST 15 - 41 U/L 21 21 18 21 19 19 19   ALT 0 - 44 U/L 14 14 12 13 12 10 11   Alkaline Phosphatase 38 - 126 U/L 53 61 57 57 52 53 52  Total Bilirubin 0.3 - 1.2 mg/dL 0.4 0.5 0.5 0.7 0.6 0.5 0.8  GFR, Estimated >60 mL/min 53 Low  59 Low  CM >60 CM >60 CM 51 Low  CM 51 Low  CM 57 Low  CM  Comment: (NOTE) Calculated using the CKD-EPI Creatinine Equation (2021)  Anion gap 5 - 15 9 9  CM 5 CM 6 CM 6 CM 7 CM 6 CM  Comment: Performed at [facility], [address] [place], [place] [address]  Resulting Agency  [facility] [place] [facility] [place] [facility] [place] [facility] [place] [facility] [place] [facility]         Specimen Collected: 04/01/22 09:36 Last Resulted: 04/01/22 10:23

## 2022-04-25 DIAGNOSIS — H2513 Age-related nuclear cataract, bilateral: Secondary | ICD-10-CM | POA: Diagnosis not present

## 2022-04-25 DIAGNOSIS — H18523 Epithelial (juvenile) corneal dystrophy, bilateral: Secondary | ICD-10-CM | POA: Diagnosis not present

## 2022-04-25 DIAGNOSIS — E119 Type 2 diabetes mellitus without complications: Secondary | ICD-10-CM | POA: Diagnosis not present

## 2022-04-25 DIAGNOSIS — H18513 Endothelial corneal dystrophy, bilateral: Secondary | ICD-10-CM | POA: Diagnosis not present

## 2022-05-13 DIAGNOSIS — H2512 Age-related nuclear cataract, left eye: Secondary | ICD-10-CM | POA: Diagnosis not present

## 2022-05-13 DIAGNOSIS — H2511 Age-related nuclear cataract, right eye: Secondary | ICD-10-CM | POA: Diagnosis not present

## 2022-05-15 ENCOUNTER — Encounter: Payer: Self-pay | Admitting: Ophthalmology

## 2022-05-21 NOTE — Discharge Instructions (Signed)

## 2022-05-22 ENCOUNTER — Encounter
Admission: RE | Disposition: A | Discharge status: Home / Self Care | Payer: Self-pay | Source: Home / Self Care | Attending: Ophthalmology

## 2022-05-22 ENCOUNTER — Encounter: Payer: Self-pay | Admitting: Ophthalmology

## 2022-05-22 ENCOUNTER — Other Ambulatory Visit: Payer: Self-pay

## 2022-05-22 ENCOUNTER — Ambulatory Visit: Payer: Medicare Other | Admitting: Anesthesiology

## 2022-05-22 ENCOUNTER — Ambulatory Visit
Admission: RE | Admit: 2022-05-22 | Discharge: 2022-05-22 | Disposition: A | Discharge status: Home / Self Care | Payer: Medicare Other | Attending: Ophthalmology | Admitting: Ophthalmology

## 2022-05-22 DIAGNOSIS — C801 Malignant (primary) neoplasm, unspecified: Secondary | ICD-10-CM | POA: Diagnosis not present

## 2022-05-22 DIAGNOSIS — Z6841 Body Mass Index (BMI) 40.0 and over, adult: Secondary | ICD-10-CM | POA: Insufficient documentation

## 2022-05-22 DIAGNOSIS — M1990 Unspecified osteoarthritis, unspecified site: Secondary | ICD-10-CM | POA: Diagnosis not present

## 2022-05-22 DIAGNOSIS — Z7984 Long term (current) use of oral hypoglycemic drugs: Secondary | ICD-10-CM | POA: Diagnosis not present

## 2022-05-22 DIAGNOSIS — E119 Type 2 diabetes mellitus without complications: Secondary | ICD-10-CM | POA: Diagnosis not present

## 2022-05-22 DIAGNOSIS — J849 Interstitial pulmonary disease, unspecified: Secondary | ICD-10-CM | POA: Insufficient documentation

## 2022-05-22 DIAGNOSIS — F32A Depression, unspecified: Secondary | ICD-10-CM | POA: Insufficient documentation

## 2022-05-22 DIAGNOSIS — D649 Anemia, unspecified: Secondary | ICD-10-CM | POA: Insufficient documentation

## 2022-05-22 DIAGNOSIS — E1136 Type 2 diabetes mellitus with diabetic cataract: Secondary | ICD-10-CM | POA: Insufficient documentation

## 2022-05-22 DIAGNOSIS — Z9884 Bariatric surgery status: Secondary | ICD-10-CM | POA: Insufficient documentation

## 2022-05-22 DIAGNOSIS — H2511 Age-related nuclear cataract, right eye: Secondary | ICD-10-CM | POA: Insufficient documentation

## 2022-05-22 DIAGNOSIS — E118 Type 2 diabetes mellitus with unspecified complications: Secondary | ICD-10-CM

## 2022-05-22 DIAGNOSIS — I10 Essential (primary) hypertension: Secondary | ICD-10-CM | POA: Diagnosis not present

## 2022-05-22 HISTORY — DX: Dizziness and giddiness: R42

## 2022-05-22 HISTORY — PX: CATARACT EXTRACTION W/PHACO: SHX586

## 2022-05-22 LAB — GLUCOSE, CAPILLARY: Glucose-Capillary: 152 mg/dL — ABNORMAL HIGH (ref 70–99)

## 2022-05-22 SURGERY — PHACOEMULSIFICATION, CATARACT, WITH IOL INSERTION
Anesthesia: Monitor Anesthesia Care | Site: Eye | Laterality: Right

## 2022-05-22 MED ORDER — LACTATED RINGERS IV SOLN: INTRAVENOUS | Status: DC

## 2022-05-22 MED ORDER — SIGHTPATH DOSE#1 BSS IO SOLN
INTRAOCULAR | Status: DC | PRN
  Administered 2022-05-22: 1 mL

## 2022-05-22 MED ORDER — ARMC OPHTHALMIC DILATING DROPS
1.0000 | OPHTHALMIC | Status: DC | PRN
  Administered 2022-05-22 (×3): 1 via OPHTHALMIC

## 2022-05-22 MED ORDER — SIGHTPATH DOSE#1 NA HYALUR & NA CHOND-NA HYALUR IO KIT
PACK | INTRAOCULAR | Status: DC | PRN
  Administered 2022-05-22: 1 via OPHTHALMIC

## 2022-05-22 MED ORDER — ONDANSETRON HCL 4 MG/2ML IJ SOLN
INTRAMUSCULAR | Status: DC | PRN
  Administered 2022-05-22: 4 mg via INTRAVENOUS

## 2022-05-22 MED ORDER — ACETAMINOPHEN 325 MG PO TABS: 650.0000 mg | ORAL_TABLET | Freq: Once | ORAL | Status: DC | PRN

## 2022-05-22 MED ORDER — ONDANSETRON HCL 4 MG/2ML IJ SOLN: 4.0000 mg | Freq: Once | INTRAMUSCULAR | Status: DC | PRN

## 2022-05-22 MED ORDER — SIGHTPATH DOSE#1 BSS IO SOLN
INTRAOCULAR | Status: DC | PRN
  Administered 2022-05-22: 70 mL via OPHTHALMIC

## 2022-05-22 MED ORDER — MIDAZOLAM HCL 2 MG/2ML IJ SOLN
INTRAMUSCULAR | Status: DC | PRN
  Administered 2022-05-22 (×2): 1 mg via INTRAVENOUS

## 2022-05-22 MED ORDER — BRIMONIDINE TARTRATE-TIMOLOL 0.2-0.5 % OP SOLN
OPHTHALMIC | Status: DC | PRN
  Administered 2022-05-22: 1 [drp] via OPHTHALMIC

## 2022-05-22 MED ORDER — MOXIFLOXACIN HCL 0.5 % OP SOLN
OPHTHALMIC | Status: DC | PRN
  Administered 2022-05-22: .2 mL via OPHTHALMIC

## 2022-05-22 MED ORDER — ACETAMINOPHEN 160 MG/5ML PO SOLN: 325.0000 mg | ORAL | Status: DC | PRN

## 2022-05-22 MED ORDER — TETRACAINE HCL 0.5 % OP SOLN
1.0000 [drp] | OPHTHALMIC | Status: DC | PRN
  Administered 2022-05-22 (×3): 1 [drp] via OPHTHALMIC

## 2022-05-22 MED ORDER — SIGHTPATH DOSE#1 BSS IO SOLN
INTRAOCULAR | Status: DC | PRN
  Administered 2022-05-22: 15 mL

## 2022-05-22 MED ORDER — FENTANYL CITRATE (PF) 100 MCG/2ML IJ SOLN
INTRAMUSCULAR | Status: DC | PRN
  Administered 2022-05-22: 50 ug via INTRAVENOUS

## 2022-05-22 SURGICAL SUPPLY — 20 items
CANNULA ANT/CHMB 27G (MISCELLANEOUS) IMPLANT
CANNULA ANT/CHMB 27GA (MISCELLANEOUS) IMPLANT
CATARACT SUITE SIGHTPATH (MISCELLANEOUS) ×1 IMPLANT
FEE CATARACT SUITE SIGHTPATH (MISCELLANEOUS) ×1 IMPLANT
GLOVE SRG 8 PF TXTR STRL LF DI (GLOVE) ×1 IMPLANT
GLOVE SURG ENC TEXT LTX SZ7.5 (GLOVE) ×1 IMPLANT
GLOVE SURG GAMMEX PI TX LF 7.5 (GLOVE) IMPLANT
GLOVE SURG UNDER POLY LF SZ8 (GLOVE) ×1
LENS IOL TECNIS EYHANCE 20.0 (Intraocular Lens) IMPLANT
NDL FILTER BLUNT 18X1 1/2 (NEEDLE) ×1 IMPLANT
NDL RETROBULBAR .5 NSTRL (NEEDLE) IMPLANT
NEEDLE FILTER BLUNT 18X1 1/2 (NEEDLE) ×1 IMPLANT
PACK VIT ANT 23G (MISCELLANEOUS) IMPLANT
RING MALYGIN 7.0 (MISCELLANEOUS) IMPLANT
SUT ETHILON 10-0 CS-B-6CS-B-6 (SUTURE)
SUT VICRYL  9 0 (SUTURE)
SUT VICRYL 9 0 (SUTURE) IMPLANT
SUTURE EHLN 10-0 CS-B-6CS-B-6 (SUTURE) IMPLANT
SYR 3ML LL SCALE MARK (SYRINGE) ×1 IMPLANT
WATER STERILE IRR 250ML POUR (IV SOLUTION) ×1 IMPLANT

## 2022-05-22 NOTE — H&P (Signed)
[facility]   Primary Care Physician:  Glean Hess, MD Ophthalmologist: Dr. Leandrew Koyanagi  Pre-Procedure History & Physical: HPI:  Megan Shields is a 77 y.o. female here for ophthalmic surgery.   Past Medical History:  Diagnosis Date   Anemia    vitamin d deficiency   Arthritis    Chronic cough    Depression    Diabetes mellitus without complication (HCC)    Dyspnea    Hypertension    ILD (interstitial lung disease) ([place])    Obesity    BMI 49.44   Pneumonia    Pneumonia due to 2019 novel coronavirus 2022   community acquired also. covid positive May 2022   PONV (postoperative nausea and vomiting)    Primary cancer of right upper lobe of lung ([place]) 06/30/2020   Rosacea    Shoulder contusion 11/22/2015   Vertigo    none since 3/23    Past Surgical History:  Procedure Laterality Date   BLEPHAROPLASTY Bilateral 12/19/2017   CARPAL TUNNEL RELEASE Bilateral 1985   CHOLECYSTECTOMY     COLONOSCOPY WITH PROPOFOL N/A 03/07/2017   Procedure: COLONOSCOPY WITH PROPOFOL;  Surgeon: [name], MD;  Location: [facility] ENDOSCOPY;  Service: Gastroenterology;  Laterality: N/A;   DILATION AND CURETTAGE OF UTERUS  12/2011   benign endometrial polyp   HALLUX VALGUS [place]USTIN Right 09/02/2014   Procedure: [place];  Surgeon: Samara Deist, DPM;  Location: [facility] ORS;  Service: Podiatry;  Laterality: Right;   HERNIA REPAIR     JOINT REPLACEMENT Bilateral    TKR   LAPAROSCOPIC GASTRIC BANDING  2010   Lap Band   LAPAROSCOPIC TOTAL HYSTERECTOMY  12/23/2019   lymph node removed Right    TOTAL KNEE ARTHROPLASTY Bilateral 2003, 2004   TOTAL KNEE ARTHROPLASTY Bilateral    VENTRAL HERNIA REPAIR  2008    Prior to Admission medications   Medication Sig Start Date End Date Taking? Authorizing Provider  celecoxib (CELEBREX) 200 MG capsule TAKE 1 CAPSULE DAILY 08/30/21  Yes Glean Hess, MD  cholecalciferol (VITAMIN D3) 25 MCG (1000 UNIT) tablet Take 1,000 Units by  mouth daily.   Yes [provider]  glipiZIDE (GLUCOTROL XL) 5 MG 24 hr tablet Take 5 mg by mouth daily with breakfast.   Yes [provider]  olmesartan-hydrochlorothiazide (BENICAR HCT) 40-25 MG tablet TAKE 1 TABLET DAILY 01/31/22  Yes Glean Hess, MD  OXYGEN Inhale into the lungs at bedtime.   Yes [provider]  TAGRISSO 40 MG tablet TAKE 1 TABLET DAILY. 04/10/22  Yes Cammie Sickle, MD  acetaminophen (TYLENOL) 500 MG tablet Take 1,000 mg by mouth at bedtime as needed for moderate pain.    [provider]  cyclobenzaprine (FLEXERIL) 10 MG tablet Take 1 tablet (10 mg total) by mouth at bedtime. Patient not taking: Reported on 05/15/2022 11/26/21   Glean Hess, MD  FREESTYLE LITE test strip TEST BLOOD SUGAR TWICE A DAY 02/15/20   Glean Hess, MD  Lancets (FREESTYLE) lancets TEST BLOOD SUGAR TWICE A DAY 02/15/20   Glean Hess, MD  meclizine (ANTIVERT) 25 MG tablet 1 pill at night as needed; if dizziness not improved can take one pill every 8 hours as needed/tolerated. Patient not taking: Reported on 05/15/2022 10/17/20   Cammie Sickle, MD  ondansetron (ZOFRAN) 8 MG tablet One pill every 8 hours as needed for nausea/vomitting. Patient not taking: Reported on 05/15/2022 06/30/20   Cammie Sickle, MD  prochlorperazine (  COMPAZINE) 10 MG tablet Take 1 tablet (10 mg total) by mouth every 6 (six) hours as needed for nausea or vomiting. Patient not taking: Reported on 05/15/2022 01/28/22   Cammie Sickle, MD  tirzepatide [facility]) 2.5 MG/0.5ML Pen INJECT 2.5 MG UNDER THE SKIN WEEKLY Patient not taking: Reported on 05/15/2022 03/07/22   Glean Hess, MD    Allergies as of 04/26/2022 - Review Complete 04/01/2022  Allergen Reaction Noted   Pneumococcal vaccines Hives 02/27/2015   Wound dressing adhesive Rash 07/17/2020   Amoxicillin Rash 03/08/2015   Codeine  08/01/2014   Penicillins Rash 05/04/2020    Family  History  Problem Relation Age of Onset   Breast cancer Mother 42   Breast cancer Paternal Aunt        3 pat aunts   Liver disease Brother        died 82    Social History   Socioeconomic History   Marital status: Married    Spouse name: Fritz Pickerel   Number of children: Not on file   Years of education: Not on file   Highest education level: Not on file  Occupational History   Occupation: Probation officer    Comment: RETIRED  Tobacco Use   Smoking status: Never   Smokeless tobacco: Never  Vaping Use   Vaping Use: Never used  Substance and Sexual Activity   Alcohol use: No    Alcohol/week: 0.0 standard drinks of alcohol   Drug use: No   Sexual activity: Not Currently  Other Topics Concern   Not on file  Social History Narrative   Never smoked; no alcohol. Lives in [place] with husband . Home maker.    Daughter lives next door. 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   Social Determinants of Health   Financial Resource Strain: Low Risk  (11/22/2019)   Overall Financial Resource Strain (CARDIA)    Difficulty of Paying Living Expenses: Not hard at all  Food Insecurity: No Food Insecurity (11/21/2021)   Hunger Vital Sign    Worried About Running Out of Food in the Last Year: Never true    Ran Out of Food in the Last Year: Never true  Transportation Needs: No Transportation Needs (11/21/2021)   PRAPARE - Hydrologist (Medical): No    Lack of Transportation (Non-Medical): No  Physical Activity: Insufficiently Active (11/22/2019)   Exercise Vital Sign    Days of Exercise per Week: 3 days    Minutes of Exercise per Session: 30 min  Stress: No Stress Concern Present (11/22/2019)   [place]    Feeling of Stress : Not at all  Social Connections: Moderately Isolated (11/22/2019)   Social Connection and Isolation Panel [NHANES]    Frequency of Communication with Friends and Family: More  than three times a week    Frequency of Social Gatherings with Friends and Family: More than three times a week    Attends Religious Services: Never    Marine scientist or Organizations: No    Attends Archivist Meetings: Never    Marital Status: Married  Human resources officer Violence: Not At Risk (11/22/2019)   Humiliation, Afraid, Rape, and Kick questionnaire    Fear of Current or Ex-Partner: No    Emotionally Abused: No    Physically Abused: No    Sexually Abused: No    Review of Systems: See HPI, otherwise negative ROS  Physical  Exam: BP (!) 161/72   Temp (!) 97.2 F (36.2 C) (Tympanic)   Ht 5' 4.02" (1.626 m)   Wt (!) 136.9 kg   SpO2 97%   BMI 51.78 kg/m  General:   Alert,  pleasant and cooperative in NAD Head:  Normocephalic and atraumatic. Lungs:  Clear to auscultation.    Heart:  Regular rate and rhythm.   Impression/Plan: Megan Shields is here for ophthalmic surgery.  Risks, benefits, limitations, and alternatives regarding ophthalmic surgery have been reviewed with the patient.  Questions have been answered.  All parties agreeable.   Leandrew Koyanagi, MD  05/22/2022, 9:21 AM

## 2022-05-22 NOTE — Op Note (Signed)
LOCATION:  [facility]   PREOPERATIVE DIAGNOSIS:    Nuclear sclerotic cataract right eye. H25.11   POSTOPERATIVE DIAGNOSIS:  Nuclear sclerotic cataract right eye.     PROCEDURE:  Phacoemusification with posterior chamber intraocular lens placement of the right eye   ULTRASOUND TIME: Procedure(s) with comments: CATARACT EXTRACTION PHACO AND INTRAOCULAR LENS PLACEMENT (IOC) RIGHT DIABETIC (Right) - 3.79 0:41.1  LENS:   Implant Name Type Inv. Item Serial No. Manufacturer Lot No. LRB No. Used Action  LENS IOL TECNIS EYHANCE 20.0 - [id number] Intraocular Lens LENS IOL TECNIS EYHANCE 20.0 [id number] [employer]  Right 1 Implanted         SURGEON:  Wyonia Hough, MD   ANESTHESIA:  Topical with tetracaine drops and 2% Xylocaine jelly, augmented with 1% preservative-free intracameral lidocaine.    COMPLICATIONS:  None.   DESCRIPTION OF PROCEDURE:  The patient was identified in the holding room and transported to the operating room and placed in the supine position under the operating microscope.  The right eye was identified as the operative eye and it was prepped and draped in the usual sterile ophthalmic fashion.   A 1 millimeter clear-corneal paracentesis was made at the 12:00 position.  0.5 ml of preservative-free 1% lidocaine was injected into the anterior chamber. The anterior chamber was filled with Viscoat viscoelastic.  A 2.4 millimeter keratome was used to make a near-clear corneal incision at the 9:00 position.  A curvilinear capsulorrhexis was made with a cystotome and capsulorrhexis forceps.  Balanced salt solution was used to hydrodissect and hydrodelineate the nucleus.   Phacoemulsification was then used in stop and chop fashion to remove the lens nucleus and epinucleus.  The remaining cortex was then removed using the irrigation and aspiration handpiece. Provisc was then placed into the capsular bag to distend it for lens placement.  A lens was then injected  into the capsular bag.  The remaining viscoelastic was aspirated.   Wounds were hydrated with balanced salt solution.  The anterior chamber was inflated to a physiologic pressure with balanced salt solution.  No wound leaks were noted. Vigamox 0.2 ml of a 1mg  per ml solution was injected into the anterior chamber for a dose of 0.2 mg of intracameral antibiotic at the completion of the case.   Timolol and Brimonidine drops were applied to the eye.  The patient was taken to the recovery room in stable condition without complications of anesthesia or surgery.   Manraj Yeo 05/22/2022, 10:30 AM

## 2022-05-22 NOTE — Anesthesia Postprocedure Evaluation (Signed)
Anesthesia Post Note  Patient: Megan Shields  Procedure(s) Performed: CATARACT EXTRACTION PHACO AND INTRAOCULAR LENS PLACEMENT (IOC) RIGHT DIABETIC (Right: Eye)  Patient location during evaluation: PACU Anesthesia Type: MAC Level of consciousness: awake and alert, oriented and patient cooperative Pain management: pain level controlled Vital Signs Assessment: post-procedure vital signs reviewed and stable Respiratory status: spontaneous breathing, nonlabored ventilation and respiratory function stable Cardiovascular status: blood pressure returned to baseline and stable Postop Assessment: adequate PO intake Anesthetic complications: no   No notable events documented.   Last Vitals:  Vitals:   05/22/22 1030 05/22/22 1036  BP: 139/68 133/69  Pulse: 75 77  Temp: (!) 36.1 C (!) 36.1 C  SpO2: 94% 96%    Last Pain:  Vitals:   05/22/22 1036  TempSrc:   PainSc: 0-No pain                 Darrin Nipper

## 2022-05-22 NOTE — Anesthesia Preprocedure Evaluation (Addendum)
Anesthesia Evaluation  Patient identified by MRN, date of birth, ID band Patient awake    Reviewed: Allergy & Precautions, NPO status , Patient's Chart, lab work & pertinent test results  History of Anesthesia Complications (+) PONV and history of anesthetic complications  Airway Mallampati: III   Neck ROM: Full    Dental  (+) Missing   Pulmonary  Lung CA; interstitial lung disease   Pulmonary exam normal breath sounds clear to auscultation       Cardiovascular hypertension, Normal cardiovascular exam Rhythm:Regular Rate:Normal     Neuro/Psych  PSYCHIATRIC DISORDERS  Depression    negative neurological ROS     GI/Hepatic S/p gastric band 2010   Endo/Other  diabetes, Type 2  Class 3 obesity  Renal/GU      Musculoskeletal  (+) Arthritis ,    Abdominal   Peds  Hematology  (+) Blood dyscrasia, anemia   Anesthesia Other Findings   Reproductive/Obstetrics                             Anesthesia Physical Anesthesia Plan  ASA: 3  Anesthesia Plan: MAC   Post-op Pain Management:    Induction: Intravenous  PONV Risk Score and Plan: 3 and Treatment may vary due to age or medical condition, Midazolam and TIVA  Airway Management Planned: Natural Airway and Nasal Cannula  Additional Equipment:   Intra-op Plan:   Post-operative Plan:   Informed Consent: I have reviewed the patients History and Physical, chart, labs and discussed the procedure including the risks, benefits and alternatives for the proposed anesthesia with the patient or authorized representative who has indicated his/her understanding and acceptance.     Dental advisory given  Plan Discussed with: CRNA  Anesthesia Plan Comments: (LMA/GETA backup discussed.  Patient consented for risks of anesthesia including but not limited to:  - adverse reactions to medications - damage to eyes, teeth, lips or other oral mucosa -  nerve damage due to positioning  - sore throat or hoarseness - damage to heart, brain, nerves, lungs, other parts of body or loss of life  Informed patient about role of CRNA in peri- and intra-operative care.  Patient voiced understanding.)       Anesthesia Quick Evaluation

## 2022-05-22 NOTE — Transfer of Care (Signed)
Immediate Anesthesia Transfer of Care Note  Patient: Megan Shields  Procedure(s) Performed: CATARACT EXTRACTION PHACO AND INTRAOCULAR LENS PLACEMENT (IOC) RIGHT DIABETIC (Right: Eye)  Patient Location: PACU  Anesthesia Type: MAC  Level of Consciousness: awake, alert  and patient cooperative  Airway and Oxygen Therapy: Patient Spontanous Breathing and Patient connected to supplemental oxygen  Post-op Assessment: Post-op Vital signs reviewed, Patient's Cardiovascular Status Stable, Respiratory Function Stable, Patent Airway and No signs of Nausea or vomiting  Post-op Vital Signs: Reviewed and stable  Complications: No notable events documented.

## 2022-05-23 DIAGNOSIS — H2512 Age-related nuclear cataract, left eye: Secondary | ICD-10-CM | POA: Diagnosis not present

## 2022-05-23 DIAGNOSIS — H2511 Age-related nuclear cataract, right eye: Secondary | ICD-10-CM | POA: Diagnosis not present

## 2022-05-31 ENCOUNTER — Ambulatory Visit
Admission: RE | Admit: 2022-05-31 | Discharge: 2022-05-31 | Disposition: A | Discharge status: Home / Self Care | Payer: Medicare Other | Source: Ambulatory Visit | Attending: Internal Medicine | Admitting: Internal Medicine

## 2022-05-31 DIAGNOSIS — C3411 Malignant neoplasm of upper lobe, right bronchus or lung: Secondary | ICD-10-CM | POA: Insufficient documentation

## 2022-05-31 DIAGNOSIS — C3490 Malignant neoplasm of unspecified part of unspecified bronchus or lung: Secondary | ICD-10-CM | POA: Diagnosis not present

## 2022-05-31 DIAGNOSIS — R918 Other nonspecific abnormal finding of lung field: Secondary | ICD-10-CM | POA: Diagnosis not present

## 2022-05-31 LAB — POCT I-STAT CREATININE: Creatinine, Ser: 1.1 mg/dL — ABNORMAL HIGH (ref 0.44–1.00)

## 2022-05-31 MED ORDER — IOHEXOL 300 MG/ML  SOLN
100.0000 mL | Freq: Once | INTRAMUSCULAR | Status: AC | PRN
  Administered 2022-05-31: 100 mL via INTRAVENOUS

## 2022-06-04 ENCOUNTER — Ambulatory Visit (INDEPENDENT_AMBULATORY_CARE_PROVIDER_SITE_OTHER): Payer: Medicare Other | Admitting: Internal Medicine

## 2022-06-04 ENCOUNTER — Encounter: Payer: Self-pay | Admitting: Internal Medicine

## 2022-06-04 ENCOUNTER — Ambulatory Visit (INDEPENDENT_AMBULATORY_CARE_PROVIDER_SITE_OTHER): Payer: Medicare Other

## 2022-06-04 VITALS — BP 124/78 | HR 70 | Ht 64.02 in | Wt 301.0 lb

## 2022-06-04 DIAGNOSIS — E1169 Type 2 diabetes mellitus with other specified complication: Secondary | ICD-10-CM

## 2022-06-04 DIAGNOSIS — J849 Interstitial pulmonary disease, unspecified: Secondary | ICD-10-CM

## 2022-06-04 DIAGNOSIS — E785 Hyperlipidemia, unspecified: Secondary | ICD-10-CM

## 2022-06-04 DIAGNOSIS — E118 Type 2 diabetes mellitus with unspecified complications: Secondary | ICD-10-CM | POA: Diagnosis not present

## 2022-06-04 DIAGNOSIS — I10 Essential (primary) hypertension: Secondary | ICD-10-CM

## 2022-06-04 DIAGNOSIS — C770 Secondary and unspecified malignant neoplasm of lymph nodes of head, face and neck: Secondary | ICD-10-CM | POA: Diagnosis not present

## 2022-06-04 DIAGNOSIS — D702 Other drug-induced agranulocytosis: Secondary | ICD-10-CM | POA: Diagnosis not present

## 2022-06-04 DIAGNOSIS — Z0000 Encounter for general adult medical examination without abnormal findings: Secondary | ICD-10-CM | POA: Diagnosis not present

## 2022-06-04 LAB — POCT GLYCOSYLATED HEMOGLOBIN (HGB A1C): Hemoglobin A1C: 7.9 % — AB (ref 4.0–5.6)

## 2022-06-04 MED ORDER — TIRZEPATIDE 5 MG/0.5ML ~~LOC~~ SOAJ: 5.0000 mg | SUBCUTANEOUS | 1 refills | Status: DC

## 2022-06-04 NOTE — Progress Notes (Signed)
Subjective:   Megan Shields is a 77 y.o. female who presents for Medicare Annual (Subsequent) preventive examination.  I connected with  Megan Imamela Uzzle on 06/04/22 by an in person visit and verified that I am speaking with the correct person using two identifiers.  Patient Location: Other:  In office  Provider Location: Office/Clinic   Review of Systems    Defer to PCP  Cardiac Risk Factors include: advanced age (>[id number], 32>65 women);diabetes mellitus     Objective:    There were no vitals filed for this visit. There is no height or weight on file to calculate BMI.     06/04/2022    4:22 PM 05/22/2022    9:02 AM 04/01/2022    9:57 AM 01/28/2022    2:54 PM 11/22/2021   11:01 AM 10/11/2021   10:02 AM 08/30/2021   10:24 AM  Advanced Directives  Does Patient Have a Medical Advance Directive? Yes Yes Yes Yes Yes Yes Yes  Type of Estate agentAdvance Directive Healthcare Power of [place] Lake[place];Living will Living will;Healthcare Power of State Street Corporationttorney Healthcare Power of [employer] Corporationttorney Healthcare Power of [place]orney;Living will Healthcare Power of [place];Living will Healthcare Power of [place]orney;Living will Healthcare Power of [place];Living will  Does patient want to make changes to medical advance directive?   No - Patient declined      Copy of Healthcare Power of Attorney in Chart? No - copy requested No - copy requested No - copy requested        Current Medications (verified) Outpatient Encounter Medications as of 06/04/2022  Medication Sig   acetaminophen (TYLENOL) 500 MG tablet Take 1,000 mg by mouth at bedtime as needed for moderate pain.   celecoxib (CELEBREX) 200 MG capsule TAKE 1 CAPSULE DAILY   cholecalciferol (VITAMIN D3) 25 MCG (1000 UNIT) tablet Take 1,000 Units by mouth daily.   cyclobenzaprine (FLEXERIL) 10 MG tablet Take 1 tablet (10 mg total) by mouth at bedtime.   FREESTYLE LITE test strip TEST BLOOD SUGAR TWICE A DAY   glipiZIDE (GLUCOTROL XL) 5 MG 24 hr tablet Take 5 mg by mouth daily with  breakfast.   Lancets (FREESTYLE) lancets TEST BLOOD SUGAR TWICE A DAY   olmesartan-hydrochlorothiazide (BENICAR HCT) 40-25 MG tablet TAKE 1 TABLET DAILY   ondansetron (ZOFRAN) 8 MG tablet One pill every 8 hours as needed for nausea/vomitting.   OXYGEN Inhale into the lungs at bedtime.   prochlorperazine (COMPAZINE) 10 MG tablet Take 1 tablet (10 mg total) by mouth every 6 (six) hours as needed for nausea or vomiting.   TAGRISSO 40 MG tablet TAKE 1 TABLET DAILY.   tirzepatide [facility](MOUNJARO) 5 MG/0.5ML Pen Inject 5 mg into the skin once a week.   No facility-administered encounter medications on file as of 06/04/2022.    Allergies (verified) Pneumococcal vaccines, Wound dressing adhesive, Amoxicillin, Codeine, and Penicillins   History: Past Medical History:  Diagnosis Date   Anemia    vitamin d deficiency   Arthritis    Chronic cough    Depression    Diabetes mellitus without complication    Dyspnea    Hypertension    ILD (interstitial lung disease)    Obesity    BMI 49.44   Pneumonia    Pneumonia due to 2019 novel coronavirus 2022   community acquired also. covid positive May 2022   PONV (postoperative nausea and vomiting)    Primary cancer of right upper lobe of lung 06/30/2020   Rosacea    Shoulder contusion 11/22/2015   Vertigo  none since 3/23   Past Surgical History:  Procedure Laterality Date   BLEPHAROPLASTY Bilateral 12/19/2017   CARPAL TUNNEL RELEASE Bilateral 1985   CATARACT EXTRACTION W/PHACO Right 05/22/2022   Procedure: CATARACT EXTRACTION PHACO AND INTRAOCULAR LENS PLACEMENT (IOC) RIGHT DIABETIC;  Surgeon: Lockie Mola, MD;  Location[facility];  Service: Ophthalmology;  Laterality: Right;  3.79 0:41.1   CHOLECYSTECTOMY     COLONOSCOPY WITH PROPOFOL N/A 03/07/2017   Procedure: COLONOSCOPY WITH PROPOFOL;  Surgeon: Wyline Mood, MD;  Location: [facility];  Service: Gastroenterology;  Laterality: N/A;   DILATION AND CURETTAGE OF UTERUS   12/2011   benign endometrial polyp   HALLUX VALGUS AUSTIN Right 09/02/2014   Procedure: HALLUX VALGUS AUSTIN;  Surgeon: Gwyneth Revels, DPM;  Location: [facility];  Service: Podiatry;  Laterality: Right;   HERNIA REPAIR     JOINT REPLACEMENT Bilateral    TKR   LAPAROSCOPIC GASTRIC BANDING  2010   Lap Band   LAPAROSCOPIC TOTAL HYSTERECTOMY  12/23/2019   lymph node removed Right    TOTAL KNEE ARTHROPLASTY Bilateral 2003, 2004   TOTAL KNEE ARTHROPLASTY Bilateral    VENTRAL HERNIA REPAIR  2008   Family History  Problem Relation Age of Onset   Breast cancer Mother 71   Breast cancer Paternal Aunt        3 pat aunts   Liver disease Brother        died 36   Social History   Socioeconomic History   Marital status: Married    Spouse name: Peyton Najjar   Number of children: Not on file   Years of education: Not on file   Highest education level: Not on file  Occupational History   Occupation: Therapist, music    Comment: RETIRED  Tobacco Use   Smoking status: Never   Smokeless tobacco: Never  Vaping Use   Vaping Use: Never used  Substance and Sexual Activity   Alcohol use: No    Alcohol/week: 0.0 standard drinks of alcohol   Drug use: No   Sexual activity: Not Currently  Other Topics Concern   Not on file  Social History Narrative   Never smoked; no alcohol. Lives in [place] with husband . Home maker.    Daughter lives next door. 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   Social Determinants of Health   Financial Resource Strain: Low Risk  (06/04/2022)   Overall Financial Resource Strain (CARDIA)    Difficulty of Paying Living Expenses: Not hard at all  Food Insecurity: No Food Insecurity (06/04/2022)   Hunger Vital Sign    Worried About Running Out of Food in the Last Year: Never true    Ran Out of Food in the Last Year: Never true  Transportation Needs: No Transportation Needs (06/04/2022)   PRAPARE - Administrator, Civil Service (Medical): No    Lack of  Transportation (Non-Medical): No  Physical Activity: Inactive (06/04/2022)   Exercise Vital Sign    Days of Exercise per Week: 0 days    Minutes of Exercise per Session: 0 min  Stress: No Stress Concern Present (06/04/2022)   [employer] of Occupational Health - Occupational Stress Questionnaire    Feeling of Stress : Not at all  Social Connections: Moderately Isolated (11/22/2019)   Social Connection and Isolation Panel [NHANES]    Frequency of Communication with Friends and Family: More than three times a week    Frequency of Social Gatherings with Friends and Family: More  than three times a week    Attends Religious Services: Never    Active Member of Clubs or Organizations: No    Attends Banker Meetings: Never    Marital Status: Married    Tobacco Counseling Counseling given: Not Answered   Clinical Intake:  Pre-visit preparation completed: Yes  Pain : No/denies pain     BMI - recorded: 51.63 Nutritional Status: BMI > 30  Obese Nutritional Risks: None Diabetes: Yes     Diabetic?Yes  Interpreter Needed?: No  Information entered by :: Margaretha Sheffield, CMA   Activities of Daily Living    06/04/2022    4:23 PM 05/22/2022    9:01 AM  In your present state of health, do you have any difficulty performing the following activities:  Hearing? 0 0  Vision? 0 0  Difficulty concentrating or making decisions? 0 0  Walking or climbing stairs? 0 0  Dressing or bathing? 0 0  Doing errands, shopping? 0   Preparing Food and eating ? N   Using the Toilet? N   In the past six months, have you accidently leaked urine? N   Do you have problems with loss of bowel control? N   Managing your Medications? N   Managing your Finances? N   Housekeeping or managing your Housekeeping? N     Patient Care Team: Reubin Milan, MD as PCP - General (Internal Medicine) [facility] (Ophthalmology) Jesusita Oka, MD (Dermatology) Glory Buff, RN as  Oncology Nurse Navigator Earna Coder, MD as Consulting Physician (Oncology) Lockie Mola, MD as Referring Physician (Ophthalmology)  Indicate any recent Medical Services you may have received from other than [facility] providers in the past year (date may be approximate).     Assessment:   This is a routine wellness examination for Chrysa.  Hearing/Vision screen Hearing Screening - Comments:: No Concerns Vision Screening - Comments:: No concerns.  Dietary issues and exercise activities discussed: Current Exercise Habits: The patient does not participate in regular exercise at present, Exercise limited by: None identified   Goals Addressed   None   Depression Screen    06/04/2022    3:19 PM 01/30/2022   11:16 AM 06/08/2021    9:35 AM 02/07/2021    9:42 AM 10/09/2020   10:11 AM 04/26/2020   11:18 AM 04/14/2020   10:48 AM  PHQ 2/9 Scores  PHQ - 2 Score 1 1 0 0 0 0 0  PHQ- 9 Score 5 8 5 6 4 8 4     Fall Risk    06/04/2022    3:19 PM 01/30/2022   11:17 AM 06/08/2021    9:35 AM 02/07/2021    9:42 AM 10/09/2020   10:11 AM  Fall Risk   Falls in the past year? 0 0 0 0 0  Number falls in past yr: 0 0 0 0 0  Injury with Fall? 0 0 0 0 0  Risk for fall due to : No Fall Risks No Fall Risks No Fall Risks History of fall(s) No Fall Risks  Follow up Falls evaluation completed Falls evaluation completed Falls evaluation completed Falls evaluation completed Falls evaluation completed    FALL RISK PREVENTION PERTAINING TO THE HOME:  Any stairs in or around the home? No  Home free of loose throw rugs in walkways, pet beds, electrical cords, etc? Yes  Adequate lighting in your home to reduce risk of falls? Yes   ASSISTIVE DEVICES UTILIZED TO PREVENT FALLS:  Life alert?  No  Use of a cane, walker or w/c? No   TIMED UP AND GO:  Was the test performed? Yes . Gait steady and fast without use of assistive device  Cognitive Function:        06/04/2022    4:24 PM 11/22/2019     8:30 AM 10/06/2018    8:56 AM 11/22/2016    8:29 AM 11/22/2015    8:26 AM  6CIT Screen  What Year? 0 points 0 points 0 points 0 points 0 points  What month? 0 points 0 points 0 points 0 points 0 points  What time? 0 points 0 points 0 points 0 points 0 points  Count back from 20 0 points 0 points 0 points 0 points 0 points  Months in reverse 0 points 0 points 0 points 2 points 0 points  Repeat phrase 4 points 0 points 4 points 2 points 0 points  Total Score 4 points 0 points 4 points 4 points 0 points    Immunizations Immunization History  Administered Date(s) Administered   Fluad Quad(high Dose 65+) 11/26/2021   PFIZER(Purple Top)SARS-COV-2 Vaccination 03/26/2019, 04/16/2019   Pneumococcal Conjugate-13 03/11/2014   Pneumococcal Polysaccharide-23 09/04/2012   Zoster, Live 03/30/2013    TDAP status: Due, Education has been provided regarding the importance of this vaccine. Advised may receive this vaccine at local pharmacy or Health Dept. Aware to provide a copy of the vaccination record if obtained from local pharmacy or Health Dept. Verbalized acceptance and understanding.  Flu Vaccine status: Up to date  Pneumococcal vaccine status: Up to date  Covid-19 vaccine status: Completed vaccines  Qualifies for Shingles Vaccine? Yes   Zostavax completed No   Shingrix Completed?: No.    Education has been provided regarding the importance of this vaccine. Patient has been advised to call insurance company to determine out of pocket expense if they have not yet received this vaccine. Advised may also receive vaccine at local pharmacy or Health Dept. Verbalized acceptance and understanding.  Screening Tests Health Maintenance  Topic Date Due   DTaP/Tdap/Td (1 - Tdap) Never done   DEXA SCAN  Never done   COVID-19 Vaccine (3 - Pfizer risk series) 05/14/2019   Pneumonia Vaccine 30+ Years old (3 of 3 - PPSV23 or PCV20) 11/27/2022 (Originally 09/04/2017)   Zoster Vaccines- Shingrix (1 of 2)  10/09/2025 (Originally 10/30/1964)   Diabetic kidney evaluation - Urine ACR  11/27/2026 (Originally 11/22/2017)   INFLUENZA VACCINE  09/26/2022   FOOT EXAM  11/27/2022   HEMOGLOBIN A1C  12/04/2022   OPHTHALMOLOGY EXAM  01/11/2023   Diabetic kidney evaluation - eGFR measurement  04/02/2023   Medicare Annual Wellness (AWV)  06/04/2023   Hepatitis C Screening  Completed   HPV VACCINES  Aged Out   MAMMOGRAM  Discontinued    Health Maintenance  Health Maintenance Due  Topic Date Due   DTaP/Tdap/Td (1 - Tdap) Never done   DEXA SCAN  Never done   COVID-19 Vaccine (3 - Pfizer risk series) 05/14/2019    Colorectal cancer screening: No longer required.   Mammogram status: No longer required due to age.  Lung Cancer Screening: (Low Dose CT Chest recommended if Age 20-80 years, 30 pack-year currently smoking OR have quit w/in 15years.) does not qualify.   Additional Screening:  Hepatitis C Screening: does qualify; Completed 11/22/2015  Vision Screening: Recommended annual ophthalmology exams for early detection of glaucoma and other disorders of the eye. Is the patient up to date with their annual  eye exam?  Yes  Who is the provider or what is the name of the office in which the patient attends annual eye exams? [facility]  Dental Screening: Recommended annual dental exams for proper oral hygiene  Community Resource Referral / Chronic Care Management: CRR required this visit?  No   CCM required this visit?  No      Plan:     I have personally reviewed and noted the following in the patient's chart:   Medical and social history Use of alcohol, tobacco or illicit drugs  Current medications and supplements including opioid prescriptions. Patient is not currently taking opioid prescriptions. Functional ability and status Nutritional status Physical activity Advanced directives List of other physicians Hospitalizations, surgeries, and ER visits in previous 12  months Vitals Screenings to include cognitive, depression, and falls Referrals and appointments  In addition, I have reviewed and discussed with patient certain preventive protocols, quality metrics, and best practice recommendations. A written personalized care plan for preventive services as well as general preventive health recommendations were provided to patient.     Mariel Sleet, CMA   06/04/2022   Nurse Notes: None.

## 2022-06-04 NOTE — Assessment & Plan Note (Addendum)
Clinically stable without s/s of hypoglycemia. Tolerating glipizide well without side effects or other concerns. Prescribed Mounjara but she has not been able to get it until now. Lab Results  Component Value Date   HGBA1C 7.9 (H) 11/26/2021  A1C today is 7.9 Will resume Mounjaro and follow up in 4 months.

## 2022-06-04 NOTE — Assessment & Plan Note (Signed)
Followed by Oncology On [facility]

## 2022-06-04 NOTE — Progress Notes (Signed)
Date:  06/04/2022   Name:  Megan Shields   DOB:  1946-01-26   MRN:  [id number]   Chief Complaint: Diabetes  Diabetes She presents for her follow-up diabetic visit. She has type 2 diabetes mellitus. Her disease course has been stable. Pertinent negatives for hypoglycemia include no headaches or tremors. Pertinent negatives for diabetes include no chest pain, no fatigue, no polydipsia and no polyuria. Current diabetic treatment includes oral agent (monotherapy) (glimepiride). She is compliant with treatment all of the time. Her breakfast blood glucose is taken between 6-7 am. Her breakfast blood glucose range is generally 140-180 mg/dl.  Hypertension This is a chronic problem. The problem is controlled. Pertinent negatives include no chest pain, headaches, palpitations or shortness of breath. Past treatments include angiotensin blockers and diuretics. The current treatment provides significant improvement. There are no compliance problems.     Lab Results  Component Value Date   NA 136 04/01/2022   K 4.3 04/01/2022   CO2 28 04/01/2022   GLUCOSE 147 (H) 04/01/2022   BUN 23 04/01/2022   CREATININE 1.10 (H) 05/31/2022   CALCIUM 9.8 04/01/2022   GFRNONAA 53 (L) 04/01/2022   Lab Results  Component Value Date   CHOL 192 11/26/2021   HDL 75 11/26/2021   LDLCALC 98 11/26/2021   TRIG 107 11/26/2021   CHOLHDL 2.6 11/26/2021   Lab Results  Component Value Date   TSH 1.680 10/06/2019   Lab Results  Component Value Date   HGBA1C 7.9 (A) 06/04/2022   Lab Results  Component Value Date   WBC 4.2 04/01/2022   HGB 14.8 04/01/2022   HCT 45.7 04/01/2022   MCV 84.9 04/01/2022   PLT 173 04/01/2022   Lab Results  Component Value Date   ALT 14 04/01/2022   AST 21 04/01/2022   ALKPHOS 53 04/01/2022   BILITOT 0.4 04/01/2022   Lab Results  Component Value Date   VD25OH 34.9 03/07/2020     Review of Systems  Constitutional:  Negative for appetite change, fatigue, fever and unexpected  weight change.  HENT:  Negative for tinnitus and trouble swallowing.   Eyes:  Negative for visual disturbance.  Respiratory:  Negative for cough, chest tightness and shortness of breath.   Cardiovascular:  Negative for chest pain, palpitations and leg swelling.  Gastrointestinal:  Negative for abdominal pain.  Endocrine: Negative for polydipsia and polyuria.  Genitourinary:  Negative for dysuria and hematuria.  Musculoskeletal:  Negative for arthralgias.  Neurological:  Negative for tremors, numbness and headaches.  Psychiatric/Behavioral:  Negative for dysphoric mood.     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 Tears off skin. blisters   Amoxicillin Rash   Codeine     Other reaction(s): drowsiness   Penicillins Rash    Past Surgical History:  Procedure Laterality Date   BLEPHAROPLASTY Bilateral 12/19/2017   CARPAL TUNNEL RELEASE Bilateral 1985   CATARACT EXTRACTION W/PHACO Right 05/22/2022   Procedure: CATARACT EXTRACTION PHACO AND INTRAOCULAR LENS  PLACEMENT (IOC) RIGHT DIABETIC;  Surgeon: Lockie Mola, MD;  Location: [facility];  Service: Ophthalmology;  Laterality: Right;  3.79 0:41.1   CHOLECYSTECTOMY     COLONOSCOPY WITH PROPOFOL N/A 03/07/2017   Procedure: COLONOSCOPY WITH PROPOFOL;  Surgeon: Wyline Mood, MD;  Location: [facility];  Service: Gastroenterology;  Laterality: N/A;   DILATION AND CURETTAGE OF UTERUS  12/2011   benign endometrial polyp   HALLUX VALGUS AUSTIN Right 09/02/2014   Procedure: HALLUX VALGUS AUSTIN;  Surgeon: Gwyneth Revels, DPM;  Location: [facility];  Service: Podiatry;  Laterality: Right;   HERNIA REPAIR     JOINT REPLACEMENT Bilateral    TKR   LAPAROSCOPIC GASTRIC BANDING  2010   Lap Band   LAPAROSCOPIC TOTAL HYSTERECTOMY  12/23/2019   lymph node removed Right    TOTAL KNEE ARTHROPLASTY Bilateral 2003, 2004   TOTAL KNEE ARTHROPLASTY Bilateral    VENTRAL HERNIA REPAIR  2008    Social History   Tobacco Use   Smoking status: Never   Smokeless tobacco: Never  Vaping Use   Vaping Use: Never used  Substance Use Topics   Alcohol use: No    Alcohol/week: 0.0 standard drinks of alcohol   Drug use: No     Medication list has been reviewed and updated.  Current Meds  Medication Sig   acetaminophen (TYLENOL) 500 MG tablet Take 1,000 mg by mouth at bedtime as needed for moderate pain.   celecoxib (CELEBREX) 200 MG capsule TAKE 1 CAPSULE DAILY   cholecalciferol (VITAMIN D3) 25 MCG (1000 UNIT) tablet Take 1,000 Units by mouth daily.   cyclobenzaprine (FLEXERIL) 10 MG tablet Take 1 tablet (10 mg total) by mouth at bedtime.   FREESTYLE LITE test strip TEST BLOOD SUGAR TWICE A DAY   glipiZIDE (GLUCOTROL XL) 5 MG 24 hr tablet Take 5 mg by mouth daily with breakfast.   Lancets (FREESTYLE) lancets TEST BLOOD SUGAR TWICE A DAY   olmesartan-hydrochlorothiazide (BENICAR HCT) 40-25 MG tablet TAKE 1 TABLET DAILY   ondansetron (ZOFRAN) 8 MG tablet One pill every 8 hours as needed for  nausea/vomitting.   OXYGEN Inhale into the lungs at bedtime.   prochlorperazine (COMPAZINE) 10 MG tablet Take 1 tablet (10 mg total) by mouth every 6 (six) hours as needed for nausea or vomiting.   TAGRISSO 40 MG tablet TAKE 1 TABLET DAILY.   tirzepatide [facility]) 5 MG/0.5ML Pen Inject 5 mg into the skin once a week.       06/04/2022    3:19 PM 01/30/2022   11:16 AM 06/08/2021    9:35 AM 02/07/2021    9:42 AM  GAD 7 : Generalized Anxiety Score  Nervous, Anxious, on Edge 0 0 0 0  Control/stop worrying 0 0 0 0  Worry too much - different things 0 0 0 1  Trouble relaxing 0 0 0 0  Restless 0 0 0 0  Easily annoyed or irritable 0 0 0 0  Afraid - awful might happen 0 0 0 0  Total GAD 7 Score 0 0 0 1  Anxiety Difficulty Not  difficult at all Not difficult at all Not difficult at all Not difficult at all       06/04/2022    3:19 PM 01/30/2022   11:16 AM 06/08/2021    9:35 AM  Depression screen PHQ 2/9  Decreased Interest 1 1 0  Down, Depressed, Hopeless 0 0 0  PHQ - 2 Score 1 1 0  Altered sleeping 1 1 0  Tired, decreased energy 3 3 3   Change in appetite 0 3 2  Feeling bad or failure about yourself  0 0 0  Trouble concentrating 0 0 0  Moving slowly or fidgety/restless 0 0 0  Suicidal thoughts 0 0 0  PHQ-9 Score 5 8 5   Difficult doing work/chores Not difficult at all Not difficult at all Not difficult at all    BP Readings from Last 3 Encounters:  06/04/22 124/78  05/22/22 133/69  04/01/22 (!) 158/52    Physical Exam Vitals and nursing note reviewed.  Constitutional:      General: She is not in acute distress.    Appearance: She is well-developed. She is obese.  HENT:     Head: Normocephalic and atraumatic.  Cardiovascular:     Rate and Rhythm: Normal rate and regular rhythm.     Pulses: Normal pulses.     Heart sounds: No murmur heard. Pulmonary:     Effort: Pulmonary effort is normal. No respiratory distress.     Breath sounds: No wheezing or rhonchi.  Musculoskeletal:      Cervical back: Normal range of motion.     Right lower leg: No edema.     Left lower leg: No edema.  Lymphadenopathy:     Cervical: No cervical adenopathy.  Skin:    General: Skin is warm and dry.     Findings: No rash.  Neurological:     General: No focal deficit present.     Mental Status: She is alert and oriented to person, place, and time.  Psychiatric:        Mood and Affect: Mood normal.        Behavior: Behavior normal.     Wt Readings from Last 3 Encounters:  06/04/22 (!) 301 lb (136.5 kg)  05/22/22 (!) 301 lb 12.8 oz (136.9 kg)  04/01/22 (!) 300 lb 11.2 oz (136.4 kg)    BP 124/78   Pulse 70   Ht 5' 4.02" (1.626 m)   Wt (!) 301 lb (136.5 kg)   SpO2 96%   BMI 51.63 kg/m   Assessment and Plan:  Problem List Items Addressed This Visit       Cardiovascular and Mediastinum   Essential (primary) hypertension (Chronic)    Clinically stable exam with well controlled BP on Benicar HCT. Tolerating medications without side effects. Pt to continue current regimen and low sodium diet.         Respiratory   ILD (interstitial lung disease)    Stable without shortness of breath, cough or recent URI Some minor cough she believes is due to pollen        Endocrine   Hyperlipidemia associated with type 2 diabetes mellitus (Chronic)    She continues to decline statin medication.      Relevant Medications   tirzepatide [facility](MOUNJARO) 5 MG/0.5ML Pen   Type II diabetes mellitus with complication - Primary (Chronic)    Clinically stable without s/s of hypoglycemia. Tolerating glipizide well without side effects or other concerns. Prescribed Mounjara but she has not been able to  get it until now. Lab Results  Component Value Date   HGBA1C 7.9 (H) 11/26/2021  A1C today is 7.9 Will resume Mounjaro and follow up in 4 months.       Relevant Medications   tirzepatide [facility]) 5 MG/0.5ML Pen   Other Relevant Orders   POCT glycosylated hemoglobin (Hb A1C)  (Completed)     Immune and Lymphatic   Secondary and unspecified malignant neoplasm of lymph nodes of head, face and neck    Followed by Oncology On Tagrisso        Other   Drug-induced neutropenia    Return in about 4 months (around 10/04/2022) for DM, HTN.   MAW completed today by CMA.  Partially dictated using Dragon software, any errors are not intentional.  Reubin Milan, MD [facility] [place], [place]

## 2022-06-04 NOTE — Assessment & Plan Note (Signed)
Clinically stable exam with well controlled BP on Benicar HCT. Tolerating medications without side effects. Pt to continue current regimen and low sodium diet.

## 2022-06-04 NOTE — Assessment & Plan Note (Signed)
She continues to decline statin medication.

## 2022-06-04 NOTE — Assessment & Plan Note (Addendum)
Stable without shortness of breath, cough or recent URI Some minor cough she believes is due to pollen

## 2022-06-05 ENCOUNTER — Encounter: Payer: Self-pay | Admitting: Internal Medicine

## 2022-06-05 ENCOUNTER — Inpatient Hospital Stay: Payer: Medicare Other | Attending: Internal Medicine

## 2022-06-05 ENCOUNTER — Ambulatory Visit: Payer: Medicare Other | Admitting: Internal Medicine

## 2022-06-05 ENCOUNTER — Inpatient Hospital Stay (HOSPITAL_BASED_OUTPATIENT_CLINIC_OR_DEPARTMENT_OTHER): Payer: Medicare Other | Admitting: Internal Medicine

## 2022-06-05 VITALS — BP 141/68 | HR 67 | Temp 97.8°F | Resp 16 | Ht 64.0 in | Wt 300.9 lb

## 2022-06-05 DIAGNOSIS — Z79899 Other long term (current) drug therapy: Secondary | ICD-10-CM | POA: Insufficient documentation

## 2022-06-05 DIAGNOSIS — C3411 Malignant neoplasm of upper lobe, right bronchus or lung: Secondary | ICD-10-CM | POA: Insufficient documentation

## 2022-06-05 DIAGNOSIS — I129 Hypertensive chronic kidney disease with stage 1 through stage 4 chronic kidney disease, or unspecified chronic kidney disease: Secondary | ICD-10-CM | POA: Insufficient documentation

## 2022-06-05 DIAGNOSIS — N183 Chronic kidney disease, stage 3 unspecified: Secondary | ICD-10-CM | POA: Insufficient documentation

## 2022-06-05 DIAGNOSIS — Z7984 Long term (current) use of oral hypoglycemic drugs: Secondary | ICD-10-CM | POA: Diagnosis not present

## 2022-06-05 DIAGNOSIS — E1122 Type 2 diabetes mellitus with diabetic chronic kidney disease: Secondary | ICD-10-CM | POA: Diagnosis not present

## 2022-06-05 LAB — COMPREHENSIVE METABOLIC PANEL
ALT: 14 U/L (ref 0–44)
AST: 21 U/L (ref 15–41)
Albumin: 3.6 g/dL (ref 3.5–5.0)
Alkaline Phosphatase: 57 U/L (ref 38–126)
Anion gap: 7 (ref 5–15)
BUN: 21 mg/dL (ref 8–23)
CO2: 28 mmol/L (ref 22–32)
Calcium: 9.2 mg/dL (ref 8.9–10.3)
Chloride: 98 mmol/L (ref 98–111)
Creatinine, Ser: 1.2 mg/dL — ABNORMAL HIGH (ref 0.44–1.00)
GFR, Estimated: 48 mL/min — ABNORMAL LOW (ref >60)
Glucose, Bld: 167 mg/dL — ABNORMAL HIGH (ref 70–99)
Potassium: 4.4 mmol/L (ref 3.5–5.1)
Sodium: 133 mmol/L — ABNORMAL LOW (ref 135–145)
Total Bilirubin: 0.5 mg/dL (ref 0.3–1.2)
Total Protein: 7.7 g/dL (ref 6.5–8.1)

## 2022-06-05 LAB — CBC WITH DIFFERENTIAL/PLATELET
Abs Immature Granulocytes: 0.01 10*3/uL (ref 0.00–0.07)
Basophils Absolute: 0 10*3/uL (ref 0.0–0.1)
Basophils Relative: 0 %
Eosinophils Absolute: 0.3 10*3/uL (ref 0.0–0.5)
Eosinophils Relative: 6 %
HCT: 44.5 % (ref 36.0–46.0)
Hemoglobin: 14.1 g/dL (ref 12.0–15.0)
Immature Granulocytes: 0 %
Lymphocytes Relative: 17 %
Lymphs Abs: 0.9 10*3/uL (ref 0.7–4.0)
MCH: 27.1 pg (ref 26.0–34.0)
MCHC: 31.7 g/dL (ref 30.0–36.0)
MCV: 85.6 fL (ref 80.0–100.0)
Monocytes Absolute: 0.6 10*3/uL (ref 0.1–1.0)
Monocytes Relative: 11 %
Neutro Abs: 3.3 10*3/uL (ref 1.7–7.7)
Neutrophils Relative %: 66 %
Platelets: 181 10*3/uL (ref 150–400)
RBC: 5.2 MIL/uL — ABNORMAL HIGH (ref 3.87–5.11)
RDW: 14.1 % (ref 11.5–15.5)
WBC: 5.1 10*3/uL (ref 4.0–10.5)
nRBC: 0 % (ref 0.0–0.2)

## 2022-06-05 LAB — MAGNESIUM: Magnesium: 1.8 mg/dL (ref 1.7–2.4)

## 2022-06-05 NOTE — Progress Notes (Signed)
Patient is currently on Glipizide and will go back on Mounjaro on Monday.  Overall the patient in feeling well today.

## 2022-06-05 NOTE — Assessment & Plan Note (Addendum)
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 ON Osimertinib 80 mg once a day [07/31/2020] but currently on Osimetinib 40 mg/day [severe fatigue- since July 2023]; ] APRIL 5th, 2024-..-  Stable size and appearance of right upper lobe mass. No new signs of metastatic disease in the chest, abdomen or pelvis.  No evidence of metastatic disease in the abdomen pelvis;  Stable RIGHT adrenal nodule.  # Continue osimertinib at 40 mg/day  no overt progression of disease [see above]; clinically- stable.   # Fatigue: likley sec to osimertinib. Continue  decreased the dose to 40 mg/day. Stable.   # CKD stage III monitor closely.  Recommend continued hydration control of blood pressure/diabetes.  Stable.   # INCIDENTAL Left sublingual fullness/ Incidental large thyroid goiter/nodule noted- s/p- tongue mass/thyroid nodule- OCT 2023- CT neck-Stable. Monitor for now.   #Chronic joint pains-continue Celebrex every other day [see below]; Tylenol as needed for pain- Stable.   #Diabetes /type II -PBF- BG- 144; currently on Munjauro/ GLP-1 agonist. Stable.   # DISPOSITION:   # follow up in 2 months MD; labs- cbc/cmp/mag;  -Dr.B   # I reviewed the blood work- with the patient in detail; also reviewed the imaging independently [as summarized above]; and with the patient in detail.

## 2022-06-05 NOTE — Progress Notes (Signed)
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  [Dr.Byrnett & Dr.Fleming]; May 2022-MRI brain-NEG.   # MAY 11th, 2022- carbo-alimta x1 cycle; discontinued; June 6th, 2022-osimertinib 80 mg a day  #Thyroid -enlargement; recommend biopsy per radiology; patient /husband has been reluctant  # Incidental-out of place Gastric band [since 2014] gastric band.  Asymptomatic monitor for now  # NGS/MOLECULAR TESTS:POSITIVE for EGFR mutation deletion 19    # PALLIATIVE CARE EVALUATION:  # PAIN MANAGEMENT:    DIAGNOSIS: Lung cancer  STAGE:   IIIC      ;  GOALS:Palliatve  CURRENT/MOST RECENT THERAPY : Osiemertinib      Primary cancer of right upper lobe of lung  06/30/2020 Initial Diagnosis   Primary cancer of right upper lobe of lung (HCC)   06/30/2020 Cancer Staging   Staging form: Lung, AJCC 8th Edition - Clinical: Stage IIIC (cT4, cN3, cM0) - Signed by Earna Coder, MD on 06/30/2020 Histopathologic type: Adenocarcinoma, NOS   07/05/2020 - 07/05/2020 Chemotherapy   Patient is on Treatment Plan : LUNG CARBOplatin / Pemetrexed / Pembrolizumab q21d Induction x 4 cycles / Maintenance Pemetrexed + Pembrolizumab      HISTORY OF PRESENTING ILLNESS:  She is accompanied by husband.  Ambulating independently.   Megan Shields 77 y.o.  female  Stage IV/adenocarcinoma the lung EGFR mutated-currently on osimertinib is here for  follow-up/review results of the CT scan.  Pt has have chronic dyspnea.  Otherwise denies any worsening shortness of breath or cough.  Admits to compliance with her Tagrisso.    Denies any chest pain. S/p right cataract surgery.  Mild chronic Swelling in the legs. No nausea no vomiting .  No diarrhea.  Chronic joint pains.   Review of Systems  Constitutional:  Positive for malaise/fatigue. Negative for chills, diaphoresis, fever and weight loss.  HENT:  Negative for nosebleeds and sore throat.   Eyes:  Negative for double vision.  Respiratory:  Negative for hemoptysis, sputum production and wheezing.   Cardiovascular:  Negative for chest pain, palpitations, orthopnea and leg swelling.  Gastrointestinal:  Negative for abdominal pain, blood in stool, constipation, diarrhea, heartburn, melena, nausea and vomiting.  Genitourinary:  Negative for dysuria, frequency and urgency.  Musculoskeletal:  Negative for back pain and joint pain.  Neurological:  Negative for dizziness, tingling, focal weakness, weakness and headaches.  Endo/Heme/Allergies:  Does not bruise/bleed easily.  Psychiatric/Behavioral:  Negative for depression. The patient is not nervous/anxious and does not have insomnia.      MEDICAL HISTORY:  Past Medical History:  Diagnosis Date   Anemia    vitamin d deficiency   Arthritis    Chronic cough    Depression    Diabetes mellitus without complication    Dyspnea    Hypertension    ILD (interstitial lung disease)    Obesity    BMI 49.44   Pneumonia    Pneumonia due to 2019 novel coronavirus 2022   community acquired also. covid positive May 2022   PONV (postoperative nausea and vomiting)    Primary cancer  of right upper lobe of lung 06/30/2020   Rosacea    Shoulder contusion 11/22/2015   Vertigo    none since 3/23    SURGICAL HISTORY: Past Surgical History:  Procedure Laterality Date   BLEPHAROPLASTY Bilateral 12/19/2017   CARPAL TUNNEL RELEASE Bilateral 1985    CATARACT EXTRACTION W/PHACO Right 05/22/2022   Procedure: CATARACT EXTRACTION PHACO AND INTRAOCULAR LENS PLACEMENT (IOC) RIGHT DIABETIC;  Surgeon: Lockie Mola, MD;  Location: [facility];  Service: Ophthalmology;  Laterality: Right;  3.79 0:41.1   CHOLECYSTECTOMY     COLONOSCOPY WITH PROPOFOL N/A 03/07/2017   Procedure: COLONOSCOPY WITH PROPOFOL;  Surgeon: Wyline Mood, MD;  Location: [facility];  Service: Gastroenterology;  Laterality: N/A;   DILATION AND CURETTAGE OF UTERUS  12/2011   benign endometrial polyp   HALLUX VALGUS AUSTIN Right 09/02/2014   Procedure: HALLUX VALGUS AUSTIN;  Surgeon: Gwyneth Revels, DPM;  Location: [facility] ORS;  Service: Podiatry;  Laterality: Right;   HERNIA REPAIR     JOINT REPLACEMENT Bilateral    TKR   LAPAROSCOPIC GASTRIC BANDING  2010   Lap Band   LAPAROSCOPIC TOTAL HYSTERECTOMY  12/23/2019   lymph node removed Right    TOTAL KNEE ARTHROPLASTY Bilateral 2003, 2004   TOTAL KNEE ARTHROPLASTY Bilateral    VENTRAL HERNIA REPAIR  2008    SOCIAL HISTORY: Social History   Socioeconomic History   Marital status: Married    Spouse name: Peyton Najjar   Number of children: Not on file   Years of education: Not on file   Highest education level: Not on file  Occupational History   Occupation: Therapist, music    Comment: RETIRED  Tobacco Use   Smoking status: Never   Smokeless tobacco: Never  Vaping Use   Vaping Use: Never used  Substance and Sexual Activity   Alcohol use: No    Alcohol/week: 0.0 standard drinks of alcohol   Drug use: No   Sexual activity: Not Currently  Other Topics Concern   Not on file  Social History Narrative   Never smoked; no alcohol. Lives in [place] with husband . Home maker.    Daughter lives next door. 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   Social Determinants of Health   Financial Resource Strain: Low Risk  (06/04/2022)   Overall Financial Resource Strain (CARDIA)    Difficulty of Paying Living  Expenses: Not hard at all  Food Insecurity: No Food Insecurity (06/04/2022)   Hunger Vital Sign    Worried About Running Out of Food in the Last Year: Never true    Ran Out of Food in the Last Year: Never true  Transportation Needs: No Transportation Needs (06/04/2022)   PRAPARE - Administrator, Civil Service (Medical): No    Lack of Transportation (Non-Medical): No  Physical Activity: Inactive (06/04/2022)   Exercise Vital Sign    Days of Exercise per Week: 0 days    Minutes of Exercise per Session: 0 min  Stress: No Stress Concern Present (06/04/2022)   [employer] of Occupational Health - Occupational Stress Questionnaire    Feeling of Stress : Not at all  Social Connections: Moderately Isolated (11/22/2019)   Social Connection and Isolation Panel [NHANES]    Frequency of Communication with Friends and Family: More than three times a week    Frequency of Social Gatherings with Friends and Family: More than three times a week    Attends Religious Services: Never    Active Member of  Clubs or Organizations: No    Attends Banker Meetings: Never    Marital Status: Married  Catering manager Violence: Not At Risk (06/04/2022)   Humiliation, Afraid, Rape, and Kick questionnaire    Fear of Current or Ex-Partner: No    Emotionally Abused: No    Physically Abused: No    Sexually Abused: No    FAMILY HISTORY: Family History  Problem Relation Age of Onset   Breast cancer Mother 29   Breast cancer Paternal Aunt        3 pat aunts   Liver disease Brother        died 95    ALLERGIES:  is allergic to pneumococcal vaccines, wound dressing adhesive, amoxicillin, codeine, and penicillins.  MEDICATIONS:  Current Outpatient Medications  Medication Sig Dispense Refill   acetaminophen (TYLENOL) 500 MG tablet Take 1,000 mg by mouth at bedtime as needed for moderate pain.     celecoxib (CELEBREX) 200 MG capsule TAKE 1 CAPSULE DAILY 90 capsule 3   cholecalciferol  (VITAMIN D3) 25 MCG (1000 UNIT) tablet Take 1,000 Units by mouth daily.     cyclobenzaprine (FLEXERIL) 10 MG tablet Take 1 tablet (10 mg total) by mouth at bedtime. 30 tablet 1   FREESTYLE LITE test strip TEST BLOOD SUGAR TWICE A DAY 100 strip 6   Lancets (FREESTYLE) lancets TEST BLOOD SUGAR TWICE A DAY 100 each 6   olmesartan-hydrochlorothiazide (BENICAR HCT) 40-25 MG tablet TAKE 1 TABLET DAILY 90 tablet 1   ondansetron (ZOFRAN) 8 MG tablet One pill every 8 hours as needed for nausea/vomitting. 40 tablet 1   OXYGEN Inhale into the lungs at bedtime.     prochlorperazine (COMPAZINE) 10 MG tablet Take 1 tablet (10 mg total) by mouth every 6 (six) hours as needed for nausea or vomiting. 40 tablet 1   TAGRISSO 40 MG tablet TAKE 1 TABLET DAILY. 30 tablet 3   tirzepatide (MOUNJARO) 5 MG/0.5ML Pen Inject 5 mg into the skin once a week. 6 mL 1   glipiZIDE (GLUCOTROL XL) 5 MG 24 hr tablet Take 5 mg by mouth daily with breakfast. (Patient not taking: Reported on 06/05/2022)     No current facility-administered medications for this visit.    Mild to moderate erythema of the left big toe/nail bed.  Nail intact  PHYSICAL EXAMINATION: ECOG PERFORMANCE STATUS: 0 - Asymptomatic  Vitals:   06/05/22 1057  BP: (!) 141/68  Pulse: 67  Resp: 16  Temp: 97.8 F (36.6 C)  SpO2: 99%    Filed Weights   06/05/22 1057  Weight: (!) 300 lb 14.4 oz (136.5 kg)      Physical Exam Constitutional:      Comments: Obese.    HENT:     Head: Normocephalic and atraumatic.     Mouth/Throat:     Pharynx: No oropharyngeal exudate.  Eyes:     Pupils: Pupils are equal, round, and reactive to light.  Cardiovascular:     Rate and Rhythm: Normal rate and regular rhythm.  Pulmonary:     Effort: No respiratory distress.     Breath sounds: No wheezing.     Comments: Clear to auscultation bilaterally. Abdominal:     General: Bowel sounds are normal. There is no distension.     Palpations: Abdomen is soft. There is  no mass.     Tenderness: There is no abdominal tenderness. There is no guarding or rebound.  Musculoskeletal:        General: No  tenderness. Normal range of motion.     Cervical back: Normal range of motion and neck supple.  Skin:    General: Skin is warm.  Neurological:     Mental Status: She is alert and oriented to person, place, and time.  Psychiatric:        Mood and Affect: Affect normal.    Erythema of the ball of the left big toe.  No discharge noted.  LABORATORY DATA:  I have reviewed the data as listed Lab Results  Component Value Date   WBC 5.1 06/05/2022   HGB 14.1 06/05/2022   HCT 44.5 06/05/2022   MCV 85.6 06/05/2022   PLT 181 06/05/2022   Recent Labs    01/28/22 1435 04/01/22 0936 05/31/22 1057 06/05/22 1030  NA 137 136  --  133*  K 4.3 4.3  --  4.4  CL 98 99  --  98  CO2 30 28  --  28  GLUCOSE 135* 147*  --  167*  BUN 19 23  --  21  CREATININE 1.00 1.09* 1.10* 1.20*  CALCIUM 9.4 9.8  --  9.2  GFRNONAA 59* 53*  --  48*  PROT 7.9 7.8  --  7.7  ALBUMIN 3.6 3.7  --  3.6  AST 21 21  --  21  ALT 14 14  --  14  ALKPHOS 61 53  --  57  BILITOT 0.5 0.4  --  0.5    RADIOGRAPHIC STUDIES: I have personally reviewed the radiological images as listed and agreed with the findings in the report.    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 ON Osimertinib 80 mg once a day [07/31/2020] but currently on Osimetinib 40 mg/day [severe fatigue- since July 2023]; ] APRIL 5th, 2024-..-  Stable size and appearance of right upper lobe mass. No new signs of metastatic disease in the chest, abdomen or pelvis.  No evidence of metastatic disease in the abdomen pelvis;  Stable RIGHT adrenal nodule.  # Continue osimertinib at 40 mg/day  no overt progression of disease [see above]; clinically- stable.   # Fatigue: likley sec to osimertinib. Continue  decreased the  dose to 40 mg/day. Stable.   # CKD stage III monitor closely.  Recommend continued hydration control of blood pressure/diabetes.  Stable.   # INCIDENTAL Left sublingual fullness/ Incidental large thyroid goiter/nodule noted- s/p- tongue mass/thyroid nodule- OCT 2023- CT neck-Stable. Monitor for now.   #Chronic joint pains-continue Celebrex every other day [see below]; Tylenol as needed for pain- Stable.   #Diabetes /type II -PBF- BG- 144; currently on Munjauro/ GLP-1 agonist. Stable.   # DISPOSITION:   # follow up in 2 months MD; labs- cbc/cmp/mag;  -Dr.B   # I reviewed the blood work- with the patient in detail; also reviewed the imaging independently [as summarized above]; and with the patient in detail.      All questions were answered. The patient knows to call the clinic with any problems, questions or concerns.    Earna CoderGovinda R Marvelous Woolford, MD 06/05/2022 11:24 AM

## 2022-06-10 NOTE — Discharge Instructions (Signed)

## 2022-06-12 ENCOUNTER — Ambulatory Visit: Payer: Medicare Other | Admitting: Anesthesiology

## 2022-06-12 ENCOUNTER — Encounter: Payer: Self-pay | Admitting: Ophthalmology

## 2022-06-12 ENCOUNTER — Other Ambulatory Visit: Payer: Self-pay

## 2022-06-12 ENCOUNTER — Encounter
Admission: RE | Disposition: A | Discharge status: Home / Self Care | Payer: Self-pay | Source: Home / Self Care | Attending: Ophthalmology

## 2022-06-12 ENCOUNTER — Ambulatory Visit
Admission: RE | Admit: 2022-06-12 | Discharge: 2022-06-12 | Disposition: A | Discharge status: Home / Self Care | Payer: Medicare Other | Attending: Ophthalmology | Admitting: Ophthalmology

## 2022-06-12 DIAGNOSIS — L719 Rosacea, unspecified: Secondary | ICD-10-CM | POA: Diagnosis not present

## 2022-06-12 DIAGNOSIS — F32A Depression, unspecified: Secondary | ICD-10-CM | POA: Diagnosis not present

## 2022-06-12 DIAGNOSIS — E1136 Type 2 diabetes mellitus with diabetic cataract: Secondary | ICD-10-CM | POA: Insufficient documentation

## 2022-06-12 DIAGNOSIS — I10 Essential (primary) hypertension: Secondary | ICD-10-CM | POA: Insufficient documentation

## 2022-06-12 DIAGNOSIS — J849 Interstitial pulmonary disease, unspecified: Secondary | ICD-10-CM | POA: Insufficient documentation

## 2022-06-12 DIAGNOSIS — Z9981 Dependence on supplemental oxygen: Secondary | ICD-10-CM | POA: Diagnosis not present

## 2022-06-12 DIAGNOSIS — Z7984 Long term (current) use of oral hypoglycemic drugs: Secondary | ICD-10-CM | POA: Diagnosis not present

## 2022-06-12 DIAGNOSIS — H2512 Age-related nuclear cataract, left eye: Secondary | ICD-10-CM | POA: Diagnosis not present

## 2022-06-12 DIAGNOSIS — Z6841 Body Mass Index (BMI) 40.0 and over, adult: Secondary | ICD-10-CM | POA: Insufficient documentation

## 2022-06-12 DIAGNOSIS — R0609 Other forms of dyspnea: Secondary | ICD-10-CM | POA: Diagnosis not present

## 2022-06-12 DIAGNOSIS — R0602 Shortness of breath: Secondary | ICD-10-CM | POA: Diagnosis not present

## 2022-06-12 DIAGNOSIS — M1990 Unspecified osteoarthritis, unspecified site: Secondary | ICD-10-CM | POA: Insufficient documentation

## 2022-06-12 DIAGNOSIS — E119 Type 2 diabetes mellitus without complications: Secondary | ICD-10-CM | POA: Diagnosis not present

## 2022-06-12 DIAGNOSIS — D649 Anemia, unspecified: Secondary | ICD-10-CM | POA: Insufficient documentation

## 2022-06-12 HISTORY — PX: CATARACT EXTRACTION W/PHACO: SHX586

## 2022-06-12 LAB — GLUCOSE, CAPILLARY: Glucose-Capillary: 137 mg/dL — ABNORMAL HIGH (ref 70–99)

## 2022-06-12 SURGERY — PHACOEMULSIFICATION, CATARACT, WITH IOL INSERTION
Anesthesia: Monitor Anesthesia Care | Site: Eye | Laterality: Left

## 2022-06-12 MED ORDER — MOXIFLOXACIN HCL 0.5 % OP SOLN
OPHTHALMIC | Status: DC | PRN
  Administered 2022-06-12: .2 mL via OPHTHALMIC

## 2022-06-12 MED ORDER — SIGHTPATH DOSE#1 BSS IO SOLN
INTRAOCULAR | Status: DC | PRN
  Administered 2022-06-12: 60 mL via OPHTHALMIC

## 2022-06-12 MED ORDER — MIDAZOLAM HCL 2 MG/2ML IJ SOLN
INTRAMUSCULAR | Status: DC | PRN
  Administered 2022-06-12: 2 mg via INTRAVENOUS

## 2022-06-12 MED ORDER — TETRACAINE HCL 0.5 % OP SOLN
1.0000 [drp] | OPHTHALMIC | Status: DC | PRN
  Administered 2022-06-12 (×3): 1 [drp] via OPHTHALMIC

## 2022-06-12 MED ORDER — SIGHTPATH DOSE#1 BSS IO SOLN
INTRAOCULAR | Status: DC | PRN
  Administered 2022-06-12: 1 mL via INTRAMUSCULAR

## 2022-06-12 MED ORDER — SIGHTPATH DOSE#1 BSS IO SOLN
INTRAOCULAR | Status: DC | PRN
  Administered 2022-06-12: 15 mL

## 2022-06-12 MED ORDER — FENTANYL CITRATE (PF) 100 MCG/2ML IJ SOLN
INTRAMUSCULAR | Status: DC | PRN
  Administered 2022-06-12: 50 ug via INTRAVENOUS

## 2022-06-12 MED ORDER — BRIMONIDINE TARTRATE-TIMOLOL 0.2-0.5 % OP SOLN
OPHTHALMIC | Status: DC | PRN
  Administered 2022-06-12: 1 [drp] via OPHTHALMIC

## 2022-06-12 MED ORDER — LACTATED RINGERS IV SOLN: INTRAVENOUS | Status: DC

## 2022-06-12 MED ORDER — ONDANSETRON HCL 4 MG/2ML IJ SOLN
INTRAMUSCULAR | Status: DC | PRN
  Administered 2022-06-12: 4 mg via INTRAVENOUS

## 2022-06-12 MED ORDER — ARMC OPHTHALMIC DILATING DROPS
1.0000 | OPHTHALMIC | Status: DC | PRN
  Administered 2022-06-12 (×3): 1 via OPHTHALMIC

## 2022-06-12 MED ORDER — SIGHTPATH DOSE#1 NA HYALUR & NA CHOND-NA HYALUR IO KIT
PACK | INTRAOCULAR | Status: DC | PRN
  Administered 2022-06-12: 1 via OPHTHALMIC

## 2022-06-12 SURGICAL SUPPLY — 9 items
CATARACT SUITE SIGHTPATH (MISCELLANEOUS) ×1 IMPLANT
FEE CATARACT SUITE SIGHTPATH (MISCELLANEOUS) ×1 IMPLANT
GLOVE SRG 8 PF TXTR STRL LF DI (GLOVE) ×1 IMPLANT
GLOVE SURG ENC TEXT LTX SZ7.5 (GLOVE) ×1 IMPLANT
GLOVE SURG UNDER POLY LF SZ8 (GLOVE) ×1
LENS IOL TECNIS EYHANCE 21.0 (Intraocular Lens) IMPLANT
NDL FILTER BLUNT 18X1 1/2 (NEEDLE) ×1 IMPLANT
NEEDLE FILTER BLUNT 18X1 1/2 (NEEDLE) ×1 IMPLANT
SYR 3ML LL SCALE MARK (SYRINGE) ×1 IMPLANT

## 2022-06-12 NOTE — Transfer of Care (Signed)
Immediate Anesthesia Transfer of Care Note  Patient: Megan Shields  Procedure(s) Performed: CATARACT EXTRACTION PHACO AND INTRAOCULAR LENS PLACEMENT (IOC) LEFT DIABETIC  6.12  00:46.2 (Left: Eye)  Patient Location: PACU  Anesthesia Type: MAC  Level of Consciousness: awake, alert  and patient cooperative  Airway and Oxygen Therapy: Patient Spontanous Breathing and Patient connected to supplemental oxygen  Post-op Assessment: Post-op Vital signs reviewed, Patient's Cardiovascular Status Stable, Respiratory Function Stable, Patent Airway and No signs of Nausea or vomiting  Post-op Vital Signs: Reviewed and stable  Complications: No notable events documented.

## 2022-06-12 NOTE — H&P (Signed)
[facility]   Primary Care Physician:  Reubin Milan, MD Ophthalmologist: Dr. Lockie Mola  Pre-Procedure History & Physical: HPI:  Megan Shields is a 77 y.o. female here for ophthalmic surgery.   Past Medical History:  Diagnosis Date   Anemia    vitamin d deficiency   Arthritis    Chronic cough    Depression    Diabetes mellitus without complication    Dyspnea    Hypertension    ILD (interstitial lung disease)    Obesity    BMI 49.44   Pneumonia    Pneumonia due to 2019 novel coronavirus 2022   community acquired also. covid positive May 2022   PONV (postoperative nausea and vomiting)    Primary cancer of right upper lobe of lung 06/30/2020   Rosacea    Shoulder contusion 11/22/2015   Vertigo    none since 3/23    Past Surgical History:  Procedure Laterality Date   BLEPHAROPLASTY Bilateral 12/19/2017   CARPAL TUNNEL RELEASE Bilateral 1985   CATARACT EXTRACTION W/PHACO Right 05/22/2022   Procedure: CATARACT EXTRACTION PHACO AND INTRAOCULAR LENS PLACEMENT (IOC) RIGHT DIABETIC;  Surgeon: Lockie Mola, MD;  Location: [facility];  Service: Ophthalmology;  Laterality: Right;  3.79 0:41.1   CHOLECYSTECTOMY     COLONOSCOPY WITH PROPOFOL N/A 03/07/2017   Procedure: COLONOSCOPY WITH PROPOFOL;  Surgeon: Wyline Mood, MD;  Location: [facility] ENDOSCOPY;  Service: Gastroenterology;  Laterality: N/A;   DILATION AND CURETTAGE OF UTERUS  12/2011   benign endometrial polyp   HALLUX VALGUS AUSTIN Right 09/02/2014   Procedure: HALLUX VALGUS AUSTIN;  Surgeon: Gwyneth Revels, DPM;  Location: [facility] ORS;  Service: Podiatry;  Laterality: Right;   HERNIA REPAIR     JOINT REPLACEMENT Bilateral    TKR   LAPAROSCOPIC GASTRIC BANDING  2010   Lap Band   LAPAROSCOPIC TOTAL HYSTERECTOMY  12/23/2019   lymph node removed Right    TOTAL KNEE ARTHROPLASTY Bilateral 2003, 2004   TOTAL KNEE ARTHROPLASTY Bilateral    VENTRAL HERNIA REPAIR  2008    Prior to Admission  medications   Medication Sig Start Date End Date Taking? Authorizing Provider  acetaminophen (TYLENOL) 500 MG tablet Take 1,000 mg by mouth at bedtime as needed for moderate pain.   Yes [provider]  celecoxib (CELEBREX) 200 MG capsule TAKE 1 CAPSULE DAILY 08/30/21  Yes Reubin Milan, MD  cholecalciferol (VITAMIN D3) 25 MCG (1000 UNIT) tablet Take 1,000 Units by mouth daily.   Yes [provider]  cyclobenzaprine (FLEXERIL) 10 MG tablet Take 1 tablet (10 mg total) by mouth at bedtime. 11/26/21  Yes Reubin Milan, MD  FREESTYLE LITE test strip TEST BLOOD SUGAR TWICE A DAY 02/15/20  Yes Reubin Milan, MD  glipiZIDE (GLUCOTROL XL) 5 MG 24 hr tablet Take 5 mg by mouth daily with breakfast.   Yes [provider]  Lancets (FREESTYLE) lancets TEST BLOOD SUGAR TWICE A DAY 02/15/20  Yes Reubin Milan, MD  olmesartan-hydrochlorothiazide [facility] HCT) 40-25 MG tablet TAKE 1 TABLET DAILY 01/31/22  Yes Reubin Milan, MD  ondansetron (ZOFRAN) 8 MG tablet One pill every 8 hours as needed for nausea/vomitting. 06/30/20  Yes Earna Coder, MD  OXYGEN Inhale into the lungs at bedtime.   Yes [provider]  prochlorperazine (COMPAZINE) 10 MG tablet Take 1 tablet (10 mg total) by mouth every 6 (six) hours as needed for nausea or vomiting. 01/28/22  Yes Earna Coder, MD  TAGRISSO 40 MG tablet TAKE 1 TABLET DAILY. 04/10/22  Yes Earna Coder, MD  tirzepatide [facility]) 5 MG/0.5ML Pen Inject 5 mg into the skin once a week. 06/04/22  Yes Reubin Milan, MD    Allergies as of 04/26/2022 - Review Complete 04/01/2022  Allergen Reaction Noted   Pneumococcal vaccines Hives 02/27/2015   Wound dressing adhesive Rash 07/17/2020   Amoxicillin Rash 03/08/2015   Codeine  08/01/2014   Penicillins Rash 05/04/2020    Family History  Problem Relation Age of Onset   Breast cancer Mother 59   Breast cancer Paternal Aunt        3 pat aunts   Liver  disease Brother        died 59    Social History   Socioeconomic History   Marital status: Married    Spouse name: Peyton Najjar   Number of children: Not on file   Years of education: Not on file   Highest education level: Not on file  Occupational History   Occupation: Therapist, music    Comment: RETIRED  Tobacco Use   Smoking status: Never   Smokeless tobacco: Never  Vaping Use   Vaping Use: Never used  Substance and Sexual Activity   Alcohol use: No    Alcohol/week: 0.0 standard drinks of alcohol   Drug use: No   Sexual activity: Not Currently  Other Topics Concern   Not on file  Social History Narrative   Never smoked; no alcohol. Lives in [place] with husband . Home maker.    Daughter lives next door. 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   Social Determinants of Health   Financial Resource Strain: Low Risk  (06/04/2022)   Overall Financial Resource Strain (CARDIA)    Difficulty of Paying Living Expenses: Not hard at all  Food Insecurity: No Food Insecurity (06/04/2022)   Hunger Vital Sign    Worried About Running Out of Food in the Last Year: Never true    Ran Out of Food in the Last Year: Never true  Transportation Needs: No Transportation Needs (06/04/2022)   PRAPARE - Administrator, Civil Service (Medical): No    Lack of Transportation (Non-Medical): No  Physical Activity: Inactive (06/04/2022)   Exercise Vital Sign    Days of Exercise per Week: 0 days    Minutes of Exercise per Session: 0 min  Stress: No Stress Concern Present (06/04/2022)   [employer] of Occupational Health - Occupational Stress Questionnaire    Feeling of Stress : Not at all  Social Connections: Moderately Isolated (11/22/2019)   Social Connection and Isolation Panel [NHANES]    Frequency of Communication with Friends and Family: More than three times a week    Frequency of Social Gatherings with Friends and Family: More than three times a week    Attends Religious Services:  Never    Database administrator or Organizations: No    Attends Banker Meetings: Never    Marital Status: Married  Catering manager Violence: Not At Risk (06/04/2022)   Humiliation, Afraid, Rape, and Kick questionnaire    Fear of Current or Ex-Partner: No    Emotionally Abused: No    Physically Abused: No    Sexually Abused: No    Review of Systems: See HPI, otherwise negative ROS  Physical Exam: BP (!) 165/85   Temp (!) 97.3 F (36.3 C) (Temporal)   Resp (!) 22   Wt (!) 136.9 kg  SpO2 95%   BMI 51.80 kg/m  General:   Alert,  pleasant and cooperative in NAD Head:  Normocephalic and atraumatic. Lungs:  Clear to auscultation.    Heart:  Regular rate and rhythm.    Impression/Plan: Megan Shields is here for ophthalmic surgery.  Risks, benefits, limitations, and alternatives regarding ophthalmic surgery have been reviewed with the patient.  Questions have been answered.  All parties agreeable.   Lockie Mola, MD  06/12/2022, 7:32 AM

## 2022-06-12 NOTE — Anesthesia Preprocedure Evaluation (Addendum)
Anesthesia Evaluation  Patient identified by MRN, date of birth, ID band Patient awake    Reviewed: Allergy & Precautions, H&P , NPO status , Patient's Chart, lab work & pertinent test results  History of Anesthesia Complications (+) PONV and history of anesthetic complications  Airway Mallampati: III  TM Distance: <3 FB Neck ROM: Full    Dental no notable dental hx.  Bridge, caps, none loose:   Pulmonary shortness of breath, with exertion, lying and Long-Term Oxygen Therapy, pneumonia Uses oxygen at night and occasionally during the day. Can lie flat if she has oxygen per nasal cannula. Cannot lie flat without oxygen.    Pulmonary exam normal breath sounds clear to auscultation       Cardiovascular Exercise Tolerance: Poor hypertension, + DOE  Normal cardiovascular exam Rhythm:Regular Rate:Normal     Neuro/Psych  PSYCHIATRIC DISORDERS  Depression     Neuromuscular disease negative neurological ROS  negative psych ROS   GI/Hepatic negative GI ROS, Neg liver ROS,,,  Endo/Other  diabetes, Type 2, Oral Hypoglycemic Agents  Morbid obesity  Renal/GU negative Renal ROS  negative genitourinary   Musculoskeletal negative musculoskeletal ROS (+) Arthritis ,    Abdominal  (+) + obese  Peds negative pediatric ROS (+)  Hematology negative hematology ROS (+) Blood dyscrasia, anemia   Anesthesia Other Findings Hypertension  Arthritis PONV (postoperative nausea and vomiting)  Diabetes mellitus without complication Shoulder contusion Dyspnea Pneumonia  Depression Primary cancer of right upper lobe of lung Covid pneumonia hx Chronic cough A nemia ILD (interstitial lung disease)  Rosacea Morbid Obesity  Vertigo    Reproductive/Obstetrics negative OB ROS                             Anesthesia Physical Anesthesia Plan  ASA: 3  Anesthesia Plan: MAC   Post-op Pain Management:    Induction:  Intravenous  PONV Risk Score and Plan:   Airway Management Planned: Natural Airway and Nasal Cannula  Additional Equipment:   Intra-op Plan:   Post-operative Plan:   Informed Consent: I have reviewed the patients History and Physical, chart, labs and discussed the procedure including the risks, benefits and alternatives for the proposed anesthesia with the patient or authorized representative who has indicated his/her understanding and acceptance.     Dental Advisory Given  Plan Discussed with: Anesthesiologist, CRNA and Surgeon  Anesthesia Plan Comments: (Patient consented for risks of anesthesia including but not limited to:  - adverse reactions to medications - damage to eyes, teeth, lips or other oral mucosa - nerve damage due to positioning  - sore throat or hoarseness - Damage to heart, brain, nerves, lungs, other parts of body or loss of life  Patient voiced understanding.)       Anesthesia Quick Evaluation

## 2022-06-12 NOTE — Op Note (Signed)
OPERATIVE NOTE  Megan Shields [id number] 06/12/2022   PREOPERATIVE DIAGNOSIS:  Nuclear sclerotic cataract left eye. H25.12   POSTOPERATIVE DIAGNOSIS:    Nuclear sclerotic cataract left eye.     PROCEDURE:  Phacoemusification with posterior chamber intraocular lens placement of the left eye  Ultrasound time: Procedure(s) with comments: CATARACT EXTRACTION PHACO AND INTRAOCULAR LENS PLACEMENT (IOC) LEFT DIABETIC  6.12  00:46.2 (Left) - Diabetic  LENS:   Implant Name Type Inv. Item Serial No. Manufacturer Lot No. LRB No. Used Action  LENS IOL TECNIS EYHANCE 21.0 - [id number] Intraocular Lens LENS IOL TECNIS EYHANCE 21.0 [id number] [employer]  Left 1 Implanted      SURGEON:  Deirdre Evener, MD   ANESTHESIA:  Topical with tetracaine drops and 2% Xylocaine jelly, augmented with 1% preservative-free intracameral lidocaine.    COMPLICATIONS:  None.   DESCRIPTION OF PROCEDURE:  The patient was identified in the holding room and transported to the operating room and placed in the supine position under the operating microscope.  The left eye was identified as the operative eye and it was prepped and draped in the usual sterile ophthalmic fashion.   A 1 millimeter clear-corneal paracentesis was made at the 1:30 position.  0.5 ml of preservative-free 1% lidocaine was injected into the anterior chamber.  The anterior chamber was filled with Viscoat viscoelastic.  A 2.4 millimeter keratome was used to make a near-clear corneal incision at the 10:30 position.  .  A curvilinear capsulorrhexis was made with a cystotome and capsulorrhexis forceps.  Balanced salt solution was used to hydrodissect and hydrodelineate the nucleus.   Phacoemulsification was then used in stop and chop fashion to remove the lens nucleus and epinucleus.  The remaining cortex was then removed using the irrigation and aspiration handpiece. Provisc was then placed into the capsular bag to distend it for lens placement.  A  lens was then injected into the capsular bag.  The remaining viscoelastic was aspirated.   Wounds were hydrated with balanced salt solution.  The anterior chamber was inflated to a physiologic pressure with balanced salt solution.  No wound leaks were noted. Vigamox 0.2 ml of a  per ml solution was injected into the anterior chamber for a dose of 0.2 mg of intracameral antibiotic at the completion of the case.   Timolol and Brimonidine drops were applied to the eye.  The patient was taken to the recovery room in stable condition without complications of anesthesia or surgery.  Lido Maske 06/12/2022, 7:52 AM;

## 2022-06-12 NOTE — Anesthesia Postprocedure Evaluation (Signed)
Anesthesia Post Note  Patient: Megan Shields  Procedure(s) Performed: CATARACT EXTRACTION PHACO AND INTRAOCULAR LENS PLACEMENT (IOC) LEFT DIABETIC  6.12  00:46.2 (Left: Eye)  Patient location during evaluation: PACU Anesthesia Type: MAC Level of consciousness: awake and alert Pain management: pain level controlled Vital Signs Assessment: post-procedure vital signs reviewed and stable Respiratory status: spontaneous breathing, nonlabored ventilation, respiratory function stable and patient connected to nasal cannula oxygen Cardiovascular status: stable and blood pressure returned to baseline Postop Assessment: no apparent nausea or vomiting Anesthetic complications: no   No notable events documented.   Last Vitals:  Vitals:   06/12/22 0753 06/12/22 0757  BP: (!) 147/70 119/72  Pulse: 76 74  Resp: 18 18  Temp: 36.6 C 36.6 C  SpO2: 93% 93%    Last Pain:  Vitals:   06/12/22 0757  TempSrc:   PainSc: 0-No pain                 Marisue Humble

## 2022-06-13 ENCOUNTER — Encounter: Payer: Self-pay | Admitting: Ophthalmology

## 2022-07-12 DIAGNOSIS — Z961 Presence of intraocular lens: Secondary | ICD-10-CM | POA: Diagnosis not present

## 2022-07-22 ENCOUNTER — Other Ambulatory Visit: Payer: Self-pay | Admitting: Internal Medicine

## 2022-07-22 DIAGNOSIS — C3411 Malignant neoplasm of upper lobe, right bronchus or lung: Secondary | ICD-10-CM

## 2022-07-22 DIAGNOSIS — M170 Bilateral primary osteoarthritis of knee: Secondary | ICD-10-CM

## 2022-07-23 ENCOUNTER — Encounter: Payer: Self-pay | Admitting: Internal Medicine

## 2022-07-23 NOTE — Telephone Encounter (Signed)
CBC with Differential Order: [id number] Status: Final result     Visible to patient: Yes (not seen)     Next appt: 08/05/2022 at 10:00 AM in Oncology ([facility])     Dx: Primary cancer of right upper lobe of...   0 Result Notes          Component Ref Range & Units 1 mo ago 3 mo ago 5 mo ago 8 mo ago 9 mo ago 10 mo ago 1 yr ago  WBC 4.0 - 10.5 K/uL 5.1 4.2 5.8 4.6 4.4 3.3 Low  4.5  RBC 3.87 - 5.11 MIL/uL 5.20 High  5.38 High  5.08 4.82 4.70 4.91 4.90  Hemoglobin 12.0 - 15.0 g/dL 40.9 81.1 91.4 78.2 95.6 13.4 13.0  HCT 36.0 - 46.0 % 44.5 45.7 43.2 41.3 41.0 42.1 42.0  MCV 80.0 - 100.0 fL 85.6 84.9 85.0 85.7 87.2 85.7 85.7  MCH 26.0 - 34.0 pg 27.1 27.5 27.4 27.4 27.4 27.3 26.5  MCHC 30.0 - 36.0 g/dL 21.3 08.6 57.8 46.9 62.9 31.8 31.0  RDW 11.5 - 15.5 % 14.1 13.9 13.8 13.9 14.0 14.1 13.9  Platelets 150 - 400 K/uL 181 173 191 178 170 155 157  nRBC 0.0 - 0.2 % 0.0 0.0 0.0 0.0 0.0 0.0 0.0  Neutrophils Relative % % 66 60 61 63 62 60 63  Neutro Abs 1.7 - 7.7 K/uL 3.3 2.5 3.6 2.9 2.8 2.0 2.8  Lymphocytes Relative % 17 21 22 20 20 24 20   Lymphs Abs 0.7 - 4.0 K/uL 0.9 0.9 1.3 0.9 0.9 0.8 0.9  Monocytes Relative % 11 13 11 12 12 10 11   Monocytes Absolute 0.1 - 1.0 K/uL 0.6 0.6 0.7 0.5 0.5 0.3 0.5  Eosinophils Relative % 6 5 5 5 4 5 4   Eosinophils Absolute 0.0 - 0.5 K/uL 0.3 0.2 0.3 0.2 0.2 0.2 0.2  Basophils Relative % 0 1 1 0 1 1 0  Basophils Absolute 0.0 - 0.1 K/uL 0.0 0.0 0.0 0.0 0.0 0.0 0.0  Immature Granulocytes % 0 0 0 0 1 0 2  Abs Immature Granulocytes 0.00 - 0.07 K/uL 0.01 0.01 CM 0.01 CM 0.02 CM 0.03 CM 0.00 CM 0.07 CM  Comment: Performed at [facility], [address] [place], [place] [address]  Resulting Agency [facility]         Specimen Collected: 06/05/22 10:30 Last Resulted: 06/05/22 10:37                   Component Ref Range & Units 1 mo ago 3 mo ago 5 mo ago 8  mo ago 9 mo ago 10 mo ago 1 yr ago  Magnesium 1.7 - 2.4 mg/dL 1.8 1.7 CM 1.7 CM 1.7 CM 1.8 CM 1.9 CM 1.7 CM  Comment: Performed at [facility], [address] [place], [place] [address]  Resulting Agency [facility]    Comprehensive metabolic panel Order: [id number] Status: Final result     Visible to patient: Yes (seen)     Next appt: 08/05/2022 at 10:00 AM in Oncology ([facility])     Dx: Primary cancer of right upper lobe of...   0 Result Notes     1 HM Topic  Component Ref Range & Units 1 mo ago (06/05/22) 1 mo ago (05/31/22) 3 mo ago (04/01/22) 5 mo ago (01/28/22) 8 mo ago (11/22/21) 9 mo ago (10/11/21) 10 mo ago (08/30/21)  Sodium 135 - 145 mmol/L 133 Low   136 137 136 137 132 Low   Potassium 3.5 - 5.1 mmol/L 4.4  4.3 4.3 4.3 4.1 4.2  Chloride 98 - 111 mmol/L 98  99 98 99 98 97 Low   CO2 22 - 32 mmol/L 28  28 30  32 33 High  29  Glucose, Bld 70 - 99 mg/dL 829 High   562 High  CM 135 High  CM 156 High  CM 189 High  CM 184 High  CM  Comment: Glucose reference range applies only to samples taken after fasting for at least 8 hours.  BUN 8 - 23 mg/dL 21  23 19 17 17 19   Creatinine, Ser 0.44 - 1.00 mg/dL 1.30 High  8.65 High  7.84 High  1.00 0.93 0.91 1.13 High   Calcium 8.9 - 10.3 mg/dL 9.2  9.8 9.4 9.0 9.1 8.9  Total Protein 6.5 - 8.1 g/dL 7.7  7.8 7.9 7.4 7.6 7.5  Albumin 3.5 - 5.0 g/dL 3.6  3.7 3.6 3.4 Low  3.4 Low  3.4 Low   AST 15 - 41 U/L 21  21 21 18 21 19   ALT 0 - 44 U/L 14  14 14 12 13 12   Alkaline Phosphatase 38 - 126 U/L 57  53 61 57 57 52  Total Bilirubin 0.3 - 1.2 mg/dL 0.5  0.4 0.5 0.5 0.7 0.6  GFR, Estimated >60 mL/min 48 Low   53 Low  CM 59 Low  CM >60 CM >60 CM 51 Low  CM  Comment: (NOTE) Calculated using the CKD-EPI Creatinine Equation (2021)  Anion gap 5 - 15 7  9  CM 9 CM 5 CM 6 CM 6 CM  Comment: Performed at [facility], [address]  [place], [place] [address]  Resulting Agency [facility]

## 2022-07-26 ENCOUNTER — Other Ambulatory Visit: Payer: Self-pay

## 2022-07-26 ENCOUNTER — Encounter: Payer: Self-pay | Admitting: Internal Medicine

## 2022-07-26 DIAGNOSIS — I10 Essential (primary) hypertension: Secondary | ICD-10-CM

## 2022-07-26 MED ORDER — OLMESARTAN MEDOXOMIL-HCTZ 40-25 MG PO TABS
1.0000 | ORAL_TABLET | Freq: Every day | ORAL | 0 refills | Status: DC
Start: 2022-07-26 — End: 2022-10-07

## 2022-08-05 ENCOUNTER — Encounter: Payer: Self-pay | Admitting: Internal Medicine

## 2022-08-05 ENCOUNTER — Inpatient Hospital Stay: Payer: Medicare Other | Attending: Internal Medicine

## 2022-08-05 ENCOUNTER — Inpatient Hospital Stay (HOSPITAL_BASED_OUTPATIENT_CLINIC_OR_DEPARTMENT_OTHER): Payer: Medicare Other | Admitting: Internal Medicine

## 2022-08-05 VITALS — BP 159/54 | HR 74 | Temp 97.9°F | Ht 64.0 in | Wt 296.2 lb

## 2022-08-05 DIAGNOSIS — Z803 Family history of malignant neoplasm of breast: Secondary | ICD-10-CM | POA: Insufficient documentation

## 2022-08-05 DIAGNOSIS — C3411 Malignant neoplasm of upper lobe, right bronchus or lung: Secondary | ICD-10-CM

## 2022-08-05 DIAGNOSIS — N183 Chronic kidney disease, stage 3 unspecified: Secondary | ICD-10-CM | POA: Insufficient documentation

## 2022-08-05 DIAGNOSIS — R0609 Other forms of dyspnea: Secondary | ICD-10-CM | POA: Diagnosis not present

## 2022-08-05 DIAGNOSIS — E0789 Other specified disorders of thyroid: Secondary | ICD-10-CM

## 2022-08-05 DIAGNOSIS — I129 Hypertensive chronic kidney disease with stage 1 through stage 4 chronic kidney disease, or unspecified chronic kidney disease: Secondary | ICD-10-CM | POA: Insufficient documentation

## 2022-08-05 DIAGNOSIS — Z791 Long term (current) use of non-steroidal anti-inflammatories (NSAID): Secondary | ICD-10-CM | POA: Insufficient documentation

## 2022-08-05 DIAGNOSIS — E1122 Type 2 diabetes mellitus with diabetic chronic kidney disease: Secondary | ICD-10-CM | POA: Insufficient documentation

## 2022-08-05 DIAGNOSIS — Z7984 Long term (current) use of oral hypoglycemic drugs: Secondary | ICD-10-CM | POA: Diagnosis not present

## 2022-08-05 DIAGNOSIS — Z79899 Other long term (current) drug therapy: Secondary | ICD-10-CM | POA: Insufficient documentation

## 2022-08-05 LAB — CMP (CANCER CENTER ONLY)
ALT: 14 U/L (ref 0–44)
AST: 20 U/L (ref 15–41)
Albumin: 3.6 g/dL (ref 3.5–5.0)
Alkaline Phosphatase: 48 U/L (ref 38–126)
Anion gap: 6 (ref 5–15)
BUN: 24 mg/dL — ABNORMAL HIGH (ref 8–23)
CO2: 28 mmol/L (ref 22–32)
Calcium: 9 mg/dL (ref 8.9–10.3)
Chloride: 100 mmol/L (ref 98–111)
Creatinine: 1.15 mg/dL — ABNORMAL HIGH (ref 0.44–1.00)
GFR, Estimated: 50 mL/min — ABNORMAL LOW (ref >60)
Glucose, Bld: 137 mg/dL — ABNORMAL HIGH (ref 70–99)
Potassium: 4.4 mmol/L (ref 3.5–5.1)
Sodium: 134 mmol/L — ABNORMAL LOW (ref 135–145)
Total Bilirubin: 0.4 mg/dL (ref 0.3–1.2)
Total Protein: 7.8 g/dL (ref 6.5–8.1)

## 2022-08-05 LAB — MAGNESIUM: Magnesium: 1.8 mg/dL (ref 1.7–2.4)

## 2022-08-05 LAB — CBC WITH DIFFERENTIAL (CANCER CENTER ONLY)
Abs Immature Granulocytes: 0.01 10*3/uL (ref 0.00–0.07)
Basophils Absolute: 0 10*3/uL (ref 0.0–0.1)
Basophils Relative: 0 %
Eosinophils Absolute: 0.2 10*3/uL (ref 0.0–0.5)
Eosinophils Relative: 5 %
HCT: 43 % (ref 36.0–46.0)
Hemoglobin: 13.9 g/dL (ref 12.0–15.0)
Immature Granulocytes: 0 %
Lymphocytes Relative: 21 %
Lymphs Abs: 0.9 10*3/uL (ref 0.7–4.0)
MCH: 27.6 pg (ref 26.0–34.0)
MCHC: 32.3 g/dL (ref 30.0–36.0)
MCV: 85.3 fL (ref 80.0–100.0)
Monocytes Absolute: 0.4 10*3/uL (ref 0.1–1.0)
Monocytes Relative: 11 %
Neutro Abs: 2.5 10*3/uL (ref 1.7–7.7)
Neutrophils Relative %: 63 %
Platelet Count: 189 10*3/uL (ref 150–400)
RBC: 5.04 MIL/uL (ref 3.87–5.11)
RDW: 14.1 % (ref 11.5–15.5)
WBC Count: 4 10*3/uL (ref 4.0–10.5)
nRBC: 0 % (ref 0.0–0.2)

## 2022-08-05 NOTE — Assessment & Plan Note (Addendum)
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 ON Osimertinib 80 mg once a day [07/31/2020] but currently on Osimetinib 40 mg/day [severe fatigue- since July 2023]; ] APRIL 5th, 2024-Stable size and appearance of right upper lobe mass. No new signs of metastatic disease in the chest, abdomen or pelvis.  No evidence of metastatic disease in the abdomen pelvis;  Stable RIGHT adrenal nodule.  # Continue osimertinib at 40 mg/day  no overt progression of disease [see above]; clinically- stable.  Will repeat CT Chest/Neck- in 3 month.    # Fatigue: likley sec to osimertinib. Continue  decreased the dose to 40 mg/day. stable.  # CKD stage III monitor closely.  Recommend continued hydration control of blood pressure/diabetes.  Stable.   # INCIDENTAL Left sublingual fullness/ Incidental large thyroid goiter/nodule noted- s/p- tongue mass/thyroid nodule- OCT 2023- CT neck-stable.Monitor for now.   #Chronic joint pains-continue Celebrex every other day [see below]; Tylenol as needed for pain- stable.  #Diabetes /type II -PBF- BG- 137; currently on Munjauro/ GLP-1 agonist.stable.   # DISPOSITION:   # follow up in 3  months MD; labs- cbc/cmp/mag; 1 week PRIOR-CT CAP; neck CT scan -Dr.B

## 2022-08-05 NOTE — Progress Notes (Signed)
[facility] CONSULT NOTE  Patient Care Team: Reubin Milan, MD as PCP - General (Internal Medicine) [facility] (Ophthalmology) Jesusita Oka, MD (Dermatology) Glory Buff, RN as Oncology Nurse Navigator Earna Coder, MD as Consulting Physician (Oncology) Lockie Mola, MD as Referring Physician (Ophthalmology)  CHIEF COMPLAINTS/PURPOSE OF CONSULTATION: lung cancer  #  Oncology History Overview Note  # April-MAY 2022- 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  [Dr.Byrnett & Dr.Fleming]; May 2022-MRI brain-NEG.   # MAY 11th, 2022- carbo-alimta x1 cycle; discontinued; June 6th, 2022-osimertinib 80 mg a day  #Thyroid -enlargement; recommend biopsy per radiology; patient /husband has been reluctant  # Incidental-out of place Gastric band [since 2014] gastric band.  Asymptomatic monitor for now  # NGS/MOLECULAR TESTS:POSITIVE for EGFR mutation deletion 19    # PALLIATIVE CARE EVALUATION:  # PAIN MANAGEMENT:    DIAGNOSIS: Lung cancer  STAGE:   IIIC      ;  GOALS:Palliatve  CURRENT/MOST RECENT THERAPY : Osiemertinib      Primary cancer of right upper lobe of lung (HCC)  06/30/2020 Initial Diagnosis   Primary cancer of right upper lobe of lung (HCC)   06/30/2020 Cancer Staging   Staging form: Lung, AJCC 8th Edition - Clinical: Stage IIIC (cT4, cN3, cM0) - Signed by Earna Coder, MD on 06/30/2020 Histopathologic type: Adenocarcinoma, NOS   07/05/2020 - 07/05/2020 Chemotherapy   Patient is on Treatment Plan : LUNG CARBOplatin / Pemetrexed / Pembrolizumab q21d Induction x 4 cycles / Maintenance Pemetrexed + Pembrolizumab      HISTORY OF PRESENTING ILLNESS:  She is accompanied by husband.  Ambulating independently.   Megan Shields 77 y.o.  female  Stage IV/adenocarcinoma the lung EGFR mutated-currently on osimertinib is here for  follow-up.  Pt has have chronic mild dyspnea.  Otherwise denies any worsening shortness of breath or cough.  Admits to compliance with her Tagrisso.    Denies any chest pain. Mild chronic Swelling in the legs. No nausea no vomiting .  No diarrhea.  Chronic joint pains.   Review of Systems  Constitutional:  Positive for malaise/fatigue. Negative for chills, diaphoresis, fever and weight loss.  HENT:  Negative for nosebleeds and sore throat.   Eyes:  Negative for double vision.  Respiratory:  Negative for hemoptysis, sputum production and wheezing.   Cardiovascular:  Negative for chest pain, palpitations, orthopnea and leg swelling.  Gastrointestinal:  Negative for abdominal pain, blood in stool, constipation, diarrhea, heartburn, melena, nausea and vomiting.  Genitourinary:  Negative for dysuria, frequency and urgency.  Musculoskeletal:  Negative for back pain and joint pain.  Neurological:  Negative for dizziness, tingling, focal weakness, weakness and headaches.  Endo/Heme/Allergies:  Does not bruise/bleed easily.  Psychiatric/Behavioral:  Negative for depression. The patient is not nervous/anxious and does not have insomnia.      MEDICAL HISTORY:  Past Medical History:  Diagnosis Date   Anemia    vitamin d deficiency   Arthritis    Chronic cough    Depression    Diabetes mellitus without complication (HCC)    Dyspnea    Hypertension    ILD (interstitial lung disease) (HCC)    Obesity    BMI 49.44   Pneumonia    Pneumonia due to 2019 novel coronavirus 2022   community acquired also. covid positive May 2022   PONV (postoperative nausea and vomiting)    Primary cancer of right upper lobe of lung (  HCC) 06/30/2020   Rosacea    Shoulder contusion 11/22/2015   Vertigo    none since 3/23    SURGICAL HISTORY: Past Surgical History:  Procedure Laterality Date   BLEPHAROPLASTY Bilateral 12/19/2017   CARPAL TUNNEL RELEASE Bilateral 1985   CATARACT EXTRACTION W/PHACO Right  05/22/2022   Procedure: CATARACT EXTRACTION PHACO AND INTRAOCULAR LENS PLACEMENT (IOC) RIGHT DIABETIC;  Surgeon: Lockie Mola, MD;  Location: [facility];  Service: Ophthalmology;  Laterality: Right;  3.79 0:41.1   CATARACT EXTRACTION W/PHACO Left 06/12/2022   Procedure: CATARACT EXTRACTION PHACO AND INTRAOCULAR LENS PLACEMENT (IOC) LEFT DIABETIC  6.12  00:46.2;  Surgeon: Lockie Mola, MD;  Location: [facility];  Service: Ophthalmology;  Laterality: Left;  Diabetic   CHOLECYSTECTOMY     COLONOSCOPY WITH PROPOFOL N/A 03/07/2017   Procedure: COLONOSCOPY WITH PROPOFOL;  Surgeon: Wyline Mood, MD;  Location: [facility];  Service: Gastroenterology;  Laterality: N/A;   DILATION AND CURETTAGE OF UTERUS  12/2011   benign endometrial polyp   HALLUX VALGUS AUSTIN Right 09/02/2014   Procedure: HALLUX VALGUS AUSTIN;  Surgeon: Gwyneth Revels, DPM;  Location: [facility];  Service: Podiatry;  Laterality: Right;   HERNIA REPAIR     JOINT REPLACEMENT Bilateral    TKR   LAPAROSCOPIC GASTRIC BANDING  2010   Lap Band   LAPAROSCOPIC TOTAL HYSTERECTOMY  12/23/2019   lymph node removed Right    TOTAL KNEE ARTHROPLASTY Bilateral 2003, 2004   TOTAL KNEE ARTHROPLASTY Bilateral    VENTRAL HERNIA REPAIR  2008    SOCIAL HISTORY: Social History   Socioeconomic History   Marital status: Married    Spouse name: Peyton Najjar   Number of children: Not on file   Years of education: Not on file   Highest education level: Not on file  Occupational History   Occupation: Therapist, music    Comment: RETIRED  Tobacco Use   Smoking status: Never   Smokeless tobacco: Never  Vaping Use   Vaping Use: Never used  Substance and Sexual Activity   Alcohol use: No    Alcohol/week: 0.0 standard drinks of alcohol   Drug use: No   Sexual activity: Not Currently  Other Topics Concern   Not on file  Social History Narrative   Never smoked; no alcohol. Lives in [place] with husband . Home maker.     Daughter lives next door. 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   Social Determinants of Health   Financial Resource Strain: Low Risk  (06/04/2022)   Overall Financial Resource Strain (CARDIA)    Difficulty of Paying Living Expenses: Not hard at all  Food Insecurity: No Food Insecurity (06/04/2022)   Hunger Vital Sign    Worried About Running Out of Food in the Last Year: Never true    Ran Out of Food in the Last Year: Never true  Transportation Needs: No Transportation Needs (06/04/2022)   PRAPARE - Administrator, Civil Service (Medical): No    Lack of Transportation (Non-Medical): No  Physical Activity: Inactive (06/04/2022)   Exercise Vital Sign    Days of Exercise per Week: 0 days    Minutes of Exercise per Session: 0 min  Stress: No Stress Concern Present (06/04/2022)   [employer] of Occupational Health - Occupational Stress Questionnaire    Feeling of Stress : Not at all  Social Connections: Moderately Isolated (11/22/2019)   Social Connection and Isolation Panel [NHANES]    Frequency of Communication with Friends and  Family: More than three times a week    Frequency of Social Gatherings with Friends and Family: More than three times a week    Attends Religious Services: Never    Database administrator or Organizations: No    Attends Banker Meetings: Never    Marital Status: Married  Catering manager Violence: Not At Risk (06/04/2022)   Humiliation, Afraid, Rape, and Kick questionnaire    Fear of Current or Ex-Partner: No    Emotionally Abused: No    Physically Abused: No    Sexually Abused: No    FAMILY HISTORY: Family History  Problem Relation Age of Onset   Breast cancer Mother 34   Breast cancer Paternal Aunt        3 pat aunts   Liver disease Brother        died 71    ALLERGIES:  is allergic to pneumococcal vaccines, wound dressing adhesive, amoxicillin, codeine, and penicillins.  MEDICATIONS:  Current Outpatient Medications   Medication Sig Dispense Refill   acetaminophen (TYLENOL) 500 MG tablet Take 1,000 mg by mouth at bedtime as needed for moderate pain.     celecoxib (CELEBREX) 200 MG capsule TAKE 1 CAPSULE DAILY 90 capsule 3   cholecalciferol (VITAMIN D3) 25 MCG (1000 UNIT) tablet Take 1,000 Units by mouth daily.     cyclobenzaprine (FLEXERIL) 10 MG tablet Take 1 tablet (10 mg total) by mouth at bedtime. 30 tablet 1   FREESTYLE LITE test strip TEST BLOOD SUGAR TWICE A DAY 100 strip 6   glipiZIDE (GLUCOTROL XL) 5 MG 24 hr tablet Take 5 mg by mouth daily with breakfast.     Lancets (FREESTYLE) lancets TEST BLOOD SUGAR TWICE A DAY 100 each 6   olmesartan-hydrochlorothiazide (BENICAR HCT) 40-25 MG tablet Take 1 tablet by mouth daily. 90 tablet 0   ondansetron (ZOFRAN) 8 MG tablet One pill every 8 hours as needed for nausea/vomitting. 40 tablet 1   OXYGEN Inhale into the lungs at bedtime.     prochlorperazine (COMPAZINE) 10 MG tablet TAKE 1 TABLET EVERY 6 HOURS AS NEEDED FOR NAUSEA OR VOMITING 40 tablet 35   TAGRISSO 40 MG tablet TAKE 1 TABLET DAILY. 30 tablet 3   tirzepatide (MOUNJARO) 5 MG/0.5ML Pen Inject 5 mg into the skin once a week. 6 mL 1   No current facility-administered medications for this visit.    Mild to moderate erythema of the left big toe/nail bed.  Nail intact  PHYSICAL EXAMINATION: ECOG PERFORMANCE STATUS: 0 - Asymptomatic  Vitals:   08/05/22 0953  BP: (!) 159/54  Pulse: 74  Temp: 97.9 F (36.6 C)  SpO2: 98%    Filed Weights   08/05/22 0953  Weight: 296 lb 3.2 oz (134.4 kg)      Physical Exam Constitutional:      Comments: Obese.    HENT:     Head: Normocephalic and atraumatic.     Mouth/Throat:     Pharynx: No oropharyngeal exudate.  Eyes:     Pupils: Pupils are equal, round, and reactive to light.  Cardiovascular:     Rate and Rhythm: Normal rate and regular rhythm.  Pulmonary:     Effort: No respiratory distress.     Breath sounds: No wheezing.     Comments:  Clear to auscultation bilaterally. Abdominal:     General: Bowel sounds are normal. There is no distension.     Palpations: Abdomen is soft. There is no mass.  Tenderness: There is no abdominal tenderness. There is no guarding or rebound.  Musculoskeletal:        General: No tenderness. Normal range of motion.     Cervical back: Normal range of motion and neck supple.  Skin:    General: Skin is warm.  Neurological:     Mental Status: She is alert and oriented to person, place, and time.  Psychiatric:        Mood and Affect: Affect normal.    Erythema of the ball of the left big toe.  No discharge noted.  LABORATORY DATA:  I have reviewed the data as listed Lab Results  Component Value Date   WBC 4.0 08/05/2022   HGB 13.9 08/05/2022   HCT 43.0 08/05/2022   MCV 85.3 08/05/2022   PLT 189 08/05/2022   Recent Labs    04/01/22 0936 05/31/22 1057 06/05/22 1030 08/05/22 0947  NA 136  --  133* 134*  K 4.3  --  4.4 4.4  CL 99  --  98 100  CO2 28  --  28 28  GLUCOSE 147*  --  167* 137*  BUN 23  --  21 24*  CREATININE 1.09* 1.10* 1.20* 1.15*  CALCIUM 9.8  --  9.2 9.0  GFRNONAA 53*  --  48* 50*  PROT 7.8  --  7.7 7.8  ALBUMIN 3.7  --  3.6 3.6  AST 21  --  21 20  ALT 14  --  14 14  ALKPHOS 53  --  57 48  BILITOT 0.4  --  0.5 0.4    RADIOGRAPHIC STUDIES: I have personally reviewed the radiological images as listed and agreed with the findings in the report.   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 ON Osimertinib 80 mg once a day [07/31/2020] but currently on Osimetinib 40 mg/day [severe fatigue- since July 2023]; ] APRIL 5th, 2024-Stable size and appearance of right upper lobe mass. No new signs of metastatic disease in the chest, abdomen or pelvis.  No evidence of metastatic disease in the abdomen pelvis;  Stable RIGHT adrenal nodule.  # Continue  osimertinib at 40 mg/day  no overt progression of disease [see above]; clinically- stable.  Will repeat CT Chest/Neck- in 3 month.    # Fatigue: likley sec to osimertinib. Continue  decreased the dose to 40 mg/day. stable.  # CKD stage III monitor closely.  Recommend continued hydration control of blood pressure/diabetes.  Stable.   # INCIDENTAL Left sublingual fullness/ Incidental large thyroid goiter/nodule noted- s/p- tongue mass/thyroid nodule- OCT 2023- CT neck-stable.Monitor for now.   #Chronic joint pains-continue Celebrex every other day [see below]; Tylenol as needed for pain- stable.  #Diabetes /type II -PBF- BG- 137; currently on Munjauro/ GLP-1 agonist.stable.   # DISPOSITION:   # follow up in 3  months MD; labs- cbc/cmp/mag; 1 week PRIOR-CT CAP; neck CT scan -Dr.B    All questions were answered. The patient knows to call the clinic with any problems, questions or concerns.    Earna Coder, MD 08/05/2022 11:14 AM

## 2022-08-05 NOTE — Progress Notes (Signed)
No concerns today 

## 2022-10-04 ENCOUNTER — Ambulatory Visit (INDEPENDENT_AMBULATORY_CARE_PROVIDER_SITE_OTHER): Payer: Medicare Other | Admitting: Internal Medicine

## 2022-10-04 ENCOUNTER — Encounter: Payer: Self-pay | Admitting: Internal Medicine

## 2022-10-04 VITALS — BP 128/70 | HR 69 | Ht 64.0 in | Wt 295.0 lb

## 2022-10-04 DIAGNOSIS — C3411 Malignant neoplasm of upper lobe, right bronchus or lung: Secondary | ICD-10-CM

## 2022-10-04 DIAGNOSIS — R300 Dysuria: Secondary | ICD-10-CM | POA: Diagnosis not present

## 2022-10-04 DIAGNOSIS — Z7984 Long term (current) use of oral hypoglycemic drugs: Secondary | ICD-10-CM

## 2022-10-04 DIAGNOSIS — I10 Essential (primary) hypertension: Secondary | ICD-10-CM

## 2022-10-04 DIAGNOSIS — E118 Type 2 diabetes mellitus with unspecified complications: Secondary | ICD-10-CM | POA: Diagnosis not present

## 2022-10-04 LAB — POCT URINALYSIS DIPSTICK
Bilirubin, UA: NEGATIVE
Blood, UA: NEGATIVE
Glucose, UA: NEGATIVE
Ketones, UA: NEGATIVE
Leukocytes, UA: NEGATIVE
Nitrite, UA: NEGATIVE
Protein, UA: NEGATIVE
Spec Grav, UA: 1.015 (ref 1.010–1.025)
Urobilinogen, UA: 0.2 E.U./dL
pH, UA: 6 (ref 5.0–8.0)

## 2022-10-04 LAB — POCT GLYCOSYLATED HEMOGLOBIN (HGB A1C): Hemoglobin A1C: 6.8 % — AB (ref 4.0–5.6)

## 2022-10-04 MED ORDER — NITROFURANTOIN MONOHYD MACRO 100 MG PO CAPS
100.0000 mg | ORAL_CAPSULE | Freq: Two times a day (BID) | ORAL | 0 refills | Status: AC
Start: 2022-10-04 — End: 2022-10-09

## 2022-10-04 MED ORDER — TIRZEPATIDE 7.5 MG/0.5ML ~~LOC~~ SOAJ
7.5000 mg | SUBCUTANEOUS | 1 refills | Status: DC
Start: 2022-10-04 — End: 2023-02-13

## 2022-10-04 NOTE — Assessment & Plan Note (Signed)
On osimertinib at 40 mg/day with no evidence of disease progression Followed closely by Oncology

## 2022-10-04 NOTE — Progress Notes (Signed)
Date:  10/04/2022   Name:  Shantez Wiker   DOB:  April 12, 1945   MRN:  [id number]   Chief Complaint: Diabetes and Hypertension  Diabetes She presents for her follow-up diabetic visit. She has type 2 diabetes mellitus. Pertinent negatives for hypoglycemia include no headaches or tremors. Pertinent negatives for diabetes include no chest pain, no fatigue, no polydipsia and no polyuria. Current diabetic treatments: glipizide and mounjaro.  Dysuria  This is a recurrent problem. The current episode started in the past 7 days. The problem occurs intermittently. The pain is mild. There has been no fever. Pertinent negatives include no hematuria, hesitancy or nausea.    Lab Results  Component Value Date   NA 134 (L) 08/05/2022   K 4.4 08/05/2022   CO2 28 08/05/2022   GLUCOSE 137 (H) 08/05/2022   BUN 24 (H) 08/05/2022   CREATININE 1.15 (H) 08/05/2022   CALCIUM 9.0 08/05/2022   GFRNONAA 50 (L) 08/05/2022   Lab Results  Component Value Date   CHOL 192 11/26/2021   HDL 75 11/26/2021   LDLCALC 98 11/26/2021   TRIG 107 11/26/2021   CHOLHDL 2.6 11/26/2021   Lab Results  Component Value Date   TSH 1.680 10/06/2019   Lab Results  Component Value Date   HGBA1C 6.8 (A) 10/04/2022   Lab Results  Component Value Date   WBC 4.0 08/05/2022   HGB 13.9 08/05/2022   HCT 43.0 08/05/2022   MCV 85.3 08/05/2022   PLT 189 08/05/2022   Lab Results  Component Value Date   ALT 14 08/05/2022   AST 20 08/05/2022   ALKPHOS 48 08/05/2022   BILITOT 0.4 08/05/2022   Lab Results  Component Value Date   VD25OH 34.9 03/07/2020     Review of Systems  Constitutional:  Negative for appetite change, fatigue, fever and unexpected weight change.  HENT:  Negative for tinnitus and trouble swallowing.   Eyes:  Negative for visual disturbance.  Respiratory:  Negative for cough, chest tightness and shortness of breath.   Cardiovascular:  Negative for chest pain, palpitations and leg swelling.   Gastrointestinal:  Negative for abdominal pain and nausea.  Endocrine: Negative for polydipsia and polyuria.  Genitourinary:  Positive for dysuria. Negative for hematuria and hesitancy.  Musculoskeletal:  Negative for arthralgias.  Neurological:  Negative for tremors, numbness and headaches.  Psychiatric/Behavioral:  Negative for dysphoric mood.     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   Vitamin D deficiency 10/06/2019   Rosacea 04/08/2019   Hyperlipidemia associated with type 2 diabetes mellitus (HCC) 04/08/2019   Vertigo 04/07/2018   Tubular adenoma of colon 03/10/2017   Tinea pedis of left foot 11/22/2016   Type II diabetes mellitus with complication (HCC) 07/06/2015   Essential (primary) hypertension 08/01/2014   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 Tears off skin. blisters   Amoxicillin Rash   Codeine  Other reaction(s): drowsiness   Penicillins Rash    Past Surgical History:  Procedure Laterality Date   BLEPHAROPLASTY Bilateral 12/19/2017   CARPAL TUNNEL RELEASE Bilateral 1985   CATARACT EXTRACTION W/PHACO Right 05/22/2022   Procedure: CATARACT EXTRACTION PHACO AND INTRAOCULAR LENS PLACEMENT (IOC) RIGHT DIABETIC;  Surgeon: Lockie Mola, MD;  Location: [facility];  Service: Ophthalmology;  Laterality: Right;  3.79 0:41.1   CATARACT EXTRACTION W/PHACO Left  06/12/2022   Procedure: CATARACT EXTRACTION PHACO AND INTRAOCULAR LENS PLACEMENT (IOC) LEFT DIABETIC  6.12  00:46.2;  Surgeon: Lockie Mola, MD;  Location: [facility];  Service: Ophthalmology;  Laterality: Left;  Diabetic   CHOLECYSTECTOMY     COLONOSCOPY WITH PROPOFOL N/A 03/07/2017   Procedure: COLONOSCOPY WITH PROPOFOL;  Surgeon: Wyline Mood, MD;  Location: [facility];  Service: Gastroenterology;  Laterality: N/A;   DILATION AND CURETTAGE OF UTERUS  12/2011   benign endometrial polyp   HALLUX VALGUS AUSTIN Right 09/02/2014   Procedure: HALLUX VALGUS AUSTIN;  Surgeon: Gwyneth Revels, DPM;  Location: [facility];  Service: Podiatry;  Laterality: Right;   HERNIA REPAIR     JOINT REPLACEMENT Bilateral    TKR   LAPAROSCOPIC GASTRIC BANDING  2010   Lap Band   LAPAROSCOPIC TOTAL HYSTERECTOMY  12/23/2019   lymph node removed Right    TOTAL KNEE ARTHROPLASTY Bilateral 2003, 2004   TOTAL KNEE ARTHROPLASTY Bilateral    VENTRAL HERNIA REPAIR  2008    Social History   Tobacco Use   Smoking status: Never   Smokeless tobacco: Never  Vaping Use   Vaping status: Never Used  Substance Use Topics   Alcohol use: No    Alcohol/week: 0.0 standard drinks of alcohol   Drug use: No     Medication list has been reviewed and updated.  Current Meds  Medication Sig   acetaminophen (TYLENOL) 500 MG tablet Take 1,000 mg by mouth at bedtime as needed for moderate pain.   celecoxib (CELEBREX) 200 MG capsule TAKE 1 CAPSULE DAILY   cholecalciferol (VITAMIN D3) 25 MCG (1000 UNIT) tablet Take 1,000 Units by mouth daily.   cyclobenzaprine (FLEXERIL) 10 MG tablet Take 1 tablet (10 mg total) by mouth at bedtime.   FREESTYLE LITE test strip TEST BLOOD SUGAR TWICE A DAY   Lancets (FREESTYLE) lancets TEST BLOOD SUGAR TWICE A DAY   nitrofurantoin, macrocrystal-monohydrate, (MACROBID) 100 MG capsule Take 1 capsule (100 mg total) by mouth 2 (two) times daily for 5 days.    olmesartan-hydrochlorothiazide (BENICAR HCT) 40-25 MG tablet Take 1 tablet by mouth daily.   ondansetron (ZOFRAN) 8 MG tablet One pill every 8 hours as needed for nausea/vomitting.   OXYGEN Inhale into the lungs at bedtime.   prochlorperazine (COMPAZINE) 10 MG tablet TAKE 1 TABLET EVERY 6 HOURS AS NEEDED FOR NAUSEA OR VOMITING   TAGRISSO 40 MG tablet TAKE 1 TABLET DAILY.   tirzepatide (MOUNJARO) 7.5 MG/0.5ML Pen Inject 7.5 mg into the skin once a week.   [DISCONTINUED] tirzepatide [facility]) 5 MG/0.5ML Pen Inject 5 mg into the skin once a week.       10/04/2022    8:37 AM 06/04/2022    3:19 PM 01/30/2022   11:16 AM 06/08/2021    9:35 AM  GAD 7 : Generalized Anxiety Score  Nervous, Anxious, on Edge 0 0 0 0  Control/stop worrying 0 0 0 0  Worry too much - different things 0 0 0 0  Trouble relaxing 0 0 0 0  Restless 0  0 0 0  Easily annoyed or irritable 0 0 0 0  Afraid - awful might happen 0 0 0 0  Total GAD 7 Score 0 0 0 0  Anxiety Difficulty Not difficult at all Not difficult at all Not difficult at all Not difficult at all       10/04/2022    8:37 AM 06/04/2022    3:19 PM 01/30/2022   11:16 AM  Depression screen PHQ 2/9  Decreased Interest 0 1 1  Down, Depressed, Hopeless 0 0 0  PHQ - 2 Score 0 1 1  Altered sleeping 0 1 1  Tired, decreased energy 3 3 3   Change in appetite 0 0 3  Feeling bad or failure about yourself  0 0 0  Trouble concentrating 0 0 0  Moving slowly or fidgety/restless 0 0 0  Suicidal thoughts 0 0 0  PHQ-9 Score 3 5 8   Difficult doing work/chores Not difficult at all Not difficult at all Not difficult at all    BP Readings from Last 3 Encounters:  10/04/22 128/70  08/05/22 (!) 159/54  06/12/22 119/72    Physical Exam Vitals and nursing note reviewed.  Constitutional:      General: She is not in acute distress.    Appearance: She is well-developed.  HENT:     Head: Normocephalic and atraumatic.     Right Ear: Tympanic membrane and ear canal normal.      Left Ear: Tympanic membrane and ear canal normal.     Nose:     Right Sinus: No maxillary sinus tenderness.     Left Sinus: No maxillary sinus tenderness.  Eyes:     General: No scleral icterus.       Right eye: No discharge.        Left eye: No discharge.     Conjunctiva/sclera: Conjunctivae normal.  Neck:     Thyroid: No thyromegaly.     Vascular: No carotid bruit.  Cardiovascular:     Rate and Rhythm: Normal rate and regular rhythm.     Pulses: Normal pulses.     Heart sounds: Normal heart sounds.  Pulmonary:     Effort: Pulmonary effort is normal. No respiratory distress.     Breath sounds: No wheezing.  Chest:  Breasts:    Right: No mass, nipple discharge, skin change or tenderness.     Left: No mass, nipple discharge, skin change or tenderness.  Abdominal:     General: Bowel sounds are normal.     Palpations: Abdomen is soft.     Tenderness: There is no abdominal tenderness.  Musculoskeletal:     Cervical back: Normal range of motion. No erythema.     Right lower leg: No edema.     Left lower leg: No edema.  Lymphadenopathy:     Cervical: No cervical adenopathy.  Skin:    General: Skin is warm and dry.     Findings: No rash.  Neurological:     Mental Status: She is alert and oriented to person, place, and time.     Cranial Nerves: No cranial nerve deficit.     Sensory: No sensory deficit.     Deep Tendon Reflexes: Reflexes are normal and symmetric.  Psychiatric:        Attention and Perception: Attention normal.        Mood and Affect: Mood normal.    Diabetic Foot Exam - Simple   Simple Foot Form Diabetic Foot exam was performed with  the following findings: Yes 10/04/2022  8:50 AM  Visual Inspection No deformities, no ulcerations, no other skin breakdown bilaterally: Yes Sensation Testing Intact to touch and monofilament testing bilaterally: Yes Pulse Check Posterior Tibialis and Dorsalis pulse intact bilaterally: Yes Comments Mildly peeling skin of the  plantar aspect of left foot      Wt Readings from Last 3 Encounters:  10/04/22 295 lb (133.8 kg)  08/05/22 296 lb 3.2 oz (134.4 kg)  06/12/22 (!) 301 lb 12.8 oz (136.9 kg)    BP 128/70   Pulse 69   Ht 5\' 4"  (1.626 m)   Wt 295 lb (133.8 kg)   SpO2 97%   BMI 50.64 kg/m   Assessment and Plan:  Problem List Items Addressed This Visit       Unprioritized   Type II diabetes mellitus with complication (HCC) (Chronic)    Blood sugars stable without hypoglycemic symptoms or events. Current regimen is glipizide and Mounjaro. Changes made last visit - resume mounjaro. 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 No concerns or side effects to current medication. No change in regimen; continue low sodium diet.       Other Visit Diagnoses     Long term current use of oral hypoglycemic drug       Dysuria       Relevant Medications   nitrofurantoin, macrocrystal-monohydrate, (MACROBID) 100 MG capsule   Other Relevant Orders   POCT urinalysis dipstick (Completed)       Return in about 4 months (around 02/03/2023) for Medicare annual.    Reubin Milan, MD [facility] [place]

## 2022-10-04 NOTE — Assessment & Plan Note (Signed)
Blood sugars stable without hypoglycemic symptoms or events. Current regimen is glipizide and Mounjaro. Changes made last visit - resume mounjaro. Lab Results  Component Value Date   HGBA1C 7.9 (A) 06/04/2022  A1C today =

## 2022-10-04 NOTE — Assessment & Plan Note (Signed)
Normal exam with stable BP on Benicar hct. No concerns or side effects to current medication. No change in regimen; continue low sodium diet.

## 2022-10-07 ENCOUNTER — Encounter: Payer: Self-pay | Admitting: Internal Medicine

## 2022-10-07 ENCOUNTER — Other Ambulatory Visit: Payer: Self-pay

## 2022-10-07 DIAGNOSIS — I10 Essential (primary) hypertension: Secondary | ICD-10-CM

## 2022-10-07 MED ORDER — OLMESARTAN MEDOXOMIL-HCTZ 40-25 MG PO TABS
1.0000 | ORAL_TABLET | Freq: Every day | ORAL | 0 refills | Status: DC
Start: 2022-10-07 — End: 2023-03-03

## 2022-10-07 MED ORDER — OLMESARTAN MEDOXOMIL-HCTZ 40-25 MG PO TABS
1.0000 | ORAL_TABLET | Freq: Every day | ORAL | 0 refills | Status: DC
Start: 2022-10-07 — End: 2022-10-07

## 2022-11-05 ENCOUNTER — Ambulatory Visit
Admission: RE | Admit: 2022-11-05 | Discharge: 2022-11-05 | Disposition: A | Discharge status: Home / Self Care | Payer: Medicare Other | Source: Ambulatory Visit | Attending: Internal Medicine | Admitting: Internal Medicine

## 2022-11-05 DIAGNOSIS — E0789 Other specified disorders of thyroid: Secondary | ICD-10-CM | POA: Diagnosis not present

## 2022-11-05 DIAGNOSIS — C3490 Malignant neoplasm of unspecified part of unspecified bronchus or lung: Secondary | ICD-10-CM | POA: Diagnosis not present

## 2022-11-05 DIAGNOSIS — J849 Interstitial pulmonary disease, unspecified: Secondary | ICD-10-CM | POA: Diagnosis not present

## 2022-11-05 DIAGNOSIS — I700 Atherosclerosis of aorta: Secondary | ICD-10-CM | POA: Diagnosis not present

## 2022-11-05 DIAGNOSIS — C3411 Malignant neoplasm of upper lobe, right bronchus or lung: Secondary | ICD-10-CM | POA: Insufficient documentation

## 2022-11-05 DIAGNOSIS — E049 Nontoxic goiter, unspecified: Secondary | ICD-10-CM | POA: Diagnosis not present

## 2022-11-05 LAB — POCT I-STAT CREATININE: Creatinine, Ser: 1.4 mg/dL — ABNORMAL HIGH (ref 0.44–1.00)

## 2022-11-05 MED ORDER — IOHEXOL 350 MG/ML SOLN
75.0000 mL | Freq: Once | INTRAVENOUS | Status: AC | PRN
  Administered 2022-11-05: 60 mL via INTRAVENOUS

## 2022-11-12 ENCOUNTER — Ambulatory Visit: Payer: TRICARE For Life (TFL) | Admitting: Internal Medicine

## 2022-11-12 ENCOUNTER — Other Ambulatory Visit: Payer: TRICARE For Life (TFL)

## 2022-11-13 ENCOUNTER — Inpatient Hospital Stay (HOSPITAL_BASED_OUTPATIENT_CLINIC_OR_DEPARTMENT_OTHER): Payer: Medicare Other | Admitting: Internal Medicine

## 2022-11-13 ENCOUNTER — Inpatient Hospital Stay: Payer: Medicare Other | Attending: Internal Medicine

## 2022-11-13 ENCOUNTER — Encounter: Payer: Self-pay | Admitting: Internal Medicine

## 2022-11-13 VITALS — BP 132/59 | HR 80 | Temp 97.6°F | Ht 64.0 in | Wt 289.5 lb

## 2022-11-13 DIAGNOSIS — E1122 Type 2 diabetes mellitus with diabetic chronic kidney disease: Secondary | ICD-10-CM | POA: Insufficient documentation

## 2022-11-13 DIAGNOSIS — Z79899 Other long term (current) drug therapy: Secondary | ICD-10-CM | POA: Insufficient documentation

## 2022-11-13 DIAGNOSIS — I129 Hypertensive chronic kidney disease with stage 1 through stage 4 chronic kidney disease, or unspecified chronic kidney disease: Secondary | ICD-10-CM | POA: Diagnosis not present

## 2022-11-13 DIAGNOSIS — N183 Chronic kidney disease, stage 3 unspecified: Secondary | ICD-10-CM | POA: Diagnosis not present

## 2022-11-13 DIAGNOSIS — C3411 Malignant neoplasm of upper lobe, right bronchus or lung: Secondary | ICD-10-CM | POA: Insufficient documentation

## 2022-11-13 DIAGNOSIS — E0789 Other specified disorders of thyroid: Secondary | ICD-10-CM

## 2022-11-13 LAB — CBC WITH DIFFERENTIAL (CANCER CENTER ONLY)
Abs Immature Granulocytes: 0.02 10*3/uL (ref 0.00–0.07)
Basophils Absolute: 0 10*3/uL (ref 0.0–0.1)
Basophils Relative: 1 %
Eosinophils Absolute: 0.3 10*3/uL (ref 0.0–0.5)
Eosinophils Relative: 5 %
HCT: 43.5 % (ref 36.0–46.0)
Hemoglobin: 14.2 g/dL (ref 12.0–15.0)
Immature Granulocytes: 0 %
Lymphocytes Relative: 14 %
Lymphs Abs: 0.9 10*3/uL (ref 0.7–4.0)
MCH: 27.7 pg (ref 26.0–34.0)
MCHC: 32.6 g/dL (ref 30.0–36.0)
MCV: 85 fL (ref 80.0–100.0)
Monocytes Absolute: 0.5 10*3/uL (ref 0.1–1.0)
Monocytes Relative: 8 %
Neutro Abs: 4.3 10*3/uL (ref 1.7–7.7)
Neutrophils Relative %: 72 %
Platelet Count: 166 10*3/uL (ref 150–400)
RBC: 5.12 MIL/uL — ABNORMAL HIGH (ref 3.87–5.11)
RDW: 13.8 % (ref 11.5–15.5)
WBC Count: 6 10*3/uL (ref 4.0–10.5)
nRBC: 0 % (ref 0.0–0.2)

## 2022-11-13 LAB — CMP (CANCER CENTER ONLY)
ALT: 15 U/L (ref 0–44)
AST: 21 U/L (ref 15–41)
Albumin: 3.7 g/dL (ref 3.5–5.0)
Alkaline Phosphatase: 52 U/L (ref 38–126)
Anion gap: 6 (ref 5–15)
BUN: 20 mg/dL (ref 8–23)
CO2: 28 mmol/L (ref 22–32)
Calcium: 9.1 mg/dL (ref 8.9–10.3)
Chloride: 98 mmol/L (ref 98–111)
Creatinine: 1.27 mg/dL — ABNORMAL HIGH (ref 0.44–1.00)
GFR, Estimated: 44 mL/min — ABNORMAL LOW (ref >60)
Glucose, Bld: 122 mg/dL — ABNORMAL HIGH (ref 70–99)
Potassium: 4 mmol/L (ref 3.5–5.1)
Sodium: 132 mmol/L — ABNORMAL LOW (ref 135–145)
Total Bilirubin: 0.6 mg/dL (ref 0.3–1.2)
Total Protein: 7.6 g/dL (ref 6.5–8.1)

## 2022-11-13 LAB — MAGNESIUM: Magnesium: 1.8 mg/dL (ref 1.7–2.4)

## 2022-11-13 NOTE — Assessment & Plan Note (Addendum)
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 Osimertinib 80 mg once a day [07/31/2020] but currently on Osimetinib 40 mg/day [severe fatigue- since July 2023]; ] SEP 9th, 2024- Stable spiculated right upper lobe nodule;  No evidence of metastatic disease in the chest. Unchanged appearance of chronic interstitial lung disease.  # Continue osimertinib at 40 mg/day  no overt progression of disease [see above]; clinically-  stable. Will continue surveillance scans every 4-5 m; will order at next visit.    # Fatigue: likley sec to osimertinib. Continue  decreased the dose to 40 mg/day. stable.  # CKD stage III monitor closely.  Recommend continued hydration control of blood pressure/diabetes.  Stable.   # INCIDENTAL Left sublingual fullness/ Incidental large thyroid goiter/nodule noted- s/p- tongue mass/thyroid nodule- OCT 2023- CT neck-stable.Monitor for now.- stable.  #Chronic joint pains-continue Celebrex every other day [see below]; Tylenol as needed for pain- stable.  #Diabetes /type II -PBF- BG- 137; currently on Munjauro/ GLP-1 agonist- stable.  # DISPOSITION:   # follow up in 3 months MD; labs- cbc/cmp/mag; -Dr.B   # I reviewed the blood work- with the patient in detail; also reviewed the imaging independently [as summarized above]; and with the patient in detail.

## 2022-11-13 NOTE — Progress Notes (Signed)
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  [Dr.Byrnett & Dr.Fleming]; May 2022-MRI brain-NEG.   # MAY 11th, 2022- carbo-alimta x1 cycle; discontinued; June 6th, 2022-osimertinib 80 mg a day  #Thyroid -enlargement; recommend biopsy per radiology; patient /husband has been reluctant  # Incidental-out of place Gastric band [since 2014] gastric band.  Asymptomatic monitor for now  # NGS/MOLECULAR TESTS:POSITIVE for EGFR mutation deletion 19    # PALLIATIVE CARE EVALUATION:  # PAIN MANAGEMENT:    DIAGNOSIS: Lung cancer  STAGE:   IIIC      ;  GOALS:Palliatve  CURRENT/MOST RECENT THERAPY : Osiemertinib      Primary cancer of right upper lobe of lung (HCC)  06/30/2020 Initial Diagnosis   Primary cancer of right upper lobe of lung (HCC)   06/30/2020 Cancer Staging   Staging form: Lung, AJCC 8th Edition - Clinical: Stage IIIC (cT4, cN3, cM0) - Signed by Earna Coder, MD on 06/30/2020 Histopathologic type: Adenocarcinoma, NOS   07/05/2020 - 07/05/2020 Chemotherapy   Patient is on Treatment Plan : LUNG CARBOplatin / Pemetrexed / Pembrolizumab q21d Induction x 4 cycles / Maintenance Pemetrexed + Pembrolizumab      HISTORY OF PRESENTING ILLNESS:  She is accompanied by husband.  Ambulating independently.   Megan Shields 77 y.o.  female  Stage IV/adenocarcinoma the lung EGFR mutated-currently on osimertinib is here for  follow-up/ and review  CT neck/chest 11/05/22 .Marland Kitchen  Pt has have chronic mild dyspnea- not any worse.  Otherwise denies any worsening cough.  Admits to compliance with her Tagrisso.    Denies any chest pain. Mild chronic Swelling in the legs. No nausea no vomiting .  No diarrhea.  Chronic joint pains.   Review of Systems  Constitutional:  Positive for malaise/fatigue. Negative for chills, diaphoresis, fever and weight loss.  HENT:  Negative for nosebleeds and sore throat.   Eyes:  Negative for double vision.  Respiratory:  Negative for hemoptysis, sputum production and wheezing.   Cardiovascular:  Negative for chest pain, palpitations, orthopnea and leg swelling.  Gastrointestinal:  Negative for abdominal pain, blood in stool, constipation, diarrhea, heartburn, melena, nausea and vomiting.  Genitourinary:  Negative for dysuria, frequency and urgency.  Musculoskeletal:  Negative for back pain and joint pain.  Neurological:  Negative for dizziness, tingling, focal weakness, weakness and headaches.  Endo/Heme/Allergies:  Does not bruise/bleed easily.  Psychiatric/Behavioral:  Negative for depression. The patient is not nervous/anxious and does not have insomnia.      MEDICAL HISTORY:  Past Medical History:  Diagnosis Date   Anemia    vitamin d deficiency   Arthritis    Chronic cough    Depression    Diabetes mellitus without complication (HCC)    Dyspnea    Hypertension    ILD (interstitial lung disease) (HCC)    Obesity    BMI 49.44   Pneumonia    Pneumonia due to 2019 novel coronavirus 2022   community acquired also. covid positive May 2022   PONV (postoperative nausea and vomiting)    Primary cancer  of right upper lobe of lung (HCC) 06/30/2020   Rosacea    Shoulder contusion 11/22/2015   Vertigo    none since 3/23    SURGICAL HISTORY: Past Surgical History:  Procedure Laterality Date   BLEPHAROPLASTY Bilateral 12/19/2017   CARPAL TUNNEL RELEASE Bilateral 1985   CATARACT  EXTRACTION W/PHACO Right 05/22/2022   Procedure: CATARACT EXTRACTION PHACO AND INTRAOCULAR LENS PLACEMENT (IOC) RIGHT DIABETIC;  Surgeon: Lockie Mola, MD;  Location: [facility];  Service: Ophthalmology;  Laterality: Right;  3.79 0:41.1   CATARACT EXTRACTION W/PHACO Left 06/12/2022   Procedure: CATARACT EXTRACTION PHACO AND INTRAOCULAR LENS PLACEMENT (IOC) LEFT DIABETIC  6.12  00:46.2;  Surgeon: Lockie Mola, MD;  Location: [facility];  Service: Ophthalmology;  Laterality: Left;  Diabetic   CHOLECYSTECTOMY     COLONOSCOPY WITH PROPOFOL N/A 03/07/2017   Procedure: COLONOSCOPY WITH PROPOFOL;  Surgeon: Wyline Mood, MD;  Location: [facility];  Service: Gastroenterology;  Laterality: N/A;   DILATION AND CURETTAGE OF UTERUS  12/2011   benign endometrial polyp   HALLUX VALGUS AUSTIN Right 09/02/2014   Procedure: HALLUX VALGUS AUSTIN;  Surgeon: Gwyneth Revels, DPM;  Location: [facility];  Service: Podiatry;  Laterality: Right;   HERNIA REPAIR     JOINT REPLACEMENT Bilateral    TKR   LAPAROSCOPIC GASTRIC BANDING  2010   Lap Band   LAPAROSCOPIC TOTAL HYSTERECTOMY  12/23/2019   lymph node removed Right    TOTAL KNEE ARTHROPLASTY Bilateral 2003, 2004   TOTAL KNEE ARTHROPLASTY Bilateral    VENTRAL HERNIA REPAIR  2008    SOCIAL HISTORY: Social History   Socioeconomic History   Marital status: Married    Spouse name: Peyton Najjar   Number of children: Not on file   Years of education: Not on file   Highest education level: Not on file  Occupational History   Occupation: Therapist, music    Comment: RETIRED  Tobacco Use   Smoking status: Never   Smokeless tobacco: Never  Vaping Use   Vaping status: Never Used  Substance and Sexual Activity   Alcohol use: No    Alcohol/week: 0.0 standard drinks of alcohol   Drug use: No   Sexual activity: Not Currently  Other Topics Concern   Not on file  Social History Narrative   Never smoked; no alcohol. Lives in [place] with  husband . Home maker.    Daughter lives next door. 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   Social Determinants of Health   Financial Resource Strain: Low Risk  (06/04/2022)   Overall Financial Resource Strain (CARDIA)    Difficulty of Paying Living Expenses: Not hard at all  Food Insecurity: No Food Insecurity (06/04/2022)   Hunger Vital Sign    Worried About Running Out of Food in the Last Year: Never true    Ran Out of Food in the Last Year: Never true  Transportation Needs: No Transportation Needs (06/04/2022)   PRAPARE - Administrator, Civil Service (Medical): No    Lack of Transportation (Non-Medical): No  Physical Activity: Inactive (06/04/2022)   Exercise Vital Sign    Days of Exercise per Week: 0 days    Minutes of Exercise per Session: 0 min  Stress: No Stress Concern Present (06/04/2022)   [employer] of Occupational Health - Occupational Stress Questionnaire    Feeling of Stress : Not at all  Social Connections: Moderately Isolated (11/22/2019)   Social Connection and Isolation Panel [NHANES]  Frequency of Communication with Friends and Family: More than three times a week    Frequency of Social Gatherings with Friends and Family: More than three times a week    Attends Religious Services: Never    Database administrator or Organizations: No    Attends Banker Meetings: Never    Marital Status: Married  Catering manager Violence: Not At Risk (06/04/2022)   Humiliation, Afraid, Rape, and Kick questionnaire    Fear of Current or Ex-Partner: No    Emotionally Abused: No    Physically Abused: No    Sexually Abused: No    FAMILY HISTORY: Family History  Problem Relation Age of Onset   Breast cancer Mother 66   Breast cancer Paternal Aunt        3 pat aunts   Liver disease Brother        died 56    ALLERGIES:  is allergic to pneumococcal vaccines, wound dressing adhesive, amoxicillin, codeine, and penicillins.  MEDICATIONS:  Current  Outpatient Medications  Medication Sig Dispense Refill   acetaminophen (TYLENOL) 500 MG tablet Take 1,000 mg by mouth at bedtime as needed for moderate pain.     celecoxib (CELEBREX) 200 MG capsule TAKE 1 CAPSULE DAILY 90 capsule 3   cholecalciferol (VITAMIN D3) 25 MCG (1000 UNIT) tablet Take 1,000 Units by mouth daily.     cyclobenzaprine (FLEXERIL) 10 MG tablet Take 1 tablet (10 mg total) by mouth at bedtime. 30 tablet 1   FREESTYLE LITE test strip TEST BLOOD SUGAR TWICE A DAY 100 strip 6   Lancets (FREESTYLE) lancets TEST BLOOD SUGAR TWICE A DAY 100 each 6   olmesartan-hydrochlorothiazide (BENICAR HCT) 40-25 MG tablet Take 1 tablet by mouth daily. 90 tablet 0   ondansetron (ZOFRAN) 8 MG tablet One pill every 8 hours as needed for nausea/vomitting. 40 tablet 1   OXYGEN Inhale into the lungs at bedtime.     prochlorperazine (COMPAZINE) 10 MG tablet TAKE 1 TABLET EVERY 6 HOURS AS NEEDED FOR NAUSEA OR VOMITING 40 tablet 35   TAGRISSO 40 MG tablet TAKE 1 TABLET DAILY. 30 tablet 3   tirzepatide (MOUNJARO) 7.5 MG/0.5ML Pen Inject 7.5 mg into the skin once a week. 6 mL 1   No current facility-administered medications for this visit.    Mild to moderate erythema of the left big toe/nail bed.  Nail intact  PHYSICAL EXAMINATION: ECOG PERFORMANCE STATUS: 0 - Asymptomatic  Vitals:   11/13/22 0913  BP: (!) 132/59  Pulse: 80  Temp: 97.6 F (36.4 C)  SpO2: 96%    Filed Weights   11/13/22 0913  Weight: 289 lb 8 oz (131.3 kg)      Physical Exam Constitutional:      Comments: Obese.    HENT:     Head: Normocephalic and atraumatic.     Mouth/Throat:     Pharynx: No oropharyngeal exudate.  Eyes:     Pupils: Pupils are equal, round, and reactive to light.  Cardiovascular:     Rate and Rhythm: Normal rate and regular rhythm.  Pulmonary:     Effort: No respiratory distress.     Breath sounds: No wheezing.     Comments: Clear to auscultation bilaterally. Abdominal:     General: Bowel  sounds are normal. There is no distension.     Palpations: Abdomen is soft. There is no mass.     Tenderness: There is no abdominal tenderness. There is no guarding or rebound.  Musculoskeletal:  General: No tenderness. Normal range of motion.     Cervical back: Normal range of motion and neck supple.  Skin:    General: Skin is warm.  Neurological:     Mental Status: She is alert and oriented to person, place, and time.  Psychiatric:        Mood and Affect: Affect normal.    Erythema of the ball of the left big toe.  No discharge noted.  LABORATORY DATA:  I have reviewed the data as listed Lab Results  Component Value Date   WBC 6.0 11/13/2022   HGB 14.2 11/13/2022   HCT 43.5 11/13/2022   MCV 85.0 11/13/2022   PLT 166 11/13/2022   Recent Labs    06/05/22 1030 08/05/22 0947 11/05/22 1023 11/13/22 0915  NA 133* 134*  --  132*  K 4.4 4.4  --  4.0  CL 98 100  --  98  CO2 28 28  --  28  GLUCOSE 167* 137*  --  122*  BUN 21 24*  --  20  CREATININE 1.20* 1.15* 1.40* 1.27*  CALCIUM 9.2 9.0  --  9.1  GFRNONAA 48* 50*  --  44*  PROT 7.7 7.8  --  7.6  ALBUMIN 3.6 3.6  --  3.7  AST 21 20  --  21  ALT 14 14  --  15  ALKPHOS 57 48  --  52  BILITOT 0.5 0.4  --  0.6    RADIOGRAPHIC STUDIES: I have personally reviewed the radiological images as listed and agreed with the findings in the report.   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 Osimertinib 80 mg once a day [07/31/2020] but currently on Osimetinib 40 mg/day [severe fatigue- since July 2023]; ] SEP 9th, 2024- Stable spiculated right upper lobe nodule;  No evidence of metastatic disease in the chest. Unchanged appearance of chronic interstitial lung disease.  # Continue osimertinib at 40 mg/day  no overt progression of disease [see above]; clinically-  stable. Will continue surveillance scans every 4-5  m; will order at next visit.    # Fatigue: likley sec to osimertinib. Continue  decreased the dose to 40 mg/day. stable.  # CKD stage III monitor closely.  Recommend continued hydration control of blood pressure/diabetes.  Stable.   # INCIDENTAL Left sublingual fullness/ Incidental large thyroid goiter/nodule noted- s/p- tongue mass/thyroid nodule- OCT 2023- CT neck-stable.Monitor for now.- stable.  #Chronic joint pains-continue Celebrex every other day [see below]; Tylenol as needed for pain- stable.  #Diabetes /type II -PBF- BG- 137; currently on Munjauro/ GLP-1 agonist- stable.  # DISPOSITION:   # follow up in 3 months MD; labs- cbc/cmp/mag; -Dr.B   # I reviewed the blood work- with the patient in detail; also reviewed the imaging independently [as summarized above]; and with the patient in detail.     All questions were answered. The patient knows to call the clinic with any problems, questions or concerns.    Earna Coder, MD 11/13/2022 10:34 AM

## 2022-11-13 NOTE — Progress Notes (Signed)
Had CT neck/chest 11/05/22.

## 2022-12-03 ENCOUNTER — Other Ambulatory Visit: Payer: Self-pay | Admitting: Internal Medicine

## 2022-12-03 DIAGNOSIS — C3411 Malignant neoplasm of upper lobe, right bronchus or lung: Secondary | ICD-10-CM

## 2022-12-03 NOTE — Telephone Encounter (Signed)
CBC with Differential (Cancer Center Only) Order: [id number] Status: Final result     Visible to patient: Yes (seen)     Next appt: 02/12/2023 at 10:00 AM in Oncology ([facility])     Dx: Thyroid mass of unclear etiology; Pri...   0 Result Notes          Component Ref Range & Units 2 wk ago (11/13/22) 4 mo ago (08/05/22) 6 mo ago (06/05/22) 8 mo ago (04/01/22) 10 mo ago (01/28/22) 1 yr ago (11/22/21) 1 yr ago (10/11/21)  WBC Count 4.0 - 10.5 K/uL 6.0 4.0 5.1 4.2 5.8 4.6 4.4  RBC 3.87 - 5.11 MIL/uL 5.12 High  5.04 5.20 High  5.38 High  5.08 4.82 4.70  Hemoglobin 12.0 - 15.0 g/dL 28.4 13.2 44.0 10.2 72.5 13.2 12.9  HCT 36.0 - 46.0 % 43.5 43.0 44.5 45.7 43.2 41.3 41.0  MCV 80.0 - 100.0 fL 85.0 85.3 85.6 84.9 85.0 85.7 87.2  MCH 26.0 - 34.0 pg 27.7 27.6 27.1 27.5 27.4 27.4 27.4  MCHC 30.0 - 36.0 g/dL 36.6 44.0 34.7 42.5 95.6 32.0 31.5  RDW 11.5 - 15.5 % 13.8 14.1 14.1 13.9 13.8 13.9 14.0  Platelet Count 150 - 400 K/uL 166 189 181 173 191 178 170  nRBC 0.0 - 0.2 % 0.0 0.0 0.0 0.0 0.0 0.0 0.0  Neutrophils Relative % % 72 63 66 60 61 63 62  Neutro Abs 1.7 - 7.7 K/uL 4.3 2.5 3.3 2.5 3.6 2.9 2.8  Lymphocytes Relative % 14 21 17 21 22 20 20   Lymphs Abs 0.7 - 4.0 K/uL 0.9 0.9 0.9 0.9 1.3 0.9 0.9  Monocytes Relative % 8 11 11 13 11 12 12   Monocytes Absolute 0.1 - 1.0 K/uL 0.5 0.4 0.6 0.6 0.7 0.5 0.5  Eosinophils Relative % 5 5 6 5 5 5 4   Eosinophils Absolute 0.0 - 0.5 K/uL 0.3 0.2 0.3 0.2 0.3 0.2 0.2  Basophils Relative % 1 0 0 1 1 0 1  Basophils Absolute 0.0 - 0.1 K/uL 0.0 0.0 0.0 0.0 0.0 0.0 0.0  Immature Granulocytes % 0 0 0 0 0 0 1  Abs Immature Granulocytes 0.00 - 0.07 K/uL 0.02 0.01 CM 0.01 CM 0.01 CM 0.01 CM 0.02 CM 0.03 CM  Comment: Performed at [facility], [address] [place], [place] [address]  Resulting Agency [facility]         Specimen Collected: 11/13/22 09:15 Last  Resulted: 11/13/22 09:27      Lab Flowsheet      Order Details      View Encounter      Lab and Collection Details      Routing      Result History    View All Conversations on this Encounter      CM=Additional comments      Result Care Coordination   Patient Communication   Add Comments   Seen Back to Top    Other Results from 11/13/2022  Magnesium Order: [id number] Status: Final result      Visible to patient: Yes (seen)      Next appt: 02/12/2023 at 10:00 AM in Oncology ([facility])      Dx: Thyroid mass of unclear etiology; Pri...    0 Result Notes            Component Ref Range & Units 2 wk ago (  11/13/22) 4 mo ago (08/05/22) 6 mo ago (06/05/22) 8 mo ago (04/01/22) 10 mo ago (01/28/22) 1 yr ago (11/22/21) 1 yr ago (10/11/21)  Magnesium 1.7 - 2.4 mg/dL 1.8 1.8 CM 1.8 CM 1.7 CM 1.7 CM 1.7 CM 1.8 CM  Comment: Performed at [facility], [address] [place], [place] [address]  Resulting Agency [facility]         Specimen Collected: 11/13/22 09:15 Last Resulted: 11/13/22 09:33      Lab Flowsheet       Order Details       View Encounter       Lab and Collection Details       Routing       Result History     View All Conversations on this Encounter      CM=Additional comments      Result Care Coordination   Patient Communication   Add Comments   Seen Back to Top       Contains abnormal data CMP (Cancer Center only) Order: [id number] Status: Final result      Visible to patient: Yes (seen)      Next appt: 02/12/2023 at 10:00 AM in Oncology ([facility])      Dx: Thyroid mass of unclear etiology; Pri...    0 Result Notes      1 HM Topic            Component Ref Range & Units 2 wk ago (11/13/22) 4 wk ago (11/05/22) 4 mo ago (08/05/22) 6 mo ago (06/05/22) 6 mo ago (05/31/22) 8 mo ago (04/01/22) 10 mo ago (01/28/22)  Sodium 135 - 145 mmol/L  132 Low   134 Low  133 Low   136 137  Potassium 3.5 - 5.1 mmol/L 4.0  4.4 4.4  4.3 4.3  Chloride 98 - 111 mmol/L 98  100 98  99 98  CO2 22 - 32 mmol/L 28  28 28  28 30   Glucose, Bld 70 - 99 mg/dL 696 High   295 High  CM 167 High  CM  147 High  CM 135 High  CM  Comment: Glucose reference range applies only to samples taken after fasting for at least 8 hours.  BUN 8 - 23 mg/dL 20  24 High  21  23 19   Creatinine 0.44 - 1.00 mg/dL 2.84 High  1.32 High  4.40 High  1.20 High  1.10 High  1.09 High  1.00  Calcium 8.9 - 10.3 mg/dL 9.1  9.0 9.2  9.8 9.4  Total Protein 6.5 - 8.1 g/dL 7.6  7.8 7.7  7.8 7.9  Albumin 3.5 - 5.0 g/dL 3.7  3.6 3.6  3.7 3.6  AST 15 - 41 U/L 21  20 21  21 21   ALT 0 - 44 U/L 15  14 14  14 14   Alkaline Phosphatase 38 - 126 U/L 52  48 57  53 61  Total Bilirubin 0.3 - 1.2 mg/dL 0.6  0.4 0.5  0.4 0.5  GFR, Estimated >60 mL/min 44 Low   50 Low  CM      Comment: (NOTE) Calculated using the CKD-EPI Creatinine Equation (2021)  Anion gap 5 - 15 6  6  CM 7 CM  9 CM 9 CM  Comment: Performed at [facility], [address], [place], [place] [address]  Resulting Agency [facility]  [facility]         Specimen Collected: 11/13/22 09:15 Last Resulted: 11/13/22 09:36      Lab Flowsheet       Order Details       View Encounter       Lab and Collection Details       Routing       Result History     View All Conversations on this Encounter      CM=Additional comments      Result Care Coordination   Patient Communication   Add Comments   Seen Back to Top     Satisfied Health Maintenance Topics   Back to Top Diabetic kidney evaluation - eGFR measurement (Yearly)  Next due on 11/13/2023       Result Read / Acknowledged  Acknowledge result User Time Read / Bridgett Larsson, MD 11/13/2022 10:34 AM

## 2022-12-10 IMAGING — CT NM PET TUM IMG INITIAL (PI) SKULL BASE T - THIGH
1 of 9 series · 1 of 25 positions shown · non-contrast
Comparison: CT chest dated 05/23/2020

CLINICAL DATA: Initial treatment strategy for right upper lobe
mass.

EXAM:
NUCLEAR MEDICINE PET SKULL BASE TO THIGH
TECHNIQUE: 14.9 mCi F-18 FDG was injected intravenously. Full-ring PET imaging
was performed from the skull base to thigh after the radiotracer. CT
data was obtained and used for attenuation correction and anatomic
localization.
Fasting blood glucose: 135 mg/dl

[Series 3: ct wb 5.0 b30f · axial · 5.0mm · 0.98mm/px · 1 of 329 slices shown]
[im 329/329  brain]
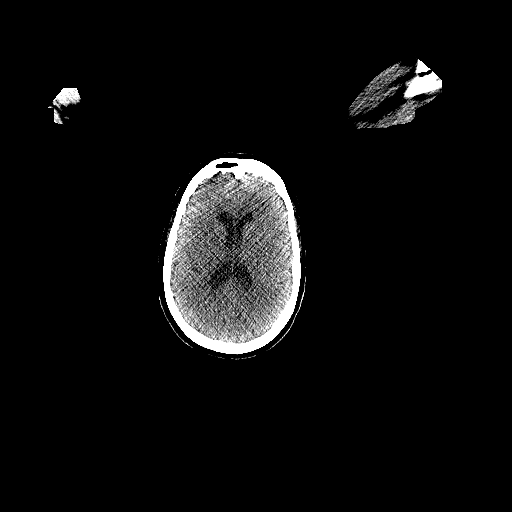

[1 of 25 positions shown; findings below may reference images not displayed]

FINDINGS: Mediastinal blood pool activity: SUV max

Liver activity: SUV max NA

NECK: 9 mm short axis right posterior cervical node (series 3/image
52), max SUV 4.0. Clustered left supraclavicular nodes measuring up
to 9 mm short axis (series 3/image 62), max SUV 8.4.

Incidental CT findings: Thyromegaly, without focal hypermetabolism.

CHEST: [DATE] x 3.5 cm right upper lobe mass (series 3/image 83), max
SUV 8.4, suspicious for primary bronchogenic neoplasm.

Innumerable subcentimeter pulmonary nodules throughout all lobes,
suspicious for metastases.

Thoracic lymphadenopathy, including:

--15 mm short axis low right paratracheal node (series 3/image 89),
max SUV

--16 mm short axis subcarinal node (series 3/image 100), max SUV

Incidental CT findings: Mild atherosclerotic calcifications of the
aortic arch.

ABDOMEN/PELVIS: No abnormal hypermetabolism in the liver, spleen,
pancreas, or adrenal glands.

14 mm right adrenal nodule (series 3/image 137), non FDG avid and
unchanged from remote priors, compatible with a benign adrenal
adenoma.

No hypermetabolic abdominopelvic lymphadenopathy.

Incidental CT findings: Postsurgical changes related to prior
laparoscopic gastric band. Mild hepatic steatosis. Status post
cholecystectomy. Atherosclerotic calcifications of the abdominal
aorta and branch vessels. Status post cholecystectomy.

SKELETON: No focal hypermetabolic activity to suggest skeletal
metastasis.

Incidental CT findings: Degenerative changes of the visualized
thoracolumbar spine.
IMPRESSION: 3.5 cm right upper lobe mass, compatible with primary bronchogenic
neoplasm.

Innumerable pulmonary metastases.

Lower cervical, supraclavicular, and thoracic nodal metastases, as
above.

No evidence of metastatic disease in the abdomen/pelvis. Benign
right adrenal adenoma.

## 2023-01-10 DIAGNOSIS — H18513 Endothelial corneal dystrophy, bilateral: Secondary | ICD-10-CM | POA: Diagnosis not present

## 2023-01-10 DIAGNOSIS — E113293 Type 2 diabetes mellitus with mild nonproliferative diabetic retinopathy without macular edema, bilateral: Secondary | ICD-10-CM | POA: Diagnosis not present

## 2023-01-10 DIAGNOSIS — Z961 Presence of intraocular lens: Secondary | ICD-10-CM | POA: Diagnosis not present

## 2023-01-10 LAB — HM DIABETES EYE EXAM

## 2023-02-12 ENCOUNTER — Inpatient Hospital Stay: Payer: Medicare Other | Attending: Internal Medicine

## 2023-02-12 ENCOUNTER — Encounter: Payer: Self-pay | Admitting: Internal Medicine

## 2023-02-12 ENCOUNTER — Inpatient Hospital Stay (HOSPITAL_BASED_OUTPATIENT_CLINIC_OR_DEPARTMENT_OTHER): Payer: Medicare Other | Admitting: Internal Medicine

## 2023-02-12 VITALS — BP 133/70 | HR 88 | Temp 97.7°F | Ht 64.0 in | Wt 285.2 lb

## 2023-02-12 DIAGNOSIS — Z79899 Other long term (current) drug therapy: Secondary | ICD-10-CM | POA: Insufficient documentation

## 2023-02-12 DIAGNOSIS — C3411 Malignant neoplasm of upper lobe, right bronchus or lung: Secondary | ICD-10-CM | POA: Insufficient documentation

## 2023-02-12 LAB — CBC WITH DIFFERENTIAL (CANCER CENTER ONLY)
Abs Immature Granulocytes: 0.02 10*3/uL (ref 0.00–0.07)
Basophils Absolute: 0 10*3/uL (ref 0.0–0.1)
Basophils Relative: 0 %
Eosinophils Absolute: 0.2 10*3/uL (ref 0.0–0.5)
Eosinophils Relative: 4 %
HCT: 40.7 % (ref 36.0–46.0)
Hemoglobin: 13.1 g/dL (ref 12.0–15.0)
Immature Granulocytes: 0 %
Lymphocytes Relative: 18 %
Lymphs Abs: 0.9 10*3/uL (ref 0.7–4.0)
MCH: 27.7 pg (ref 26.0–34.0)
MCHC: 32.2 g/dL (ref 30.0–36.0)
MCV: 86 fL (ref 80.0–100.0)
Monocytes Absolute: 0.5 10*3/uL (ref 0.1–1.0)
Monocytes Relative: 11 %
Neutro Abs: 3.4 10*3/uL (ref 1.7–7.7)
Neutrophils Relative %: 67 %
Platelet Count: 194 10*3/uL (ref 150–400)
RBC: 4.73 MIL/uL (ref 3.87–5.11)
RDW: 14.5 % (ref 11.5–15.5)
WBC Count: 5.2 10*3/uL (ref 4.0–10.5)
nRBC: 0 % (ref 0.0–0.2)

## 2023-02-12 LAB — CMP (CANCER CENTER ONLY)
ALT: 13 U/L (ref 0–44)
AST: 19 U/L (ref 15–41)
Albumin: 3.3 g/dL — ABNORMAL LOW (ref 3.5–5.0)
Alkaline Phosphatase: 52 U/L (ref 38–126)
Anion gap: 9 (ref 5–15)
BUN: 21 mg/dL (ref 8–23)
CO2: 28 mmol/L (ref 22–32)
Calcium: 9.2 mg/dL (ref 8.9–10.3)
Chloride: 100 mmol/L (ref 98–111)
Creatinine: 1.14 mg/dL — ABNORMAL HIGH (ref 0.44–1.00)
GFR, Estimated: 50 mL/min — ABNORMAL LOW (ref >60)
Glucose, Bld: 128 mg/dL — ABNORMAL HIGH (ref 70–99)
Potassium: 4.1 mmol/L (ref 3.5–5.1)
Sodium: 137 mmol/L (ref 135–145)
Total Bilirubin: 0.5 mg/dL (ref <1.2)
Total Protein: 7.4 g/dL (ref 6.5–8.1)

## 2023-02-12 LAB — MAGNESIUM: Magnesium: 1.6 mg/dL — ABNORMAL LOW (ref 1.7–2.4)

## 2023-02-12 MED ORDER — PROCHLORPERAZINE MALEATE 10 MG PO TABS: 10.0000 mg | ORAL_TABLET | Freq: Four times a day (QID) | ORAL | 3 refills | Status: DC | PRN

## 2023-02-12 NOTE — Progress Notes (Signed)
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  [Dr.Byrnett & Dr.Fleming]; May 2022-MRI brain-NEG.   # MAY 11th, 2022- carbo-alimta x1 cycle; discontinued; June 6th, 2022-osimertinib 80 mg a day  #Thyroid -enlargement; recommend biopsy per radiology; patient /husband has been reluctant  # Incidental-out of place Gastric band [since 2014] gastric band.  Asymptomatic monitor for now  # NGS/MOLECULAR TESTS:POSITIVE for EGFR mutation deletion 19    # PALLIATIVE CARE EVALUATION:  # PAIN MANAGEMENT:    DIAGNOSIS: Lung cancer  STAGE:   IIIC      ;  GOALS:Palliatve  CURRENT/MOST RECENT THERAPY : Osiemertinib      Primary cancer of right upper lobe of lung (HCC)  06/30/2020 Initial Diagnosis   Primary cancer of right upper lobe of lung (HCC)   06/30/2020 Cancer Staging   Staging form: Lung, AJCC 8th Edition - Clinical: Stage IIIC (cT4, cN3, cM0) - Signed by Earna Coder, MD on 06/30/2020 Histopathologic type: Adenocarcinoma, NOS   07/05/2020 - 07/05/2020 Chemotherapy   Patient is on Treatment Plan : LUNG CARBOplatin / Pemetrexed / Pembrolizumab q21d Induction x 4 cycles / Maintenance Pemetrexed + Pembrolizumab      HISTORY OF PRESENTING ILLNESS:  She is accompanied by husband.  Ambulating independently.   Megan Shields 77 y.o.  female  Stage IV/adenocarcinoma the lung EGFR mutated-currently on osimertinib is here for  follow-up.   Pt has have chronic mild dyspnea- not any worse.  Otherwise denies any worsening cough.  Admits to compliance with her Tagrisso.    Denies any chest pain. Mild chronic Swelling in the legs. No nausea no vomiting .  No diarrhea.  Chronic joint pains.   Review of Systems  Constitutional:  Positive for malaise/fatigue. Negative for chills, diaphoresis, fever and weight loss.  HENT:  Negative for nosebleeds and sore throat.   Eyes:  Negative for double vision.  Respiratory:  Negative for hemoptysis, sputum production and wheezing.   Cardiovascular:  Negative for chest pain, palpitations, orthopnea and leg swelling.  Gastrointestinal:  Negative for abdominal pain, blood in stool, constipation, diarrhea, heartburn, melena, nausea and vomiting.  Genitourinary:  Negative for dysuria, frequency and urgency.  Musculoskeletal:  Negative for back pain and joint pain.  Neurological:  Negative for dizziness, tingling, focal weakness, weakness and headaches.  Endo/Heme/Allergies:  Does not bruise/bleed easily.  Psychiatric/Behavioral:  Negative for depression. The patient is not nervous/anxious and does not have insomnia.      MEDICAL HISTORY:  Past Medical History:  Diagnosis Date   Anemia    vitamin d deficiency   Arthritis    Chronic cough    Depression    Diabetes mellitus without complication (HCC)    Dyspnea    Hypertension    ILD (interstitial lung disease) (HCC)    Obesity    BMI 49.44   Pneumonia    Pneumonia due to 2019 novel coronavirus 2022   community acquired also. covid positive May 2022   PONV (postoperative nausea and vomiting)    Primary cancer of right upper lobe of lung (  HCC) 06/30/2020   Rosacea    Shoulder contusion 11/22/2015   Vertigo    none since 3/23    SURGICAL HISTORY: Past Surgical History:  Procedure Laterality Date   BLEPHAROPLASTY Bilateral 12/19/2017   CARPAL TUNNEL RELEASE Bilateral 1985   CATARACT EXTRACTION W/PHACO Right 05/22/2022    Procedure: CATARACT EXTRACTION PHACO AND INTRAOCULAR LENS PLACEMENT (IOC) RIGHT DIABETIC;  Surgeon: Lockie Mola, MD;  Location: [facility];  Service: Ophthalmology;  Laterality: Right;  3.79 0:41.1   CATARACT EXTRACTION W/PHACO Left 06/12/2022   Procedure: CATARACT EXTRACTION PHACO AND INTRAOCULAR LENS PLACEMENT (IOC) LEFT DIABETIC  6.12  00:46.2;  Surgeon: Lockie Mola, MD;  Location: [facility];  Service: Ophthalmology;  Laterality: Left;  Diabetic   CHOLECYSTECTOMY     COLONOSCOPY WITH PROPOFOL N/A 03/07/2017   Procedure: COLONOSCOPY WITH PROPOFOL;  Surgeon: Wyline Mood, MD;  Location: [facility];  Service: Gastroenterology;  Laterality: N/A;   DILATION AND CURETTAGE OF UTERUS  12/2011   benign endometrial polyp   HALLUX VALGUS AUSTIN Right 09/02/2014   Procedure: HALLUX VALGUS AUSTIN;  Surgeon: Gwyneth Revels, DPM;  Location: [facility];  Service: Podiatry;  Laterality: Right;   HERNIA REPAIR     JOINT REPLACEMENT Bilateral    TKR   LAPAROSCOPIC GASTRIC BANDING  2010   Lap Band   LAPAROSCOPIC TOTAL HYSTERECTOMY  12/23/2019   lymph node removed Right    TOTAL KNEE ARTHROPLASTY Bilateral 2003, 2004   TOTAL KNEE ARTHROPLASTY Bilateral    VENTRAL HERNIA REPAIR  2008    SOCIAL HISTORY: Social History   Socioeconomic History   Marital status: Married    Spouse name: Peyton Najjar   Number of children: Not on file   Years of education: Not on file   Highest education level: Not on file  Occupational History   Occupation: Therapist, music    Comment: RETIRED  Tobacco Use   Smoking status: Never   Smokeless tobacco: Never  Vaping Use   Vaping status: Never Used  Substance and Sexual Activity   Alcohol use: No    Alcohol/week: 0.0 standard drinks of alcohol   Drug use: No   Sexual activity: Not Currently  Other Topics Concern   Not on file  Social History Narrative   Never smoked; no alcohol. Lives in [place] with husband . Home maker.    Daughter  lives next door. 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   Social Drivers of Health   Financial Resource Strain: Low Risk  (06/04/2022)   Overall Financial Resource Strain (CARDIA)    Difficulty of Paying Living Expenses: Not hard at all  Food Insecurity: No Food Insecurity (06/04/2022)   Hunger Vital Sign    Worried About Running Out of Food in the Last Year: Never true    Ran Out of Food in the Last Year: Never true  Transportation Needs: No Transportation Needs (06/04/2022)   PRAPARE - Administrator, Civil Service (Medical): No    Lack of Transportation (Non-Medical): No  Physical Activity: Inactive (06/04/2022)   Exercise Vital Sign    Days of Exercise per Week: 0 days    Minutes of Exercise per Session: 0 min  Stress: No Stress Concern Present (06/04/2022)   [employer] of Occupational Health - Occupational Stress Questionnaire    Feeling of Stress : Not at all  Social Connections: Moderately Isolated (11/22/2019)   Social Connection and Isolation Panel [NHANES]    Frequency of Communication with Friends and  Family: More than three times a week    Frequency of Social Gatherings with Friends and Family: More than three times a week    Attends Religious Services: Never    Database administrator or Organizations: No    Attends Banker Meetings: Never    Marital Status: Married  Catering manager Violence: Not At Risk (06/04/2022)   Humiliation, Afraid, Rape, and Kick questionnaire    Fear of Current or Ex-Partner: No    Emotionally Abused: No    Physically Abused: No    Sexually Abused: No    FAMILY HISTORY: Family History  Problem Relation Age of Onset   Breast cancer Mother 51   Breast cancer Paternal Aunt        3 pat aunts   Liver disease Brother        died 15    ALLERGIES:  is allergic to pneumococcal vaccines, wound dressing adhesive, amoxicillin, codeine, and penicillins.  MEDICATIONS:  Current Outpatient Medications  Medication Sig  Dispense Refill   acetaminophen (TYLENOL) 500 MG tablet Take 1,000 mg by mouth at bedtime as needed for moderate pain.     celecoxib (CELEBREX) 200 MG capsule TAKE 1 CAPSULE DAILY 90 capsule 3   cholecalciferol (VITAMIN D3) 25 MCG (1000 UNIT) tablet Take 1,000 Units by mouth daily.     cyclobenzaprine (FLEXERIL) 10 MG tablet Take 1 tablet (10 mg total) by mouth at bedtime. 30 tablet 1   FREESTYLE LITE test strip TEST BLOOD SUGAR TWICE A DAY 100 strip 6   Lancets (FREESTYLE) lancets TEST BLOOD SUGAR TWICE A DAY 100 each 6   olmesartan-hydrochlorothiazide (BENICAR HCT) 40-25 MG tablet Take 1 tablet by mouth daily. 90 tablet 0   ondansetron (ZOFRAN) 8 MG tablet One pill every 8 hours as needed for nausea/vomitting. 40 tablet 1   OXYGEN Inhale into the lungs at bedtime.     phenazopyridine (PYRIDIUM) 95 MG tablet Take 95 mg by mouth 3 (three) times daily as needed for pain.     TAGRISSO 40 MG tablet TAKE 1 TABLET DAILY. 30 tablet 3   tirzepatide (MOUNJARO) 7.5 MG/0.5ML Pen Inject 7.5 mg into the skin once a week. 6 mL 1   prochlorperazine (COMPAZINE) 10 MG tablet Take 1 tablet (10 mg total) by mouth every 6 (six) hours as needed for nausea or vomiting. 90 tablet 3   No current facility-administered medications for this visit.    PHYSICAL EXAMINATION: ECOG PERFORMANCE STATUS: 0 - Asymptomatic  Vitals:   02/12/23 1002  BP: 133/70  Pulse: 88  Temp: 97.7 F (36.5 C)  SpO2: 96%     Filed Weights   02/12/23 1002  Weight: 285 lb 3.2 oz (129.4 kg)       Physical Exam Constitutional:      Comments: Obese.    HENT:     Head: Normocephalic and atraumatic.     Mouth/Throat:     Pharynx: No oropharyngeal exudate.  Eyes:     Pupils: Pupils are equal, round, and reactive to light.  Cardiovascular:     Rate and Rhythm: Normal rate and regular rhythm.  Pulmonary:     Effort: No respiratory distress.     Breath sounds: No wheezing.     Comments: Clear to auscultation  bilaterally. Abdominal:     General: Bowel sounds are normal. There is no distension.     Palpations: Abdomen is soft. There is no mass.     Tenderness: There is no abdominal  tenderness. There is no guarding or rebound.  Musculoskeletal:        General: No tenderness. Normal range of motion.     Cervical back: Normal range of motion and neck supple.  Skin:    General: Skin is warm.  Neurological:     Mental Status: She is alert and oriented to person, place, and time.  Psychiatric:        Mood and Affect: Affect normal.    Erythema of the ball of the left big toe.  No discharge noted.  LABORATORY DATA:  I have reviewed the data as listed Lab Results  Component Value Date   WBC 5.2 02/12/2023   HGB 13.1 02/12/2023   HCT 40.7 02/12/2023   MCV 86.0 02/12/2023   PLT 194 02/12/2023   Recent Labs    08/05/22 0947 11/05/22 1023 11/13/22 0915 02/12/23 0947  NA 134*  --  132* 137  K 4.4  --  4.0 4.1  CL 100  --  98 100  CO2 28  --  28 28  GLUCOSE 137*  --  122* 128*  BUN 24*  --  20 21  CREATININE 1.15* 1.40* 1.27* 1.14*  CALCIUM 9.0  --  9.1 9.2  GFRNONAA 50*  --  44* 50*  PROT 7.8  --  7.6 7.4  ALBUMIN 3.6  --  3.7 3.3*  AST 20  --  21 19  ALT 14  --  15 13  ALKPHOS 48  --  52 52  BILITOT 0.4  --  0.6 0.5    RADIOGRAPHIC STUDIES: I have personally reviewed the radiological images as listed and agreed with the findings in the report.   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 Osimertinib 80 mg once a day [07/31/2020] but currently on Osimetinib 40 mg/day [severe fatigue- since July 2023]; ] SEP 9th, 2024- Stable spiculated right upper lobe nodule;  No evidence of metastatic disease in the chest. Unchanged appearance of chronic interstitial lung disease. Stable.   # Continue osimertinib at 40 mg/day  no overt progression of disease [see above];  clinically-  stable. Will continue surveillance scans; will order today/2 months.    # Fatigue: likley sec to osimertinib. Continue  decreased the dose to 40 mg/day.  stable.  # CKD stage III monitor closely.  Recommend continued hydration control of blood pressure/diabetes. stable.  # INCIDENTAL Left sublingual fullness/ Incidental large thyroid goiter/nodule noted- s/p- tongue mass/thyroid nodule- OCT 2023- CT neck-stable.Monitor for now.-  stable.  #Chronic joint pains-continue Celebrex every other day [see below]; Tylenol as needed for pain- stable.  #Diabetes /type II -PBF- BG- 137; currently on Munjauro/ GLP-1 agonist- stable.  # IV access: PIV  # DISPOSITION:   # follow up in 3 months MD; labs- cbc/cmp/mag;CT chest prior- -Dr.B     All questions were answered. The patient knows to call the clinic with any problems, questions or concerns.    Earna Coder, MD 02/12/2023 11:10 AM

## 2023-02-12 NOTE — Assessment & Plan Note (Addendum)
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 Osimertinib 80 mg once a day [07/31/2020] but currently on Osimetinib 40 mg/day [severe fatigue- since July 2023]; ] SEP 9th, 2024- Stable spiculated right upper lobe nodule;  No evidence of metastatic disease in the chest. Unchanged appearance of chronic interstitial lung disease. Stable.   # Continue osimertinib at 40 mg/day  no overt progression of disease [see above]; clinically-  stable. Will continue surveillance scans; will order today/2 months.    # Fatigue: likley sec to osimertinib. Continue  decreased the dose to 40 mg/day.  stable.  # CKD stage III monitor closely.  Recommend continued hydration control of blood pressure/diabetes. stable.  # INCIDENTAL Left sublingual fullness/ Incidental large thyroid goiter/nodule noted- s/p- tongue mass/thyroid nodule- OCT 2023- CT neck-stable.Monitor for now.-  stable.  #Chronic joint pains-continue Celebrex every other day [see below]; Tylenol as needed for pain- stable.  #Diabetes /type II -PBF- BG- 137; currently on Munjauro/ GLP-1 agonist- stable.  # IV access: PIV  # DISPOSITION:   # follow up in 3 months MD; labs- cbc/cmp/mag;CT chest prior- -Dr.B

## 2023-02-12 NOTE — Progress Notes (Signed)
Refill compazine, pended. Having occasional nausea.  SOB, no worse, on O2 prn.

## 2023-02-13 ENCOUNTER — Other Ambulatory Visit: Payer: Self-pay | Admitting: Internal Medicine

## 2023-02-13 DIAGNOSIS — E118 Type 2 diabetes mellitus with unspecified complications: Secondary | ICD-10-CM

## 2023-02-13 NOTE — Telephone Encounter (Signed)
Is the patient getting a dose increase?  KP

## 2023-02-13 NOTE — Telephone Encounter (Signed)
Requested medications are due for refill today.  no  Requested medications are on the active medications list.  yes  Last refill. 10/04/2022 6mL 1 rf  Future visit scheduled.   yes  Notes to clinic.  Medication not assigned to a protocol.    Requested Prescriptions  Pending Prescriptions Disp Refills   MOUNJARO 7.5 MG/0.5ML Pen [Pharmacy Med Name: MOUNJARO PEN 0.5ML 4'S 7.5MG ] 6 mL 3    Sig: INJECT 7.5 MG UNDER THE SKIN WEEKLY     Off-Protocol Failed - 02/13/2023 10:18 AM      Failed - Medication not assigned to a protocol, review manually.      Passed - Valid encounter within last 12 months    Recent Outpatient Visits           4 months ago Essential (primary) hypertension   [place] [facility] at [facility], Nyoka Cowden, MD   8 months ago Type II diabetes mellitus with complication [facility])   [place] [facility] at [facility], Nyoka Cowden, MD   1 year ago Type II diabetes mellitus with complication [facility])   [place] [facility] at [facility], Nyoka Cowden, MD   1 year ago Type II diabetes mellitus with complication [facility])   [place] [facility] at [facility], Nyoka Cowden, MD   1 year ago Type II diabetes mellitus with complication [facility])    Primary Care & Sports Medicine at [facility], Nyoka Cowden, MD       Future Appointments             In 4 weeks Judithann Graves Nyoka Cowden, MD [facility] Primary Care & Sports Medicine at [facility], [facility]

## 2023-02-26 ENCOUNTER — Other Ambulatory Visit: Payer: Self-pay | Admitting: Internal Medicine

## 2023-02-26 DIAGNOSIS — I10 Essential (primary) hypertension: Secondary | ICD-10-CM

## 2023-03-03 NOTE — Telephone Encounter (Signed)
 Requested Prescriptions  Pending Prescriptions Disp Refills   olmesartan -hydrochlorothiazide (BENICAR  HCT) 40-25 MG tablet [Pharmacy Med Name: OLMESARTAN /HCTZ TABS 40/25MG ] 90 tablet 0    Sig: TAKE 1 TABLET DAILY     Cardiovascular: ARB + Diuretic Combos Failed - 03/03/2023  8:19 AM      Failed - Cr in normal range and within 180 days    Creatinine  Date Value Ref Range Status  02/12/2023 1.14 (H) 0.44 - 1.00 mg/dL Final  91/80/7985 9.06 0.60 - 1.30 mg/dL Final         Passed - K in normal range and within 180 days    Potassium  Date Value Ref Range Status  02/12/2023 4.1 3.5 - 5.1 mmol/L Final  01/22/2012 4.0 3.5 - 5.1 mmol/L Final         Passed - Na in normal range and within 180 days    Sodium  Date Value Ref Range Status  02/12/2023 137 135 - 145 mmol/L Final  10/06/2019 140 134 - 144 mmol/L Final  01/22/2012 136 136 - 145 mmol/L Final         Passed - eGFR is 10 or above and within 180 days    EGFR (African American)  Date Value Ref Range Status  10/13/2012 >60  Final   GFR calc Af Amer  Date Value Ref Range Status  10/06/2019 79 >59 mL/min/1.73 Final    Comment:    **[facility] currently reports eGFR in compliance with the current**   recommendations of the [employer]. [facility] will   update reporting as new guidelines are published from the NKF-ASN   Task force.    EGFR (Non-African Amer.)  Date Value Ref Range Status  10/13/2012 >60  Final    Comment:    eGFR values <20mL/min/1.73 m2 may be an indication of chronic kidney disease (CKD). Calculated eGFR is useful in patients with stable renal function. The eGFR calculation will not be reliable in acutely ill patients when serum creatinine is changing rapidly. It is not useful in  patients on dialysis. The eGFR calculation may not be applicable to patients at the low and high extremes of body sizes, pregnant women, and vegetarians.    GFR, Estimated  Date Value Ref Range Status  02/12/2023  50 (L) >60 mL/min Final    Comment:    (NOTE) Calculated using the CKD-EPI Creatinine Equation (2021)          Passed - Patient is not pregnant      Passed - Last BP in normal range    BP Readings from Last 1 Encounters:  02/12/23 133/70         Passed - Valid encounter within last 6 months    Recent Outpatient Visits           5 months ago Essential (primary) hypertension   [place] [facility] at [facility], Leita DEL, MD   9 months ago Type II diabetes mellitus with complication [facility])   [place] [facility] at [facility], Leita DEL, MD   1 year ago Type II diabetes mellitus with complication [facility])   [place] [facility] at [facility], Leita DEL, MD   1 year ago Type II diabetes mellitus with complication [facility])   [place] [facility] Medicine at [facility], Leita DEL, MD   1 year ago Type II diabetes mellitus with complication (HCC)  [facility] Primary Care & Sports Medicine at [facility], Leita DEL, MD       Future Appointments             In 1 week Justus Leita DEL, MD [facility] Primary Care & Sports Medicine at [facility], [facility]

## 2023-03-13 ENCOUNTER — Ambulatory Visit (INDEPENDENT_AMBULATORY_CARE_PROVIDER_SITE_OTHER): Payer: Medicare Other | Admitting: Internal Medicine

## 2023-03-13 ENCOUNTER — Encounter: Payer: Self-pay | Admitting: Internal Medicine

## 2023-03-13 VITALS — BP 128/70 | HR 67 | Ht 64.0 in | Wt 285.0 lb

## 2023-03-13 DIAGNOSIS — Z7985 Long-term (current) use of injectable non-insulin antidiabetic drugs: Secondary | ICD-10-CM

## 2023-03-13 DIAGNOSIS — E118 Type 2 diabetes mellitus with unspecified complications: Secondary | ICD-10-CM | POA: Diagnosis not present

## 2023-03-13 DIAGNOSIS — J849 Interstitial pulmonary disease, unspecified: Secondary | ICD-10-CM | POA: Diagnosis not present

## 2023-03-13 DIAGNOSIS — E785 Hyperlipidemia, unspecified: Secondary | ICD-10-CM

## 2023-03-13 DIAGNOSIS — I10 Essential (primary) hypertension: Secondary | ICD-10-CM | POA: Diagnosis not present

## 2023-03-13 DIAGNOSIS — E1169 Type 2 diabetes mellitus with other specified complication: Secondary | ICD-10-CM | POA: Diagnosis not present

## 2023-03-13 DIAGNOSIS — C3411 Malignant neoplasm of upper lobe, right bronchus or lung: Secondary | ICD-10-CM

## 2023-03-13 DIAGNOSIS — C770 Secondary and unspecified malignant neoplasm of lymph nodes of head, face and neck: Secondary | ICD-10-CM

## 2023-03-13 NOTE — Assessment & Plan Note (Signed)
Very high 10 yr risk in addition to DM She declines statin therapy

## 2023-03-13 NOTE — Assessment & Plan Note (Signed)
On Mounjaro to help with DM and weight control

## 2023-03-13 NOTE — Assessment & Plan Note (Signed)
 Controlled BP with normal exam. Current regimen is Benicar hct. Will continue same medications; encourage continued reduced sodium diet.

## 2023-03-13 NOTE — Assessment & Plan Note (Addendum)
Blood sugars stable without hypoglycemic symptoms or events. Currently managed with Mounjaro. Changes made last visit are none. Lab Results  Component Value Date   HGBA1C 6.8 (A) 10/04/2022

## 2023-03-13 NOTE — Assessment & Plan Note (Signed)
Using oxygen at night.   

## 2023-03-13 NOTE — Assessment & Plan Note (Signed)
Followed by oncology on Oseimetinib Last scan showed stable RUL nodule with no evidence of metastatic disease.

## 2023-03-13 NOTE — Assessment & Plan Note (Signed)
Doing well with stable disease.

## 2023-03-13 NOTE — Progress Notes (Signed)
Date:  03/13/2023   Name:  Megan Shields   DOB:  [birth year]   MRN:  [id number]   Chief Complaint: Annual Exam Megan Shields is a 78 y.o. female who presents today for her Complete Annual Exam. She feels well. She reports exercising - some. She reports she is sleeping well. Breast complaints - none.  She declines breast exam and mammogram.  She declines DEXA.  Health Maintenance  Topic Date Due   DTaP/Tdap/Td vaccine (1 - Tdap) Never done   Eye exam for diabetics  01/11/2023   COVID-19 Vaccine (3 - Pfizer risk series) 03/29/2023*   Flu Shot  05/26/2023*   DEXA scan (bone density measurement)  03/12/2024*   Zoster (Shingles) Vaccine (1 of 2) 10/09/2025*   Hemoglobin A1C  04/06/2023   Medicare Annual Wellness Visit  06/04/2023   Yearly kidney health urinalysis for diabetes  10/04/2023   Complete foot exam   10/04/2023   Yearly kidney function blood test for diabetes  02/12/2024   Hepatitis C Screening  Completed   HPV Vaccine  Aged Out   Pneumonia Vaccine  Discontinued   Mammogram  Discontinued  *Topic was postponed. The date shown is not the original due date.    Hypertension This is a chronic problem. The problem is controlled. Pertinent negatives include no chest pain, headaches, palpitations or shortness of breath. Past treatments include angiotensin blockers and diuretics. The current treatment provides significant improvement. Hypertensive end-organ damage includes kidney disease. There is no history of CAD/MI or CVA.  Diabetes She presents for her follow-up diabetic visit. She has type 2 diabetes mellitus. Her disease course has been stable. Pertinent negatives for hypoglycemia include no headaches or tremors. Pertinent negatives for diabetes include no chest pain, no fatigue, no polydipsia and no polyuria. Pertinent negatives for diabetic complications include no CVA.    Review of Systems  Constitutional:  Negative for appetite change, fatigue, fever and unexpected weight  change.  HENT:  Negative for tinnitus and trouble swallowing.   Eyes:  Negative for visual disturbance.  Respiratory:  Negative for cough, chest tightness and shortness of breath.   Cardiovascular:  Negative for chest pain, palpitations and leg swelling.  Gastrointestinal:  Negative for abdominal pain.  Endocrine: Negative for polydipsia and polyuria.  Genitourinary:  Negative for dysuria and hematuria.  Musculoskeletal:  Negative for arthralgias.  Neurological:  Negative for tremors, numbness and headaches.  Psychiatric/Behavioral:  Negative for dysphoric mood.      Lab Results  Component Value Date   NA 137 02/12/2023   K 4.1 02/12/2023   CO2 28 02/12/2023   GLUCOSE 128 (H) 02/12/2023   BUN 21 02/12/2023   CREATININE 1.14 (H) 02/12/2023   CALCIUM 9.2 02/12/2023   GFRNONAA 50 (L) 02/12/2023   Lab Results  Component Value Date   CHOL 192 11/26/2021   HDL 75 11/26/2021   LDLCALC 98 11/26/2021   TRIG 107 11/26/2021   CHOLHDL 2.6 11/26/2021   Lab Results  Component Value Date   TSH 1.680 10/06/2019   Lab Results  Component Value Date   HGBA1C 6.8 (A) 10/04/2022   Lab Results  Component Value Date   WBC 5.2 02/12/2023   HGB 13.1 02/12/2023   HCT 40.7 02/12/2023   MCV 86.0 02/12/2023   PLT 194 02/12/2023   Lab Results  Component Value Date   ALT 13 02/12/2023   AST 19 02/12/2023   ALKPHOS 52 02/12/2023   BILITOT 0.5 02/12/2023   Lab Results  Component Value Date   VD25OH 34.9 03/07/2020     Patient Active Problem List   Diagnosis Date Noted   Obesity, morbid (HCC) 03/13/2023   Secondary and unspecified malignant neoplasm of lymph nodes of head, 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   ILD (interstitial lung disease) (HCC) 01/30/2022   Enlarged thyroid gland 11/26/2021   Drug-induced neutropenia (HCC) 07/17/2020   Primary cancer of right upper lobe of lung (HCC) 06/30/2020   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   Vitamin D deficiency 10/06/2019    Rosacea 04/08/2019   Hyperlipidemia associated with type 2 diabetes mellitus (HCC) 04/08/2019   Vertigo 04/07/2018   Tubular adenoma of colon 03/10/2017   Tinea pedis of left foot 11/22/2016   Type II diabetes mellitus with complication (HCC) 07/06/2015   Essential (primary) hypertension 08/01/2014   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 Tears off skin. blisters   Amoxicillin Rash   Codeine     Other reaction(s): drowsiness   Penicillins Rash    Past Surgical History:  Procedure Laterality Date   BLEPHAROPLASTY Bilateral 12/19/2017   CARPAL TUNNEL RELEASE Bilateral 1985   CATARACT EXTRACTION W/PHACO Right 05/22/2022   Procedure: CATARACT EXTRACTION PHACO AND INTRAOCULAR LENS PLACEMENT (IOC) RIGHT DIABETIC;  Surgeon: Lockie Mola, MD;  Location: [facility];  Service: Ophthalmology;  Laterality: Right;  3.79 0:41.1   CATARACT EXTRACTION W/PHACO Left 06/12/2022   Procedure: CATARACT EXTRACTION PHACO AND INTRAOCULAR LENS PLACEMENT (IOC) LEFT DIABETIC  6.12  00:46.2;  Surgeon: Lockie Mola, MD;  Location: [facility];  Service: Ophthalmology;  Laterality: Left;  Diabetic   CHOLECYSTECTOMY     COLONOSCOPY WITH PROPOFOL N/A 03/07/2017   Procedure: COLONOSCOPY WITH PROPOFOL;  Surgeon: Wyline Mood, MD;  Location: [facility];  Service: Gastroenterology;  Laterality: N/A;   DILATION AND CURETTAGE OF UTERUS  12/2011   benign endometrial polyp   HALLUX VALGUS AUSTIN Right 09/02/2014   Procedure: HALLUX VALGUS AUSTIN;  Surgeon: Gwyneth Revels, DPM;  Location: [facility];  Service: Podiatry;  Laterality: Right;   HERNIA REPAIR     JOINT REPLACEMENT Bilateral    TKR   LAPAROSCOPIC GASTRIC BANDING  2010   Lap Band   LAPAROSCOPIC  TOTAL HYSTERECTOMY  12/23/2019   lymph node removed Right    TOTAL KNEE ARTHROPLASTY Bilateral 2003, 2004   TOTAL KNEE ARTHROPLASTY Bilateral    VENTRAL HERNIA REPAIR  2008    Social History   Tobacco Use   Smoking status: Never   Smokeless tobacco: Never  Vaping Use   Vaping status: Never Used  Substance Use Topics   Alcohol use: No    Alcohol/week: 0.0 standard drinks of alcohol   Drug use: No     Medication list has been reviewed and updated.  Current Meds  Medication Sig   acetaminophen (TYLENOL) 500 MG tablet Take 1,000 mg by mouth at bedtime as needed for moderate pain.   celecoxib (CELEBREX) 200 MG capsule TAKE 1 CAPSULE DAILY   cholecalciferol (VITAMIN D3) 25 MCG (1000 UNIT) tablet Take 1,000 Units by mouth daily.   cyclobenzaprine (FLEXERIL) 10 MG tablet Take 1 tablet (10 mg total) by mouth at bedtime.   FREESTYLE LITE test strip TEST BLOOD SUGAR TWICE A DAY   Lancets (  FREESTYLE) lancets TEST BLOOD SUGAR TWICE A DAY   MOUNJARO 7.5 MG/0.5ML Pen INJECT 7.5 MG UNDER THE SKIN WEEKLY   olmesartan-hydrochlorothiazide (BENICAR HCT) 40-25 MG tablet TAKE 1 TABLET DAILY   ondansetron (ZOFRAN) 8 MG tablet One pill every 8 hours as needed for nausea/vomitting.   OXYGEN Inhale into the lungs at bedtime.   prochlorperazine (COMPAZINE) 10 MG tablet Take 1 tablet (10 mg total) by mouth every 6 (six) hours as needed for nausea or vomiting.   Pumpkin Seed-Soy Germ (AZO BLADDER CONTROL/GO-LESS PO) Take by mouth.   TAGRISSO 40 MG tablet TAKE 1 TABLET DAILY.   [DISCONTINUED] phenazopyridine (PYRIDIUM) 95 MG tablet Take 95 mg by mouth 3 (three) times daily as needed for pain.       03/13/2023    8:29 AM 10/04/2022    8:37 AM 06/04/2022    3:19 PM 01/30/2022   11:16 AM  GAD 7 : Generalized Anxiety Score  Nervous, Anxious, on Edge 0 0 0 0  Control/stop worrying 0 0 0 0  Worry too much - different things 0 0 0 0  Trouble relaxing 0 0 0 0  Restless 0 0 0 0  Easily annoyed or irritable 0  0 0 0  Afraid - awful might happen 0 0 0 0  Total GAD 7 Score 0 0 0 0  Anxiety Difficulty Not difficult at all Not difficult at all Not difficult at all Not difficult at all       03/13/2023    8:29 AM 10/04/2022    8:37 AM 06/04/2022    3:19 PM  Depression screen PHQ 2/9  Decreased Interest 0 0 1  Down, Depressed, Hopeless 0 0 0  PHQ - 2 Score 0 0 1  Altered sleeping 0 0 1  Tired, decreased energy 3 3 3   Change in appetite 0 0 0  Feeling bad or failure about yourself  0 0 0  Trouble concentrating 0 0 0  Moving slowly or fidgety/restless 0 0 0  Suicidal thoughts 0 0 0  PHQ-9 Score 3 3 5   Difficult doing work/chores Not difficult at all Not difficult at all Not difficult at all    BP Readings from Last 3 Encounters:  03/13/23 128/70  02/12/23 133/70  11/13/22 (!) 132/59    Physical Exam Vitals and nursing note reviewed.  Constitutional:      General: She is not in acute distress.    Appearance: She is well-developed.  HENT:     Head: Normocephalic and atraumatic.     Right Ear: Tympanic membrane and ear canal normal.     Left Ear: Tympanic membrane and ear canal normal.     Nose:     Right Sinus: No maxillary sinus tenderness.     Left Sinus: No maxillary sinus tenderness.  Eyes:     General: No scleral icterus.       Right eye: No discharge.        Left eye: No discharge.     Conjunctiva/sclera: Conjunctivae normal.  Neck:     Thyroid: No thyromegaly.     Vascular: No carotid bruit.  Cardiovascular:     Rate and Rhythm: Normal rate and regular rhythm.     Pulses: Normal pulses.     Heart sounds: Normal heart sounds.  Pulmonary:     Effort: Pulmonary effort is normal. No respiratory distress.     Breath sounds: No wheezing.  Abdominal:     General: Bowel sounds are normal.  Palpations: Abdomen is soft.     Tenderness: There is no abdominal tenderness.  Musculoskeletal:     Cervical back: Normal range of motion. No erythema.     Right lower leg: No edema.      Left lower leg: No edema.  Lymphadenopathy:     Cervical: No cervical adenopathy.  Skin:    General: Skin is warm and dry.     Findings: No rash.  Neurological:     Mental Status: She is alert and oriented to person, place, and time.     Cranial Nerves: No cranial nerve deficit.     Sensory: No sensory deficit.     Deep Tendon Reflexes: Reflexes are normal and symmetric.  Psychiatric:        Attention and Perception: Attention normal.        Mood and Affect: Mood normal.     Wt Readings from Last 3 Encounters:  03/13/23 285 lb (129.3 kg)  02/12/23 285 lb 3.2 oz (129.4 kg)  11/13/22 289 lb 8 oz (131.3 kg)    BP 128/70   Pulse 67   Ht 5\' 4"  (1.626 m)   Wt 285 lb (129.3 kg)   SpO2 98%   BMI 48.92 kg/m   Assessment and Plan:  Problem List Items Addressed This Visit       Unprioritized   Essential (primary) hypertension - Primary (Chronic)   Controlled BP with normal exam. Current regimen is Benicar hct. Will continue same medications; encourage continued reduced sodium diet.       Relevant Orders   Comprehensive metabolic panel   TSH   Type II diabetes mellitus with complication (HCC) (Chronic)   Blood sugars stable without hypoglycemic symptoms or events. Currently managed with Mounjaro. Changes made last visit are none. Lab Results  Component Value Date   HGBA1C 6.8 (A) 10/04/2022         Relevant Orders   Comprehensive metabolic panel   Hemoglobin A1c   Hyperlipidemia associated with type 2 diabetes mellitus (HCC) (Chronic)   Very high 10 yr risk in addition to DM She declines statin therapy      Relevant Orders   Lipid panel   Primary cancer of right upper lobe of lung (HCC) (Chronic)   Followed by oncology on Oseimetinib Last scan showed stable RUL nodule with no evidence of metastatic disease.      Secondary and unspecified malignant neoplasm of lymph nodes of head, face and neck (HCC)   Doing well with stable disease.      ILD  (interstitial lung disease) (HCC)   Using oxygen at night.      Obesity, morbid (HCC)   On Mounjaro to help with DM and weight control      Other Visit Diagnoses       Long-term current use of injectable noninsulin antidiabetic medication           Return in about 4 months (around 07/11/2023) for DM, HTN.    Reubin Milan, MD [facility] [place]

## 2023-03-14 ENCOUNTER — Encounter: Payer: Self-pay | Admitting: Internal Medicine

## 2023-03-14 LAB — COMPREHENSIVE METABOLIC PANEL
ALT: 12 [IU]/L (ref 0–32)
AST: 19 [IU]/L (ref 0–40)
Albumin: 3.8 g/dL (ref 3.8–4.8)
Alkaline Phosphatase: 65 [IU]/L (ref 44–121)
BUN/Creatinine Ratio: 19 (ref 12–28)
BUN: 25 mg/dL (ref 8–27)
Bilirubin Total: 0.4 mg/dL (ref 0.0–1.2)
CO2: 23 mmol/L (ref 20–29)
Calcium: 9.7 mg/dL (ref 8.7–10.3)
Chloride: 101 mmol/L (ref 96–106)
Creatinine, Ser: 1.33 mg/dL — ABNORMAL HIGH (ref 0.57–1.00)
Globulin, Total: 3.2 g/dL (ref 1.5–4.5)
Glucose: 132 mg/dL — ABNORMAL HIGH (ref 70–99)
Potassium: 4.7 mmol/L (ref 3.5–5.2)
Sodium: 141 mmol/L (ref 134–144)
Total Protein: 7 g/dL (ref 6.0–8.5)
eGFR: 42 mL/min/{1.73_m2} — ABNORMAL LOW (ref >59)

## 2023-03-14 LAB — LIPID PANEL
Chol/HDL Ratio: 2.3 {ratio} (ref 0.0–4.4)
Cholesterol, Total: 164 mg/dL (ref 100–199)
HDL: 71 mg/dL (ref >39)
LDL Chol Calc (NIH): 79 mg/dL (ref 0–99)
Triglycerides: 76 mg/dL (ref 0–149)
VLDL Cholesterol Cal: 14 mg/dL (ref 5–40)

## 2023-03-14 LAB — HEMOGLOBIN A1C
Est. average glucose Bld gHb Est-mCnc: 154 mg/dL
Hgb A1c MFr Bld: 7 % — ABNORMAL HIGH (ref 4.8–5.6)

## 2023-03-14 LAB — TSH: TSH: 1.47 u[IU]/mL (ref 0.450–4.500)

## 2023-04-15 ENCOUNTER — Other Ambulatory Visit: Payer: Self-pay | Admitting: Internal Medicine

## 2023-04-15 DIAGNOSIS — C3411 Malignant neoplasm of upper lobe, right bronchus or lung: Secondary | ICD-10-CM

## 2023-04-16 ENCOUNTER — Encounter: Payer: Self-pay | Admitting: Internal Medicine

## 2023-05-05 ENCOUNTER — Ambulatory Visit
Admission: RE | Admit: 2023-05-05 | Discharge: 2023-05-05 | Disposition: A | Discharge status: Home / Self Care | Payer: TRICARE For Life (TFL) | Source: Ambulatory Visit | Attending: Internal Medicine | Admitting: Internal Medicine

## 2023-05-05 DIAGNOSIS — C3411 Malignant neoplasm of upper lobe, right bronchus or lung: Secondary | ICD-10-CM | POA: Diagnosis not present

## 2023-05-05 DIAGNOSIS — I700 Atherosclerosis of aorta: Secondary | ICD-10-CM | POA: Diagnosis not present

## 2023-05-05 DIAGNOSIS — J849 Interstitial pulmonary disease, unspecified: Secondary | ICD-10-CM | POA: Diagnosis not present

## 2023-05-05 DIAGNOSIS — I771 Stricture of artery: Secondary | ICD-10-CM | POA: Diagnosis not present

## 2023-05-05 MED ORDER — IOHEXOL 300 MG/ML  SOLN
75.0000 mL | Freq: Once | INTRAMUSCULAR | Status: AC | PRN
  Administered 2023-05-05: 75 mL via INTRAVENOUS

## 2023-05-13 ENCOUNTER — Encounter: Payer: Self-pay | Admitting: Internal Medicine

## 2023-05-13 ENCOUNTER — Inpatient Hospital Stay: Payer: TRICARE For Life (TFL) | Attending: Internal Medicine

## 2023-05-13 ENCOUNTER — Inpatient Hospital Stay (HOSPITAL_BASED_OUTPATIENT_CLINIC_OR_DEPARTMENT_OTHER): Payer: TRICARE For Life (TFL) | Admitting: Internal Medicine

## 2023-05-13 VITALS — BP 145/67 | HR 64 | Temp 97.3°F | Resp 18 | Wt 284.0 lb

## 2023-05-13 DIAGNOSIS — Z79899 Other long term (current) drug therapy: Secondary | ICD-10-CM | POA: Diagnosis not present

## 2023-05-13 DIAGNOSIS — R0609 Other forms of dyspnea: Secondary | ICD-10-CM | POA: Insufficient documentation

## 2023-05-13 DIAGNOSIS — Z7985 Long-term (current) use of injectable non-insulin antidiabetic drugs: Secondary | ICD-10-CM | POA: Diagnosis not present

## 2023-05-13 DIAGNOSIS — C3411 Malignant neoplasm of upper lobe, right bronchus or lung: Secondary | ICD-10-CM | POA: Insufficient documentation

## 2023-05-13 DIAGNOSIS — E1122 Type 2 diabetes mellitus with diabetic chronic kidney disease: Secondary | ICD-10-CM | POA: Diagnosis not present

## 2023-05-13 DIAGNOSIS — N183 Chronic kidney disease, stage 3 unspecified: Secondary | ICD-10-CM | POA: Diagnosis not present

## 2023-05-13 DIAGNOSIS — I129 Hypertensive chronic kidney disease with stage 1 through stage 4 chronic kidney disease, or unspecified chronic kidney disease: Secondary | ICD-10-CM | POA: Diagnosis not present

## 2023-05-13 LAB — CBC WITH DIFFERENTIAL (CANCER CENTER ONLY)
Abs Immature Granulocytes: 0.01 10*3/uL (ref 0.00–0.07)
Basophils Absolute: 0 10*3/uL (ref 0.0–0.1)
Basophils Relative: 1 %
Eosinophils Absolute: 0.2 10*3/uL (ref 0.0–0.5)
Eosinophils Relative: 6 %
HCT: 42.8 % (ref 36.0–46.0)
Hemoglobin: 13.5 g/dL (ref 12.0–15.0)
Immature Granulocytes: 0 %
Lymphocytes Relative: 26 %
Lymphs Abs: 1 10*3/uL (ref 0.7–4.0)
MCH: 27.6 pg (ref 26.0–34.0)
MCHC: 31.5 g/dL (ref 30.0–36.0)
MCV: 87.3 fL (ref 80.0–100.0)
Monocytes Absolute: 0.5 10*3/uL (ref 0.1–1.0)
Monocytes Relative: 12 %
Neutro Abs: 2.2 10*3/uL (ref 1.7–7.7)
Neutrophils Relative %: 55 %
Platelet Count: 180 10*3/uL (ref 150–400)
RBC: 4.9 MIL/uL (ref 3.87–5.11)
RDW: 14.5 % (ref 11.5–15.5)
WBC Count: 3.9 10*3/uL — ABNORMAL LOW (ref 4.0–10.5)
nRBC: 0 % (ref 0.0–0.2)

## 2023-05-13 LAB — CMP (CANCER CENTER ONLY)
ALT: 15 U/L (ref 0–44)
AST: 23 U/L (ref 15–41)
Albumin: 3.5 g/dL (ref 3.5–5.0)
Alkaline Phosphatase: 50 U/L (ref 38–126)
Anion gap: 9 (ref 5–15)
BUN: 23 mg/dL (ref 8–23)
CO2: 27 mmol/L (ref 22–32)
Calcium: 9.2 mg/dL (ref 8.9–10.3)
Chloride: 98 mmol/L (ref 98–111)
Creatinine: 1.32 mg/dL — ABNORMAL HIGH (ref 0.44–1.00)
GFR, Estimated: 42 mL/min — ABNORMAL LOW (ref >60)
Glucose, Bld: 117 mg/dL — ABNORMAL HIGH (ref 70–99)
Potassium: 4.3 mmol/L (ref 3.5–5.1)
Sodium: 134 mmol/L — ABNORMAL LOW (ref 135–145)
Total Bilirubin: 0.6 mg/dL (ref 0.0–1.2)
Total Protein: 7.3 g/dL (ref 6.5–8.1)

## 2023-05-13 LAB — MAGNESIUM: Magnesium: 1.8 mg/dL (ref 1.7–2.4)

## 2023-05-13 NOTE — Progress Notes (Signed)
 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  [Dr.Byrnett & Dr.Fleming]; May 2022-MRI brain-NEG.   # MAY 11th, 2022- carbo-alimta x1 cycle; discontinued; June 6th, 2022-osimertinib 80 mg a day  #Thyroid -enlargement; recommend biopsy per radiology; patient /husband has been reluctant  # Incidental-out of place Gastric band [since 2014] gastric band.  Asymptomatic monitor for now  # NGS/MOLECULAR TESTS:POSITIVE for EGFR mutation deletion 19    # PALLIATIVE CARE EVALUATION:  # PAIN MANAGEMENT:    DIAGNOSIS: Lung cancer  STAGE:   IIIC      ;  GOALS:Palliatve  CURRENT/MOST RECENT THERAPY : Osiemertinib      Primary cancer of right upper lobe of lung (HCC)  06/30/2020 Initial Diagnosis   Primary cancer of right upper lobe of lung (HCC)   06/30/2020 Cancer Staging   Staging form: Lung, AJCC 8th Edition - Clinical: Stage IIIC (cT4, cN3, cM0) - Signed by Earna Coder, MD on 06/30/2020 Histopathologic type: Adenocarcinoma, NOS   07/05/2020 - 07/05/2020 Chemotherapy   Patient is on Treatment Plan : LUNG CARBOplatin / Pemetrexed / Pembrolizumab q21d Induction x 4 cycles / Maintenance Pemetrexed + Pembrolizumab      HISTORY OF PRESENTING ILLNESS:  She is accompanied by husband.  Ambulating independently.   Megan Shields 78 y.o.  female  Stage IV/adenocarcinoma the lung EGFR mutated-currently on osimertinib is here for  follow-up/ and review the results of the CT scan.   Patient having trouble staying asleep, ongoing for about 2 weeks now. Patient had a CT scan on 3/10/202   Pt has have chronic mild dyspnea- not any worse.  Otherwise denies any worsening cough.  Admits to compliance with her Tagrisso.    Denies any chest pain. Mild chronic Swelling in the legs. No nausea no vomiting .  No diarrhea.  Chronic joint pains.   Review of Systems  Constitutional:  Positive for malaise/fatigue. Negative for chills, diaphoresis, fever and weight loss.  HENT:  Negative for nosebleeds and sore throat.   Eyes:  Negative for double vision.  Respiratory:  Negative for hemoptysis, sputum production and wheezing.   Cardiovascular:  Negative for chest pain, palpitations, orthopnea and leg swelling.  Gastrointestinal:  Negative for abdominal pain, blood in stool, constipation, diarrhea, heartburn, melena, nausea and vomiting.  Genitourinary:  Negative for dysuria, frequency and urgency.  Musculoskeletal:  Negative for back pain and joint pain.  Neurological:  Negative for dizziness, tingling, focal weakness, weakness and headaches.  Endo/Heme/Allergies:  Does not bruise/bleed easily.  Psychiatric/Behavioral:  Negative for depression. The patient is not nervous/anxious and does not have insomnia.      MEDICAL HISTORY:  Past Medical History:  Diagnosis Date   Anemia    vitamin d deficiency   Arthritis    Chronic cough    Depression    Diabetes mellitus without complication (HCC)    Dyspnea    Hypertension    ILD (interstitial lung disease) (HCC)    Obesity    BMI 49.44   Pneumonia    Pneumonia due to 2019 novel coronavirus  2022   community acquired also. covid positive May 2022   PONV (postoperative nausea and vomiting)    Primary cancer of right upper lobe of lung (HCC) 06/30/2020   Rosacea    Shoulder contusion 11/22/2015   Vertigo    none since 3/23    SURGICAL HISTORY: Past Surgical History:   Procedure Laterality Date   BLEPHAROPLASTY Bilateral 12/19/2017   CARPAL TUNNEL RELEASE Bilateral 1985   CATARACT EXTRACTION W/PHACO Right 05/22/2022   Procedure: CATARACT EXTRACTION PHACO AND INTRAOCULAR LENS PLACEMENT (IOC) RIGHT DIABETIC;  Surgeon: Lockie Mola, MD;  Location: [facility];  Service: Ophthalmology;  Laterality: Right;  3.79 0:41.1   CATARACT EXTRACTION W/PHACO Left 06/12/2022   Procedure: CATARACT EXTRACTION PHACO AND INTRAOCULAR LENS PLACEMENT (IOC) LEFT DIABETIC  6.12  00:46.2;  Surgeon: Lockie Mola, MD;  Location: [facility];  Service: Ophthalmology;  Laterality: Left;  Diabetic   CHOLECYSTECTOMY     COLONOSCOPY WITH PROPOFOL N/A 03/07/2017   Procedure: COLONOSCOPY WITH PROPOFOL;  Surgeon: Wyline Mood, MD;  Location: [facility];  Service: Gastroenterology;  Laterality: N/A;   DILATION AND CURETTAGE OF UTERUS  12/2011   benign endometrial polyp   HALLUX VALGUS AUSTIN Right 09/02/2014   Procedure: HALLUX VALGUS AUSTIN;  Surgeon: Gwyneth Revels, DPM;  Location: [facility];  Service: Podiatry;  Laterality: Right;   HERNIA REPAIR     JOINT REPLACEMENT Bilateral    TKR   LAPAROSCOPIC GASTRIC BANDING  2010   Lap Band   LAPAROSCOPIC TOTAL HYSTERECTOMY  12/23/2019   lymph node removed Right    TOTAL KNEE ARTHROPLASTY Bilateral 2003, 2004   TOTAL KNEE ARTHROPLASTY Bilateral    VENTRAL HERNIA REPAIR  2008    SOCIAL HISTORY: Social History   Socioeconomic History   Marital status: Married    Spouse name: Peyton Najjar   Number of children: Not on file   Years of education: Not on file   Highest education level: Not on file  Occupational History   Occupation: Therapist, music    Comment: RETIRED  Tobacco Use   Smoking status: Never   Smokeless tobacco: Never  Vaping Use   Vaping status: Never Used  Substance and Sexual Activity   Alcohol use: No    Alcohol/week: 0.0 standard drinks of alcohol   Drug use: No   Sexual activity: Not  Currently  Other Topics Concern   Not on file  Social History Narrative   Never smoked; no alcohol. Lives in [place] with husband . Home maker.    Daughter lives next door. 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   Social Drivers of Health   Financial Resource Strain: Low Risk  (06/04/2022)   Overall Financial Resource Strain (CARDIA)    Difficulty of Paying Living Expenses: Not hard at all  Food Insecurity: No Food Insecurity (06/04/2022)   Hunger Vital Sign    Worried About Running Out of Food in the Last Year: Never true    Ran Out of Food in the Last Year: Never true  Transportation Needs: No Transportation Needs (06/04/2022)   PRAPARE - Administrator, Civil Service (Medical): No    Lack of Transportation (Non-Medical): No  Physical Activity: Inactive (06/04/2022)   Exercise Vital Sign    Days of Exercise per Week: 0 days    Minutes of Exercise per Session: 0 min  Stress: No Stress Concern Present (06/04/2022)   [employer] of Occupational Health - Occupational Stress Questionnaire    Feeling of  Stress : Not at all  Social Connections: Moderately Isolated (11/22/2019)   Social Connection and Isolation Panel [NHANES]    Frequency of Communication with Friends and Family: More than three times a week    Frequency of Social Gatherings with Friends and Family: More than three times a week    Attends Religious Services: Never    Database administrator or Organizations: No    Attends Banker Meetings: Never    Marital Status: Married  Catering manager Violence: Not At Risk (06/04/2022)   Humiliation, Afraid, Rape, and Kick questionnaire    Fear of Current or Ex-Partner: No    Emotionally Abused: No    Physically Abused: No    Sexually Abused: No    FAMILY HISTORY: Family History  Problem Relation Age of Onset   Breast cancer Mother 68   Breast cancer Paternal Aunt        3 pat aunts   Liver disease Brother        died 75    ALLERGIES:  is  allergic to pneumococcal vaccines, wound dressing adhesive, amoxicillin, codeine, and penicillins.  MEDICATIONS:  Current Outpatient Medications  Medication Sig Dispense Refill   acetaminophen (TYLENOL) 500 MG tablet Take 1,000 mg by mouth at bedtime as needed for moderate pain.     celecoxib (CELEBREX) 200 MG capsule TAKE 1 CAPSULE DAILY 90 capsule 3   cholecalciferol (VITAMIN D3) 25 MCG (1000 UNIT) tablet Take 1,000 Units by mouth daily.     cyclobenzaprine (FLEXERIL) 10 MG tablet Take 1 tablet (10 mg total) by mouth at bedtime. 30 tablet 1   FREESTYLE LITE test strip TEST BLOOD SUGAR TWICE A DAY 100 strip 6   Lancets (FREESTYLE) lancets TEST BLOOD SUGAR TWICE A DAY 100 each 6   MOUNJARO 7.5 MG/0.5ML Pen INJECT 7.5 MG UNDER THE SKIN WEEKLY 6 mL 3   olmesartan-hydrochlorothiazide (BENICAR HCT) 40-25 MG tablet TAKE 1 TABLET DAILY 90 tablet 0   ondansetron (ZOFRAN) 8 MG tablet One pill every 8 hours as needed for nausea/vomitting. 40 tablet 1   OXYGEN Inhale into the lungs at bedtime.     prochlorperazine (COMPAZINE) 10 MG tablet Take 1 tablet (10 mg total) by mouth every 6 (six) hours as needed for nausea or vomiting. 90 tablet 3   Pumpkin Seed-Soy Germ (AZO BLADDER CONTROL/GO-LESS PO) Take by mouth.     TAGRISSO 40 MG tablet TAKE 1 TABLET DAILY. 90 tablet 0   No current facility-administered medications for this visit.    PHYSICAL EXAMINATION: ECOG PERFORMANCE STATUS: 0 - Asymptomatic  Vitals:   05/13/23 1031  BP: (!) 145/67  Pulse: 64  Resp: 18  Temp: (!) 97.3 F (36.3 C)  SpO2: 99%     Filed Weights   05/13/23 1031  Weight: 284 lb (128.8 kg)       Physical Exam Constitutional:      Comments: Obese.    HENT:     Head: Normocephalic and atraumatic.     Mouth/Throat:     Pharynx: No oropharyngeal exudate.  Eyes:     Pupils: Pupils are equal, round, and reactive to light.  Cardiovascular:     Rate and Rhythm: Normal rate and regular rhythm.  Pulmonary:      Effort: No respiratory distress.     Breath sounds: No wheezing.     Comments: Clear to auscultation bilaterally. Abdominal:     General: Bowel sounds are normal. There is no distension.  Palpations: Abdomen is soft. There is no mass.     Tenderness: There is no abdominal tenderness. There is no guarding or rebound.  Musculoskeletal:        General: No tenderness. Normal range of motion.     Cervical back: Normal range of motion and neck supple.  Skin:    General: Skin is warm.  Neurological:     Mental Status: She is alert and oriented to person, place, and time.  Psychiatric:        Mood and Affect: Affect normal.    Erythema of the ball of the left big toe.  No discharge noted.  LABORATORY DATA:  I have reviewed the data as listed Lab Results  Component Value Date   WBC 3.9 (L) 05/13/2023   HGB 13.5 05/13/2023   HCT 42.8 05/13/2023   MCV 87.3 05/13/2023   PLT 180 05/13/2023   Recent Labs    11/13/22 0915 02/12/23 0947 03/13/23 0856 05/13/23 1014  NA 132* 137 141 134*  K 4.0 4.1 4.7 4.3  CL 98 100 101 98  CO2 28 28 23 27   GLUCOSE 122* 128* 132* 117*  BUN 20 21 25 23   CREATININE 1.27* 1.14* 1.33* 1.32*  CALCIUM 9.1 9.2 9.7 9.2  GFRNONAA 44* 50*  --  42*  PROT 7.6 7.4 7.0 7.3  ALBUMIN 3.7 3.3* 3.8 3.5  AST 21 19 19 23   ALT 15 13 12 15   ALKPHOS 52 52 65 50  BILITOT 0.6 0.5 0.4 0.6    RADIOGRAPHIC STUDIES: I have personally reviewed the radiological images as listed and agreed with the findings in the report.   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 Osimertinib 80 mg once a day [07/31/2020] but currently on Osimetinib 40 mg/day [severe fatigue- since July 2023]; ]MARCH 10th, 2025-  Stable spiculated right upper lobe nodule;  No evidence of metastatic disease in the chest. Unchanged appearance of chronic interstitial lung disease. Stable.   #  Continue osimertinib at 40 mg/day  no overt progression of disease [see above]; clinically-  stable. Will continue surveillance scans; will order today/2 months.   # # Myalgias- ? Etiology- recommend doubling up on MVT with vit D- 10000 if not improved- reach to PCP- if any further concerns then will order bone scan.    # Fatigue: likley sec to osimertinib. Continue  decreased the dose to 40 mg/day.  stable.  # CKD stage III monitor closely.  Recommend continued hydration control of blood pressure/diabetes. stable.  # INCIDENTAL Left sublingual fullness/ Incidental large thyroid goiter/nodule noted- s/p- tongue mass/thyroid nodule- OCT 2023- CT neck-stable.Monitor for now.-   stable.  #Chronic joint pains-continue Celebrex every other day [see below]; Tylenol as needed for pain- stable.  #Diabetes /type II -PBF- BG- 116; currently on Munjauro/ GLP-1 agonist- stable.  # IV access: PIV  # DISPOSITION:   # follow up in 3 months MD; labs- cbc/cmp/mag- - -Dr.B   # I reviewed the blood work- with the patient in detail; also reviewed the imaging independently [as summarized above]; and with the patient in detail.      All questions were answered. The patient knows to call the clinic with any problems, questions or concerns.    Earna Coder, MD 05/13/2023 11:29 AM

## 2023-05-13 NOTE — Assessment & Plan Note (Signed)
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 Osimertinib 80 mg once a day [07/31/2020] but currently on Osimetinib 40 mg/day [severe fatigue- since July 2023]; ]MARCH 10th, 2025-  Stable spiculated right upper lobe nodule;  No evidence of metastatic disease in the chest. Unchanged appearance of chronic interstitial lung disease. Stable.   # Continue osimertinib at 40 mg/day  no overt progression of disease [see above]; clinically-  stable. Will continue surveillance scans; will order today/2 months.   # # Myalgias- ? Etiology- recommend doubling up on MVT with vit D- 10000 if not improved- reach to PCP- if any further concerns then will order bone scan.    # Fatigue: likley sec to osimertinib. Continue  decreased the dose to 40 mg/day.  stable.  # CKD stage III monitor closely.  Recommend continued hydration control of blood pressure/diabetes. stable.  # INCIDENTAL Left sublingual fullness/ Incidental large thyroid goiter/nodule noted- s/p- tongue mass/thyroid nodule- OCT 2023- CT neck-stable.Monitor for now.-   stable.  #Chronic joint pains-continue Celebrex every other day [see below]; Tylenol as needed for pain- stable.  #Diabetes /type II -PBF- BG- 116; currently on Munjauro/ GLP-1 agonist- stable.  # IV access: PIV  # DISPOSITION:   # follow up in 3 months MD; labs- cbc/cmp/mag- - -Dr.B   # I reviewed the blood work- with the patient in detail; also reviewed the imaging independently [as summarized above]; and with the patient in detail.

## 2023-05-13 NOTE — Progress Notes (Signed)
 Patient having trouble staying asleep, ongoing for about 2 weeks now.  Patient had a CT scan on 05/05/2023.

## 2023-05-21 ENCOUNTER — Other Ambulatory Visit: Payer: Self-pay | Admitting: Internal Medicine

## 2023-05-21 DIAGNOSIS — I10 Essential (primary) hypertension: Secondary | ICD-10-CM

## 2023-05-23 NOTE — Telephone Encounter (Signed)
 Requested Prescriptions  Pending Prescriptions Disp Refills   olmesartan-hydrochlorothiazide (BENICAR HCT) 40-25 MG tablet [Pharmacy Med Name: OLMESARTAN/HCTZ TABS 40/25MG ] 90 tablet 3    Sig: TAKE 1 TABLET DAILY     Cardiovascular: ARB + Diuretic Combos Failed - 05/23/2023  9:46 AM      Failed - Na in normal range and within 180 days    Sodium  Date Value Ref Range Status  05/13/2023 134 (L) 135 - 145 mmol/L Final  03/13/2023 141 134 - 144 mmol/L Final  01/22/2012 136 136 - 145 mmol/L Final         Failed - Cr in normal range and within 180 days    Creatinine  Date Value Ref Range Status  05/13/2023 1.32 (H) 0.44 - 1.00 mg/dL Final  52/84/1324 4.01 0.60 - 1.30 mg/dL Final         Failed - Last BP in normal range    BP Readings from Last 1 Encounters:  05/13/23 (!) 145/67         Failed - Valid encounter within last 6 months    Recent Outpatient Visits   None     Future Appointments             In 1 month Megan Milan, MD [facility] at [facility]            Passed - K in normal range and within 180 days    Potassium  Date Value Ref Range Status  05/13/2023 4.3 3.5 - 5.1 mmol/L Final  01/22/2012 4.0 3.5 - 5.1 mmol/L Final         Passed - eGFR is 10 or above and within 180 days    EGFR (African American)  Date Value Ref Range Status  10/13/2012 >60  Final   GFR calc Af Amer  Date Value Ref Range Status  10/06/2019 79 >59 mL/min/1.73 Final    Comment:    **[facility] currently reports eGFR in compliance with the current**   recommendations of the [employer]. [facility] will   update reporting as new guidelines are published from the NKF-ASN   Task force.    EGFR (Non-African Amer.)  Date Value Ref Range Status  10/13/2012 >60  Final    Comment:    eGFR values <86mL/min/1.73 m2 may be an indication of chronic kidney disease (CKD). Calculated eGFR is useful in patients with stable renal  function. The eGFR calculation will not be reliable in acutely ill patients when serum creatinine is changing rapidly. It is not useful in  patients on dialysis. The eGFR calculation may not be applicable to patients at the low and high extremes of body sizes, pregnant women, and vegetarians.    GFR, Estimated  Date Value Ref Range Status  05/13/2023 42 (L) >60 mL/min Final    Comment:    (NOTE) Calculated using the CKD-EPI Creatinine Equation (2021)    eGFR  Date Value Ref Range Status  03/13/2023 42 (L) >59 mL/min/1.73 Final         Passed - Patient is not pregnant

## 2023-06-03 ENCOUNTER — Other Ambulatory Visit: Admitting: Internal Medicine

## 2023-06-03 DIAGNOSIS — M170 Bilateral primary osteoarthritis of knee: Secondary | ICD-10-CM

## 2023-06-04 NOTE — Telephone Encounter (Signed)
 Last OV 03/13/23 within protocol.  Requested Prescriptions  Pending Prescriptions Disp Refills   celecoxib (CELEBREX) 200 MG capsule [Pharmacy Med Name: CELECOXIB CAPS 200MG ] 90 capsule 0    Sig: TAKE 1 CAPSULE DAILY     Analgesics:  COX2 Inhibitors Failed - 06/04/2023  8:49 AM      Failed - Manual Review: Labs are only required if the patient has taken medication for more than 8 weeks.      Failed - Cr in normal range and within 360 days    Creatinine  Date Value Ref Range Status  05/13/2023 1.32 (H) 0.44 - 1.00 mg/dL Final  19/14/7829 5.62 0.60 - 1.30 mg/dL Final         Failed - Valid encounter within last 12 months    Recent Outpatient Visits   None     Future Appointments             In 1 month Megan Shields, Megan Cowden, MD [facility] Medicine at [facility]            Passed - HGB in normal range and within 360 days    Hemoglobin  Date Value Ref Range Status  05/13/2023 13.5 12.0 - 15.0 g/dL Final  13/09/6576 46.9 11.1 - 15.9 g/dL Final         Passed - HCT in normal range and within 360 days    HCT  Date Value Ref Range Status  05/13/2023 42.8 36.0 - 46.0 % Final   Hematocrit  Date Value Ref Range Status  10/06/2019 46.0 34.0 - 46.6 % Final         Passed - AST in normal range and within 360 days    AST  Date Value Ref Range Status  05/13/2023 23 15 - 41 U/L Final         Passed - ALT in normal range and within 360 days    ALT  Date Value Ref Range Status  05/13/2023 15 0 - 44 U/L Final         Passed - eGFR is 30 or above and within 360 days    EGFR (African American)  Date Value Ref Range Status  10/13/2012 >60  Final   GFR calc Af Amer  Date Value Ref Range Status  10/06/2019 79 >59 mL/min/1.73 Final    Comment:    **[facility] currently reports eGFR in compliance with the current**   recommendations of the [employer]. [facility] will   update reporting as new guidelines are published from the NKF-ASN    Task force.    EGFR (Non-African Amer.)  Date Value Ref Range Status  10/13/2012 >60  Final    Comment:    eGFR values <50mL/min/1.73 m2 may be an indication of chronic kidney disease (CKD). Calculated eGFR is useful in patients with stable renal function. The eGFR calculation will not be reliable in acutely ill patients when serum creatinine is changing rapidly. It is not useful in  patients on dialysis. The eGFR calculation may not be applicable to patients at the low and high extremes of body sizes, pregnant women, and vegetarians.    GFR, Estimated  Date Value Ref Range Status  05/13/2023 42 (L) >60 mL/min Final    Comment:    (NOTE) Calculated using the CKD-EPI Creatinine Equation (2021)    eGFR  Date Value Ref Range Status  03/13/2023 42 (L) >59 mL/min/1.73 Final  Passed - Patient is not pregnant

## 2023-06-12 ENCOUNTER — Ambulatory Visit (INDEPENDENT_AMBULATORY_CARE_PROVIDER_SITE_OTHER): Admitting: Emergency Medicine

## 2023-06-12 VITALS — Ht 64.0 in | Wt 283.0 lb

## 2023-06-12 DIAGNOSIS — Z0000 Encounter for general adult medical examination without abnormal findings: Secondary | ICD-10-CM

## 2023-06-12 NOTE — Progress Notes (Signed)
 Subjective:   Megan Shields is a 78 y.o. who presents for a Medicare Wellness preventive visit.  Visit Complete: Virtual I connected with  Judie Hollick on 06/12/23 by a audio enabled telemedicine application and verified that I am speaking with the correct person using two identifiers.  Patient Location: Home  Provider Location: Home Office  I discussed the limitations of evaluation and management by telemedicine. The patient expressed understanding and agreed to proceed.  Vital Signs: Because this visit was a virtual/telehealth visit, some criteria may be missing or patient reported. Any vitals not documented were not able to be obtained and vitals that have been documented are patient reported.  VideoDeclined- This patient declined Librarian, academic. Therefore the visit was completed with audio only.  Persons Participating in Visit: Patient.  AWV Questionnaire: No: Patient Medicare AWV questionnaire was not completed prior to this visit.  Cardiac Risk Factors include: advanced age (>6men, >63 women);obesity (BMI >30kg/m2);diabetes mellitus;hypertension;dyslipidemia     Objective:    Today's Vitals   06/12/23 0837  Weight: 283 lb (128.4 kg)  Height: 5\' 4"  (1.626 m)   Body mass index is 48.58 kg/m.     06/12/2023    8:49 AM 05/13/2023   10:23 AM 02/12/2023   10:03 AM 11/13/2022    9:13 AM 08/05/2022    9:53 AM 06/12/2022    6:40 AM 06/05/2022   10:50 AM  Advanced Directives  Does Patient Have a Medical Advance Directive? Yes Yes Yes Yes Yes Yes Yes  Type of Estate agent of [place];Living will Healthcare Power of [place];Living will Healthcare Power of [place];Living will Healthcare Power of [place];Living will Healthcare Power of [place];Living will  Healthcare Power of [place];Living will  Does patient want to make changes to medical advance directive?      No - Guardian declined   Copy of Healthcare Power of  Attorney in Chart? No - copy requested Yes - validated most recent copy scanned in chart (See row information)    No - copy requested No - copy requested    Current Medications (verified) Outpatient Encounter Medications as of 06/12/2023  Medication Sig   acetaminophen (TYLENOL) 500 MG tablet Take 1,000 mg by mouth at bedtime as needed for moderate pain.   celecoxib (CELEBREX) 200 MG capsule TAKE 1 CAPSULE DAILY   cholecalciferol (VITAMIN D3) 25 MCG (1000 UNIT) tablet Take 1,000 Units by mouth daily.   FREESTYLE LITE test strip TEST BLOOD SUGAR TWICE A DAY   Lancets (FREESTYLE) lancets TEST BLOOD SUGAR TWICE A DAY   MOUNJARO 7.5 MG/0.5ML Pen INJECT 7.5 MG UNDER THE SKIN WEEKLY   olmesartan-hydrochlorothiazide (BENICAR HCT) 40-25 MG tablet TAKE 1 TABLET DAILY   ondansetron (ZOFRAN) 8 MG tablet One pill every 8 hours as needed for nausea/vomitting.   OXYGEN Inhale into the lungs at bedtime.   prochlorperazine (COMPAZINE) 10 MG tablet Take 1 tablet (10 mg total) by mouth every 6 (six) hours as needed for nausea or vomiting.   Pumpkin Seed-Soy Germ (AZO BLADDER CONTROL/GO-LESS PO) Take by mouth.   TAGRISSO 40 MG tablet TAKE 1 TABLET DAILY.   cyclobenzaprine (FLEXERIL) 10 MG tablet Take 1 tablet (10 mg total) by mouth at bedtime. (Patient not taking: Reported on 06/12/2023)   No facility-administered encounter medications on file as of 06/12/2023.    Allergies (verified) Pneumococcal vaccines, Wound dressing adhesive, Amoxicillin, Codeine, and Penicillins   History: Past Medical History:  Diagnosis Date   Anemia    vitamin  d deficiency   Arthritis    Chronic cough    Depression    Diabetes mellitus without complication (HCC)    Dyspnea    Hypertension    ILD (interstitial lung disease) (HCC)    Obesity    BMI 49.44   Pneumonia    Pneumonia due to 2019 novel coronavirus 2022   community acquired also. 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   Rosacea    Shoulder contusion 11/22/2015   Vertigo    none since 3/23   Past Surgical History:  Procedure Laterality Date   BLEPHAROPLASTY Bilateral 12/19/2017   CARPAL TUNNEL RELEASE Bilateral 1985   CATARACT EXTRACTION W/PHACO Right 05/22/2022   Procedure: CATARACT EXTRACTION PHACO AND INTRAOCULAR LENS PLACEMENT (IOC) RIGHT DIABETIC;  Surgeon: Annell Kidney, MD;  Location: [facility];  Service: Ophthalmology;  Laterality: Right;  3.79 0:41.1   CATARACT EXTRACTION W/PHACO Left 06/12/2022   Procedure: CATARACT EXTRACTION PHACO AND INTRAOCULAR LENS PLACEMENT (IOC) LEFT DIABETIC  6.12  00:46.2;  Surgeon: Annell Kidney, MD;  Location: [facility];  Service: Ophthalmology;  Laterality: Left;  Diabetic   CHOLECYSTECTOMY     COLONOSCOPY WITH PROPOFOL N/A 03/07/2017   Procedure: COLONOSCOPY WITH PROPOFOL;  Surgeon: Luke Salaam, MD;  Location: [facility];  Service: Gastroenterology;  Laterality: N/A;   DILATION AND CURETTAGE OF UTERUS  12/2011   benign endometrial polyp   HALLUX VALGUS AUSTIN Right 09/02/2014   Procedure: HALLUX VALGUS AUSTIN;  Surgeon: Anell Baptist, DPM;  Location: [facility] OR[facility];  Service: Podiatry;  Laterality: Right;   HERNIA REPAIR     JOINT REPLACEMENT Bilateral    TKR   LAPAROSCOPIC GASTRIC BANDING  2010   Lap Band   LAPAROSCOPIC TOTAL HYSTERECTOMY  12/23/2019   lymph node removed Right    TOTAL KNEE ARTHROPLASTY Bilateral 2003, 2004   TOTAL KNEE ARTHROPLASTY Bilateral    VENTRAL HERNIA REPAIR  2008   Family History  Problem Relation Age of Onset   Breast cancer Mother 56   Liver disease Brother        died 67   Breast cancer Paternal Aunt        3 pat aunts   Social History   Socioeconomic History   Marital status: Married    Spouse name: Hewitt Lou   Number of children: 2   Years of education: Not on file   Highest education level: Not on file  Occupational History    Occupation: Therapist, music    Comment: RETIRED  Tobacco Use   Smoking status: Never    Passive exposure: Never   Smokeless tobacco: Never  Vaping Use   Vaping status: Never Used  Substance and Sexual Activity   Alcohol use: No    Alcohol/week: 0.0 standard drinks of alcohol   Drug use: No   Sexual activity: Not Currently  Other Topics Concern   Not on file  Social History Narrative   Never smoked; no alcohol. Lives in [place] with husband . Home maker.    Daughter lives next door. 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   Social Drivers of Health   Financial Resource Strain: Low Risk  (06/12/2023)   Overall Financial Resource Strain (CARDIA)    Difficulty of Paying Living Expenses: Not hard at all  Food Insecurity: No Food Insecurity (06/12/2023)   Hunger Vital Sign    Worried About Running  Out of Food in the Last Year: Never true    Ran Out of Food in the Last Year: Never true  Transportation Needs: No Transportation Needs (06/12/2023)   PRAPARE - Administrator, Civil Service (Medical): No    Lack of Transportation (Non-Medical): No  Physical Activity: Inactive (06/12/2023)   Exercise Vital Sign    Days of Exercise per Week: 0 days    Minutes of Exercise per Session: 0 min  Stress: No Stress Concern Present (06/12/2023)   [employer] of Occupational Health - Occupational Stress Questionnaire    Feeling of Stress : Not at all  Social Connections: Moderately Isolated (06/12/2023)   Social Connection and Isolation Panel [NHANES]    Frequency of Communication with Friends and Family: More than three times a week    Frequency of Social Gatherings with Friends and Family: More than three times a week    Attends Religious Services: Never    Database administrator or Organizations: No    Attends Engineer, structural: Never    Marital Status: Married    Tobacco Counseling Counseling given: Not Answered    Clinical Intake:  Pre-visit  preparation completed: Yes  Pain : No/denies pain     BMI - recorded: 48.58 Nutritional Status: BMI > 30  Obese Nutritional Risks: None Diabetes: Yes CBG done?: No Did pt. bring in CBG monitor from home?: No  Lab Results  Component Value Date   HGBA1C 7.0 (H) 03/13/2023   HGBA1C 6.8 (A) 10/04/2022   HGBA1C 7.9 (A) 06/04/2022     How often do you need to have someone help you when you read instructions, pamphlets, or other written materials from your doctor or pharmacy?: 1 - Never  Interpreter Needed?: No  Information entered by :: Jaunita Messier, CMA   Activities of Daily Living     06/12/2023    8:40 AM  In your present state of health, do you have any difficulty performing the following activities:  Hearing? 0  Vision? 0  Difficulty concentrating or making decisions? 0  Walking or climbing stairs? 1  Comment slow getting up stairs due to knee replacements  Dressing or bathing? 0  Doing errands, shopping? 0  Preparing Food and eating ? N  Using the Toilet? N  In the past six months, have you accidently leaked urine? N  Do you have problems with loss of bowel control? N  Managing your Medications? N  Managing your Finances? N  Housekeeping or managing your Housekeeping? N    Patient Care Team: Sheron Dixons, MD as PCP - General (Internal Medicine) Mariane Shire, MD (Dermatology) Drake Gens, RN as Oncology Nurse Navigator Gwyn Leos, MD as Consulting Physician (Oncology) Annell Kidney, MD as Referring Physician (Ophthalmology)  Indicate any recent Medical Services you may have received from other than [facility] providers in the past year (date may be approximate).     Assessment:   This is a routine wellness examination for [place].  Hearing/Vision screen Hearing Screening - Comments:: Denies hearing loss Vision Screening - Comments:: Gets DM eye exams, Dr Dellia Ferguson, [facility] [place]   Goals Addressed             This Visit's  Progress    Patient Stated       Lose weight       Depression Screen     06/12/2023    8:47 AM 03/13/2023    8:29 AM 10/04/2022    8:37 AM  06/04/2022    3:19 PM 01/30/2022   11:16 AM 06/08/2021    9:35 AM 02/07/2021    9:42 AM  PHQ 2/9 Scores  PHQ - 2 Score 0 0 0 1 1 0 0  PHQ- 9 Score 3 3 3 5 8 5 6     Fall Risk     06/12/2023    8:51 AM 03/13/2023    8:29 AM 10/04/2022    8:37 AM 06/04/2022    3:19 PM 01/30/2022   11:17 AM  Fall Risk   Falls in the past year? 0 0 0 0 0  Number falls in past yr: 0 0 0 0 0  Injury with Fall? 0 0 0 0 0  Risk for fall due to : No Fall Risks No Fall Risks No Fall Risks No Fall Risks No Fall Risks  Follow up Falls prevention discussed;Falls evaluation completed Falls evaluation completed Falls evaluation completed Falls evaluation completed Falls evaluation completed    MEDICARE RISK AT HOME:  Medicare Risk at Home Any stairs in or around the home?: No If so, are there any without handrails?: No Home free of loose throw rugs in walkways, pet beds, electrical cords, etc?: Yes Adequate lighting in your home to reduce risk of falls?: Yes Life alert?: No Use of a cane, walker or w/c?: No Grab bars in the bathroom?: Yes Shower chair or bench in shower?: Yes Elevated toilet seat or a handicapped toilet?: Yes  TIMED UP AND GO:  Was the test performed?  No  Cognitive Function: 6CIT completed        06/12/2023    8:51 AM 06/04/2022    4:24 PM 11/22/2019    8:30 AM 10/06/2018    8:56 AM 11/22/2016    8:29 AM  6CIT Screen  What Year? 0 points 0 points 0 points 0 points 0 points  What month? 0 points 0 points 0 points 0 points 0 points  What time? 0 points 0 points 0 points 0 points 0 points  Count back from 20 0 points 0 points 0 points 0 points 0 points  Months in reverse 0 points 0 points 0 points 0 points 2 points  Repeat phrase 0 points 4 points 0 points 4 points 2 points  Total Score 0 points 4 points 0 points 4 points 4 points     Immunizations Immunization History  Administered Date(s) Administered   Fluad Quad(high Dose 65+) 11/26/2021   PFIZER(Purple Top)SARS-COV-2 Vaccination 03/26/2019, 04/16/2019   Pneumococcal Conjugate-13 03/11/2014   Pneumococcal Polysaccharide-23 09/04/2012   Zoster, Live 03/30/2013    Screening Tests Health Maintenance  Topic Date Due   DTaP/Tdap/Td (1 - Tdap) Never done   COVID-19 Vaccine (3 - Pfizer risk series) 05/14/2019   DEXA SCAN  03/12/2024 (Originally 10/29/2012)   Zoster Vaccines- Shingrix (1 of 2) 10/09/2025 (Originally 10/30/1964)   HEMOGLOBIN A1C  09/10/2023   INFLUENZA VACCINE  09/26/2023   Diabetic kidney evaluation - Urine ACR  10/04/2023   FOOT EXAM  10/04/2023   OPHTHALMOLOGY EXAM  01/10/2024   Diabetic kidney evaluation - eGFR measurement  05/12/2024   Medicare Annual Wellness (AWV)  06/11/2024   Hepatitis C Screening  Completed   HPV VACCINES  Aged Out   Meningococcal B Vaccine  Aged Out   Pneumonia Vaccine 87+ Years old  Discontinued   MAMMOGRAM  Discontinued    Health Maintenance  Health Maintenance Due  Topic Date Due   DTaP/Tdap/Td (1 - Tdap) Never done  COVID-19 Vaccine (3 - Pfizer risk series) 05/14/2019   Health Maintenance Items Addressed: See Nurse Notes  Additional Screening:  Vision Screening: Recommended annual ophthalmology exams for early detection of glaucoma and other disorders of the eye.  Dental Screening: Recommended annual dental exams for proper oral hygiene  Community Resource Referral / Chronic Care Management: CRR required this visit?  No   CCM required this visit?  No     Plan:     I have personally reviewed and noted the following in the patient's chart:   Medical and social history Use of alcohol, tobacco or illicit drugs  Current medications and supplements including opioid prescriptions. Patient is not currently taking opioid prescriptions. Functional ability and status Nutritional status Physical  activity Advanced directives List of other physicians Hospitalizations, surgeries, and ER visits in previous 12 months Vitals Screenings to include cognitive, depression, and falls Referrals and appointments  In addition, I have reviewed and discussed with patient certain preventive protocols, quality metrics, and best practice recommendations. A written personalized care plan for preventive services as well as general preventive health recommendations were provided to patient.     Jaunita Messier, CMA   06/12/2023   After Visit Summary: (MyChart) Due to this being a telephonic visit, the after visit summary with patients personalized plan was offered to patient via MyChart   Notes:  Declined DM & Nutrition education Declined Covid, shingles and Tdap vaccines Declined DEXA and MMG Colonoscopy no longer recommended due to age

## 2023-06-12 NOTE — Patient Instructions (Addendum)
 Ms. Seder , Thank you for taking time to come for your Medicare Wellness Visit. I appreciate your ongoing commitment to your health goals. Please review the following plan we discussed and let me know if I can assist you in the future.   Referrals/Orders/Follow-Ups/Clinician Recommendations: Keep up the good work!  This is a list of the screening recommended for you and due dates:  Health Maintenance  Topic Date Due   DTaP/Tdap/Td vaccine (1 - Tdap) Never done   COVID-19 Vaccine (3 - Pfizer risk series) 05/14/2019   DEXA scan (bone density measurement)  03/12/2024*   Zoster (Shingles) Vaccine (1 of 2) 10/09/2025*   Hemoglobin A1C  09/10/2023   Flu Shot  09/26/2023   Yearly kidney health urinalysis for diabetes  10/04/2023   Complete foot exam   10/04/2023   Eye exam for diabetics  01/10/2024   Yearly kidney function blood test for diabetes  05/12/2024   Medicare Annual Wellness Visit  06/11/2024   Hepatitis C Screening  Completed   HPV Vaccine  Aged Out   Meningitis B Vaccine  Aged Out   Pneumonia Vaccine  Discontinued   Mammogram  Discontinued  *Topic was postponed. The date shown is not the original due date.    Advanced directives: (Copy Requested) Please bring a copy of your health care power of attorney and living will to the office to be added to your chart at your convenience. You can mail to [facility] [address] [place], [place] [address] or email to [email or web address]  Next Medicare Annual Wellness Visit scheduled for next year: Yes, 06/24/24 @ 8:40am (phone visit)

## 2023-06-13 ENCOUNTER — Other Ambulatory Visit: Payer: Self-pay | Admitting: Internal Medicine

## 2023-06-13 DIAGNOSIS — C3411 Malignant neoplasm of upper lobe, right bronchus or lung: Secondary | ICD-10-CM

## 2023-07-11 ENCOUNTER — Ambulatory Visit (INDEPENDENT_AMBULATORY_CARE_PROVIDER_SITE_OTHER): Payer: Self-pay | Admitting: Internal Medicine

## 2023-07-11 ENCOUNTER — Encounter: Payer: Self-pay | Admitting: Internal Medicine

## 2023-07-11 VITALS — BP 138/82 | HR 61 | Ht 64.0 in | Wt 283.2 lb

## 2023-07-11 DIAGNOSIS — I10 Essential (primary) hypertension: Secondary | ICD-10-CM | POA: Diagnosis not present

## 2023-07-11 DIAGNOSIS — N1832 Chronic kidney disease, stage 3b: Secondary | ICD-10-CM | POA: Diagnosis not present

## 2023-07-11 DIAGNOSIS — Z7985 Long-term (current) use of injectable non-insulin antidiabetic drugs: Secondary | ICD-10-CM | POA: Diagnosis not present

## 2023-07-11 DIAGNOSIS — I4430 Unspecified atrioventricular block: Secondary | ICD-10-CM | POA: Insufficient documentation

## 2023-07-11 DIAGNOSIS — E118 Type 2 diabetes mellitus with unspecified complications: Secondary | ICD-10-CM

## 2023-07-11 DIAGNOSIS — I499 Cardiac arrhythmia, unspecified: Secondary | ICD-10-CM

## 2023-07-11 LAB — POCT GLYCOSYLATED HEMOGLOBIN (HGB A1C): Hemoglobin A1C: 6.2 % — AB (ref 4.0–5.6)

## 2023-07-11 NOTE — Assessment & Plan Note (Signed)
 Blood pressure is well controlled.  Current medications are Benicar  hydrochlorothiazide Will continue same regimen along with efforts to limit dietary sodium.

## 2023-07-11 NOTE — Assessment & Plan Note (Signed)
GFR stable at 42

## 2023-07-11 NOTE — Progress Notes (Signed)
 Date:  07/11/2023   Name:  Megan Shields   DOB:  [birth year]   MRN:  [id number]   Chief Complaint: Hypertension and Diabetes  Hypertension This is a chronic problem. The problem is controlled. Pertinent negatives include no chest pain, headaches, palpitations or shortness of breath. Past treatments include angiotensin blockers and diuretics.  Diabetes She presents for her follow-up diabetic visit. She has type 2 diabetes mellitus. Pertinent negatives for hypoglycemia include no dizziness or headaches. Pertinent negatives for diabetes include no chest pain, no fatigue and no weakness. Current diabetic treatments: Mounjaro . She is compliant with treatment all of the time. There is no change in her home blood glucose trend.    Review of Systems  Constitutional:  Negative for fatigue and unexpected weight change.  HENT:  Negative for trouble swallowing.   Eyes:  Negative for visual disturbance.  Respiratory:  Negative for cough, chest tightness, shortness of breath and wheezing.   Cardiovascular:  Negative for chest pain, palpitations and leg swelling.  Gastrointestinal:  Negative for abdominal pain, constipation and diarrhea.  Musculoskeletal:  Negative for arthralgias and myalgias.  Neurological:  Negative for dizziness, weakness, light-headedness and headaches.     Lab Results  Component Value Date   NA 134 (L) 05/13/2023   K 4.3 05/13/2023   CO2 27 05/13/2023   GLUCOSE 117 (H) 05/13/2023   BUN 23 05/13/2023   CREATININE 1.32 (H) 05/13/2023   CALCIUM  9.2 05/13/2023   EGFR 42 (L) 03/13/2023   GFRNONAA 42 (L) 05/13/2023   Lab Results  Component Value Date   CHOL 164 03/13/2023   HDL 71 03/13/2023   LDLCALC 79 03/13/2023   TRIG 76 03/13/2023   CHOLHDL 2.3 03/13/2023   Lab Results  Component Value Date   TSH 1.470 03/13/2023   Lab Results  Component Value Date   HGBA1C 6.2 (A) 07/11/2023   Lab Results  Component Value Date   WBC 3.9 (L) 05/13/2023   HGB 13.5  05/13/2023   HCT 42.8 05/13/2023   MCV 87.3 05/13/2023   PLT 180 05/13/2023   Lab Results  Component Value Date   ALT 15 05/13/2023   AST 23 05/13/2023   ALKPHOS 50 05/13/2023   BILITOT 0.6 05/13/2023   Lab Results  Component Value Date   VD25OH 34.9 03/07/2020     Patient Active Problem List   Diagnosis Date Noted   CKD stage 3b, GFR 30-44 ml/min (HCC) 07/11/2023   AVB (atrioventricular block) 07/11/2023   Obesity, morbid (HCC) 03/13/2023   Secondary and unspecified malignant neoplasm of lymph nodes of head, 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   ILD (interstitial lung disease) (HCC) 01/30/2022   Enlarged thyroid  gland 11/26/2021   Drug-induced neutropenia (HCC) 07/17/2020   Primary cancer of right upper lobe of lung (HCC) 06/30/2020   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   Vitamin D deficiency 10/06/2019   Rosacea 04/08/2019   Hyperlipidemia associated with type 2 diabetes mellitus (HCC) 04/08/2019   Vertigo 04/07/2018   Tubular adenoma of colon 03/10/2017   Tinea pedis of left foot 11/22/2016   Type II diabetes mellitus with complication (HCC) 07/06/2015   Essential (primary) hypertension 08/01/2014   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 Tears off skin. blisters  Amoxicillin Rash   Codeine     Other reaction(s): drowsiness   Penicillins Rash    Past Surgical History:  Procedure Laterality Date   BLEPHAROPLASTY Bilateral 12/19/2017   CARPAL TUNNEL RELEASE Bilateral 1985   CATARACT EXTRACTION W/PHACO Right 05/22/2022   Procedure: CATARACT EXTRACTION PHACO AND INTRAOCULAR LENS PLACEMENT (IOC) RIGHT DIABETIC;  Surgeon: Annell Kidney, MD;  Location: [facility];  Service: Ophthalmology;  Laterality: Right;   3.79 0:41.1   CATARACT EXTRACTION W/PHACO Left 06/12/2022   Procedure: CATARACT EXTRACTION PHACO AND INTRAOCULAR LENS PLACEMENT (IOC) LEFT DIABETIC  6.12  00:46.2;  Surgeon: Annell Kidney, MD;  Location: [facility];  Service: Ophthalmology;  Laterality: Left;  Diabetic   CHOLECYSTECTOMY     COLONOSCOPY WITH PROPOFOL  N/A 03/07/2017   Procedure: COLONOSCOPY WITH PROPOFOL ;  Surgeon: Luke Salaam, MD;  Location: [facility];  Service: Gastroenterology;  Laterality: N/A;   DILATION AND CURETTAGE OF UTERUS  12/2011   benign endometrial polyp   HALLUX VALGUS AUSTIN Right 09/02/2014   Procedure: HALLUX VALGUS AUSTIN;  Surgeon: Anell Baptist, DPM;  Location: [facility];  Service: Podiatry;  Laterality: Right;   HERNIA REPAIR     JOINT REPLACEMENT Bilateral    TKR   LAPAROSCOPIC GASTRIC BANDING  2010   Lap Band   LAPAROSCOPIC TOTAL HYSTERECTOMY  12/23/2019   lymph node removed Right    TOTAL KNEE ARTHROPLASTY Bilateral 2003, 2004   TOTAL KNEE ARTHROPLASTY Bilateral    VENTRAL HERNIA REPAIR  2008    Social History   Tobacco Use   Smoking status: Never    Passive exposure: Never   Smokeless tobacco: Never  Vaping Use   Vaping status: Never Used  Substance Use Topics   Alcohol use: No    Alcohol/week: 0.0 standard drinks of alcohol   Drug use: No     Medication list has been reviewed and updated.  Current Meds  Medication Sig   acetaminophen  (TYLENOL ) 500 MG tablet Take 1,000 mg by mouth at bedtime as needed for moderate pain.   celecoxib  (CELEBREX ) 200 MG capsule TAKE 1 CAPSULE DAILY   cholecalciferol (VITAMIN D3) 25 MCG (1000 UNIT) tablet Take 1,000 Units by mouth daily.   cyclobenzaprine  (FLEXERIL ) 10 MG tablet Take 1 tablet (10 mg total) by mouth at bedtime. (Patient taking differently: Take 10 mg by mouth as needed.)   FREESTYLE LITE test strip TEST BLOOD SUGAR TWICE A DAY   Lancets (FREESTYLE) lancets TEST BLOOD SUGAR TWICE A DAY   MOUNJARO  7.5 MG/0.5ML Pen  INJECT 7.5 MG UNDER THE SKIN WEEKLY   olmesartan -hydrochlorothiazide (BENICAR  HCT) 40-25 MG tablet TAKE 1 TABLET DAILY   ondansetron  (ZOFRAN ) 8 MG tablet One pill every 8 hours as needed for nausea/vomitting.   OXYGEN  Inhale into the lungs at bedtime.   prochlorperazine  (COMPAZINE ) 10 MG tablet Take 1 tablet (10 mg total) by mouth every 6 (six) hours as needed for nausea or vomiting.   Pumpkin Seed-Soy Germ (AZO BLADDER CONTROL/GO-LESS PO) Take by mouth.   TAGRISSO  40 MG tablet TAKE 1 TABLET DAILY.       07/11/2023    9:46 AM 03/13/2023    8:29 AM 10/04/2022    8:37 AM 06/04/2022    3:19 PM  GAD 7 : Generalized Anxiety Score  Nervous, Anxious, on Edge 0 0 0 0  Control/stop worrying 0 0 0 0  Worry too much - different things 0 0 0 0  Trouble relaxing 0 0 0 0  Restless 0 0  0 0  Easily annoyed or irritable 0 0 0 0  Afraid - awful might happen 0 0 0 0  Total GAD 7 Score 0 0 0 0  Anxiety Difficulty Not difficult at all Not difficult at all Not difficult at all Not difficult at all       07/11/2023    9:45 AM 06/12/2023    8:47 AM 03/13/2023    8:29 AM  Depression screen PHQ 2/9  Decreased Interest 0 0 0  Down, Depressed, Hopeless 0 0 0  PHQ - 2 Score 0 0 0  Altered sleeping 0 0 0  Tired, decreased energy 3 3 3   Change in appetite 0 0 0  Feeling bad or failure about yourself  0 0 0  Trouble concentrating 0 0 0  Moving slowly or fidgety/restless 0 0 0  Suicidal thoughts 0 0 0  PHQ-9 Score 3 3 3   Difficult doing work/chores Not difficult at all Not difficult at all Not difficult at all    BP Readings from Last 3 Encounters:  07/11/23 138/82  05/13/23 (!) 145/67  03/13/23 128/70    Physical Exam Vitals and nursing note reviewed.  Constitutional:      General: She is not in acute distress.    Appearance: She is well-developed.  HENT:     Head: Normocephalic and atraumatic.  Neck:     Vascular: No carotid bruit.  Cardiovascular:     Rate and Rhythm: Normal rate. Rhythm  irregular.     Heart sounds: No murmur heard. Pulmonary:     Effort: Pulmonary effort is normal. No respiratory distress.     Breath sounds: No wheezing or rhonchi.  Musculoskeletal:     Cervical back: Normal range of motion.     Right lower leg: No edema.     Left lower leg: No edema.  Lymphadenopathy:     Cervical: No cervical adenopathy.  Skin:    General: Skin is warm and dry.     Capillary Refill: Capillary refill takes less than 2 seconds.     Findings: No rash.  Neurological:     General: No focal deficit present.     Mental Status: She is alert and oriented to person, place, and time.  Psychiatric:        Mood and Affect: Mood normal.        Behavior: Behavior normal.     Wt Readings from Last 3 Encounters:  07/11/23 283 lb 4 oz (128.5 kg)  06/12/23 283 lb (128.4 kg)  05/13/23 284 lb (128.8 kg)    BP 138/82   Pulse 61   Ht 5\' 4"  (1.626 m)   Wt 283 lb 4 oz (128.5 kg)   SpO2 95%   BMI 48.62 kg/m   Assessment and Plan:  Problem List Items Addressed This Visit       Unprioritized   Essential (primary) hypertension (Chronic)   Blood pressure is well controlled.  Current medications are Benicar  hydrochlorothiazide Will continue same regimen along with efforts to limit dietary sodium.       Type II diabetes mellitus with complication (HCC) - Primary (Chronic)   Blood sugars have been stable.  No recent hypoglycemic events requiring assistance. Currently medications are Mounjaro . Lab Results  Component Value Date   HGBA1C 7.0 (H) 03/13/2023   Last visit no changes were made. A1C today = 6.2.  will continue same dose of Mounjaro        Relevant Orders   POCT glycosylated  hemoglobin (Hb A1C) (Completed)   CKD stage 3b, GFR 30-44 ml/min (HCC)   GFR stable at 42      AVB (atrioventricular block)   New finding on EKG today done for irregular HR on exam EKG also shows frequent PVCs and possible old infarct Will refer to Cardiology      Relevant  Orders   Ambulatory referral to Cardiology   Other Visit Diagnoses       Irregular heartbeat       Relevant Orders   EKG 12-Lead (Completed)   Ambulatory referral to Cardiology     Long-term current use of injectable noninsulin antidiabetic medication           Return in about 4 months (around 11/11/2023) for DM, HTN.    Sheron Dixons, MD [facility] [place]

## 2023-07-11 NOTE — Assessment & Plan Note (Signed)
 New finding on EKG today done for irregular HR on exam EKG also shows frequent PVCs and possible old infarct Will refer to Cardiology

## 2023-07-11 NOTE — Assessment & Plan Note (Addendum)
 Blood sugars have been stable.  No recent hypoglycemic events requiring assistance. Currently medications are Mounjaro . Lab Results  Component Value Date   HGBA1C 7.0 (H) 03/13/2023   Last visit no changes were made. A1C today = 6.2.  will continue same dose of Mounjaro 

## 2023-07-16 ENCOUNTER — Encounter: Payer: Self-pay | Admitting: Cardiology

## 2023-07-16 ENCOUNTER — Ambulatory Visit: Attending: Cardiology | Admitting: Cardiology

## 2023-07-16 VITALS — BP 160/90 | HR 65 | Ht 64.0 in | Wt 283.4 lb

## 2023-07-16 DIAGNOSIS — R9431 Abnormal electrocardiogram [ECG] [EKG]: Secondary | ICD-10-CM | POA: Diagnosis not present

## 2023-07-16 DIAGNOSIS — R0600 Dyspnea, unspecified: Secondary | ICD-10-CM | POA: Diagnosis not present

## 2023-07-16 DIAGNOSIS — I10 Essential (primary) hypertension: Secondary | ICD-10-CM | POA: Insufficient documentation

## 2023-07-16 NOTE — Patient Instructions (Signed)
 Medication Instructions:  Your Physician recommend you continue on your current medication as directed.    *If you need a refill on your cardiac medications before your next appointment, please call your pharmacy*  Lab Work: No labs ordered today  If you have labs (blood work) drawn today and your tests are completely normal, you will receive your results only by: MyChart Message (if you have MyChart) OR A paper copy in the mail If you have any lab test that is abnormal or we need to change your treatment, we will call you to review the results.  Testing/Procedures: Your physician has requested that you have an echocardiogram. Echocardiography is a painless test that uses sound waves to create images of your heart. It provides your doctor with information about the size and shape of your heart and how well your heart's chambers and valves are working.   You may receive an ultrasound enhancing agent through an IV if needed to better visualize your heart during the echo. This procedure takes approximately one hour.  There are no restrictions for this procedure.  This will take place at [address] [facility] [address], [place] [address]  Please note: We ask at that you not bring children with you during ultrasound (echo/ vascular) testing. Due to room size and safety concerns, children are not allowed in the ultrasound rooms during exams. Our front office staff cannot provide observation of children in our lobby area while testing is being conducted. An adult accompanying a patient to their appointment will only be allowed in the ultrasound room at the discretion of the ultrasound technician under special circumstances. We apologize for any inconvenience.   Follow-Up: At [facility], you and your health needs are our priority.  As part of our continuing mission to provide you with exceptional heart care, our providers are all part of one team.  This team includes your  primary Cardiologist (physician) and Advanced Practice Providers or APPs (Physician Assistants and Nurse Practitioners) who all work together to provide you with the care you need, when you need it.  Your next appointment:   3 month(s)  Provider:   You may see Dr. Junnie Olives or one of the following Advanced Practice Providers on your designated Care Team:   Laneta Pintos, NP Gildardo Labrador, PA-C Varney Gentleman, PA-C Cadence [place], PA-C Ronald Cockayne, NP Morey Ar, NP    We recommend signing up for the patient portal called "MyChart".  Sign up information is provided on this After Visit Summary.  MyChart is used to connect with patients for Virtual Visits (Telemedicine).  Patients are able to view lab/test results, encounter notes, upcoming appointments, etc.  Non-urgent messages can be sent to your provider as well.   To learn more about what you can do with MyChart, go to [email or web address].

## 2023-07-16 NOTE — Progress Notes (Signed)
 Cardiology Office Note:    Date:  07/16/2023   ID:  Megan Shields, DOB Nov 11, 1945, MRN [id number]  PCP:  Sheron Dixons, MD   [facility] Providers Cardiologist:  None     Referring MD: Sheron Dixons, MD   Chief Complaint  Patient presents with   New Patient (Initial Visit)    Ref by Dr. Gala Jubilee for abnormal EKG & PVC's. Patient c/o shortness of breath with over exertion; patient has lung cancer and is currently taking a chemo pill (Tagrisso ).     History of Present Illness:    Megan Shields is a 78 y.o. female with a hx of hypertension, diabetes, morbid obesity, lung cancer on chemotherapy who presents with shortness of breath and abnormal ECG.  Patient also primary care physician last week, EKG obtained and noted to be abnormal suggesting possible infarct.  She denies chest pain.  Endorses shortness of breath when she overexerts herself.  Compliant with medications as prescribed.  BP usually controlled at home.  Currently takes a chemotherapy pill for lung cancer.  Denies ever smoking.  Past Medical History:  Diagnosis Date   Anemia    vitamin d deficiency   Arthritis    Chronic cough    Depression    Diabetes mellitus without complication (HCC)    Dyspnea    Hypertension    ILD (interstitial lung disease) (HCC)    Obesity    BMI 49.44   Pneumonia    Pneumonia due to 2019 novel coronavirus 2022   community acquired also. covid positive May 2022   PONV (postoperative nausea and vomiting)    Primary cancer of right upper lobe of lung (HCC) 06/30/2020   Rosacea    Shoulder contusion 11/22/2015   Vertigo    none since 3/23    Past Surgical History:  Procedure Laterality Date   BLEPHAROPLASTY Bilateral 12/19/2017   CARPAL TUNNEL RELEASE Bilateral 1985   CATARACT EXTRACTION W/PHACO Right 05/22/2022   Procedure: CATARACT EXTRACTION PHACO AND INTRAOCULAR LENS PLACEMENT (IOC) RIGHT DIABETIC;  Surgeon: Annell Kidney, MD;  Location: [facility];  Service: Ophthalmology;  Laterality: Right;  3.79 0:41.1   CATARACT EXTRACTION W/PHACO Left 06/12/2022   Procedure: CATARACT EXTRACTION PHACO AND INTRAOCULAR LENS PLACEMENT (IOC) LEFT DIABETIC  6.12  00:46.2;  Surgeon: Annell Kidney, MD;  Location: [facility];  Service: Ophthalmology;  Laterality: Left;  Diabetic   CHOLECYSTECTOMY     COLONOSCOPY WITH PROPOFOL  N/A 03/07/2017   Procedure: COLONOSCOPY WITH PROPOFOL ;  Surgeon: Luke Salaam, MD;  Location: [facility] ENDOSCOPY;  Service: Gastroenterology;  Laterality: N/A;   DILATION AND CURETTAGE OF UTERUS  12/2011   benign endometrial polyp   HALLUX VALGUS AUSTIN Right 09/02/2014   Procedure: HALLUX VALGUS AUSTIN;  Surgeon: Anell Baptist, DPM;  Location: [facility] ORS;  Service: Podiatry;  Laterality: Right;   HERNIA REPAIR     JOINT REPLACEMENT Bilateral    TKR   LAPAROSCOPIC GASTRIC BANDING  2010   Lap Band   LAPAROSCOPIC TOTAL HYSTERECTOMY  12/23/2019   lymph node removed Right    TOTAL KNEE ARTHROPLASTY Bilateral 2003, 2004   TOTAL KNEE ARTHROPLASTY Bilateral    VENTRAL HERNIA REPAIR  2008    Current Medications: Current Meds  Medication Sig   acetaminophen  (TYLENOL ) 500 MG tablet Take 1,000 mg by mouth at bedtime as needed for moderate pain.   celecoxib  (CELEBREX ) 200 MG capsule TAKE 1 CAPSULE DAILY   cholecalciferol (VITAMIN D3) 25 MCG (1000 UNIT) tablet Take  1,000 Units by mouth daily.   cyclobenzaprine  (FLEXERIL ) 10 MG tablet Take 1 tablet (10 mg total) by mouth at bedtime. (Patient taking differently: Take 10 mg by mouth as needed.)   FREESTYLE LITE test strip TEST BLOOD SUGAR TWICE A DAY   Lancets (FREESTYLE) lancets TEST BLOOD SUGAR TWICE A DAY   MOUNJARO  7.5 MG/0.5ML Pen INJECT 7.5 MG UNDER THE SKIN WEEKLY   olmesartan -hydrochlorothiazide (BENICAR  HCT) 40-25 MG tablet TAKE 1 TABLET DAILY   ondansetron  (ZOFRAN ) 8 MG tablet One pill every 8 hours as needed for nausea/vomitting.   OXYGEN  Inhale into the lungs at  bedtime.   prochlorperazine  (COMPAZINE ) 10 MG tablet Take 1 tablet (10 mg total) by mouth every 6 (six) hours as needed for nausea or vomiting.   Pumpkin Seed-Soy Germ (AZO BLADDER CONTROL/GO-LESS PO) Take by mouth.   TAGRISSO  40 MG tablet TAKE 1 TABLET DAILY.     Allergies:   Pneumococcal vaccines, Wound dressing adhesive, Amoxicillin, Codeine, and Penicillins   Social History   Socioeconomic History   Marital status: Married    Spouse name: Hewitt Lou   Number of children: 2   Years of education: Not on file   Highest education level: Not on file  Occupational History   Occupation: Therapist, music    Comment: RETIRED  Tobacco Use   Smoking status: Never    Passive exposure: Never   Smokeless tobacco: Never  Vaping Use   Vaping status: Never Used  Substance and Sexual Activity   Alcohol use: No    Alcohol/week: 0.0 standard drinks of alcohol   Drug use: No   Sexual activity: Not Currently  Other Topics Concern   Not on file  Social History Narrative   Never smoked; no alcohol. Lives in [place] with husband . Home maker.    Daughter lives next door. 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   Social Drivers of Health   Financial Resource Strain: Low Risk  (06/12/2023)   Overall Financial Resource Strain (CARDIA)    Difficulty of Paying Living Expenses: Not hard at all  Food Insecurity: No Food Insecurity (06/12/2023)   Hunger Vital Sign    Worried About Running Out of Food in the Last Year: Never true    Ran Out of Food in the Last Year: Never true  Transportation Needs: No Transportation Needs (06/12/2023)   PRAPARE - Administrator, Civil Service (Medical): No    Lack of Transportation (Non-Medical): No  Physical Activity: Inactive (06/12/2023)   Exercise Vital Sign    Days of Exercise per Week: 0 days    Minutes of Exercise per Session: 0 min  Stress: No Stress Concern Present (06/12/2023)   [employer] of Occupational Health - Occupational Stress  Questionnaire    Feeling of Stress : Not at all  Social Connections: Moderately Isolated (06/12/2023)   Social Connection and Isolation Panel [NHANES]    Frequency of Communication with Friends and Family: More than three times a week    Frequency of Social Gatherings with Friends and Family: More than three times a week    Attends Religious Services: Never    Database administrator or Organizations: No    Attends Engineer, structural: Never    Marital Status: Married     Family History: The patient's family history includes Breast cancer in her paternal aunt; Breast cancer (age of onset: 27) in her mother; Liver disease in her brother.  ROS:   Please see  the history of present illness.     All other systems reviewed and are negative.  EKGs/Labs/Other Studies Reviewed:    The following studies were reviewed today:  EKG Interpretation Date/Time:  Wednesday Jul 16 2023 11:28:47 EDT Ventricular Rate:  65 PR Interval:  218 QRS Duration:  88 QT Interval:  404 QTC Calculation: 420 R Axis:   87  Text Interpretation: Sinus rhythm with 1st degree A-V block Cannot rule out Anterior infarct , age undetermined Confirmed by Constancia Delton ([id number]) on 07/16/2023 11:52:20 AM    Recent Labs: 03/13/2023: TSH 1.470 05/13/2023: ALT 15; BUN 23; Creatinine 1.32; Hemoglobin 13.5; Magnesium  1.8; Platelet Count 180; Potassium 4.3; Sodium 134  Recent Lipid Panel    Component Value Date/Time   CHOL 164 03/13/2023 0856   TRIG 76 03/13/2023 0856   HDL 71 03/13/2023 0856   CHOLHDL 2.3 03/13/2023 0856   LDLCALC 79 03/13/2023 0856     Risk Assessment/Calculations:             Physical Exam:    VS:  BP (!) 160/90 (BP Location: Right Arm, Patient Position: Sitting, Cuff Size: Large)   Pulse 65   Ht 5\' 4"  (1.626 m)   Wt 283 lb 6 oz (128.5 kg)   SpO2 98%   BMI 48.64 kg/m     Wt Readings from Last 3 Encounters:  07/16/23 283 lb 6 oz (128.5 kg)  07/11/23 283 lb 4 oz (128.5 kg)   06/12/23 283 lb (128.4 kg)     GEN:  Well nourished, well developed in no acute distress HEENT: Normal NECK: No JVD; No carotid bruits CARDIAC: RRR, no murmurs, rubs, gallops RESPIRATORY:  Clear to auscultation without rales, wheezing or rhonchi  ABDOMEN: Soft, non-tender, non-distended MUSCULOSKELETAL:  No edema; No deformity  SKIN: Warm and dry NEUROLOGIC:  Alert and oriented x 3 PSYCHIATRIC:  Normal affect   ASSESSMENT:    1. Abnormal EKG   2. Dyspnea, unspecified type   3. Essential (primary) hypertension   4. Obesity, morbid (HCC)    PLAN:    In order of problems listed above:  Abnormal ECG, possible anterior infarct.  EKG findings with low voltage QRS on anterior lateral leads likely due to obesity.  She denies chest pain.  Obtain echo to rule out any significant structural abnormalities. Dyspnea, likely from morbid obesity, echo as above.  Reassured patient if no significant abnormalities noted. Hypertension, BP elevated, usually controlled.  Continue olmesartan -HCTZ 40-25 mg daily. Morbid obesity, low-calorie diet, weight loss advised.  Follow-up after echo     Medication Adjustments/Labs and Tests Ordered: Current medicines are reviewed at length with the patient today.  Concerns regarding medicines are outlined above.  Orders Placed This Encounter  Procedures   EKG 12-Lead   ECHOCARDIOGRAM COMPLETE   No orders of the defined types were placed in this encounter.   Patient Instructions  Medication Instructions:  Your Physician recommend you continue on your current medication as directed.    *If you need a refill on your cardiac medications before your next appointment, please call your pharmacy*  Lab Work: No labs ordered today  If you have labs (blood work) drawn today and your tests are completely normal, you will receive your results only by: MyChart Message (if you have MyChart) OR A paper copy in the mail If you have any lab test that is abnormal  or we need to change your treatment, we will call you to review the results.  Testing/Procedures: Your physician  has requested that you have an echocardiogram. Echocardiography is a painless test that uses sound waves to create images of your heart. It provides your doctor with information about the size and shape of your heart and how well your heart's chambers and valves are working.   You may receive an ultrasound enhancing agent through an IV if needed to better visualize your heart during the echo. This procedure takes approximately one hour.  There are no restrictions for this procedure.  This will take place at [address] [facility]) [address], [place] [address]  Please note: We ask at that you not bring children with you during ultrasound (echo/ vascular) testing. Due to room size and safety concerns, children are not allowed in the ultrasound rooms during exams. Our front office staff cannot provide observation of children in our lobby area while testing is being conducted. An adult accompanying a patient to their appointment will only be allowed in the ultrasound room at the discretion of the ultrasound technician under special circumstances. We apologize for any inconvenience.   Follow-Up: At [facility], you and your health needs are our priority.  As part of our continuing mission to provide you with exceptional heart care, our providers are all part of one team.  This team includes your primary Cardiologist (physician) and Advanced Practice Providers or APPs (Physician Assistants and Nurse Practitioners) who all work together to provide you with the care you need, when you need it.  Your next appointment:   3 month(s)  Provider:   You may see Dr. Junnie Olives or one of the following Advanced Practice Providers on your designated Care Team:   Laneta Pintos, NP Gildardo Labrador, PA-C Varney Gentleman, PA-C Cadence [place], PA-C Ronald Cockayne, NP Morey Ar, NP    We recommend signing up for the patient portal called "MyChart".  Sign up information is provided on this After Visit Summary.  MyChart is used to connect with patients for Virtual Visits (Telemedicine).  Patients are able to view lab/test results, encounter notes, upcoming appointments, etc.  Non-urgent messages can be sent to your provider as well.   To learn more about what you can do with MyChart, go to [email or web address].      Signed, Constancia Delton, MD  07/16/2023 12:35 PM    [facility]

## 2023-07-29 ENCOUNTER — Telehealth: Payer: Self-pay | Admitting: Pharmacy Technician

## 2023-07-29 NOTE — Telephone Encounter (Signed)
 Oral Oncology Patient Advocate Encounter  After completing a benefits investigation, prior authorization for Tagrisso  is not required at this time through [employer].  Patient can continue filling at [facility].   Arby Dahir (Patty) Benjaman Branch, CPhT  [facility], [place], Cristine Done, [place] Oral Chemotherapy Patient Advocate Phone: [phone]  Fax: [phone]

## 2023-07-30 ENCOUNTER — Ambulatory Visit: Attending: Cardiology

## 2023-07-30 DIAGNOSIS — R0600 Dyspnea, unspecified: Secondary | ICD-10-CM | POA: Diagnosis not present

## 2023-07-30 LAB — ECHOCARDIOGRAM COMPLETE
AR max vel: 1.72 cm2
AV Area VTI: 1.85 cm2
AV Area mean vel: 1.77 cm2
AV Mean grad: 4.5 mmHg
AV Peak grad: 8.6 mmHg
Ao pk vel: 1.47 m/s
Area-P 1/2: 3.91 cm2
Calc EF: 72 %
S' Lateral: 2.8 cm
Single Plane A2C EF: 75.2 %
Single Plane A4C EF: 69.5 %

## 2023-08-06 ENCOUNTER — Other Ambulatory Visit: Payer: Self-pay | Admitting: Internal Medicine

## 2023-08-06 DIAGNOSIS — I10 Essential (primary) hypertension: Secondary | ICD-10-CM

## 2023-08-08 ENCOUNTER — Ambulatory Visit: Payer: Self-pay | Admitting: Cardiology

## 2023-08-08 NOTE — Telephone Encounter (Signed)
 Requested medication (s) are due for refill today: yes  Requested medication (s) are on the active medication list: yes  Last refill:  05/23/23 #90  Future visit scheduled: yes  Notes to clinic:  abnormal Na and Cr   Requested Prescriptions  Pending Prescriptions Disp Refills   olmesartan -hydrochlorothiazide (BENICAR  HCT) 40-25 MG tablet [Pharmacy Med Name: OLMESARTAN /HCTZ TABS 40/25MG ] 90 tablet 3    Sig: TAKE 1 TABLET DAILY     Cardiovascular: ARB + Diuretic Combos Failed - 08/08/2023 12:01 PM      Failed - Na in normal range and within 180 days    Sodium  Date Value Ref Range Status  05/13/2023 134 (L) 135 - 145 mmol/L Final  03/13/2023 141 134 - 144 mmol/L Final  01/22/2012 136 136 - 145 mmol/L Final         Failed - Cr in normal range and within 180 days    Creatinine  Date Value Ref Range Status  05/13/2023 1.32 (H) 0.44 - 1.00 mg/dL Final  65/78/4696 2.95 0.60 - 1.30 mg/dL Final         Failed - Last BP in normal range    BP Readings from Last 1 Encounters:  07/16/23 (!) 160/90         Passed - K in normal range and within 180 days    Potassium  Date Value Ref Range Status  05/13/2023 4.3 3.5 - 5.1 mmol/L Final  01/22/2012 4.0 3.5 - 5.1 mmol/L Final         Passed - eGFR is 10 or above and within 180 days    EGFR (African American)  Date Value Ref Range Status  10/13/2012 >60  Final   GFR calc Af Amer  Date Value Ref Range Status  10/06/2019 79 >59 mL/min/1.73 Final    Comment:    **[facility] currently reports eGFR in compliance with the current**   recommendations of the [employer]. [facility] will   update reporting as new guidelines are published from the NKF-ASN   Task force.    EGFR (Non-African Amer.)  Date Value Ref Range Status  10/13/2012 >60  Final    Comment:    eGFR values <21mL/min/1.73 m2 may be an indication of chronic kidney disease (CKD). Calculated eGFR is useful in patients with stable renal function. The eGFR  calculation will not be reliable in acutely ill patients when serum creatinine is changing rapidly. It is not useful in  patients on dialysis. The eGFR calculation may not be applicable to patients at the low and high extremes of body sizes, pregnant women, and vegetarians.    GFR, Estimated  Date Value Ref Range Status  05/13/2023 42 (L) >60 mL/min Final    Comment:    (NOTE) Calculated using the CKD-EPI Creatinine Equation (2021)    eGFR  Date Value Ref Range Status  03/13/2023 42 (L) >59 mL/min/1.73 Final         Passed - Patient is not pregnant      Passed - Valid encounter within last 6 months    Recent Outpatient Visits           4 weeks ago Type II diabetes mellitus with complication [facility])   [facility] at [facility], [place], MD       Future Appointments             In 2 months Agbor-Etang, Polly Brink, MD [facility] at [place]   In  3 months Sheron Dixons, MD [facility] at [facility], [facility]

## 2023-08-11 ENCOUNTER — Other Ambulatory Visit: Payer: Self-pay | Admitting: Internal Medicine

## 2023-08-11 DIAGNOSIS — C3411 Malignant neoplasm of upper lobe, right bronchus or lung: Secondary | ICD-10-CM

## 2023-08-13 ENCOUNTER — Inpatient Hospital Stay (HOSPITAL_BASED_OUTPATIENT_CLINIC_OR_DEPARTMENT_OTHER): Admitting: Internal Medicine

## 2023-08-13 ENCOUNTER — Encounter: Payer: Self-pay | Admitting: Internal Medicine

## 2023-08-13 ENCOUNTER — Inpatient Hospital Stay: Attending: Internal Medicine

## 2023-08-13 VITALS — BP 155/59 | HR 65 | Temp 97.4°F | Resp 16 | Ht 64.0 in | Wt 284.1 lb

## 2023-08-13 DIAGNOSIS — C3411 Malignant neoplasm of upper lobe, right bronchus or lung: Secondary | ICD-10-CM | POA: Diagnosis not present

## 2023-08-13 DIAGNOSIS — I129 Hypertensive chronic kidney disease with stage 1 through stage 4 chronic kidney disease, or unspecified chronic kidney disease: Secondary | ICD-10-CM | POA: Insufficient documentation

## 2023-08-13 DIAGNOSIS — E1122 Type 2 diabetes mellitus with diabetic chronic kidney disease: Secondary | ICD-10-CM | POA: Insufficient documentation

## 2023-08-13 DIAGNOSIS — Z79899 Other long term (current) drug therapy: Secondary | ICD-10-CM | POA: Diagnosis not present

## 2023-08-13 DIAGNOSIS — N183 Chronic kidney disease, stage 3 unspecified: Secondary | ICD-10-CM | POA: Diagnosis not present

## 2023-08-13 DIAGNOSIS — E559 Vitamin D deficiency, unspecified: Secondary | ICD-10-CM

## 2023-08-13 LAB — CMP (CANCER CENTER ONLY)
ALT: 15 U/L (ref 0–44)
AST: 21 U/L (ref 15–41)
Albumin: 3.6 g/dL (ref 3.5–5.0)
Alkaline Phosphatase: 54 U/L (ref 38–126)
Anion gap: 8 (ref 5–15)
BUN: 23 mg/dL (ref 8–23)
CO2: 28 mmol/L (ref 22–32)
Calcium: 9.2 mg/dL (ref 8.9–10.3)
Chloride: 101 mmol/L (ref 98–111)
Creatinine: 1.23 mg/dL — ABNORMAL HIGH (ref 0.44–1.00)
GFR, Estimated: 46 mL/min — ABNORMAL LOW (ref >60)
Glucose, Bld: 114 mg/dL — ABNORMAL HIGH (ref 70–99)
Potassium: 4 mmol/L (ref 3.5–5.1)
Sodium: 137 mmol/L (ref 135–145)
Total Bilirubin: 0.9 mg/dL (ref 0.0–1.2)
Total Protein: 7.6 g/dL (ref 6.5–8.1)

## 2023-08-13 LAB — CBC WITH DIFFERENTIAL (CANCER CENTER ONLY)
Abs Immature Granulocytes: 0.02 10*3/uL (ref 0.00–0.07)
Basophils Absolute: 0 10*3/uL (ref 0.0–0.1)
Basophils Relative: 0 %
Eosinophils Absolute: 0.2 10*3/uL (ref 0.0–0.5)
Eosinophils Relative: 5 %
HCT: 42 % (ref 36.0–46.0)
Hemoglobin: 13.5 g/dL (ref 12.0–15.0)
Immature Granulocytes: 0 %
Lymphocytes Relative: 20 %
Lymphs Abs: 0.9 10*3/uL (ref 0.7–4.0)
MCH: 27.7 pg (ref 26.0–34.0)
MCHC: 32.1 g/dL (ref 30.0–36.0)
MCV: 86.2 fL (ref 80.0–100.0)
Monocytes Absolute: 0.5 10*3/uL (ref 0.1–1.0)
Monocytes Relative: 12 %
Neutro Abs: 2.9 10*3/uL (ref 1.7–7.7)
Neutrophils Relative %: 63 %
Platelet Count: 187 10*3/uL (ref 150–400)
RBC: 4.87 MIL/uL (ref 3.87–5.11)
RDW: 13.9 % (ref 11.5–15.5)
WBC Count: 4.6 10*3/uL (ref 4.0–10.5)
nRBC: 0 % (ref 0.0–0.2)

## 2023-08-13 LAB — MAGNESIUM: Magnesium: 1.8 mg/dL (ref 1.7–2.4)

## 2023-08-13 NOTE — Progress Notes (Signed)
  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  [Dr.Byrnett & Dr.Fleming]; May 2022-MRI brain-NEG.   # MAY 11th, 2022- carbo-alimta  x1 cycle; discontinued; June 6th, 2022-osimertinib  80 mg a day  #Thyroid  -enlargement; recommend biopsy per radiology; patient /husband has been reluctant  # Incidental-out of place Gastric band [since 2014] gastric band.  Asymptomatic monitor for now  # NGS/MOLECULAR TESTS:POSITIVE for EGFR mutation deletion 19    # PALLIATIVE CARE EVALUATION:  # PAIN MANAGEMENT:    DIAGNOSIS: Lung cancer  STAGE:   IIIC      ;  GOALS:Palliatve  CURRENT/MOST RECENT THERAPY : Osiemertinib      Primary cancer of right upper lobe of lung (HCC)  06/30/2020 Initial Diagnosis   Primary cancer of right upper lobe of lung (HCC)   06/30/2020 Cancer Staging   Staging form: Lung, AJCC 8th Edition - Clinical: Stage IIIC (cT4, cN3, cM0) - Signed by Gwyn Leos, MD on 06/30/2020 Histopathologic type: Adenocarcinoma, NOS   07/05/2020 - 07/05/2020 Chemotherapy   Patient is on Treatment Plan : LUNG CARBOplatin  / Pemetrexed  / Pembrolizumab q21d Induction x 4 cycles / Maintenance Pemetrexed  + Pembrolizumab      HISTORY OF PRESENTING ILLNESS:  She is accompanied by husband.  Ambulating independently.   Megan Shields 78 y.o.  female  Stage IV/adenocarcinoma the lung EGFR mutated-currently on osimertinib  is here for follow-up.  Pt has have chronic mild  dyspnea- not any worse.  Otherwise denies any worsening cough.  Admits to compliance with her Tagrisso .    Denies any chest pain. Mild chronic Swelling in the legs. No nausea no vomiting .  No diarrhea.  Chronic joint pains.   Review of Systems  Constitutional:  Positive for malaise/fatigue. Negative for chills, diaphoresis, fever and weight loss.  HENT:  Negative for nosebleeds and sore throat.   Eyes:  Negative for double vision.  Respiratory:  Negative for hemoptysis, sputum production and wheezing.   Cardiovascular:  Negative for chest pain, palpitations, orthopnea and leg swelling.  Gastrointestinal:  Negative for abdominal pain, blood in stool, constipation, diarrhea, heartburn, melena, nausea and vomiting.  Genitourinary:  Negative for dysuria, frequency and urgency.  Musculoskeletal:  Negative for back pain and joint pain.  Neurological:  Negative for dizziness, tingling, focal weakness, weakness and headaches.  Endo/Heme/Allergies:  Does not bruise/bleed easily.  Psychiatric/Behavioral:  Negative for depression. The patient is not nervous/anxious and does not have insomnia.      MEDICAL HISTORY:  Past Medical History:  Diagnosis Date   Anemia    vitamin d deficiency   Arthritis    Chronic cough    Depression    Diabetes mellitus without complication (HCC)    Dyspnea    Hypertension    ILD (interstitial lung disease) (HCC)    Obesity    BMI 49.44   Pneumonia    Pneumonia due to 2019 novel coronavirus 2022   community acquired also. covid positive May 2022   PONV (postoperative nausea and vomiting)    Primary cancer of right upper lobe of lung (HCC) 06/30/2020   Rosacea  Shoulder contusion 11/22/2015   Vertigo    none since 3/23    SURGICAL HISTORY: Past Surgical History:  Procedure Laterality Date   BLEPHAROPLASTY Bilateral 12/19/2017   CARPAL TUNNEL RELEASE Bilateral 1985   CATARACT EXTRACTION W/PHACO Right 05/22/2022   Procedure: CATARACT EXTRACTION PHACO  AND INTRAOCULAR LENS PLACEMENT (IOC) RIGHT DIABETIC;  Surgeon: Annell Kidney, MD;  Location: [facility];  Service: Ophthalmology;  Laterality: Right;  3.79 0:41.1   CATARACT EXTRACTION W/PHACO Left 06/12/2022   Procedure: CATARACT EXTRACTION PHACO AND INTRAOCULAR LENS PLACEMENT (IOC) LEFT DIABETIC  6.12  00:46.2;  Surgeon: Annell Kidney, MD;  Location: [facility];  Service: Ophthalmology;  Laterality: Left;  Diabetic   CHOLECYSTECTOMY     COLONOSCOPY WITH PROPOFOL  N/A 03/07/2017   Procedure: COLONOSCOPY WITH PROPOFOL ;  Surgeon: Luke Salaam, MD;  Location: [facility];  Service: Gastroenterology;  Laterality: N/A;   DILATION AND CURETTAGE OF UTERUS  12/2011   benign endometrial polyp   HALLUX VALGUS AUSTIN Right 09/02/2014   Procedure: HALLUX VALGUS AUSTIN;  Surgeon: Anell Baptist, DPM;  Location: [facility];  Service: Podiatry;  Laterality: Right;   HERNIA REPAIR     JOINT REPLACEMENT Bilateral    TKR   LAPAROSCOPIC GASTRIC BANDING  2010   Lap Band   LAPAROSCOPIC TOTAL HYSTERECTOMY  12/23/2019   lymph node removed Right    TOTAL KNEE ARTHROPLASTY Bilateral 2003, 2004   TOTAL KNEE ARTHROPLASTY Bilateral    VENTRAL HERNIA REPAIR  2008    SOCIAL HISTORY: Social History   Socioeconomic History   Marital status: Married    Spouse name: Hewitt Lou   Number of children: 2   Years of education: Not on file   Highest education level: Not on file  Occupational History   Occupation: Therapist, music    Comment: RETIRED  Tobacco Use   Smoking status: Never    Passive exposure: Never   Smokeless tobacco: Never  Vaping Use   Vaping status: Never Used  Substance and Sexual Activity   Alcohol use: No    Alcohol/week: 0.0 standard drinks of alcohol   Drug use: No   Sexual activity: Not Currently  Other Topics Concern   Not on file  Social History Narrative   Never smoked; no alcohol. Lives in [place] with husband . Home maker.    Daughter lives next door. She  cooks dinner for patient on most nights   Social Drivers of Health   Financial Resource Strain: Low Risk  (06/12/2023)   Overall Financial Resource Strain (CARDIA)    Difficulty of Paying Living Expenses: Not hard at all  Food Insecurity: No Food Insecurity (06/12/2023)   Hunger Vital Sign    Worried About Running Out of Food in the Last Year: Never true    Ran Out of Food in the Last Year: Never true  Transportation Needs: No Transportation Needs (06/12/2023)   PRAPARE - Administrator, Civil Service (Medical): No    Lack of Transportation (Non-Medical): No  Physical Activity: Inactive (06/12/2023)   Exercise Vital Sign    Days of Exercise per Week: 0 days    Minutes of Exercise per Session: 0 min  Stress: No Stress Concern Present (06/12/2023)   [employer] of Occupational Health - Occupational Stress Questionnaire    Feeling of Stress : Not at all  Social Connections: Moderately Isolated (06/12/2023)   Social Connection and Isolation Panel    Frequency of Communication with Friends and Family: More than three times  a week    Frequency of Social Gatherings with Friends and Family: More than three times a week    Attends Religious Services: Never    Database administrator or Organizations: No    Attends Banker Meetings: Never    Marital Status: Married  Catering manager Violence: Not At Risk (06/12/2023)   Humiliation, Afraid, Rape, and Kick questionnaire    Fear of Current or Ex-Partner: No    Emotionally Abused: No    Physically Abused: No    Sexually Abused: No    FAMILY HISTORY: Family History  Problem Relation Age of Onset   Breast cancer Mother 59   Liver disease Brother        died 66   Breast cancer Paternal Aunt        3 pat aunts    ALLERGIES:  is allergic to pneumococcal vaccines, wound dressing adhesive, amoxicillin, codeine, and penicillins.  MEDICATIONS:  Current Outpatient Medications  Medication Sig Dispense Refill    acetaminophen  (TYLENOL ) 500 MG tablet Take 1,000 mg by mouth at bedtime as needed for moderate pain.     celecoxib  (CELEBREX ) 200 MG capsule TAKE 1 CAPSULE DAILY 90 capsule 0   cholecalciferol (VITAMIN D3) 25 MCG (1000 UNIT) tablet Take 1,000 Units by mouth daily.     cyclobenzaprine  (FLEXERIL ) 10 MG tablet Take 1 tablet (10 mg total) by mouth at bedtime. 30 tablet 1   FREESTYLE LITE test strip TEST BLOOD SUGAR TWICE A DAY 100 strip 6   Lancets (FREESTYLE) lancets TEST BLOOD SUGAR TWICE A DAY 100 each 6   MOUNJARO  7.5 MG/0.5ML Pen INJECT 7.5 MG UNDER THE SKIN WEEKLY 6 mL 3   olmesartan -hydrochlorothiazide (BENICAR  HCT) 40-25 MG tablet TAKE 1 TABLET DAILY 90 tablet 3   ondansetron  (ZOFRAN ) 8 MG tablet One pill every 8 hours as needed for nausea/vomitting. 40 tablet 1   OXYGEN  Inhale into the lungs at bedtime.     prochlorperazine  (COMPAZINE ) 10 MG tablet Take 1 tablet (10 mg total) by mouth every 6 (six) hours as needed for nausea or vomiting. 90 tablet 3   Pumpkin Seed-Soy Germ (AZO BLADDER CONTROL/GO-LESS PO) Take by mouth.     TAGRISSO  40 MG tablet TAKE 1 TABLET DAILY 90 tablet 0   No current facility-administered medications for this visit.    PHYSICAL EXAMINATION: ECOG PERFORMANCE STATUS: 0 - Asymptomatic  Vitals:   08/13/23 1016 08/13/23 1036  BP: (!) 153/66 (!) 155/59  Pulse: 65   Resp: 16   Temp: (!) 97.4 F (36.3 C)   SpO2: 100%      Filed Weights   08/13/23 1016  Weight: 284 lb 1.6 oz (128.9 kg)       Physical Exam Constitutional:      Comments: Obese.    HENT:     Head: Normocephalic and atraumatic.     Mouth/Throat:     Pharynx: No oropharyngeal exudate.   Eyes:     Pupils: Pupils are equal, round, and reactive to light.    Cardiovascular:     Rate and Rhythm: Normal rate and regular rhythm.  Pulmonary:     Effort: No respiratory distress.     Breath sounds: No wheezing.     Comments: Clear to auscultation bilaterally. Abdominal:     General: Bowel  sounds are normal. There is no distension.     Palpations: Abdomen is soft. There is no mass.     Tenderness: There is no abdominal tenderness. There  is no guarding or rebound.   Musculoskeletal:        General: No tenderness. Normal range of motion.     Cervical back: Normal range of motion and neck supple.   Skin:    General: Skin is warm.   Neurological:     Mental Status: She is alert and oriented to person, place, and time.   Psychiatric:        Mood and Affect: Affect normal.    Erythema of the ball of the left big toe.  No discharge noted.  LABORATORY DATA:  I have reviewed the data as listed Lab Results  Component Value Date   WBC 4.6 08/13/2023   HGB 13.5 08/13/2023   HCT 42.0 08/13/2023   MCV 86.2 08/13/2023   PLT 187 08/13/2023   Recent Labs    02/12/23 0947 03/13/23 0856 05/13/23 1014 08/13/23 0954  NA 137 141 134* 137  K 4.1 4.7 4.3 4.0  CL 100 101 98 101  CO2 28 23 27 28   GLUCOSE 128* 132* 117* 114*  BUN 21 25 23 23   CREATININE 1.14* 1.33* 1.32* 1.23*  CALCIUM  9.2 9.7 9.2 9.2  GFRNONAA 50*  --  42* 46*  PROT 7.4 7.0 7.3 7.6  ALBUMIN 3.3* 3.8 3.5 3.6  AST 19 19 23 21   ALT 13 12 15 15   ALKPHOS 52 65 50 54  BILITOT 0.5 0.4 0.6 0.9    RADIOGRAPHIC STUDIES: I have personally reviewed the radiological images as listed and agreed with the findings in the report.   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 Osimertinib  80 mg once a day [07/31/2020] but currently on Osimetinib 40 mg/day [severe fatigue- since July 2023]; ]MARCH 10th, 2025-  Stable spiculated right upper lobe nodule;  No evidence of metastatic disease in the chest. Unchanged appearance of chronic interstitial lung disease. Stable. JUNE 2025- Echo-EFis 60 to 65% [Dr.Etang] left ventricle has normal function   # Continue osimertinib  at 40 mg/day  no overt progression of  disease [see above]; clinically-  stable. Will continue surveillance scans; will order today/2 months.   # # Myalgias- ? Etiology- recommend doubling up on MVT with vit D- 10000- stable/improved.    # Fatigue: likley sec to osimertinib . Continue  decreased the dose to 40 mg/day.  stable.  # CKD stage III monitor closely.  Recommend continued hydration control of blood pressure/diabetes. stable.  # INCIDENTAL Left sublingual fullness/ Incidental large thyroid  goiter/nodule noted- s/p- tongue mass/thyroid  nodule- OCT 2023- CT neck-stable.Monitor for now.-   stable.  #Chronic joint pains-continue Celebrex  every other day [see below]; Tylenol  as needed for pain- stable.  #Diabetes /type II -PBF- BG- 116; currently on Munjauro/ GLP-1 agonist- stable.  # IV access: PIV  # DISPOSITION:   # follow up in 3 months MD; labs- cbc/cmp/mag; vit D 25-OH-- CT scan- neck/chest- -Dr.B    All questions were answered. The patient knows to call the clinic with any problems, questions or concerns.    Gwyn Leos, MD 08/13/2023 11:42 AM

## 2023-08-13 NOTE — Assessment & Plan Note (Addendum)
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 Osimertinib  80 mg once a day [07/31/2020] but currently on Osimetinib 40 mg/day [severe fatigue- since July 2023]; ]MARCH 10th, 2025-  Stable spiculated right upper lobe nodule;  No evidence of metastatic disease in the chest. Unchanged appearance of chronic interstitial lung disease. Stable. JUNE 2025- Echo-EFis 60 to 65% [Dr.Etang] left ventricle has normal function   # Continue osimertinib  at 40 mg/day  no overt progression of disease [see above]; clinically-  stable. Will continue surveillance scans; will order today/2 months.   # # Myalgias- ? Etiology- recommend doubling up on MVT with vit D- 10000- stable/improved.    # Fatigue: likley sec to osimertinib . Continue  decreased the dose to 40 mg/day.  stable.  # CKD stage III monitor closely.  Recommend continued hydration control of blood pressure/diabetes. stable.  # INCIDENTAL Left sublingual fullness/ Incidental large thyroid  goiter/nodule noted- s/p- tongue mass/thyroid  nodule- OCT 2023- CT neck-stable.Monitor for now.-   stable.  #Chronic joint pains-continue Celebrex  every other day [see below]; Tylenol  as needed for pain- stable.  #Diabetes /type II -PBF- BG- 116; currently on Munjauro/ GLP-1 agonist- stable.  # IV access: PIV  # DISPOSITION:   # follow up in 3 months MD; labs- cbc/cmp/mag; vit D 25-OH-- CT scan- neck/chest- -Dr.B

## 2023-08-13 NOTE — Progress Notes (Signed)
 07/30/23 Echocardiogram with Dr. Junnie Olives.

## 2023-09-01 ENCOUNTER — Other Ambulatory Visit: Payer: Self-pay | Admitting: Internal Medicine

## 2023-09-01 DIAGNOSIS — M170 Bilateral primary osteoarthritis of knee: Secondary | ICD-10-CM

## 2023-09-02 NOTE — Telephone Encounter (Signed)
 Requested Prescriptions  Pending Prescriptions Disp Refills   celecoxib  (CELEBREX ) 200 MG capsule [Pharmacy Med Name: CELECOXIB  CAPS 200MG ] 90 capsule 0    Sig: TAKE 1 CAPSULE DAILY     Analgesics:  COX2 Inhibitors Failed - 09/02/2023  4:02 PM      Failed - Manual Review: Labs are only required if the patient has taken medication for more than 8 weeks.      Failed - Cr in normal range and within 360 days    Creatinine  Date Value Ref Range Status  08/13/2023 1.23 (H) 0.44 - 1.00 mg/dL Final  91/80/7985 9.06 0.60 - 1.30 mg/dL Final         Passed - HGB in normal range and within 360 days    Hemoglobin  Date Value Ref Range Status  08/13/2023 13.5 12.0 - 15.0 g/dL Final  91/88/7978 85.4 11.1 - 15.9 g/dL Final         Passed - HCT in normal range and within 360 days    HCT  Date Value Ref Range Status  08/13/2023 42.0 36.0 - 46.0 % Final   Hematocrit  Date Value Ref Range Status  10/06/2019 46.0 34.0 - 46.6 % Final         Passed - AST in normal range and within 360 days    AST  Date Value Ref Range Status  08/13/2023 21 15 - 41 U/L Final         Passed - ALT in normal range and within 360 days    ALT  Date Value Ref Range Status  08/13/2023 15 0 - 44 U/L Final         Passed - eGFR is 30 or above and within 360 days    EGFR (African American)  Date Value Ref Range Status  10/13/2012 >60  Final   GFR calc Af Amer  Date Value Ref Range Status  10/06/2019 79 >59 mL/min/1.73 Final    Comment:    **[facility] currently reports eGFR in compliance with the current**   recommendations of the [employer]. [facility] will   update reporting as new guidelines are published from the NKF-ASN   Task force.    EGFR (Non-African Amer.)  Date Value Ref Range Status  10/13/2012 >60  Final    Comment:    eGFR values <72mL/min/1.73 m2 may be an indication of chronic kidney disease (CKD). Calculated eGFR is useful in patients with stable renal function. The eGFR  calculation will not be reliable in acutely ill patients when serum creatinine is changing rapidly. It is not useful in  patients on dialysis. The eGFR calculation may not be applicable to patients at the low and high extremes of body sizes, pregnant women, and vegetarians.    GFR, Estimated  Date Value Ref Range Status  08/13/2023 46 (L) >60 mL/min Final    Comment:    (NOTE) Calculated using the CKD-EPI Creatinine Equation (2021)    eGFR  Date Value Ref Range Status  03/13/2023 42 (L) >59 mL/min/1.73 Final         Passed - Patient is not pregnant      Passed - Valid encounter within last 12 months    Recent Outpatient Visits           1 month ago Type II diabetes mellitus with complication [facility])   [place] [facility] at [facility], Leita DEL, MD       Future Appointments  In 2 months Justus, Leita DEL, MD [facility] at [facility], [facility]

## 2023-09-30 ENCOUNTER — Other Ambulatory Visit: Payer: Self-pay | Admitting: Internal Medicine

## 2023-09-30 DIAGNOSIS — C3411 Malignant neoplasm of upper lobe, right bronchus or lung: Secondary | ICD-10-CM

## 2023-10-16 ENCOUNTER — Ambulatory Visit: Admitting: Cardiology

## 2023-11-13 ENCOUNTER — Ambulatory Visit
Admission: RE | Admit: 2023-11-13 | Discharge: 2023-11-13 | Disposition: A | Discharge status: Home / Self Care | Source: Ambulatory Visit | Attending: Internal Medicine | Admitting: Internal Medicine

## 2023-11-13 DIAGNOSIS — C3411 Malignant neoplasm of upper lobe, right bronchus or lung: Secondary | ICD-10-CM | POA: Insufficient documentation

## 2023-11-13 DIAGNOSIS — C7989 Secondary malignant neoplasm of other specified sites: Secondary | ICD-10-CM | POA: Diagnosis not present

## 2023-11-13 DIAGNOSIS — Z9221 Personal history of antineoplastic chemotherapy: Secondary | ICD-10-CM | POA: Diagnosis not present

## 2023-11-13 DIAGNOSIS — I700 Atherosclerosis of aorta: Secondary | ICD-10-CM | POA: Diagnosis not present

## 2023-11-13 DIAGNOSIS — C3490 Malignant neoplasm of unspecified part of unspecified bronchus or lung: Secondary | ICD-10-CM | POA: Diagnosis not present

## 2023-11-13 DIAGNOSIS — C770 Secondary and unspecified malignant neoplasm of lymph nodes of head, face and neck: Secondary | ICD-10-CM | POA: Diagnosis not present

## 2023-11-13 LAB — POCT I-STAT CREATININE: Creatinine, Ser: 1.4 mg/dL — ABNORMAL HIGH (ref 0.44–1.00)

## 2023-11-13 MED ORDER — IOHEXOL 300 MG/ML  SOLN
75.0000 mL | Freq: Once | INTRAMUSCULAR | Status: AC | PRN
  Administered 2023-11-13: 75 mL via INTRAVENOUS

## 2023-11-13 MED ORDER — SODIUM CHLORIDE 0.9 % IV SOLN: INTRAVENOUS | Status: DC

## 2023-11-14 ENCOUNTER — Ambulatory Visit (INDEPENDENT_AMBULATORY_CARE_PROVIDER_SITE_OTHER): Admitting: Internal Medicine

## 2023-11-14 ENCOUNTER — Encounter: Payer: Self-pay | Admitting: Internal Medicine

## 2023-11-14 VITALS — BP 122/78 | HR 61 | Temp 97.6°F | Resp 18 | Ht 64.0 in | Wt 286.0 lb

## 2023-11-14 DIAGNOSIS — C3411 Malignant neoplasm of upper lobe, right bronchus or lung: Secondary | ICD-10-CM

## 2023-11-14 DIAGNOSIS — I10 Essential (primary) hypertension: Secondary | ICD-10-CM | POA: Diagnosis not present

## 2023-11-14 DIAGNOSIS — Z7985 Long-term (current) use of injectable non-insulin antidiabetic drugs: Secondary | ICD-10-CM

## 2023-11-14 DIAGNOSIS — E118 Type 2 diabetes mellitus with unspecified complications: Secondary | ICD-10-CM | POA: Diagnosis not present

## 2023-11-14 DIAGNOSIS — M25512 Pain in left shoulder: Secondary | ICD-10-CM

## 2023-11-14 LAB — POCT GLYCOSYLATED HEMOGLOBIN (HGB A1C): Hemoglobin A1C: 6.3 % — AB (ref 4.0–5.6)

## 2023-11-14 NOTE — Assessment & Plan Note (Signed)
 Blood pressure is well controlled on Benicar  and hydrochlorothiazide. No medication side effects noted. Plan to continue current medications.

## 2023-11-14 NOTE — Progress Notes (Signed)
 Date:  11/14/2023   Name:  Megan Shields   DOB:  1945/09/05   MRN:  [id number]   Chief Complaint: Diabetes and Hypertension  Hypertension This is a chronic problem. The problem is controlled. Pertinent negatives include no chest pain, headaches, palpitations or shortness of breath. Past treatments include angiotensin blockers and diuretics.  Diabetes She presents for her follow-up diabetic visit. She has type 2 diabetes mellitus. Pertinent negatives for hypoglycemia include no headaches or tremors. Pertinent negatives for diabetes include no chest pain, no fatigue, no polydipsia and no polyuria. Current diabetic treatments: Mounjaro .  Muscle Pain This is a recurrent problem. Pain location: diffuse - wakes up during the night despite Tylenol  1000 mg qhs and Celebrex . Pertinent negatives include no abdominal pain, chest pain, dysuria, fatigue, fever, headaches or shortness of breath.    Review of Systems  Constitutional:  Negative for appetite change, fatigue, fever and unexpected weight change.  HENT:  Negative for tinnitus and trouble swallowing.   Eyes:  Negative for visual disturbance.  Respiratory:  Negative for cough, chest tightness and shortness of breath.   Cardiovascular:  Negative for chest pain, palpitations and leg swelling.  Gastrointestinal:  Negative for abdominal pain.  Endocrine: Negative for polydipsia and polyuria.  Genitourinary:  Negative for dysuria and hematuria.  Musculoskeletal:  Positive for arthralgias and myalgias.  Neurological:  Negative for tremors, numbness and headaches.  Psychiatric/Behavioral:  Negative for dysphoric mood.      Lab Results  Component Value Date   NA 137 08/13/2023   K 4.0 08/13/2023   CO2 28 08/13/2023   GLUCOSE 114 (H) 08/13/2023   BUN 23 08/13/2023   CREATININE 1.40 (H) 11/13/2023   CALCIUM  9.2 08/13/2023   EGFR 42 (L) 03/13/2023   GFRNONAA 46 (L) 08/13/2023   Lab Results  Component Value Date   CHOL 164 03/13/2023    HDL 71 03/13/2023   LDLCALC 79 03/13/2023   TRIG 76 03/13/2023   CHOLHDL 2.3 03/13/2023   Lab Results  Component Value Date   TSH 1.470 03/13/2023   Lab Results  Component Value Date   HGBA1C 6.3 (A) 11/14/2023   Lab Results  Component Value Date   WBC 4.6 08/13/2023   HGB 13.5 08/13/2023   HCT 42.0 08/13/2023   MCV 86.2 08/13/2023   PLT 187 08/13/2023   Lab Results  Component Value Date   ALT 15 08/13/2023   AST 21 08/13/2023   ALKPHOS 54 08/13/2023   BILITOT 0.9 08/13/2023   Lab Results  Component Value Date   VD25OH 34.9 03/07/2020     Patient Active Problem List   Diagnosis Date Noted   CKD stage 3b, GFR 30-44 ml/min (HCC) 07/11/2023   AVB (atrioventricular block) 07/11/2023   Obesity, morbid (HCC) 03/13/2023   Secondary and unspecified malignant neoplasm of lymph nodes of head, 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   ILD (interstitial lung disease) (HCC) 01/30/2022   Enlarged thyroid  gland 11/26/2021   Drug-induced neutropenia (HCC) 07/17/2020   Primary cancer of right upper lobe of lung (HCC) 06/30/2020   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   Vitamin D deficiency 10/06/2019   Rosacea 04/08/2019   Hyperlipidemia associated with type 2 diabetes mellitus (HCC) 04/08/2019   Vertigo 04/07/2018   Tubular adenoma of colon 03/10/2017   Tinea pedis of left foot 11/22/2016   Type II diabetes mellitus with complication (HCC) 07/06/2015   Essential (primary) hypertension 08/01/2014   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   Arthritis of knee,  degenerative 08/01/2014   Low back strain 08/01/2014   Status post bariatric surgery 08/01/2014    Allergies  Allergen Reactions   Pneumococcal Vaccines Hives   Wound Dressing Adhesive Rash    Paper tape is okay. Tears off skin. blisters   Amoxicillin Rash   Codeine     Other reaction(s): drowsiness   Penicillins Rash    Past Surgical History:  Procedure Laterality Date   BLEPHAROPLASTY Bilateral  12/19/2017   CARPAL TUNNEL RELEASE Bilateral 1985   CATARACT EXTRACTION W/PHACO Right 05/22/2022   Procedure: CATARACT EXTRACTION PHACO AND INTRAOCULAR LENS PLACEMENT (IOC) RIGHT DIABETIC;  Surgeon: Mittie Gaskin, MD;  Location: [facility];  Service: Ophthalmology;  Laterality: Right;  3.79 0:41.1   CATARACT EXTRACTION W/PHACO Left 06/12/2022   Procedure: CATARACT EXTRACTION PHACO AND INTRAOCULAR LENS PLACEMENT (IOC) LEFT DIABETIC  6.12  00:46.2;  Surgeon: Mittie Gaskin, MD;  Location: [facility];  Service: Ophthalmology;  Laterality: Left;  Diabetic   CHOLECYSTECTOMY     COLONOSCOPY WITH PROPOFOL  N/A 03/07/2017   Procedure: COLONOSCOPY WITH PROPOFOL ;  Surgeon: Therisa Bi, MD;  Location: [facility];  Service: Gastroenterology;  Laterality: N/A;   DILATION AND CURETTAGE OF UTERUS  12/2011   benign endometrial polyp   HALLUX VALGUS AUSTIN Right 09/02/2014   Procedure: HALLUX VALGUS AUSTIN;  Surgeon: Eva Gay, DPM;  Location: [facility];  Service: Podiatry;  Laterality: Right;   HERNIA REPAIR     JOINT REPLACEMENT Bilateral    TKR   LAPAROSCOPIC GASTRIC BANDING  2010   Lap Band   LAPAROSCOPIC TOTAL HYSTERECTOMY  12/23/2019   lymph node removed Right    TOTAL KNEE ARTHROPLASTY Bilateral 2003, 2004   TOTAL KNEE ARTHROPLASTY Bilateral    VENTRAL HERNIA REPAIR  2008    Social History   Tobacco Use   Smoking status: Never    Passive exposure: Never   Smokeless tobacco: Never  Vaping Use   Vaping status: Never Used  Substance Use Topics   Alcohol use: No    Alcohol/week: 0.0 standard drinks of alcohol   Drug use: No     Medication list has been reviewed and updated.  Current Meds  Medication Sig   acetaminophen  (TYLENOL ) 500 MG tablet Take 1,000 mg by mouth at bedtime as needed for moderate pain.   celecoxib  (CELEBREX ) 200 MG capsule TAKE 1 CAPSULE DAILY   cholecalciferol (VITAMIN D3) 25 MCG (1000 UNIT) tablet Take 1,000 Units by mouth daily.    cyclobenzaprine  (FLEXERIL ) 10 MG tablet Take 1 tablet (10 mg total) by mouth at bedtime.   FREESTYLE LITE test strip TEST BLOOD SUGAR TWICE A DAY   Lancets (FREESTYLE) lancets TEST BLOOD SUGAR TWICE A DAY   MOUNJARO  7.5 MG/0.5ML Pen INJECT 7.5 MG UNDER THE SKIN WEEKLY   olmesartan -hydrochlorothiazide (BENICAR  HCT) 40-25 MG tablet TAKE 1 TABLET DAILY   ondansetron  (ZOFRAN ) 8 MG tablet One pill every 8 hours as needed for nausea/vomitting.   OXYGEN  Inhale into the lungs at bedtime.   prochlorperazine  (COMPAZINE ) 10 MG tablet Take 1 tablet (10 mg total) by mouth every 6 (six) hours as needed for nausea or vomiting.   Pumpkin Seed-Soy Germ (AZO BLADDER CONTROL/GO-LESS PO) Take by mouth.   TAGRISSO  40 MG tablet TAKE 1 TABLET DAILY       07/11/2023    9:46 AM 03/13/2023    8:29 AM 10/04/2022    8:37 AM 06/04/2022    3:19 PM  GAD 7 : Generalized Anxiety Score  Nervous,  Anxious, on Edge 0 0 0 0  Control/stop worrying 0 0 0 0  Worry too much - different things 0 0 0 0  Trouble relaxing 0 0 0 0  Restless 0 0 0 0  Easily annoyed or irritable 0 0 0 0  Afraid - awful might happen 0 0 0 0  Total GAD 7 Score 0 0 0 0  Anxiety Difficulty Not difficult at all Not difficult at all Not difficult at all Not difficult at all       11/14/2023    9:42 AM 07/11/2023    9:45 AM 06/12/2023    8:47 AM  Depression screen PHQ 2/9  Decreased Interest 0 0 0  Down, Depressed, Hopeless 0 0 0  PHQ - 2 Score 0 0 0  Altered sleeping  0 0  Tired, decreased energy  3 3  Change in appetite  0 0  Feeling bad or failure about yourself   0 0  Trouble concentrating  0 0  Moving slowly or fidgety/restless  0 0  Suicidal thoughts  0 0  PHQ-9 Score  3 3  Difficult doing work/chores  Not difficult at all Not difficult at all    BP Readings from Last 3 Encounters:  11/14/23 122/78  08/13/23 (!) 155/59  07/16/23 (!) 160/90    Physical Exam Vitals and nursing note reviewed.  Constitutional:      General: She is  not in acute distress.    Appearance: She is well-developed.  HENT:     Head: Normocephalic and atraumatic.  Cardiovascular:     Rate and Rhythm: Normal rate and regular rhythm.  Pulmonary:     Effort: Pulmonary effort is normal. No respiratory distress.     Breath sounds: No wheezing or rhonchi.  Musculoskeletal:     Cervical back: Normal range of motion.     Right lower leg: No edema.     Left lower leg: No edema.  Lymphadenopathy:     Cervical: No cervical adenopathy.  Skin:    General: Skin is warm and dry.     Capillary Refill: Capillary refill takes less than 2 seconds.     Findings: No rash.  Neurological:     General: No focal deficit present.     Mental Status: She is alert and oriented to person, place, and time.  Psychiatric:        Mood and Affect: Mood normal.        Behavior: Behavior normal.    Diabetic Foot Exam - Simple   Simple Foot Form Diabetic Foot exam was performed with the following findings: Yes 11/14/2023  9:50 AM  Visual Inspection See comments: Yes Sensation Testing Pulse Check Posterior Tibialis and Dorsalis pulse intact bilaterally: Yes Comments Thick left great toe nail with mild erythema      Wt Readings from Last 3 Encounters:  11/14/23 286 lb (129.7 kg)  08/13/23 284 lb 1.6 oz (128.9 kg)  07/16/23 283 lb 6 oz (128.5 kg)    BP 122/78   Pulse 61   Temp 97.6 F (36.4 C) (Oral)   Resp 18   Ht 5' 4 (1.626 m)   Wt 286 lb (129.7 kg)   SpO2 97%   BMI 49.09 kg/m   Assessment and Plan:  Problem List Items Addressed This Visit       Unprioritized   Essential (primary) hypertension - Primary (Chronic)   Blood pressure is well controlled on Benicar  and hydrochlorothiazide. No medication side effects  noted. Plan to continue current medications.       Primary cancer of right upper lobe of lung (HCC) (Chronic)   Doing well, CT yesterday showed no disease progression. She continues on Tagrisso .      Type II diabetes mellitus  with complication (HCC) (Chronic)   Blood sugars have been stable.  No hypoglycemic events since last visit. Currently medications are Mounjaro . Last visit medical regimen changes were none. Lab Results  Component Value Date   HGBA1C 6.2 (A) 07/11/2023  A1C today =  6.3.  will continue same dose. She can not do urine samples due to her chemotherapy drug.       Relevant Orders   POCT glycosylated hemoglobin (Hb A1C) (Completed)   Other Visit Diagnoses       Long-term current use of injectable noninsulin antidiabetic medication         Pain in joint of left shoulder       and other joints and muscles while sleeping continue Tylenol  add flexeril  5-10 mg qhs       Return in about 6 months (around 05/13/2024) for TOC - DM, HTN  Dr. Lemon.    Leita HILARIO Adie, MD [facility] [place]

## 2023-11-14 NOTE — Assessment & Plan Note (Addendum)
 Blood sugars have been stable.  No hypoglycemic events since last visit. Currently medications are Mounjaro . Last visit medical regimen changes were none. Lab Results  Component Value Date   HGBA1C 6.2 (A) 07/11/2023  A1C today =  6.3.  will continue same dose. She can not do urine samples due to her chemotherapy drug.

## 2023-11-14 NOTE — Assessment & Plan Note (Signed)
 Doing well, CT yesterday showed no disease progression. She continues on Tagrisso .

## 2023-11-20 ENCOUNTER — Inpatient Hospital Stay: Attending: Internal Medicine

## 2023-11-20 ENCOUNTER — Encounter: Payer: Self-pay | Admitting: Internal Medicine

## 2023-11-20 ENCOUNTER — Inpatient Hospital Stay (HOSPITAL_BASED_OUTPATIENT_CLINIC_OR_DEPARTMENT_OTHER): Admitting: Internal Medicine

## 2023-11-20 VITALS — BP 139/61 | HR 72 | Temp 98.5°F | Resp 18 | Ht 64.0 in | Wt 285.5 lb

## 2023-11-20 DIAGNOSIS — N183 Chronic kidney disease, stage 3 unspecified: Secondary | ICD-10-CM | POA: Insufficient documentation

## 2023-11-20 DIAGNOSIS — Z7985 Long-term (current) use of injectable non-insulin antidiabetic drugs: Secondary | ICD-10-CM | POA: Diagnosis not present

## 2023-11-20 DIAGNOSIS — E1122 Type 2 diabetes mellitus with diabetic chronic kidney disease: Secondary | ICD-10-CM | POA: Insufficient documentation

## 2023-11-20 DIAGNOSIS — E559 Vitamin D deficiency, unspecified: Secondary | ICD-10-CM

## 2023-11-20 DIAGNOSIS — Z90710 Acquired absence of both cervix and uterus: Secondary | ICD-10-CM | POA: Diagnosis not present

## 2023-11-20 DIAGNOSIS — C3411 Malignant neoplasm of upper lobe, right bronchus or lung: Secondary | ICD-10-CM

## 2023-11-20 DIAGNOSIS — Z803 Family history of malignant neoplasm of breast: Secondary | ICD-10-CM | POA: Diagnosis not present

## 2023-11-20 DIAGNOSIS — Z79899 Other long term (current) drug therapy: Secondary | ICD-10-CM | POA: Diagnosis not present

## 2023-11-20 DIAGNOSIS — I129 Hypertensive chronic kidney disease with stage 1 through stage 4 chronic kidney disease, or unspecified chronic kidney disease: Secondary | ICD-10-CM | POA: Diagnosis not present

## 2023-11-20 DIAGNOSIS — E049 Nontoxic goiter, unspecified: Secondary | ICD-10-CM | POA: Diagnosis not present

## 2023-11-20 LAB — CBC WITH DIFFERENTIAL (CANCER CENTER ONLY)
Abs Immature Granulocytes: 0.02 K/uL (ref 0.00–0.07)
Basophils Absolute: 0 K/uL (ref 0.0–0.1)
Basophils Relative: 1 %
Eosinophils Absolute: 0.2 K/uL (ref 0.0–0.5)
Eosinophils Relative: 5 %
HCT: 41.9 % (ref 36.0–46.0)
Hemoglobin: 13.6 g/dL (ref 12.0–15.0)
Immature Granulocytes: 1 %
Lymphocytes Relative: 22 %
Lymphs Abs: 0.9 K/uL (ref 0.7–4.0)
MCH: 27.9 pg (ref 26.0–34.0)
MCHC: 32.5 g/dL (ref 30.0–36.0)
MCV: 86 fL (ref 80.0–100.0)
Monocytes Absolute: 0.5 K/uL (ref 0.1–1.0)
Monocytes Relative: 12 %
Neutro Abs: 2.4 K/uL (ref 1.7–7.7)
Neutrophils Relative %: 59 %
Platelet Count: 183 K/uL (ref 150–400)
RBC: 4.87 MIL/uL (ref 3.87–5.11)
RDW: 14.1 % (ref 11.5–15.5)
WBC Count: 4 K/uL (ref 4.0–10.5)
nRBC: 0 % (ref 0.0–0.2)

## 2023-11-20 LAB — VITAMIN D 25 HYDROXY (VIT D DEFICIENCY, FRACTURES): Vit D, 25-Hydroxy: 50.58 ng/mL (ref 30–100)

## 2023-11-20 LAB — CMP (CANCER CENTER ONLY)
ALT: 15 U/L (ref 0–44)
AST: 24 U/L (ref 15–41)
Albumin: 3.5 g/dL (ref 3.5–5.0)
Alkaline Phosphatase: 55 U/L (ref 38–126)
Anion gap: 9 (ref 5–15)
BUN: 22 mg/dL (ref 8–23)
CO2: 27 mmol/L (ref 22–32)
Calcium: 9.3 mg/dL (ref 8.9–10.3)
Chloride: 102 mmol/L (ref 98–111)
Creatinine: 1.19 mg/dL — ABNORMAL HIGH (ref 0.44–1.00)
GFR, Estimated: 47 mL/min — ABNORMAL LOW (ref >60)
Glucose, Bld: 118 mg/dL — ABNORMAL HIGH (ref 70–99)
Potassium: 3.9 mmol/L (ref 3.5–5.1)
Sodium: 138 mmol/L (ref 135–145)
Total Bilirubin: 0.7 mg/dL (ref 0.0–1.2)
Total Protein: 7.4 g/dL (ref 6.5–8.1)

## 2023-11-20 LAB — MAGNESIUM: Magnesium: 1.8 mg/dL (ref 1.7–2.4)

## 2023-11-20 NOTE — Progress Notes (Signed)
 CT neck and chest 11/13/23.  Would like to discuss tagrisso  going from 40 to 20. Can you post date rx for future date?

## 2023-11-20 NOTE — Assessment & Plan Note (Addendum)
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 Osimertinib  80 mg once a day [07/31/2020] but currently on Osimetinib 40 mg/day [severe fatigue- since July 2023]; SEP 16th, 2025- CT CHEST/NECK- Essentially stable exam;  Redemonstration of irregular solid 1.4 x 1.8 cm mass in the right upper lobe, essentially similar to the prior study. No new lung mass or consolidation. No new ymphadenopathy.  JUNE 2025- Echo-EFis 60 to 65% [Dr.Etang] left ventricle has normal function   # Continue osimertinib  at 40 mg/day  no overt progression of disease [see above]; clinically-  stable.  Discussed at length the reason I would not further dose reduce her osimertinib .  # # Myalgias- ? Etiology- recommend doubling up on MVT with vit D- 10000- stable/improved.  Okay to take iron/B12.    # Fatigue: Poor tolerance to care program.  Likley sec to osimertinib . Continue  decreased the dose to 40 mg/day. Will not recommend any further dose reductions.   # CKD stage III monitor closely.  Recommend continued hydration control of blood pressure/diabetes/Celebrex . stable.  # INCIDENTAL Left sublingual fullness/ Incidental large thyroid  goiter/nodule noted- s/p- tongue mass/thyroid  nodule- OCT 2023- CT neck-stable.Monitor for now.-   stable.  #Chronic joint pains-continue Celebrex  every other day [see below]; Tylenol  as needed for pain- stable.  #Diabetes /type II -PBF- BG- 116; currently on Munjauro/ GLP-1 agonist- stable.  # IV access: PIV  # DISPOSITION:   # follow up in 4  months MD; labs- cbc/cmp/mag; iron studies; ferritin; B12 levels- vit D 25-OH- -Dr.B   # 25 minutes face-to-face with the patient discussing the above plan of care; more than 50% of time spent on prognosis/ natural history; counseling and coordination.

## 2023-11-20 NOTE — Progress Notes (Signed)
 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  [Dr.Byrnett & Dr.Fleming]; May 2022-MRI brain-NEG.   # MAY 11th, 2022- carbo-alimta  x1 cycle; discontinued; June 6th, 2022-osimertinib  80 mg a day  #Thyroid  -enlargement; recommend biopsy per radiology; patient /husband has been reluctant  # Incidental-out of place Gastric band [since 2014] gastric band.  Asymptomatic monitor for now  # NGS/MOLECULAR TESTS:POSITIVE for EGFR mutation deletion 19    # PALLIATIVE CARE EVALUATION:  # PAIN MANAGEMENT:    DIAGNOSIS: Lung cancer  STAGE:   IIIC      ;  GOALS:Palliatve  CURRENT/MOST RECENT THERAPY : Osiemertinib      Primary cancer of right upper lobe of lung (HCC)  06/30/2020 Initial Diagnosis   Primary cancer of right upper lobe of lung (HCC)   06/30/2020 Cancer Staging   Staging form: Lung, AJCC 8th Edition - Clinical: Stage IIIC (cT4, cN3, cM0) - Signed by Rennie Cindy SAUNDERS, MD on 06/30/2020 Histopathologic type: Adenocarcinoma, NOS   07/05/2020 - 07/05/2020 Chemotherapy   Patient is on Treatment Plan : LUNG CARBOplatin  / Pemetrexed  / Pembrolizumab q21d Induction x 4 cycles / Maintenance Pemetrexed  + Pembrolizumab      HISTORY OF PRESENTING ILLNESS:  She is accompanied by husband.  Ambulating independently.   Megan Shields 78 y.o.  female  Stage IV/adenocarcinoma the lung EGFR mutated-currently on osimertinib  is here for follow-up/ and review the results of  the CT scan.  Pt has have chronic mild dyspnea- not any worse.  Otherwise denies any worsening cough.  Admits to compliance with her Tagrisso .    Denies any chest pain. Mild chronic Swelling in the legs. No nausea no vomiting .  No diarrhea.  Chronic joint pains.   Review of Systems  Constitutional:  Positive for malaise/fatigue. Negative for chills, diaphoresis, fever and weight loss.  HENT:  Negative for nosebleeds and sore throat.   Eyes:  Negative for double vision.  Respiratory:  Negative for hemoptysis, sputum production and wheezing.   Cardiovascular:  Negative for chest pain, palpitations, orthopnea and leg swelling.  Gastrointestinal:  Negative for abdominal pain, blood in stool, constipation, diarrhea, heartburn, melena, nausea and vomiting.  Genitourinary:  Negative for dysuria, frequency and urgency.  Musculoskeletal:  Negative for back pain and joint pain.  Neurological:  Negative for dizziness, tingling, focal weakness, weakness and headaches.  Endo/Heme/Allergies:  Does not bruise/bleed easily.  Psychiatric/Behavioral:  Negative for depression. The patient is not nervous/anxious and does not have insomnia.      MEDICAL HISTORY:  Past Medical History:  Diagnosis Date   Anemia    vitamin d  deficiency   Arthritis    Chronic cough    Depression    Diabetes mellitus without complication (HCC)    Dyspnea    Hypertension    ILD (interstitial lung disease) (HCC)    Obesity    BMI 49.44   Pneumonia    Pneumonia due to 2019 novel coronavirus 2022   community acquired also. covid positive May 2022   PONV (postoperative nausea and vomiting)    Primary cancer of right upper  lobe of lung (HCC) 06/30/2020   Rosacea    Shoulder contusion 11/22/2015   Vertigo    none since 3/23    SURGICAL HISTORY: Past Surgical History:  Procedure Laterality Date   BLEPHAROPLASTY Bilateral 12/19/2017   CARPAL TUNNEL RELEASE Bilateral 1985   CATARACT EXTRACTION W/PHACO Right 05/22/2022    Procedure: CATARACT EXTRACTION PHACO AND INTRAOCULAR LENS PLACEMENT (IOC) RIGHT DIABETIC;  Surgeon: Mittie Gaskin, MD;  Location: [facility];  Service: Ophthalmology;  Laterality: Right;  3.79 0:41.1   CATARACT EXTRACTION W/PHACO Left 06/12/2022   Procedure: CATARACT EXTRACTION PHACO AND INTRAOCULAR LENS PLACEMENT (IOC) LEFT DIABETIC  6.12  00:46.2;  Surgeon: Mittie Gaskin, MD;  Location: [facility];  Service: Ophthalmology;  Laterality: Left;  Diabetic   CHOLECYSTECTOMY     COLONOSCOPY WITH PROPOFOL  N/A 03/07/2017   Procedure: COLONOSCOPY WITH PROPOFOL ;  Surgeon: Therisa Bi, MD;  Location: [facility] END[facility];  Service: Gastroenterology;  Laterality: N/A;   DILATION AND CURETTAGE OF UTERUS  12/2011   benign endometrial polyp   HALLUX VALGUS AUSTIN Right 09/02/2014   Procedure: HALLUX VALGUS AUSTIN;  Surgeon: Eva Gay, DPM;  Location: [facility] ORS;  Service: Podiatry;  Laterality: Right;   HERNIA REPAIR     JOINT REPLACEMENT Bilateral    TKR   LAPAROSCOPIC GASTRIC BANDING  2010   Lap Band   LAPAROSCOPIC TOTAL HYSTERECTOMY  12/23/2019   lymph node removed Right    TOTAL KNEE ARTHROPLASTY Bilateral 2003, 2004   TOTAL KNEE ARTHROPLASTY Bilateral    VENTRAL HERNIA REPAIR  2008    SOCIAL HISTORY: Social History   Socioeconomic History   Marital status: Married    Spouse name: Ubaldo   Number of children: 2   Years of education: Not on file   Highest education level: Not on file  Occupational History   Occupation: Therapist, music    Comment: RETIRED  Tobacco Use   Smoking status: Never    Passive exposure: Never   Smokeless tobacco: Never  Vaping Use   Vaping status: Never Used  Substance and Sexual Activity   Alcohol use: No    Alcohol/week: 0.0 standard drinks of alcohol   Drug use: No   Sexual activity: Not Currently  Other Topics Concern   Not on file  Social History Narrative   Never smoked; no alcohol. Lives in [place] with husband . Home  maker.    Daughter lives next door. 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   Social Drivers of Health   Financial Resource Strain: Low Risk  (06/12/2023)   Overall Financial Resource Strain (CARDIA)    Difficulty of Paying Living Expenses: Not hard at all  Food Insecurity: No Food Insecurity (06/12/2023)   Hunger Vital Sign    Worried About Running Out of Food in the Last Year: Never true    Ran Out of Food in the Last Year: Never true  Transportation Needs: No Transportation Needs (06/12/2023)   PRAPARE - Administrator, Civil Service (Medical): No    Lack of Transportation (Non-Medical): No  Physical Activity: Inactive (06/12/2023)   Exercise Vital Sign    Days of Exercise per Week: 0 days    Minutes of Exercise per Session: 0 min  Stress: No Stress Concern Present (06/12/2023)   [employer] of Occupational Health - Occupational Stress Questionnaire    Feeling of Stress : Not at all  Social Connections: Moderately Isolated (06/12/2023)   Social Connection and Isolation Panel  Frequency of Communication with Friends and Family: More than three times a week    Frequency of Social Gatherings with Friends and Family: More than three times a week    Attends Religious Services: Never    Database administrator or Organizations: No    Attends Banker Meetings: Never    Marital Status: Married  Catering manager Violence: Not At Risk (06/12/2023)   Humiliation, Afraid, Rape, and Kick questionnaire    Fear of Current or Ex-Partner: No    Emotionally Abused: No    Physically Abused: No    Sexually Abused: No    FAMILY HISTORY: Family History  Problem Relation Age of Onset   Breast cancer Mother 72   Liver disease Brother        died 5   Breast cancer Paternal Aunt        3 pat aunts    ALLERGIES:  is allergic to pneumococcal vaccines, wound dressing adhesive, amoxicillin, codeine, and penicillins.  MEDICATIONS:  Current Outpatient Medications   Medication Sig Dispense Refill   acetaminophen  (TYLENOL ) 500 MG tablet Take 1,000 mg by mouth at bedtime as needed for moderate pain.     celecoxib  (CELEBREX ) 200 MG capsule TAKE 1 CAPSULE DAILY 90 capsule 0   cholecalciferol (VITAMIN D3) 25 MCG (1000 UNIT) tablet Take 1,000 Units by mouth daily.     Cranberry-Vitamin C-Probiotic (AZO CRANBERRY PO) Take 1 Capful by mouth daily.     cyclobenzaprine  (FLEXERIL ) 10 MG tablet Take 1 tablet (10 mg total) by mouth at bedtime. 30 tablet 1   FREESTYLE LITE test strip TEST BLOOD SUGAR TWICE A DAY 100 strip 6   Lancets (FREESTYLE) lancets TEST BLOOD SUGAR TWICE A DAY 100 each 6   MOUNJARO  7.5 MG/0.5ML Pen INJECT 7.5 MG UNDER THE SKIN WEEKLY 6 mL 3   olmesartan -hydrochlorothiazide (BENICAR  HCT) 40-25 MG tablet TAKE 1 TABLET DAILY 90 tablet 3   ondansetron  (ZOFRAN ) 8 MG tablet One pill every 8 hours as needed for nausea/vomitting. 40 tablet 1   OXYGEN  Inhale into the lungs at bedtime.     prochlorperazine  (COMPAZINE ) 10 MG tablet Take 1 tablet (10 mg total) by mouth every 6 (six) hours as needed for nausea or vomiting. 90 tablet 3   TAGRISSO  40 MG tablet TAKE 1 TABLET DAILY 90 tablet 0   No current facility-administered medications for this visit.    PHYSICAL EXAMINATION: ECOG PERFORMANCE STATUS: 0 - Asymptomatic  Vitals:   11/20/23 0943  BP: 139/61  Pulse: 72  Resp: 18  Temp: 98.5 F (36.9 C)  SpO2: 96%     Filed Weights   11/20/23 0943  Weight: 285 lb 8 oz (129.5 kg)       Physical Exam Constitutional:      Comments: Obese.    HENT:     Head: Normocephalic and atraumatic.     Mouth/Throat:     Pharynx: No oropharyngeal exudate.  Eyes:     Pupils: Pupils are equal, round, and reactive to light.  Cardiovascular:     Rate and Rhythm: Normal rate and regular rhythm.  Pulmonary:     Effort: No respiratory distress.     Breath sounds: No wheezing.     Comments: Clear to auscultation bilaterally. Abdominal:     General: Bowel  sounds are normal. There is no distension.     Palpations: Abdomen is soft. There is no mass.     Tenderness: There is no abdominal tenderness. There is  no guarding or rebound.  Musculoskeletal:        General: No tenderness. Normal range of motion.     Cervical back: Normal range of motion and neck supple.  Skin:    General: Skin is warm.  Neurological:     Mental Status: She is alert and oriented to person, place, and time.  Psychiatric:        Mood and Affect: Affect normal.    Erythema of the ball of the left big toe.  No discharge noted.  LABORATORY DATA:  I have reviewed the data as listed Lab Results  Component Value Date   WBC 4.0 11/20/2023   HGB 13.6 11/20/2023   HCT 41.9 11/20/2023   MCV 86.0 11/20/2023   PLT 183 11/20/2023   Recent Labs    05/13/23 1014 08/13/23 0954 11/13/23 1027 11/20/23 0946  NA 134* 137  --  138  K 4.3 4.0  --  3.9  CL 98 101  --  102  CO2 27 28  --  27  GLUCOSE 117* 114*  --  118*  BUN 23 23  --  22  CREATININE 1.32* 1.23* 1.40* 1.19*  CALCIUM  9.2 9.2  --  9.3  GFRNONAA 42* 46*  --  47*  PROT 7.3 7.6  --  7.4  ALBUMIN 3.5 3.6  --  3.5  AST 23 21  --  24  ALT 15 15  --  15  ALKPHOS 50 54  --  55  BILITOT 0.6 0.9  --  0.7    RADIOGRAPHIC STUDIES: I have personally reviewed the radiological images as listed and agreed with the findings in the report.   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 Osimertinib  80 mg once a day [07/31/2020] but currently on Osimetinib 40 mg/day [severe fatigue- since July 2023]; SEP 16th, 2025- CT CHEST/NECK- Essentially stable exam;  Redemonstration of irregular solid 1.4 x 1.8 cm mass in the right upper lobe, essentially similar to the prior study. No new lung mass or consolidation. No new ymphadenopathy.  JUNE 2025- Echo-EFis 60 to 65% [Dr.Etang] left ventricle has normal function   #  Continue osimertinib  at 40 mg/day  no overt progression of disease [see above]; clinically-  stable.  Discussed at length the reason I would not further dose reduce her osimertinib .  # # Myalgias- ? Etiology- recommend doubling up on MVT with vit D- 10000- stable/improved.  Okay to take iron/B12.    # Fatigue: Poor tolerance to care program.  Likley sec to osimertinib . Continue  decreased the dose to 40 mg/day. Will not recommend any further dose reductions.   # CKD stage III monitor closely.  Recommend continued hydration control of blood pressure/diabetes/Celebrex . stable.  # INCIDENTAL Left sublingual fullness/ Incidental large thyroid  goiter/nodule noted- s/p- tongue mass/thyroid  nodule- OCT 2023- CT neck-stable.Monitor for now.-   stable.  #Chronic joint pains-continue Celebrex  every other day [see below]; Tylenol  as needed for pain- stable.  #Diabetes /type II -PBF- BG- 116; currently on Munjauro/ GLP-1 agonist- stable.  # IV access: PIV  # DISPOSITION:   # follow up in 4  months MD; labs- cbc/cmp/mag; iron studies; ferritin; B12 levels- vit D 25-OH- -Dr.B   # 25 minutes face-to-face with the patient discussing the above plan of care; more than 50% of time spent on prognosis/ natural history; counseling and coordination.    All  questions were answered. The patient knows to call the clinic with any problems, questions or concerns.    Cindy JONELLE Joe, MD 11/20/2023 11:08 AM

## 2023-11-24 DIAGNOSIS — L820 Inflamed seborrheic keratosis: Secondary | ICD-10-CM | POA: Diagnosis not present

## 2023-11-24 DIAGNOSIS — D485 Neoplasm of uncertain behavior of skin: Secondary | ICD-10-CM | POA: Diagnosis not present

## 2023-11-24 DIAGNOSIS — L821 Other seborrheic keratosis: Secondary | ICD-10-CM | POA: Diagnosis not present

## 2023-11-25 ENCOUNTER — Other Ambulatory Visit: Payer: Self-pay | Admitting: Internal Medicine

## 2023-11-25 DIAGNOSIS — C3411 Malignant neoplasm of upper lobe, right bronchus or lung: Secondary | ICD-10-CM

## 2023-12-01 ENCOUNTER — Other Ambulatory Visit: Payer: Self-pay | Admitting: Internal Medicine

## 2023-12-01 DIAGNOSIS — M170 Bilateral primary osteoarthritis of knee: Secondary | ICD-10-CM

## 2023-12-02 NOTE — Telephone Encounter (Signed)
 Requested Prescriptions  Pending Prescriptions Disp Refills   celecoxib  (CELEBREX ) 200 MG capsule [Pharmacy Med Name: CELECOXIB  CAPS 200MG ] 90 capsule 0    Sig: TAKE 1 CAPSULE DAILY     Analgesics:  COX2 Inhibitors Failed - 12/02/2023 12:54 PM      Failed - Manual Review: Labs are only required if the patient has taken medication for more than 8 weeks.      Failed - Cr in normal range and within 360 days    Creatinine  Date Value Ref Range Status  11/20/2023 1.19 (H) 0.44 - 1.00 mg/dL Final  91/80/7985 9.06 0.60 - 1.30 mg/dL Final         Passed - HGB in normal range and within 360 days    Hemoglobin  Date Value Ref Range Status  11/20/2023 13.6 12.0 - 15.0 g/dL Final  91/88/7978 85.4 11.1 - 15.9 g/dL Final         Passed - HCT in normal range and within 360 days    HCT  Date Value Ref Range Status  11/20/2023 41.9 36.0 - 46.0 % Final   Hematocrit  Date Value Ref Range Status  10/06/2019 46.0 34.0 - 46.6 % Final         Passed - AST in normal range and within 360 days    AST  Date Value Ref Range Status  11/20/2023 24 15 - 41 U/L Final         Passed - ALT in normal range and within 360 days    ALT  Date Value Ref Range Status  11/20/2023 15 0 - 44 U/L Final         Passed - eGFR is 30 or above and within 360 days    EGFR (African American)  Date Value Ref Range Status  10/13/2012 >60  Final   GFR calc Af Amer  Date Value Ref Range Status  10/06/2019 79 >59 mL/min/1.73 Final    Comment:    **[facility] currently reports eGFR in compliance with the current**   recommendations of the [employer]. [facility] will   update reporting as new guidelines are published from the NKF-ASN   Task force.    EGFR (Non-African Amer.)  Date Value Ref Range Status  10/13/2012 >60  Final    Comment:    eGFR values <36mL/min/1.73 m2 may be an indication of chronic kidney disease (CKD). Calculated eGFR is useful in patients with stable renal function. The eGFR  calculation will not be reliable in acutely ill patients when serum creatinine is changing rapidly. It is not useful in  patients on dialysis. The eGFR calculation may not be applicable to patients at the low and high extremes of body sizes, pregnant women, and vegetarians.    GFR, Estimated  Date Value Ref Range Status  11/20/2023 47 (L) >60 mL/min Final    Comment:    (NOTE) Calculated using the CKD-EPI Creatinine Equation (2021)    eGFR  Date Value Ref Range Status  03/13/2023 42 (L) >59 mL/min/1.73 Final         Passed - Patient is not pregnant      Passed - Valid encounter within last 12 months    Recent Outpatient Visits           2 weeks ago Essential (primary) hypertension    Primary Care & Sports Medicine at [facility], Leita DEL, MD   4 months ago Type II diabetes mellitus with complication (HCC)  [facility] Primary Care & Sports Medicine at [facility], Leita DEL, MD

## 2023-12-11 DIAGNOSIS — H4311 Vitreous hemorrhage, right eye: Secondary | ICD-10-CM | POA: Diagnosis not present

## 2023-12-11 DIAGNOSIS — H43811 Vitreous degeneration, right eye: Secondary | ICD-10-CM | POA: Diagnosis not present

## 2024-01-19 ENCOUNTER — Other Ambulatory Visit: Payer: Self-pay | Admitting: Internal Medicine

## 2024-01-19 DIAGNOSIS — C3411 Malignant neoplasm of upper lobe, right bronchus or lung: Secondary | ICD-10-CM

## 2024-01-20 ENCOUNTER — Other Ambulatory Visit: Payer: Self-pay | Admitting: Internal Medicine

## 2024-01-20 DIAGNOSIS — E113293 Type 2 diabetes mellitus with mild nonproliferative diabetic retinopathy without macular edema, bilateral: Secondary | ICD-10-CM | POA: Diagnosis not present

## 2024-01-20 DIAGNOSIS — E118 Type 2 diabetes mellitus with unspecified complications: Secondary | ICD-10-CM

## 2024-01-20 DIAGNOSIS — H43811 Vitreous degeneration, right eye: Secondary | ICD-10-CM | POA: Diagnosis not present

## 2024-01-20 DIAGNOSIS — Z961 Presence of intraocular lens: Secondary | ICD-10-CM | POA: Diagnosis not present

## 2024-01-20 LAB — OPHTHALMOLOGY REPORT-SCANNED

## 2024-01-21 NOTE — Telephone Encounter (Signed)
 Please review.  KP

## 2024-01-21 NOTE — Telephone Encounter (Signed)
 Requested medication (s) are due for refill today - no  Requested medication (s) are on the active medication list -yes  Future visit scheduled -no  Last refill: 02/13/23 6ml 3RF  Notes to clinic: off protocol- provider review , has NP appointment March  Requested Prescriptions  Pending Prescriptions Disp Refills   MOUNJARO  7.5 MG/0.5ML Pen [Pharmacy Med Name: MOUNJARO  PEN 0.5ML 4'S 7.5MG ] 6 mL 3    Sig: INJECT 7.5 MG UNDER THE SKIN WEEKLY     Off-Protocol Failed - 01/21/2024 10:59 AM      Failed - Medication not assigned to a protocol, review manually.      Passed - Valid encounter within last 12 months    Recent Outpatient Visits           2 months ago Essential (primary) hypertension   [place] Primary [facility] & [facility] Medicine at [facility], Leita DEL, MD   6 months ago Type II diabetes mellitus with complication [facility])   [place] Primary Care & Sports Medicine at [facility], Leita DEL, MD                 Requested Prescriptions  Pending Prescriptions Disp Refills   MOUNJARO  7.5 MG/0.5ML Pen [Pharmacy Med Name: MOUNJARO  PEN 0.5ML 4'S 7.5MG ] 6 mL 3    Sig: INJECT 7.5 MG UNDER THE SKIN WEEKLY     Off-Protocol Failed - 01/21/2024 10:59 AM      Failed - Medication not assigned to a protocol, review manually.      Passed - Valid encounter within last 12 months    Recent Outpatient Visits           2 months ago Essential (primary) hypertension   [place] [facility] Medicine at [facility], Leita DEL, MD   6 months ago Type II diabetes mellitus with complication [facility])   [place] Primary Care & Sports Medicine at [facility], Leita DEL, MD

## 2024-03-01 DIAGNOSIS — M170 Bilateral primary osteoarthritis of knee: Secondary | ICD-10-CM

## 2024-03-02 ENCOUNTER — Other Ambulatory Visit: Payer: Self-pay

## 2024-03-02 NOTE — Telephone Encounter (Signed)
 Requested medications are due for refill today.  yes  Requested medications are on the active medications list.  yes  Last refill. 12/02/2023 #90 0 rf  Future visit scheduled.   At another clinic  Notes to clinic.  Dr. Justus listed as PCP.    Requested Prescriptions  Pending Prescriptions Disp Refills   celecoxib  (CELEBREX ) 200 MG capsule [Pharmacy Med Name: CELECOXIB  CAPS 200MG ] 90 capsule 3    Sig: TAKE 1 CAPSULE DAILY     Analgesics:  COX2 Inhibitors Failed - 03/02/2024 12:15 PM      Failed - Manual Review: Labs are only required if the patient has taken medication for more than 8 weeks.      Failed - Cr in normal range and within 360 days    Creatinine  Date Value Ref Range Status  11/20/2023 1.19 (H) 0.44 - 1.00 mg/dL Final  91/80/7985 9.06 0.60 - 1.30 mg/dL Final         Passed - HGB in normal range and within 360 days    Hemoglobin  Date Value Ref Range Status  11/20/2023 13.6 12.0 - 15.0 g/dL Final  91/88/7978 85.4 11.1 - 15.9 g/dL Final         Passed - HCT in normal range and within 360 days    HCT  Date Value Ref Range Status  11/20/2023 41.9 36.0 - 46.0 % Final   Hematocrit  Date Value Ref Range Status  10/06/2019 46.0 34.0 - 46.6 % Final         Passed - AST in normal range and within 360 days    AST  Date Value Ref Range Status  11/20/2023 24 15 - 41 U/L Final         Passed - ALT in normal range and within 360 days    ALT  Date Value Ref Range Status  11/20/2023 15 0 - 44 U/L Final         Passed - eGFR is 30 or above and within 360 days    EGFR (African American)  Date Value Ref Range Status  10/13/2012 >60  Final   GFR calc Af Amer  Date Value Ref Range Status  10/06/2019 79 >59 mL/min/1.73 Final    Comment:    **[facility] currently reports eGFR in compliance with the current**   recommendations of the [employer]. [facility] will   update reporting as new guidelines are published from the NKF-ASN   Task force.    EGFR  (Non-African Amer.)  Date Value Ref Range Status  10/13/2012 >60  Final    Comment:    eGFR values <53mL/min/1.73 m2 may be an indication of chronic kidney disease (CKD). Calculated eGFR is useful in patients with stable renal function. The eGFR calculation will not be reliable in acutely ill patients when serum creatinine is changing rapidly. It is not useful in  patients on dialysis. The eGFR calculation may not be applicable to patients at the low and high extremes of body sizes, pregnant women, and vegetarians.    GFR, Estimated  Date Value Ref Range Status  11/20/2023 47 (L) >60 mL/min Final    Comment:    (NOTE) Calculated using the CKD-EPI Creatinine Equation (2021)    eGFR  Date Value Ref Range Status  03/13/2023 42 (L) >59 mL/min/1.73 Final         Passed - Patient is not pregnant      Passed - Valid encounter within last 12 months  Recent Outpatient Visits           3 months ago Essential (primary) hypertension   [place] Primary Care & Sports Medicine at [facility], Leita DEL, MD   7 months ago Type II diabetes mellitus with complication [facility])   [place] Primary Care & Sports Medicine at [facility], Leita DEL, MD

## 2024-03-07 ENCOUNTER — Other Ambulatory Visit: Payer: Self-pay | Admitting: Internal Medicine

## 2024-03-07 DIAGNOSIS — C3411 Malignant neoplasm of upper lobe, right bronchus or lung: Secondary | ICD-10-CM

## 2024-03-08 ENCOUNTER — Encounter: Payer: Self-pay | Admitting: Internal Medicine

## 2024-03-15 ENCOUNTER — Ambulatory Visit (INDEPENDENT_AMBULATORY_CARE_PROVIDER_SITE_OTHER): Admitting: Student

## 2024-03-15 ENCOUNTER — Ambulatory Visit

## 2024-03-15 ENCOUNTER — Encounter: Payer: Self-pay | Admitting: Student

## 2024-03-15 ENCOUNTER — Ambulatory Visit
Admission: EM | Admit: 2024-03-15 | Discharge: 2024-03-15 | Disposition: A | Discharge status: Home / Self Care | Source: Home / Self Care | Attending: Family Medicine | Admitting: Family Medicine

## 2024-03-15 ENCOUNTER — Encounter: Payer: Self-pay | Admitting: Internal Medicine

## 2024-03-15 ENCOUNTER — Ambulatory Visit: Payer: Self-pay

## 2024-03-15 ENCOUNTER — Telehealth: Payer: Self-pay | Admitting: Internal Medicine

## 2024-03-15 VITALS — BP 144/84 | HR 108 | Temp 98.2°F | Ht 64.0 in | Wt 284.0 lb

## 2024-03-15 DIAGNOSIS — Z85118 Personal history of other malignant neoplasm of bronchus and lung: Secondary | ICD-10-CM

## 2024-03-15 DIAGNOSIS — R238 Other skin changes: Secondary | ICD-10-CM

## 2024-03-15 DIAGNOSIS — N764 Abscess of vulva: Secondary | ICD-10-CM | POA: Diagnosis not present

## 2024-03-15 DIAGNOSIS — R059 Cough, unspecified: Secondary | ICD-10-CM | POA: Insufficient documentation

## 2024-03-15 DIAGNOSIS — R051 Acute cough: Secondary | ICD-10-CM | POA: Diagnosis not present

## 2024-03-15 MED ORDER — PROMETHAZINE-DM 6.25-15 MG/5ML PO SYRP: 5.0000 mL | ORAL_SOLUTION | Freq: Four times a day (QID) | ORAL | 0 refills | Status: AC | PRN

## 2024-03-15 MED ORDER — DOXYCYCLINE HYCLATE 100 MG PO CAPS: 100.0000 mg | ORAL_CAPSULE | Freq: Two times a day (BID) | ORAL | 0 refills | Status: AC

## 2024-03-15 MED ORDER — CEFDINIR 300 MG PO CAPS: 300.0000 mg | ORAL_CAPSULE | Freq: Two times a day (BID) | ORAL | 0 refills | Status: AC

## 2024-03-15 MED ORDER — BENZONATATE 100 MG PO CAPS: 100.0000 mg | ORAL_CAPSULE | Freq: Three times a day (TID) | ORAL | 0 refills | Status: AC

## 2024-03-15 MED ORDER — MUPIROCIN 2 % EX OINT: 1.0000 | TOPICAL_OINTMENT | Freq: Two times a day (BID) | CUTANEOUS | 0 refills | Status: AC

## 2024-03-15 NOTE — Discharge Instructions (Addendum)
 We decided against getting a chest xray today and will cover you for possible pneumonia. Follow up as scheduled with your oncologist/pulmonologist next week.     You have an infected skin tear. Apply the antibiotic ointment and keep the area covered and dry.  Take the antibiotics as prescribed.

## 2024-03-15 NOTE — Telephone Encounter (Signed)
 Before patient left today, pt scheduled to see Dr. Lemon again, and would like her to be her Provider since Dr. Justus Is now gone. Patient would also like a call to see if she needs to come in to discuss increasing her Mounjaro  medication?

## 2024-03-15 NOTE — Assessment & Plan Note (Signed)
 2 cm x 1 cm abscess of the left vulva with small amount of bleeding, suspect need I&D will send to UC.

## 2024-03-15 NOTE — Telephone Encounter (Signed)
Noted  Pt has an appt  KP 

## 2024-03-15 NOTE — ED Triage Notes (Signed)
 Pt was seen by her PCP today and advised to be seen here for an abscess near her vaginal area x 2 weeks. Pt states the abscess has been bleeding for 1 week. She was also advised to get a chest xray for a cough she has x 3 weeks.

## 2024-03-15 NOTE — Progress Notes (Signed)
 "  Established Patient Office Visit  Subjective   Patient ID: Megan Shields, female    DOB: 1945-08-09  Age: 79 y.o. MRN: [id number]  Chief Complaint  Patient presents with   Cyst    Inner thigh , x 3 weeks, recently started to bleed, tender, no fever     Megan Shields is a 79 y.o. person with medical hx listed below who presents today for cough that started on 12/27/205 with mild fever and nausea. Still has a linger cough, feels she cannot clear her sputum. Is not currently taking anything for the cough.   Boil on the left buttock near the cleft that has been bleeding. Thinks there are 2 areas of swelling. Started about 1 week ago. She is unable to see the area. Having mild pain due to pants rubbing against the area.   Patient Active Problem List   Diagnosis Date Noted   Abscess, vulva 03/15/2024   Cough 03/15/2024   CKD stage 3b, GFR 30-44 ml/min (HCC) 07/11/2023   AVB (atrioventricular block) 07/11/2023   Obesity, morbid (HCC) 03/13/2023   Secondary and unspecified malignant neoplasm of lymph nodes of head, 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   ILD (interstitial lung disease) (HCC) 01/30/2022   Enlarged thyroid  gland 11/26/2021   Drug-induced neutropenia 07/17/2020   Primary cancer of right upper lobe of lung (HCC) 06/30/2020   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   Vitamin D  deficiency 10/06/2019   Rosacea 04/08/2019   Hyperlipidemia associated with type 2 diabetes mellitus (HCC) 04/08/2019   Vertigo 04/07/2018   Tubular adenoma of colon 03/10/2017   Tinea pedis of left foot 11/22/2016   Type II diabetes mellitus with complication (HCC) 07/06/2015   Essential (primary) hypertension 08/01/2014   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   Arthritis of knee, degenerative 08/01/2014   Low back strain 08/01/2014   Status post bariatric surgery 08/01/2014      ROS Refer to HPI    Objective:     Outpatient Encounter Medications as of 03/15/2024  Medication Sig  Note   acetaminophen  (TYLENOL ) 500 MG tablet Take 1,000 mg by mouth at bedtime as needed for moderate pain.    celecoxib  (CELEBREX ) 200 MG capsule TAKE 1 CAPSULE DAILY    cholecalciferol (VITAMIN D3) 25 MCG (1000 UNIT) tablet Take 1,000 Units by mouth daily.    Cranberry-Vitamin C-Probiotic (AZO CRANBERRY PO) Take 1 Capful by mouth daily.    cyclobenzaprine  (FLEXERIL ) 10 MG tablet Take 1 tablet (10 mg total) by mouth at bedtime.    FREESTYLE LITE test strip TEST BLOOD SUGAR TWICE A DAY    Lancets (FREESTYLE) lancets TEST BLOOD SUGAR TWICE A DAY    olmesartan -hydrochlorothiazide (BENICAR  HCT) 40-25 MG tablet TAKE 1 TABLET DAILY    ondansetron  (ZOFRAN ) 8 MG tablet One pill every 8 hours as needed for nausea/vomitting.    OXYGEN  Inhale into the lungs at bedtime. 06/12/2023: 2.5 liters by nasal cannula   prochlorperazine  (COMPAZINE ) 10 MG tablet TAKE 1 TABLET EVERY 6 HOURS AS NEEDED FOR NAUSEA OR VOMITING    TAGRISSO  40 MG tablet TAKE 1 TABLET DAILY    tirzepatide  (MOUNJARO ) 7.5 MG/0.5ML Pen INJECT 7.5 MG UNDER THE SKIN WEEKLY    No facility-administered encounter medications on file as of 03/15/2024.    BP (!) 144/84   Pulse (!) 108   Temp 98.2 F (36.8 C) (Oral)   Ht 5' 4 (1.626 m)   Wt 284 lb (128.8 kg)   SpO2 95%   BMI 48.75  kg/m  BP Readings from Last 3 Encounters:  03/15/24 (!) 144/84  11/20/23 139/61  11/14/23 122/78    Physical Exam Constitutional:      Appearance: Normal appearance.  HENT:     Right Ear: Tympanic membrane normal.     Left Ear: Tympanic membrane normal.     Nose: Rhinorrhea present. No congestion.     Mouth/Throat:     Mouth: Mucous membranes are moist.     Pharynx: Oropharynx is clear.  Cardiovascular:     Rate and Rhythm: Normal rate and regular rhythm.  Pulmonary:     Breath sounds: No rhonchi or rales.     Comments: Increased work of breathing, breath sounds are quiet Abdominal:     General: Abdomen is flat. Bowel sounds are normal. There is no  distension.     Palpations: Abdomen is soft.     Tenderness: There is no abdominal tenderness.  Musculoskeletal:        General: Normal range of motion.     Right lower leg: No edema.     Left lower leg: No edema.  Skin:    General: Skin is warm and dry.     Capillary Refill: Capillary refill takes less than 2 seconds.  Neurological:     General: No focal deficit present.     Mental Status: She is alert and oriented to person, place, and time.  Psychiatric:        Mood and Affect: Mood normal.        Behavior: Behavior normal.        03/15/2024   10:00 AM 11/20/2023   10:00 AM 11/14/2023    9:42 AM  Depression screen PHQ 2/9  Decreased Interest 0 0 0  Down, Depressed, Hopeless 0 0 0  PHQ - 2 Score 0 0 0       03/15/2024   10:00 AM 07/11/2023    9:46 AM 03/13/2023    8:29 AM 10/04/2022    8:37 AM  GAD 7 : Generalized Anxiety Score  Nervous, Anxious, on Edge 0 0 0 0  Control/stop worrying 0 0 0 0  Worry too much - different things  0 0 0  Trouble relaxing  0 0 0  Restless  0 0 0  Easily annoyed or irritable  0 0 0  Afraid - awful might happen  0 0 0  Total GAD 7 Score  0 0 0  Anxiety Difficulty  Not difficult at all Not difficult at all Not difficult at all    No results found for any visits on 03/15/24.    The 10-year ASCVD risk score (Arnett DK, et al., 2019) is: 48%    Assessment & Plan:  Abscess, vulva Assessment & Plan: 2 cm x 1 cm abscess of the left vulva with small amount of bleeding, suspect need I&D will send to UC.    Acute cough Assessment & Plan: Cough and URI symptoms fro the past 3 weeks, continues to feel tired, malaise, with productive cough. Has history of RUL adenocarcioma, recommend getting CXR to evaluate for secondary bacterial infection. She is going to UC for vulvar abscess and will discuss with provider there.       Harlene Saddler, MD "

## 2024-03-15 NOTE — Assessment & Plan Note (Signed)
 Cough and URI symptoms fro the past 3 weeks, continues to feel tired, malaise, with productive cough. Has history of RUL adenocarcioma, recommend getting CXR to evaluate for secondary bacterial infection. She is going to UC for vulvar abscess and will discuss with provider there.

## 2024-03-15 NOTE — ED Provider Notes (Signed)
 " [facility]    CSN: [id number] Arrival date & time: 03/15/24  1057      History   Chief Complaint Chief Complaint  Patient presents with   Abscess    HPI Megan Shields is a 79 y.o. female.   HPI  History obtained from the patient. Megan Shields was sent to the urgent care by her PCP for:  Concern for genital abscess for the past week.  Has been putting a triple antibiotic ointment on the area.  She has been seeing some bloody discharge from the area. Says her doctor said it may need to be opened.  She wears a pad and thinks it may be rubbing against her skin.  Has had a productive cough for the past 3 weeks.  Cough gets worse at night and she is not able to sleep. She has known lung cancer in her right lung.  She has not taken anything for her cough. Notes she had the flu when her cough first started. Fever has resolved.  Endorses fatigue and decreased appetite.  She has been staying hydrated.      Past Medical History:  Diagnosis Date   Anemia    vitamin d  deficiency   Arthritis    Chronic cough    Depression    Diabetes mellitus without complication (HCC)    Dyspnea    Hypertension    ILD (interstitial lung disease) (HCC)    Obesity    BMI 49.44   Pneumonia    Pneumonia due to 2019 novel coronavirus 2022   community acquired also. covid positive May 2022   PONV (postoperative nausea and vomiting)    Primary cancer of right upper lobe of lung (HCC) 06/30/2020   Rosacea    Shoulder contusion 11/22/2015   Vertigo    none since 3/23    Patient Active Problem List   Diagnosis Date Noted   Abscess, vulva 03/15/2024   Cough 03/15/2024   CKD stage 3b, GFR 30-44 ml/min (HCC) 07/11/2023   AVB (atrioventricular block) 07/11/2023   Obesity, morbid (HCC) 03/13/2023   Secondary and unspecified malignant neoplasm of lymph nodes of head, 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   ILD (interstitial lung disease) (HCC) 01/30/2022   Enlarged thyroid   gland 11/26/2021   Drug-induced neutropenia 07/17/2020   Primary cancer of right upper lobe of lung (HCC) 06/30/2020   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   Vitamin D  deficiency 10/06/2019   Rosacea 04/08/2019   Hyperlipidemia associated with type 2 diabetes mellitus (HCC) 04/08/2019   Vertigo 04/07/2018   Tubular adenoma of colon 03/10/2017   Tinea pedis of left foot 11/22/2016   Type II diabetes mellitus with complication (HCC) 07/06/2015   Essential (primary) hypertension 08/01/2014   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   Arthritis of knee, degenerative 08/01/2014   Low back strain 08/01/2014   Status post bariatric surgery 08/01/2014    Past Surgical History:  Procedure Laterality Date   BLEPHAROPLASTY Bilateral 12/19/2017   CARPAL TUNNEL RELEASE Bilateral 1985   CATARACT EXTRACTION W/PHACO Right 05/22/2022   Procedure: CATARACT EXTRACTION PHACO AND INTRAOCULAR LENS PLACEMENT (IOC) RIGHT DIABETIC;  Surgeon: Mittie Gaskin, MD;  Location: [facility];  Service: Ophthalmology;  Laterality: Right;  3.79 0:41.1   CATARACT EXTRACTION W/PHACO Left 06/12/2022   Procedure: CATARACT EXTRACTION PHACO AND INTRAOCULAR LENS PLACEMENT (IOC) LEFT DIABETIC  6.12  00:46.2;  Surgeon: Mittie Gaskin, MD;  Location: [facility];  Service: Ophthalmology;  Laterality: Left;  Diabetic   CHOLECYSTECTOMY  COLONOSCOPY WITH PROPOFOL  N/A 03/07/2017   Procedure: COLONOSCOPY WITH PROPOFOL ;  Surgeon: Therisa Bi, MD;  Location: [facility];  Service: Gastroenterology;  Laterality: N/A;   DILATION AND CURETTAGE OF UTERUS  12/2011   benign endometrial polyp   HALLUX VALGUS AUSTIN Right 09/02/2014   Procedure: HALLUX VALGUS AUSTIN;  Surgeon: Eva Gay, DPM;  Location: [facility];  Service: Podiatry;  Laterality: Right;   HERNIA REPAIR     JOINT REPLACEMENT Bilateral    TKR   LAPAROSCOPIC GASTRIC BANDING  2010   Lap Band    LAPAROSCOPIC TOTAL HYSTERECTOMY  12/23/2019   lymph node removed Right    TOTAL KNEE ARTHROPLASTY Bilateral 2003, 2004   TOTAL KNEE ARTHROPLASTY Bilateral    VENTRAL HERNIA REPAIR  2008    OB History   No obstetric history on file.      Home Medications    Prior to Admission medications  Medication Sig Start Date End Date Taking? Authorizing Provider  acetaminophen  (TYLENOL ) 500 MG tablet Take 1,000 mg by mouth at bedtime as needed for moderate pain.    [provider]  celecoxib  (CELEBREX ) 200 MG capsule TAKE 1 CAPSULE DAILY 03/02/24   Lemon Raisin, MD  cholecalciferol (VITAMIN D3) 25 MCG (1000 UNIT) tablet Take 1,000 Units by mouth daily.    [provider]  Cranberry-Vitamin C-Probiotic (AZO CRANBERRY PO) Take 1 Capful by mouth daily.    [provider]  cyclobenzaprine  (FLEXERIL ) 10 MG tablet Take 1 tablet (10 mg total) by mouth at bedtime. 11/26/21   Justus Leita DEL, MD  FREESTYLE LITE test strip TEST BLOOD SUGAR TWICE A DAY 02/15/20   Justus Leita DEL, MD  Lancets (FREESTYLE) lancets TEST BLOOD SUGAR TWICE A DAY 02/15/20   Justus Leita DEL, MD  olmesartan -hydrochlorothiazide (BENICAR  HCT) 40-25 MG tablet TAKE 1 TABLET DAILY 08/08/23   Justus Leita DEL, MD  ondansetron  (ZOFRAN ) 8 MG tablet One pill every 8 hours as needed for nausea/vomitting. 06/30/20   Brahmanday, Govinda R, MD  OXYGEN  Inhale into the lungs at bedtime.    [provider]  prochlorperazine  (COMPAZINE ) 10 MG tablet TAKE 1 TABLET EVERY 6 HOURS AS NEEDED FOR NAUSEA OR VOMITING 03/08/24   Brahmanday, Govinda R, MD  TAGRISSO  40 MG tablet TAKE 1 TABLET DAILY 01/19/24   Dasie Tinnie MATSU, NP  tirzepatide  (MOUNJARO ) 7.5 MG/0.5ML Pen INJECT 7.5 MG UNDER THE SKIN WEEKLY 01/21/24   Justus Leita DEL, MD    Family History Family History  Problem Relation Age of Onset   Breast cancer Mother 36   Liver disease Brother        died 11   Breast cancer Paternal Aunt        3 pat  aunts    Social History Social History[1]   Allergies   Pneumococcal vaccines, Wound dressing adhesive, Amoxicillin, Codeine, and Penicillins   Review of Systems Review of Systems: negative unless otherwise stated in HPI.      Physical Exam Triage Vital Signs ED Triage Vitals  Encounter Vitals Group     BP 03/15/24 1126 (!) 140/82     Girls Systolic BP Percentile --      Girls Diastolic BP Percentile --      Boys Systolic BP Percentile --      Boys Diastolic BP Percentile --      Pulse Rate 03/15/24 1126 (!) 117     Resp 03/15/24 1126 17     Temp 03/15/24 1126 98.6 F (37 C)  Temp Source 03/15/24 1126 Oral     SpO2 03/15/24 1126 99 %     Weight --      Height --      Head Circumference --      Peak Flow --      Pain Score 03/15/24 1125 0     Pain Loc --      Pain Education --      Exclude from Growth Chart --    No data found.  Updated Vital Signs BP (!) 140/82 (BP Location: Right Arm)   Pulse (!) 117   Temp 98.6 F (37 C) (Oral)   Resp 17   SpO2 99%   Visual Acuity Right Eye Distance:   Left Eye Distance:   Bilateral Distance:    Right Eye Near:   Left Eye Near:    Bilateral Near:     Physical Exam GEN:     alert, non-toxic appearing female in no distress ***   HENT:  mucus membranes moist, oropharyngeal ***without lesions or ***erythema, no*** tonsillar hypertrophy or exudates, *** moderate erythematous edematous turbinates, ***clear nasal discharge, ***bilateral TM normal EYES:   pupils equal and reactive, ***no scleral injection or discharge NECK:  normal ROM, no ***lymphadenopathy, ***no meningismus   RESP:  no increased work of breathing, ***clear to auscultation bilaterally CVS:   regular rate ***and rhythm Skin:   warm and dry, no rash on visible skin***    UC Treatments / Results  Labs (all labs ordered are listed, but only abnormal results are displayed) Labs Reviewed - No data to display  EKG   Radiology No results  found.   Procedures Procedures (including critical care time)  Medications Ordered in UC Medications - No data to display  Initial Impression / Assessment and Plan / UC Course  I have reviewed the triage vital signs and the nursing notes.  Pertinent labs & imaging results that were available during my care of the patient were reviewed by me and considered in my medical decision making (see chart for details).     Pt is a 79 y.o. female who presents for *** weeks of cough that is not improving.  Megan Shields is  ***afebrile here without recent antipyretics. Satting ***well on room air.   On chart review, irregular solid 1.4 x 1.8 cm mass in the right upper lobe seen on CT Chest in Sept 2025.     Overall pt is  non-toxic appearing, well hydrated, without respiratory distress. Pulmonary exam is ***remarkable for rhonchi that clear with cough.  After shared decision making, we will ***not pursue chest x-ray at this time.  COVID  and influenza testing deferred due to length of symptoms. ***Home COVID testing was negative.   Treat acute bronchitis with ***steroids and antibiotics as below.  ***Promethazine  DM cough syrup given for cough and allow patient to rest.  Typical duration of symptoms discussed. Return and ED precautions given and patient voiced understanding.   Discussed MDM, treatment plan and plan for follow-up with patient who agrees with plan.    Pt is a 79 y.o. female who presents for *** days of respiratory symptoms. Megan Shields is ***afebrile here without recent antipyretics. Satting well on room air. Overall pt is ***non-toxic appearing, well hydrated, without respiratory distress. Pulmonary exam ***is unremarkable.  POC COVID and influenza panel obtained ***and was negative. ***POC strep is ***.  Strep throat culture sent.   Suspect ***viral respiratory illness. Discussed symptomatic treatment.  ***Promethazine  DM for cough. ***Atrovent  nasal spray for nasal congestion.  ***Explained  lack of efficacy of antibiotics in viral disease.  Typical duration of symptoms discussed.   Return and ED precautions given and voiced understanding. Discussed MDM, treatment plan and plan for follow-up with patient*** who agrees with plan.     Final Clinical Impressions(s) / UC Diagnoses   Final diagnoses:  Acute cough   Discharge Instructions   None    ED Prescriptions   None    PDMP not reviewed this encounter.       [1] Social History Tobacco Use   Smoking status: Never    Passive exposure: Never   Smokeless tobacco: Never  Vaping Use   Vaping status: Never Used  Substance Use Topics   Alcohol use: No    Alcohol/week: 0.0 standard drinks of alcohol   Drug use: No  "

## 2024-03-15 NOTE — Telephone Encounter (Signed)
 FYI Only or Action Required?: FYI only for provider: appointment scheduled on 1/19.  Patient was last seen in primary care on 11/14/2023 by Justus Leita DEL, MD.  Called Nurse Triage reporting Recurrent Skin Infections.  Symptoms began several weeks ago.  Interventions attempted: Rest, hydration, or home remedies.  Symptoms are: gradually worsening.  Triage Disposition: See Physician Within 24 Hours  Patient/caregiver understands and will follow disposition?: Yes         Message from Montie POUR sent at 03/15/2024  8:16 AM EST  Reason for Triage:  She has had a cough for 3 weeks; now 2 boils came up on her buttocks and 1 is bleeding. She cannot get the bleeding to stop. She is wanting an appointment.   Reason for Disposition  [1] Boil > 1/2 inch across (> 12 mm; larger than a marble) AND [2] center is soft or pus colored  Answer Assessment - Initial Assessment Questions 1. APPEARANCE of BOIL: What does the boil look like?      Unsure patient cannot see the area   2. LOCATION: Where is the boil located?      Buttocks  3. NUMBER: How many boils are there?      2  4. SIZE: How big is the boil? (e.g., inches, cm; compare to size of a coin or other object)     Marble sized   5. ONSET: When did the boil start?     X 3 weeks   6. PAIN: Is there any pain? If Yes, ask: How bad is the pain?   (Scale 1-10; or mild, moderate, severe)     Yes, burning pain mild-moderate   7. FEVER: Do you have a fever? If Yes, ask: What is it, how was it measured, and when did it start?      No   8. SOURCE: Have you been around anyone with boils or other Staph infections? Have you ever had boils before?     No   9. OTHER SYMPTOMS: Do you have any other symptoms? (e.g., shaking chills, weakness, rash elsewhere on body)     Reports weakness from Lung CA, cough    Patient called in to triage with complaints of two boils on buttocks. One is bleeding currently. Cough  x 3 weeks as well.  This has been ongoing for 3 weeks (boils) For home care, the patient is keeping the area clean and dry with antibiotic ointment.   Appointment scheduled for further evaluation; Patient agrees with the plan of care, and will reach out if symptoms worsen or persist.  Protocols used: Boil (Skin Abscess)-A-AH

## 2024-03-22 ENCOUNTER — Ambulatory Visit: Admitting: Internal Medicine

## 2024-03-22 ENCOUNTER — Other Ambulatory Visit

## 2024-03-29 ENCOUNTER — Inpatient Hospital Stay: Payer: Self-pay | Admitting: Internal Medicine

## 2024-03-29 ENCOUNTER — Inpatient Hospital Stay: Payer: Self-pay

## 2024-04-19 ENCOUNTER — Inpatient Hospital Stay

## 2024-04-19 ENCOUNTER — Inpatient Hospital Stay: Admitting: Internal Medicine

## 2024-04-26 ENCOUNTER — Encounter: Admitting: Family Medicine

## 2024-05-17 ENCOUNTER — Encounter: Admitting: Student

## 2024-06-01 ENCOUNTER — Encounter: Admitting: Student

## 2024-06-09 ENCOUNTER — Ambulatory Visit: Admitting: Student

## 2024-06-24 ENCOUNTER — Ambulatory Visit
